# Patient Record
Sex: Male | Born: 1950 | Race: Black or African American | Hispanic: No | Marital: Married | Smoking: Former smoker
Health system: Southern US, Community
[De-identification: ages and names within clinical notes are randomized; demographics above are authoritative.]

## PROBLEM LIST (undated history)

## (undated) DIAGNOSIS — N529 Male erectile dysfunction, unspecified: Secondary | ICD-10-CM

## (undated) DIAGNOSIS — T4145XA Adverse effect of unspecified anesthetic, initial encounter: Secondary | ICD-10-CM

## (undated) DIAGNOSIS — J4520 Mild intermittent asthma, uncomplicated: Secondary | ICD-10-CM

## (undated) DIAGNOSIS — K219 Gastro-esophageal reflux disease without esophagitis: Secondary | ICD-10-CM

## (undated) DIAGNOSIS — R7989 Other specified abnormal findings of blood chemistry: Secondary | ICD-10-CM

## (undated) DIAGNOSIS — N400 Enlarged prostate without lower urinary tract symptoms: Secondary | ICD-10-CM

## (undated) DIAGNOSIS — R6882 Decreased libido: Secondary | ICD-10-CM

## (undated) DIAGNOSIS — J329 Chronic sinusitis, unspecified: Secondary | ICD-10-CM

## (undated) DIAGNOSIS — R319 Hematuria, unspecified: Secondary | ICD-10-CM

## (undated) DIAGNOSIS — K4090 Unilateral inguinal hernia, without obstruction or gangrene, not specified as recurrent: Secondary | ICD-10-CM

## (undated) DIAGNOSIS — J309 Allergic rhinitis, unspecified: Secondary | ICD-10-CM

## (undated) DIAGNOSIS — M169 Osteoarthritis of hip, unspecified: Secondary | ICD-10-CM

## (undated) DIAGNOSIS — D649 Anemia, unspecified: Secondary | ICD-10-CM

## (undated) DIAGNOSIS — R5383 Other fatigue: Secondary | ICD-10-CM

## (undated) DIAGNOSIS — J189 Pneumonia, unspecified organism: Secondary | ICD-10-CM

## (undated) DIAGNOSIS — I82409 Acute embolism and thrombosis of unspecified deep veins of unspecified lower extremity: Secondary | ICD-10-CM

## (undated) DIAGNOSIS — N189 Chronic kidney disease, unspecified: Secondary | ICD-10-CM

## (undated) DIAGNOSIS — K589 Irritable bowel syndrome without diarrhea: Secondary | ICD-10-CM

## (undated) DIAGNOSIS — E291 Testicular hypofunction: Secondary | ICD-10-CM

## (undated) DIAGNOSIS — E876 Hypokalemia: Secondary | ICD-10-CM

## (undated) DIAGNOSIS — IMO0001 Reserved for inherently not codable concepts without codable children: Secondary | ICD-10-CM

## (undated) DIAGNOSIS — B029 Zoster without complications: Secondary | ICD-10-CM

## (undated) DIAGNOSIS — M545 Low back pain, unspecified: Secondary | ICD-10-CM

## (undated) DIAGNOSIS — T8859XA Other complications of anesthesia, initial encounter: Secondary | ICD-10-CM

## (undated) DIAGNOSIS — I10 Essential (primary) hypertension: Secondary | ICD-10-CM

## (undated) DIAGNOSIS — G43909 Migraine, unspecified, not intractable, without status migrainosus: Secondary | ICD-10-CM

## (undated) HISTORY — DX: Hematuria, unspecified: R31.9

## (undated) HISTORY — DX: Other fatigue: R53.83

## (undated) HISTORY — DX: Male erectile dysfunction, unspecified: N52.9

## (undated) HISTORY — DX: Reserved for inherently not codable concepts without codable children: IMO0001

## (undated) HISTORY — DX: Irritable bowel syndrome, unspecified: K58.9

## (undated) HISTORY — DX: Zoster without complications: B02.9

## (undated) HISTORY — DX: Gastro-esophageal reflux disease without esophagitis: K21.9

## (undated) HISTORY — DX: Migraine, unspecified, not intractable, without status migrainosus: G43.909

## (undated) HISTORY — PX: SINUS EXPLORATION: SHX5214

## (undated) HISTORY — DX: Decreased libido: R68.82

## (undated) HISTORY — DX: Other specified abnormal findings of blood chemistry: R79.89

## (undated) HISTORY — PX: JOINT REPLACEMENT: SHX530

## (undated) HISTORY — DX: Allergic rhinitis, unspecified: J30.9

## (undated) HISTORY — DX: Osteoarthritis of hip, unspecified: M16.9

## (undated) HISTORY — DX: Essential (primary) hypertension: I10

## (undated) HISTORY — DX: Unilateral inguinal hernia, without obstruction or gangrene, not specified as recurrent: K40.90

## (undated) HISTORY — DX: Anemia, unspecified: D64.9

## (undated) HISTORY — DX: Chronic sinusitis, unspecified: J32.9

## (undated) HISTORY — DX: Low back pain, unspecified: M54.50

## (undated) HISTORY — DX: Low back pain: M54.5

## (undated) HISTORY — DX: Mild intermittent asthma, uncomplicated: J45.20

## (undated) HISTORY — DX: Testicular hypofunction: E29.1

## (undated) HISTORY — DX: Hypokalemia: E87.6

## (undated) HISTORY — DX: Benign prostatic hyperplasia without lower urinary tract symptoms: N40.0

---

## 1999-01-29 ENCOUNTER — Ambulatory Visit (HOSPITAL_COMMUNITY): Admission: RE | Admit: 1999-01-29 | Discharge: 1999-01-29 | Payer: Self-pay

## 2002-03-16 ENCOUNTER — Emergency Department (HOSPITAL_COMMUNITY): Admission: EM | Admit: 2002-03-16 | Discharge: 2002-03-17 | Payer: Self-pay | Admitting: Emergency Medicine

## 2002-03-16 ENCOUNTER — Encounter: Payer: Self-pay | Admitting: Emergency Medicine

## 2002-03-17 ENCOUNTER — Encounter: Payer: Self-pay | Admitting: Emergency Medicine

## 2002-03-18 ENCOUNTER — Ambulatory Visit (HOSPITAL_COMMUNITY): Admission: RE | Admit: 2002-03-18 | Discharge: 2002-03-18 | Payer: Self-pay | Admitting: *Deleted

## 2002-06-18 ENCOUNTER — Emergency Department (HOSPITAL_COMMUNITY): Admission: EM | Admit: 2002-06-18 | Discharge: 2002-06-18 | Payer: Self-pay

## 2002-06-18 ENCOUNTER — Encounter: Payer: Self-pay | Admitting: Emergency Medicine

## 2004-01-24 ENCOUNTER — Ambulatory Visit: Payer: Self-pay

## 2004-05-22 ENCOUNTER — Ambulatory Visit: Payer: Self-pay

## 2007-01-01 ENCOUNTER — Emergency Department (HOSPITAL_COMMUNITY): Admission: EM | Admit: 2007-01-01 | Discharge: 2007-01-02 | Payer: Self-pay | Admitting: Emergency Medicine

## 2007-01-06 ENCOUNTER — Emergency Department (HOSPITAL_COMMUNITY): Admission: EM | Admit: 2007-01-06 | Discharge: 2007-01-06 | Payer: Self-pay | Admitting: Family Medicine

## 2007-03-18 ENCOUNTER — Ambulatory Visit (HOSPITAL_BASED_OUTPATIENT_CLINIC_OR_DEPARTMENT_OTHER): Admission: RE | Admit: 2007-03-18 | Discharge: 2007-03-18 | Payer: Self-pay | Admitting: Orthopedic Surgery

## 2007-03-28 ENCOUNTER — Inpatient Hospital Stay (HOSPITAL_COMMUNITY): Admission: EM | Admit: 2007-03-28 | Discharge: 2007-04-02 | Payer: Self-pay | Admitting: Emergency Medicine

## 2007-03-29 ENCOUNTER — Ambulatory Visit: Payer: Self-pay | Admitting: Infectious Diseases

## 2007-03-30 ENCOUNTER — Encounter (INDEPENDENT_AMBULATORY_CARE_PROVIDER_SITE_OTHER): Payer: Self-pay | Admitting: Orthopedic Surgery

## 2007-03-30 ENCOUNTER — Ambulatory Visit: Payer: Self-pay | Admitting: Vascular Surgery

## 2008-04-24 ENCOUNTER — Ambulatory Visit: Payer: Self-pay | Admitting: Family Medicine

## 2008-07-28 ENCOUNTER — Ambulatory Visit: Payer: Self-pay | Admitting: Family Medicine

## 2009-10-06 HISTORY — PX: KNEE ARTHROSCOPY: SUR90

## 2010-05-21 NOTE — Op Note (Signed)
NAMEARLESS, VINEYARD NO.:  [id number]   MEDICAL RECORD NO.:  [id number]          PATIENT TYPE:  INP   LOCATION:  5006                         FACILITY:  [facility]   PHYSICIAN:  Feliberto Gottron. Turner Daniels, M.D.   DATE OF BIRTH:  08-Jan-1950   DATE OF PROCEDURE:  03/28/2007  DATE OF DISCHARGE:                               OPERATIVE REPORT   PREOPERATIVE DIAGNOSIS:  Large right knee hematoma status post  arthroscopic chondroplasties, meniscectomies and lateral release on  March 18, 2007, by Dr. Renae Fickle and Phineas Semen, P.A.-C.  Possible  infection.   POSTOPERATIVE DIAGNOSIS:  Large right knee hematoma status post  arthroscopic chondroplasties, meniscectomies and lateral release on  March 18, 2007, by Dr. Renae Fickle and Phineas Semen, P.A.-C.  Possible  infection.   PROCEDURE:  Right knee arthroscopic evacuation of large hematoma and  fairly complete synovectomy as well as removal of chondromalacia, medial  and lateral femoral condyle consistent with either a pyarthrosis or  possibly even osteomyelitis.   SURGEON:  Feliberto Gottron. Turner Daniels, M.D.   FIRST ASSISTANT:  Skip Mayer PA-C.   ANESTHETIC:  General endotracheal.   ESTIMATED BLOOD LOSS:  The hematoma itself was probably 100 mL.   TOURNIQUET TIME:  None.   SPECIMENS:  None.   INDICATIONS FOR PROCEDURE:  A 60 year old gentleman who underwent right  knee partial arthroscopic medial and lateral meniscectomies, debridement  of chondromalacia, chondroplasties, removal loose bodies, excision of  plicas and lateral release on March 18, 2007.  He presented on March 24, 2007, with a large swollen knee and a hematoma.  They attempted aspirate  it in the office and were able to get any fluid out.  He presented again  on Friday which would be March 26, 2007, again a large, swollen knee  with a hematoma and it was recommended he ice it down and used an Ace  wrap.  During the daytime on March 27, 2007, he spiked a temperature to  102,  presented to [employer] Emergency Room and I  ended up seeing him in the emergency room around 2300 hours on March 27, 2007.  Because of the level of pain he was having, we discussed the  possibility of lancing the hematoma and at least getting half of it out  in anticipation of arthroscopic decompression the next day.  The reason  for this thinking, is this was right in the middle of a gold trauma in  woman who had pretty much every long bone in her body fractured and this  was going to require attention for at least the next few hours.  Under  sterile conditions, I  went ahead and sterilely prepped the lateral  aspect of his knee and, using a #11 blade, created a 1 cm incision and I  was able to evacuate about 50% of the liquefied and clotted blood out of  the knee, giving him a great deal of pain relief.  When he presented to  the ER, his temperature was 101.7 and this was despite the use of  Tylenol.  At this point we admitted him for IV  antibiotics with  anticipation of an arthroscopic washout the next day.  In the emergency  room, he had about a 20 degree flexion contracture, probably secondary  to the blood in his knee.  Gram stain and cultures taken around midnight  on March 27, 2007, to the March 28, 2007, were pending at the time of  this dictation.  The risks and benefits of the surgery were discussed.  The plan was to get all the blood out and place large-bore Hemovac  drains and maintain him on vancomycin until the cultures return   DESCRIPTION OF PROCEDURE:  The patient was identified by armband, taken  the operating room at [facility].  Appropriate  anesthetic monitors were attached and general endotracheal anesthesia  was induced.  Lateral post applied to the table and the right lower  extremity prepped and draped usual sterile fashion from the ankle to the  midthigh and a time-out was performed.  The 1 cm lateral incision that   had been placed the night before was then explored manually and we  squeezed out another 100 mL of dark bloody, clotted material and then  recreated the inferomedial and inferolateral peripatellar portals,  inserted the arthroscope through the inferolateral portal and a 4.2  [place] su[place] shaver through the inferomedial portal or through the  lateral 1 cm incision.  Utilizing these portals, we then removed large  amounts of blood clot from the gutters from the articulation between the  femur and the tibia itself.  It should be noted that the cartilage  around the edges of the femur was the laminating and there is the little  bit of concern on my mind for either osteomyelitis or further  chondromalacia.  Clotted blood was also removed from around the cruciate  ligaments and what remained of the menisci and, most importantly, the  suprapatellar pouch was thoroughly cleaned out.  We ran 9 liters of  normal saline solution through the knee.  Utilizing the lateral incision  placed the night before, we then placed a through-and-through Hemovac  drain going from lateral to medial and using it as a double-armed medium  Hemovac drain.  Two vertical mattress sutures were placed laterally to  seal the drain and the arthroscopic instruments were removed.  A  dressing of Xeroform, 4x4 dressing sponges, Webril and an Ace wrap was  applied.  The patient was then awakened and taken to the recovery room  without difficulty.      Feliberto Gottron. Turner Daniels, M.D.  Electronically Signed     FJR/MEDQ  D:  03/28/2007  T:  03/28/2007  Job:  [id number]   cc:   Deidre Ala, M.D.

## 2010-05-21 NOTE — Discharge Summary (Signed)
NAMEDA, AUTHEMENT NO.:  [id number]   MEDICAL RECORD NO.:  [id number]          PATIENT TYPE:  INP   LOCATION:  5006                         FACILITY:  [facility]   PHYSICIAN:  [name], M.D.   DATE OF BIRTH:  June 11, 1950   DATE OF ADMISSION:  03/27/2007  DATE OF DISCHARGE:  04/02/2007                               DISCHARGE SUMMARY   FINAL DIAGNOSES:  1. Degenerative joint disease, right knee.  2. Hemarthrosis, right knee.  3. Blood loss, anemia.  4. Gastroesophageal reflux.   PROCEDURE:  Right knee arthroscopy with evacuation of large hematoma  with fairly complete synovectomy.   SURGEON:  [name], M.D.   HISTORY OF PRESENT ILLNESS:  This is a 60 year old African American male  who was recently followed by Dr. Renae Fickle.  We did an arthroscopy on him  approximately one week prior to this admission.  The patient did develop  an effusion.  Initially, we tried to aspirate and could not be aspirated  in the office secondary to being full of congealed blood.  This  continued to enlarge and he was seen by Dr. Turner Macdonald in the emergency room  after he presented to emergency room at [facility] for his  complaint of right knee pain.  He also has significant swelling.  Because of this, Dr. Turner Macdonald saw him in the hospital.  Initially, we made  a small incision over the lateral  retinaculum site and tried to  evacuate hematoma this way.  This could not be complete, so he was  subsequently taken to the OR, where he underwent arthroscopy on March 28, 2007.  At that time, he went and did a washout with fairly complete  synovectomy as well as removal of chondromalacia of mediolateral femoral  condyles consistent with either a polyarthrosis or possibly even  osteomyelitis.  Cultures were done of the wound cultures. Also, blood  cultures were performed.  His hospital course was uneventful.  He did  have Dopplers done in lower extremity to rule out DVT, which was  done.  These were negative.  The cultures remained negative throughout his  stay, and by day #5, it continued to remain negative, both the blood  cultures and wound cultures.  He did have some fevers, but these were  low-grade at 100.3.  It came down to between 98 and 99.5.  At the time  of discharge, it was about 99.5.  He was feeling well.  There is no  increased warmth of his knee at this time.  There is no significant  effusion noted.  The portal sites were healing well.  Peripheral pulse  intact.  Neuro's were grossly intact.  He had no calf pain, no Homans  sign.  Overall, he is doing well and at this time he is ready for  discharge.  He will be discharged.  He was given Dilaudid 4 mg to take 1-  2 by mouth every 4-6 hours as needed for pain, certainly he needs no  refills.  He was given  Procardia 5/325 to take 1 or 2 by mouth every 4-6 hours as  needed for  pain; for this he needs no refills.  The patient will follow up on  Wednesday, April 08, 2007 with Dr. Renae Fickle.  He is subsequently discharged  at this time in satisfactory, stable condition.      [name], P.A.      [name], M.D.  Electronically Signed    CL/MEDQ  D:  04/02/2007  T:  04/03/2007  Job:  [id number]

## 2010-05-24 NOTE — Op Note (Signed)
NAMEDONAVAN, KERLIN         ACCOUNT NO.:  [id number]   MEDICAL RECORD NO.:  [id number]          PATIENT TYPE:  AMB   LOCATION:  NESC                         FACILITY:  [facility]   PHYSICIAN:  Deidre Ala, M.D.    DATE OF BIRTH:  08/29/1950   DATE OF PROCEDURE:  DATE OF DISCHARGE:  03/18/2007                               OPERATIVE REPORT   PREOPERATIVE DIAGNOSES:  1. Degenerative medial and lateral meniscus tears.  2. Severe tricompartmental degenerative joint disease, grade III-IV.  3. Tight lateral retinaculum.  4. 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   POSTOPERATIVE DIAGNOSES:  1. Degenerative medial and lateral meniscus tears.  2. Severe tricompartmental degenerative joint disease, grade III-IV.  3. Tight lateral retinaculum.  4. 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   PROCEDURE:  1. Right knee arthroscopy with partial medial and lateral      meniscectomy.  2. Abrasion ablation chondroplasty tricompartmental.  3. With patella osteophyte removal.  4. Lateral retinacular release.  5. Synovectomy throughout.  6. Removal loose bodies.   SURGEON:  Jearld Adjutant, M.D.   ASSISTANT:  Phineas Semen, PA-C   ANESTHESIA:  General with LMA.   CULTURES:  None.   DRAINS:  None.   ESTIMATED BLOOD LOSS:  Minimal.   TOURNIQUET TIME:  1 hour 6 minutes.   PATHOLOGIC FINDINGS AND HISTORY:  [name] level 8-9  pain with his knee.  He has some degenerative change present in the  joint.  He was advised that he may need to have a total knee  replacement.  He did not want to go there, electing to proceed with knee  arthroscopy and a clean-out procedure with full understanding that this  may not completely resolve his symptoms.  At surgery we found  significant tricompartmental DJD, III-IV throughout, with marked  synovitis, a loose body that was somewhat bound down in the tissues just  anterior to the cruciates, and also marked osteophytes inferior  pole  patella, patellofemoral groove, with trochlear DJD, and a degenerative  medial and lateral meniscus tearing that was significant.  All of this  was debrided and smoothed, a lateral release carried out, synovectomy,  removal of the loose bodies and burring down of the osteophytes.   PROCEDURE:  With adequate anesthesia obtained using LMA technique, 1 g  Ancef given IV prophylaxis, the patient was placed in the supine  position.  The right lower extremity was prepped from the malleoli to  the legholder in the standard fashion.  After standard prepping and  draping, Esmarch examination was used,  the tourniquet was let up to 50  mmHg.  Superior lateral inflow portal was made, the knee was insufflated  with normal saline with the arthroscopic pump.  Medial and lateral scope  portals were then made and the joint was thoroughly inspected.  I then  shaved the medial plica back to the sidewall and lysed the medial band.  I then extensively debrided the trochlea and posterior patella and  removed osteophytes with a small bur and used the ablator on one to  smooth.  I then exposed the medial meniscus and used  basket and shaver  to saucerize to a stable rim, especially posteriorly, and sealed with  the ablator on 1 as well as smoothing the medial femoral condyle and  medial tibial plateau.  I then removed the loose body from an expanded  medial portal bound down in the notch.  After dissecting it out, I then  reversed portals and used basket and shaver to saucerize the inner rim  lateral meniscus, smoothed with the ablator on one, took out the lateral  plica, smoothed osteophytes on the lateral trochlea and lateral femoral  condyle.  I then did an arthroscopic lateral retinacular release, after  observing significant tilt and track and cauterized bleeding points.  I  then shaved out the superior pouch.  Ablator was further used to smooth.  The knee was then irrigated through the scope, 0.5%  Marcaine with  morphine was injected in and about the joint.  The portals were left  open.  A bulky sterile compressive dressing was applied with lateral  foam pad for tamponade and Ezy Wrap placed.  The patient then having  tolerated the procedure well was awakened and taken to the recovery room  in satisfactory condition to be discharged per outpatient routine, given  Percocet for pain, and told to call the office for recheck tomorrow.           ______________________________  V. Charlesetta Shanks, M.D.     VEP/MEDQ  D:  04/20/2007  T:  04/20/2007  Job:  [id number]

## 2010-05-24 NOTE — Op Note (Signed)
[name], [name] NO.:  [id number]   MEDICAL RECORD NO.:  [id number]                   PATIENT TYPE:  AMB   LOCATION:  ENDO                                 FACILITY:  [facility]   PHYSICIAN:  Sharyn Dross., M.D.               DATE OF BIRTH:  November 22, 1950   DATE OF PROCEDURE:  03/18/2002  DATE OF DISCHARGE:                                 OPERATIVE REPORT   PREOPERATIVE DIAGNOSIS:  Noncardiac chest pains, rule out secondary to  gastroesophageal reflux disease, rule out esophagitis, rule out other  causes.   POSTOPERATIVE DIAGNOSIS:  Normal endoscopic examination.   PROCEDURE:  Esophagogastroduodenoscopy.   MEDICATIONS:  Demerol 70 mg IV, Versed 6 mg IV over a 10-minute period of  time.   INSTRUMENT USED:  Olympus video panendoscope.   ENDOSCOPIST:  Dortha Kern, M.D.   SUBJECTIVE:  See the H&P accompanying the chart at this time.   OBJECTIVE:  GENERAL:  He is a pleasant gentleman in no distress.  VITAL SIGNS:  Stable.  HEENT:  Positive for arcus senilis.  NECK:  Supple.  CHEST:  The lungs were clear to auscultation and percussion.  CARDIAC:  The heart had a regular rate and rhythm without heaves, thrills,  murmurs, or gallops.  ABDOMEN:  Soft.  There was no tenderness to palpation, no hepatosplenomegaly  noted at this time.  Bowel sounds were normal.  EXTREMITIES:  No cyanosis or edema that is present.   PLAN:  I am going to proceed with the endoscopic examination, especially in  light of his discomfort at this time.  Depending upon these results will  determine the course of therapy.   INFORMED CONSENT:  The patient was advised of the procedure, the  indications, and the risks involved.  The patient has agreed to have the  procedure performed.  The video was reviewed and consent form obtained.   PREPROCEDURE PREPARATION:  The patient was brought to [facility]  endoscopy unit.  An IV for IV sedating medication was started.  A  monitor  was placed on the patient to monitor the patient's vital signs and oxygen  saturation.  Nasal oxygen at 2 L/min. was used and after adequate sedation  was performed, the procedure was begun.   PROCEDURE NOTE:  The instrument was advanced with the patient lying in the  left lateral position without difficulty.  The oropharyngeal, the glottis,  vocal cords, and piriform sinuses appear to be within normal limits.  The  esophagus was normal without any evidence of acute inflammation,  ulcerations, hiatal hernias, or varices appreciated.   The gastric area showed a normal mucus lake without any evidence of acute  inflammation or ulcerations that were noted.  The antral area appeared to be  normal, and the pylorus was normal with good peristaltic activity.  Upon  passing through the pyloric and out, the duodenal bulb and second portion  appeared to be  within normal limits.  The instrument was retracted back at  this time, and retroflexed view of the cardia showed no evidence of a hiatal  hernia that was noted.  The Z-line appeared to be 41 cm, distal esophagus.   The instrument was retracted back into the esophagus, where again no  evidence of any inflammatory changes was appreciated at this time.  The  instrument was subsequently removed per orum without difficulty.  The  patient tolerated the procedure well.   TREATMENT:  1. To continue present therapy on the patient without change at this time.  2. To follow up with me in two weeks to review the report and the results     and to see how he is clinically doing at this time.  3. Have given a sample prescription of Nexium 40 mg to take b.i.d. during     the interim.  I will hold him on this medicine until he returns in the     office and then discontinue the medicine at that time depending upon how     he is doing.                                               Sharyn Dross., M.D.    JM/MEDQ  D:  03/18/2002  T:  03/18/2002   Job:  [id number]

## 2010-05-24 NOTE — Op Note (Signed)
   NAMEREUEL, [name] NO.:  [id number]   MEDICAL RECORD NO.:  [id number]                   PATIENT TYPE:  AMB   LOCATION:  ENDO                                 FACILITY:  [facility]   PHYSICIAN:  Sharyn Dross., M.D.               DATE OF BIRTH:  1950/05/22   DATE OF PROCEDURE:  03/18/2002  DATE OF DISCHARGE:                                 OPERATIVE REPORT   ADDENDUM:   CURRENT MEDICATIONS:  1. Zocor 20 mg daily.  2. Norvasc 10 mg daily.  3. Sodium bicarb 650 mg tablets twice daily.  4. Glipizide 5 mg tablet daily.  5. Furosemide 40 mg, 1-1/2 tablets daily.  6. Aspirin 81 mg.  7. Calcitriol 0.5 mcg caplet.  8. Colchicine 0.6 mg one tablet twice a day.  9. Potassium chloride 20 mEq.  10.      Enalapril/hydrochlorothiazide 10/25 mg tablet daily.   ALLERGIES:  Noted to be none.   PAST SURGICAL HISTORY:  No surgeries over the last year.   To the transcriptionist:  I'm sorry.  Eliminate those medications.  Never  mind, this will be a cancelled note.                                                Sharyn Dross., M.D.    JM/MEDQ  D:  03/18/2002  T:  03/18/2002  Job:  [id number]

## 2010-09-30 LAB — CBC
HCT: 26.1 — ABNORMAL LOW
HCT: 29.3 — ABNORMAL LOW
Hemoglobin: 8.8 — ABNORMAL LOW
Hemoglobin: 9.7 — ABNORMAL LOW
Hemoglobin: 9.7 — ABNORMAL LOW
MCHC: 32.9
MCHC: 33.5
MCHC: 33.8
MCV: 83.1
MCV: 84
Platelets: 314
RBC: 3.16 — ABNORMAL LOW
RBC: 3.47 — ABNORMAL LOW
RDW: 14.1
RDW: 14.3
WBC: 7.4

## 2010-09-30 LAB — I-STAT 8, (EC8 V) (CONVERTED LAB)
Chloride: 100
Glucose, Bld: 118 — ABNORMAL HIGH
Hemoglobin: 10.9 — ABNORMAL LOW
Potassium: 3.5
Sodium: 136
TCO2: 30
pH, Ven: 7.421 — ABNORMAL HIGH

## 2010-09-30 LAB — COMPREHENSIVE METABOLIC PANEL
AST: 49 — ABNORMAL HIGH
Albumin: 3 — ABNORMAL LOW
Alkaline Phosphatase: 103
CO2: 30
Chloride: 99
GFR calc Af Amer: >60
GFR calc non Af Amer: >60
Potassium: 3.4 — ABNORMAL LOW
Total Bilirubin: 0.7

## 2010-09-30 LAB — BASIC METABOLIC PANEL
CO2: 30
Chloride: 100
Creatinine, Ser: 0.76
GFR calc Af Amer: >60

## 2010-09-30 LAB — DIFFERENTIAL
Basophils Absolute: 0.1
Basophils Relative: 1
Basophils Relative: 1
Eosinophils Absolute: 0.3
Eosinophils Relative: 4
Lymphocytes Relative: 18
Monocytes Absolute: 0.7
Monocytes Absolute: 1.2 — ABNORMAL HIGH
Monocytes Relative: 10
Neutro Abs: 3.6
Neutro Abs: 6.5
Neutrophils Relative %: 66

## 2010-09-30 LAB — CULTURE, BLOOD (ROUTINE X 2)
Culture: NO GROWTH
Culture: NO GROWTH

## 2010-09-30 LAB — WOUND CULTURE

## 2010-09-30 LAB — APTT: aPTT: 39 — ABNORMAL HIGH

## 2010-09-30 LAB — POCT HEMOGLOBIN-HEMACUE
Hemoglobin: 11.3 — ABNORMAL LOW
Operator id: 268271

## 2010-09-30 LAB — POCT I-STAT CREATININE: Operator id: 294341

## 2010-10-11 LAB — BASIC METABOLIC PANEL
CO2: 29
Calcium: 8.7
Creatinine, Ser: 0.88
GFR calc Af Amer: >60
Glucose, Bld: 122 — ABNORMAL HIGH

## 2010-10-11 LAB — CBC
HCT: 36.9 — ABNORMAL LOW
Hemoglobin: 12.1 — ABNORMAL LOW
MCHC: 32.9
MCV: 84.5
RBC: 4.36
RDW: 13.4

## 2012-02-07 HISTORY — PX: COLONOSCOPY: SHX174

## 2012-02-11 ENCOUNTER — Ambulatory Visit: Payer: Self-pay | Admitting: General Surgery

## 2012-02-11 LAB — HM COLONOSCOPY

## 2012-02-12 LAB — PATHOLOGY REPORT

## 2013-06-01 LAB — LIPID PANEL
Cholesterol: 192 mg/dL (ref 0–200)
HDL: 47 mg/dL (ref 35–70)
LDL CALC: 124 mg/dL

## 2013-06-01 LAB — PSA: PSA: 1.9

## 2014-07-11 ENCOUNTER — Other Ambulatory Visit: Payer: Self-pay | Admitting: Family Medicine

## 2014-07-25 ENCOUNTER — Other Ambulatory Visit: Payer: Self-pay | Admitting: Family Medicine

## 2014-09-13 ENCOUNTER — Ambulatory Visit: Payer: Self-pay | Admitting: Obstetrics and Gynecology

## 2014-09-20 ENCOUNTER — Encounter: Payer: Self-pay | Admitting: Obstetrics and Gynecology

## 2014-09-20 ENCOUNTER — Ambulatory Visit (INDEPENDENT_AMBULATORY_CARE_PROVIDER_SITE_OTHER): Payer: PRIVATE HEALTH INSURANCE | Admitting: Obstetrics and Gynecology

## 2014-09-20 VITALS — BP 146/93 | HR 80 | Resp 18 | Ht 74.0 in | Wt 293.1 lb

## 2014-09-20 DIAGNOSIS — N400 Enlarged prostate without lower urinary tract symptoms: Secondary | ICD-10-CM | POA: Diagnosis not present

## 2014-09-20 MED ORDER — TADALAFIL 5 MG PO TABS: 5.0000 mg | ORAL_TABLET | Freq: Every day | ORAL | Status: DC

## 2014-09-20 MED ORDER — FINASTERIDE 5 MG PO TABS: 5.0000 mg | ORAL_TABLET | Freq: Every day | ORAL | Status: DC

## 2014-09-20 NOTE — Progress Notes (Signed)
09/20/2014 3:31 PM   [name] [birth year] [id number]  Referring provider: No referring provider defined for this encounter.  Chief Complaint  Patient presents with  . Benign Prostatic Hypertrophy  . Elevated PSA    HPI: Patient is a 64 year old male with a history of BPH with LUTS, ED, and microscopic hematuria seen on UA last visit.  Last seen on 04/27/14. Patient presents today for follow-up of above complaints.  1. BPH with LUTS-Patient has been well managed on Cialis 5 mg daily for approximately 1.5 years. Finasteride was added at last visit. Last PVR 16 mL's. We will recheck DRE and PSA today. Patient reports significant improvement in urgency and some improvement in frequency since beginning finasteride.  PSA history  2/13 PSA 2.1 5/14 PSA 1.8  06/01/13 PSA 1.9  2. Microscopic hematuria-  3-10 RBCs seen on last UA. Will recheck today.  3. Erectile dysfunction- Patient reports that symptoms were somewhat improved by daily Cialis but that his libido has decreased since he was started on finasteride at last visit.  PMH: Past Medical History  Diagnosis Date  . Allergic rhinitis   . Chronic sinusitis   . Hypokalemia   . Anemia   . Unilateral inguinal hernia without obstruction or gangrene   . Mild intermittent asthma   . Hypogonadism in male   . ED (erectile dysfunction)   . Fatigue   . IBS (irritable bowel syndrome)   . Migraine   . Reflux   . Lumbago   . HTN (hypertension)   . Low serum vitamin D   . Decreased libido   . Shingles   . BPH (benign prostatic hyperplasia)   . Hematuria     Surgical History: Past Surgical History  Procedure Laterality Date  . Knee arthroscopy Left 10/06/2009  . Colonoscopy  02/2012    normal    Home Medications:    Medication List       This list is accurate as of: 09/20/14  3:31 PM.  Always use your most recent med list.               albuterol 108 (90 BASE) MCG/ACT inhaler  Commonly known as:   PROVENTIL HFA;VENTOLIN HFA  Inhale into the lungs every 6 (six) hours as needed for wheezing or shortness of breath.     amLODipine 5 MG tablet  Commonly known as:  NORVASC  Take 5 mg by mouth daily.     azelastine 0.05 % ophthalmic solution  Commonly known as:  OPTIVAR  1 drop 2 (two) times daily.     Ferrous Sulfate 134 MG Tabs  Take 2 tablets by mouth 2 (two) times daily.     fexofenadine 60 MG tablet  Commonly known as:  ALLEGRA  Take 60 mg by mouth 2 (two) times daily.     finasteride 5 MG tablet  Commonly known as:  PROSCAR  1 (ONE) TABLET, ORAL, ONE A DAY BY MOUTH     fluticasone 50 MCG/ACT nasal spray  Commonly known as:  FLONASE  Place 1 spray into both nostrils daily.     KLOR-CON M20 20 MEQ tablet  Generic drug:  potassium chloride SA  TAKE 2 TABLETS BY MOUTH 2 TIMES A DAY     losartan-hydrochlorothiazide 100-25 MG per tablet  Commonly known as:  HYZAAR  Take 1 tablet by mouth daily.     montelukast 10 MG tablet  Commonly known as:  SINGULAIR  TAKE 1 TABLET BY MOUTH DAILY  predniSONE 10 MG (21) Tbpk tablet  Commonly known as:  STERAPRED UNI-PAK 21 TAB  TAKE AS DIRECTED X 6 DAYS     tadalafil 5 MG tablet  Commonly known as:  CIALIS  Take 5 mg by mouth daily as needed for erectile dysfunction.        Allergies: No Known Allergies  Family History: Family History  Problem Relation Age of Onset  . Diabetes Mother   . Heart disease Mother   . Seizures Father   . Lung disease Mother     Social History:  reports that he has quit smoking. He does not have any smokeless tobacco history on file. He reports that he does not drink alcohol. His drug history is not on file.  ROS: UROLOGY Frequent Urination?: Yes Hard to postpone urination?: No Burning/pain with urination?: No Get up at night to urinate?: Yes Leakage of urine?: No Urine stream starts and stops?: No Trouble starting stream?: No Do you have to strain to urinate?: No Blood in urine?:  No Urinary tract infection?: No Sexually transmitted disease?: No Injury to kidneys or bladder?: No Painful intercourse?: No Weak stream?: No Erection problems?: No Penile pain?: No  Gastrointestinal Nausea?: No Vomiting?: No Indigestion/heartburn?: No Diarrhea?: No Constipation?: No  Constitutional Fever: No Night sweats?: No Weight loss?: No Fatigue?: Yes  Skin Skin rash/lesions?: No Itching?: No  Eyes Blurred vision?: No Double vision?: No  Ears/Nose/Throat Sore throat?: No Sinus problems?: Yes  Hematologic/Lymphatic Swollen glands?: No Easy bruising?: No  Cardiovascular Leg swelling?: No Chest pain?: No  Respiratory Cough?: Yes Shortness of breath?: No  Endocrine Excessive thirst?: No  Musculoskeletal Back pain?: Yes Joint pain?: Yes  Neurological Headaches?: No Dizziness?: No  Psychologic Depression?: No Anxiety?: No  Physical Exam: BP 146/93 mmHg  Pulse 80  Resp 18  Ht 6\' 2"  (1.88 m)  Wt 293 lb 1.6 oz (132.949 kg)  BMI 37.62 kg/m2  Constitutional:  Alert and oriented, No acute distress. HEENT:  AT, moist mucus membranes.  Trachea midline, no masses. Cardiovascular: No clubbing, cyanosis, or edema. Respiratory: Normal respiratory effort, no increased work of breathing.  Digital rectal exam: Prostate approximately +2 in size, smooth but exam limited due to patient's anatomy Skin: No rashes, bruises or suspicious lesions. Neurologic: Grossly intact, no focal deficits, moving all 4 extremities. Psychiatric: Normal mood and affect.  Laboratory Data: Lab Results  Component Value Date   WBC 6.8 04/01/2007   HGB 8.9* 04/01/2007   HCT 26.6* 04/01/2007   MCV 84.0 04/01/2007   PLT 439* 04/01/2007    Lab Results  Component Value Date   CREATININE 0.76 04/01/2007    No results found for: PSA  No results found for: TESTOSTERONE  No results found for: HGBA1C  Urinalysis No results found for: COLORURINE, APPEARANCEUR, LABSPEC,  PHURINE, GLUCOSEU, HGBUR, BILIRUBINUR, KETONESUR, PROTEINUR, UROBILINOGEN, NITRITE, LEUKOCYTESUR  Pertinent Imaging:  Assessment & Plan:    1. BPH (benign prostatic hyperplasia)- Patient's symptoms well controlled on daily 5 mg Cialis and finasteride. Will continue and follow up in 1 year or sooner if needed. - Urinalysis, Complete - PSA  2. Erectile dysfunction-Patient doing moderately well on Cialis 5 mg daily but states he has noticed a decrease in libido since starting finasteride. Cialis 20mg  samples provided today.   3. Microscopic hematuria- 3-10 RBCs seen on previous micro-urinalysis. No blood seen today.  We discussed the differential diagnosis for microscopic hematuria including nephrolithiasis, renal or upper tract tumors, bladder stones, UTIs, or bladder tumors  as well as undetermined etiologies. Per AUA guidelines, I did recommend complete microscopic hematuria evaluation including CTU, possible urine cytology, and office cystoscopy. Patient states understanding and is in agreement with plan to pursue CT urogram and cystoscopy. -Creatinine level   Return for schedule cystoscopy; f/u for Urogram results;recheck BPH 1 year.  Herbert Moors, [facility] [address], [place], [place] [address] [phone]

## 2014-09-21 LAB — CREATININE, SERUM
CREATININE: 0.86 mg/dL (ref 0.76–1.27)
GFR calc Af Amer: 107 mL/min/{1.73_m2} (ref >59)
GFR calc non Af Amer: 92 mL/min/{1.73_m2} (ref >59)

## 2014-09-21 LAB — URINALYSIS, COMPLETE
BILIRUBIN UA: NEGATIVE
GLUCOSE, UA: NEGATIVE
Ketones, UA: NEGATIVE
Leukocytes, UA: NEGATIVE
NITRITE UA: NEGATIVE
PROTEIN UA: NEGATIVE
RBC UA: NEGATIVE
Specific Gravity, UA: 1.02 (ref 1.005–1.030)
UUROB: 0.2 mg/dL (ref 0.2–1.0)
pH, UA: 8.5 — ABNORMAL HIGH (ref 5.0–7.5)

## 2014-09-21 LAB — MICROSCOPIC EXAMINATION
BACTERIA UA: NONE SEEN
Epithelial Cells (non renal): NONE SEEN /hpf (ref 0–10)
RBC, UA: NONE SEEN /hpf (ref 0–?)

## 2014-09-21 LAB — PSA: PROSTATE SPECIFIC AG, SERUM: 1.5 ng/mL (ref 0.0–4.0)

## 2014-09-25 ENCOUNTER — Telehealth: Payer: Self-pay

## 2014-09-25 NOTE — Telephone Encounter (Signed)
-----   Message from Roda Shutters, [place] sent at 09/22/2014  2:47 PM EDT ----- Please notify patient that his PSA level was 1.5 which is within normal limits and stable since his last check.

## 2014-09-25 NOTE — Telephone Encounter (Signed)
Spoke with pt wife in reference to lab results. Wife voiced understanding.  

## 2014-10-12 ENCOUNTER — Other Ambulatory Visit: Payer: Self-pay | Admitting: Family Medicine

## 2014-10-12 ENCOUNTER — Ambulatory Visit
Admission: RE | Admit: 2014-10-12 | Discharge: 2014-10-12 | Disposition: A | Discharge status: Home / Self Care | Payer: PRIVATE HEALTH INSURANCE | Source: Ambulatory Visit | Attending: Obstetrics and Gynecology | Admitting: Obstetrics and Gynecology

## 2014-10-12 DIAGNOSIS — K429 Umbilical hernia without obstruction or gangrene: Secondary | ICD-10-CM | POA: Insufficient documentation

## 2014-10-12 DIAGNOSIS — R310 Gross hematuria: Secondary | ICD-10-CM | POA: Insufficient documentation

## 2014-10-12 DIAGNOSIS — N400 Enlarged prostate without lower urinary tract symptoms: Secondary | ICD-10-CM

## 2014-10-12 DIAGNOSIS — K4090 Unilateral inguinal hernia, without obstruction or gangrene, not specified as recurrent: Secondary | ICD-10-CM | POA: Diagnosis not present

## 2014-10-12 MED ORDER — IOHEXOL 300 MG/ML  SOLN
125.0000 mL | Freq: Once | INTRAMUSCULAR | Status: AC | PRN
  Administered 2014-10-12: 125 mL via INTRAVENOUS

## 2014-10-19 ENCOUNTER — Encounter: Payer: Self-pay | Admitting: Urology

## 2014-10-19 ENCOUNTER — Ambulatory Visit (INDEPENDENT_AMBULATORY_CARE_PROVIDER_SITE_OTHER): Payer: PRIVATE HEALTH INSURANCE | Admitting: Urology

## 2014-10-19 VITALS — BP 151/94 | HR 84 | Ht 74.0 in | Wt 285.2 lb

## 2014-10-19 DIAGNOSIS — R3129 Other microscopic hematuria: Secondary | ICD-10-CM | POA: Diagnosis not present

## 2014-10-19 LAB — URINALYSIS, COMPLETE
Bilirubin, UA: NEGATIVE
GLUCOSE, UA: NEGATIVE
KETONES UA: NEGATIVE
Leukocytes, UA: NEGATIVE
NITRITE UA: NEGATIVE
SPEC GRAV UA: 1.02 (ref 1.005–1.030)
UUROB: 0.2 mg/dL (ref 0.2–1.0)
pH, UA: 7.5 (ref 5.0–7.5)

## 2014-10-19 LAB — MICROSCOPIC EXAMINATION: Renal Epithel, UA: NONE SEEN /hpf

## 2014-10-19 MED ORDER — LIDOCAINE HCL 2 % EX GEL
1.0000 "application " | Freq: Once | CUTANEOUS | Status: AC
  Administered 2014-10-19: 1 via URETHRAL

## 2014-10-19 MED ORDER — CIPROFLOXACIN HCL 500 MG PO TABS
500.0000 mg | ORAL_TABLET | Freq: Once | ORAL | Status: AC
  Administered 2014-10-19: 500 mg via ORAL

## 2014-10-19 NOTE — Progress Notes (Signed)
    Cystoscopy Procedure Note  Patient identification was confirmed, informed consent was obtained, and patient was prepped using Betadine solution.  Lidocaine jelly was administered per urethral meatus.    Preoperative abx where received prior to procedure.     Pre-Procedure: - Inspection reveals a normal caliber ureteral meatus.  Procedure: The flexible cystoscope was introduced without difficulty - No urethral strictures/lesions are present. - Enlarged prostate  - Normal bladder neck - Bilateral ureteral orifices identified - Bladder mucosa  reveals no ulcers, tumors, or lesions - No bladder stones - No trabeculation  Retroflexion shows intravesical lobe of prostate   Post-Procedure: - Patient tolerated the procedure well   A/P:  1. Microscopic hematuria -Negative workup -Follow-up in one year for repeat UA  2. BPH Continue Cialis 5 mg and finasteride   3. Erectile dysfunction Continue Cialis 5 mg  Follow up: One year with repeat urinalysis and discussion of his BPH and erectile dysfunction symptoms

## 2014-10-19 NOTE — Addendum Note (Signed)
Addended by: Kerry Hough on: 10/19/2014 09:40 AM   Modules accepted: Level of Service

## 2014-10-20 ENCOUNTER — Other Ambulatory Visit: Payer: Self-pay | Admitting: Family Medicine

## 2014-10-21 ENCOUNTER — Other Ambulatory Visit: Payer: Self-pay | Admitting: Family Medicine

## 2014-10-24 ENCOUNTER — Telehealth: Payer: Self-pay | Admitting: Family Medicine

## 2014-10-24 NOTE — Telephone Encounter (Signed)
Pt needs refills on Amlodipine to be sent to [facility]. Pt is completely out. Pt has a CPE 10/31/14

## 2014-10-25 NOTE — Telephone Encounter (Signed)
Script sent on 10/20/14

## 2014-10-30 ENCOUNTER — Telehealth: Payer: Self-pay | Admitting: Family Medicine

## 2014-10-30 DIAGNOSIS — Z0000 Encounter for general adult medical examination without abnormal findings: Secondary | ICD-10-CM

## 2014-10-30 NOTE — Telephone Encounter (Signed)
Patient has his annual physical scheduled for in the morning, he is requesting that you print off his lab order so he can get it done before appointment time which is 11:15a

## 2014-10-31 ENCOUNTER — Encounter: Payer: Self-pay | Admitting: Family Medicine

## 2014-10-31 ENCOUNTER — Ambulatory Visit (INDEPENDENT_AMBULATORY_CARE_PROVIDER_SITE_OTHER): Payer: PRIVATE HEALTH INSURANCE | Admitting: Family Medicine

## 2014-10-31 VITALS — BP 138/88 | HR 86 | Temp 98.8°F | Resp 16 | Ht 74.0 in | Wt 285.4 lb

## 2014-10-31 DIAGNOSIS — Z23 Encounter for immunization: Secondary | ICD-10-CM | POA: Diagnosis not present

## 2014-10-31 DIAGNOSIS — Z0000 Encounter for general adult medical examination without abnormal findings: Secondary | ICD-10-CM | POA: Diagnosis not present

## 2014-10-31 NOTE — Progress Notes (Signed)
Name: [name]   MRN: [id number]    DOB: 01-10-1950   Date:10/31/2014       Progress Note  Subjective  Chief Complaint  Chief Complaint  Patient presents with  . Annual Exam    HPI  64 year old presenting for annual H&P. Baseline problems are stable  Past Medical History  Diagnosis Date  . Allergic rhinitis   . Chronic sinusitis   . Hypokalemia   . Anemia   . Unilateral inguinal hernia without obstruction or gangrene   . Mild intermittent asthma   . Hypogonadism in male   . ED (erectile dysfunction)   . Fatigue   . IBS (irritable bowel syndrome)   . Migraine   . Reflux   . Lumbago   . HTN (hypertension)   . Low serum vitamin D   . Decreased libido   . Shingles   . BPH (benign prostatic hyperplasia)   . Hematuria     Social History  Substance Use Topics  . Smoking status: Former Research scientist (life sciences)  . Smokeless tobacco: Not on file  . Alcohol Use: No     Current outpatient prescriptions:  .  albuterol (PROVENTIL HFA;VENTOLIN HFA) 108 (90 BASE) MCG/ACT inhaler, Inhale into the lungs every 6 (six) hours as needed for wheezing or shortness of breath., Disp: , Rfl:  .  amLODipine (NORVASC) 5 MG tablet, TAKE 1 TABLET BY MOUTH EVERY DAY, Disp: 30 tablet, Rfl: 0 .  azelastine (OPTIVAR) 0.05 % ophthalmic solution, 1 drop 2 (two) times daily., Disp: , Rfl:  .  Ferrous Sulfate 134 MG TABS, Take 2 tablets by mouth 2 (two) times daily., Disp: , Rfl:  .  fexofenadine (ALLEGRA) 60 MG tablet, Take 60 mg by mouth 2 (two) times daily., Disp: , Rfl:  .  finasteride (PROSCAR) 5 MG tablet, Take 1 tablet (5 mg total) by mouth daily., Disp: 30 tablet, Rfl: 12 .  fluticasone (FLONASE) 50 MCG/ACT nasal spray, Place 1 spray into both nostrils daily., Disp: , Rfl:  .  KLOR-CON M20 20 MEQ tablet, TAKE 2 TABLETS BY MOUTH 2 TIMES A DAY, Disp: 120 tablet, Rfl: 5 .  losartan-hydrochlorothiazide (HYZAAR) 100-25 MG per tablet, Take 1 tablet by mouth daily., Disp: , Rfl:  .  metaxalone  (SKELAXIN) 800 MG tablet, TAKE 1 TABLET BY MOUTH 2-3 TIMES A DAY AS NEEDED PAIN/SPASMS, Disp: , Rfl: 2 .  montelukast (SINGULAIR) 10 MG tablet, TAKE 1 TABLET BY MOUTH DAILY, Disp: 30 tablet, Rfl: 2 .  predniSONE (STERAPRED UNI-PAK 21 TAB) 5 MG (21) TBPK tablet, TAKE AS DIRECTED X 6 DAYS AFTER COMPLETING 10 MG (DISPENSE BOTH PACKS), Disp: , Rfl: 0 .  tadalafil (CIALIS) 5 MG tablet, Take 1 tablet (5 mg total) by mouth daily., Disp: 90 tablet, Rfl: 3  No Known Allergies  Review of Systems  Constitutional: Negative for fever, chills and weight loss.  HENT: Negative for congestion, hearing loss, sore throat and tinnitus.   Eyes: Negative for blurred vision, double vision and redness.  Respiratory: Negative for cough, hemoptysis and shortness of breath.   Cardiovascular: Negative for chest pain, palpitations, orthopnea, claudication and leg swelling.  Gastrointestinal: Negative for heartburn, nausea, vomiting, diarrhea, constipation and blood in stool.  Genitourinary: Negative for dysuria, urgency, frequency and hematuria.  Musculoskeletal: Negative for myalgias, back pain, joint pain, falls and neck pain.  Skin: Negative for itching.  Neurological: Positive for headaches. Negative for dizziness, tingling, tremors, focal weakness, seizures, loss of consciousness and weakness.  Endo/Heme/Allergies: Does not  bruise/bleed easily.  Psychiatric/Behavioral: Negative for depression and substance abuse. The patient is not nervous/anxious and does not have insomnia.      Objective  Filed Vitals:   10/31/14 1111  BP: 138/88  Pulse: 86  Temp: 98.8 F (37.1 C)  Resp: 16  Height: 6\' 2"  (1.88 m)  Weight: 285 lb 7 oz (129.474 kg)  SpO2: 96%     Physical Exam  Constitutional: He is oriented to person, place, and time and well-developed, well-nourished, and in no distress.  HENT:  Head: Normocephalic.  Eyes: EOM are normal. Pupils are equal, round, and reactive to light.  Neck: Normal range of  motion. Neck supple. No thyromegaly present.  Cardiovascular: Normal rate, regular rhythm and normal heart sounds.   No murmur heard. Pulmonary/Chest: Effort normal and breath sounds normal. No respiratory distress. He has no wheezes.  Abdominal: Soft. Bowel sounds are normal.  Genitourinary: Rectum normal.  Done by urologist a few months ago  Musculoskeletal: Normal range of motion. He exhibits no edema.  Lymphadenopathy:    He has no cervical adenopathy.  Neurological: He is alert and oriented to person, place, and time. No cranial nerve deficit. Gait normal. Coordination normal.  Skin: Skin is warm and dry. No rash noted.  Psychiatric: Affect and judgment normal.      Assessment & Plan  1. Need for influenza vaccination Given today - Flu Vaccine QUAD 36+ mos PF IM (Fluarix & Fluzone Quad PF)  2. Annual physical exam Annual labs

## 2014-10-31 NOTE — Telephone Encounter (Signed)
Patient informed. 

## 2014-10-31 NOTE — Telephone Encounter (Signed)
Lab slip printed and placed in file cabinet for pick up

## 2014-11-01 LAB — COMPREHENSIVE METABOLIC PANEL
ALBUMIN: 4.1 g/dL (ref 3.6–4.8)
ALK PHOS: 96 IU/L (ref 39–117)
ALT: 33 IU/L (ref 0–44)
AST: 30 IU/L (ref 0–40)
Albumin/Globulin Ratio: 1.4 (ref 1.1–2.5)
BILIRUBIN TOTAL: 0.5 mg/dL (ref 0.0–1.2)
BUN/Creatinine Ratio: 20 (ref 10–22)
BUN: 17 mg/dL (ref 8–27)
CHLORIDE: 98 mmol/L (ref 97–106)
CO2: 30 mmol/L — ABNORMAL HIGH (ref 18–29)
Calcium: 9.3 mg/dL (ref 8.6–10.2)
Creatinine, Ser: 0.85 mg/dL (ref 0.76–1.27)
GFR calc Af Amer: 107 mL/min/{1.73_m2} (ref >59)
GFR calc non Af Amer: 93 mL/min/{1.73_m2} (ref >59)
GLUCOSE: 102 mg/dL — AB (ref 65–99)
Globulin, Total: 3 g/dL (ref 1.5–4.5)
POTASSIUM: 2.9 mmol/L — AB (ref 3.5–5.2)
Sodium: 146 mmol/L — ABNORMAL HIGH (ref 136–144)
Total Protein: 7.1 g/dL (ref 6.0–8.5)

## 2014-11-01 LAB — LIPID PANEL
CHOLESTEROL TOTAL: 175 mg/dL (ref 100–199)
Chol/HDL Ratio: 4.5 ratio units (ref 0.0–5.0)
HDL: 39 mg/dL — AB (ref >39)
LDL Calculated: 116 mg/dL — ABNORMAL HIGH (ref 0–99)
TRIGLYCERIDES: 99 mg/dL (ref 0–149)
VLDL Cholesterol Cal: 20 mg/dL (ref 5–40)

## 2014-11-01 LAB — TSH: TSH: 3.33 u[IU]/mL (ref 0.450–4.500)

## 2014-11-07 ENCOUNTER — Telehealth: Payer: Self-pay | Admitting: Emergency Medicine

## 2014-11-07 NOTE — Telephone Encounter (Signed)
Spoke to patient wife. He is already on Potassium 13meq twice a day. Not sure if he is taking as prescribed. Will discuss with Mr. Tessler and call back.

## 2014-11-23 ENCOUNTER — Telehealth: Payer: Self-pay | Admitting: Family Medicine

## 2014-11-23 ENCOUNTER — Other Ambulatory Visit: Payer: Self-pay | Admitting: Family Medicine

## 2014-11-23 MED ORDER — MONTELUKAST SODIUM 10 MG PO TABS: 10.0000 mg | ORAL_TABLET | Freq: Every day | ORAL | Status: DC

## 2014-11-23 MED ORDER — LOSARTAN POTASSIUM-HCTZ 100-25 MG PO TABS: 1.0000 | ORAL_TABLET | Freq: Every day | ORAL | Status: DC

## 2014-11-23 NOTE — Telephone Encounter (Signed)
Scripts sent to [facility]

## 2014-11-23 NOTE — Telephone Encounter (Signed)
Requesting refill on Amlodipine (completely out), losartan, montelukast, and klor-con. Please send all [facility] [phone]. He is wanting a 52m supply for all of them.

## 2015-01-25 ENCOUNTER — Telehealth: Payer: Self-pay

## 2015-01-25 NOTE — Telephone Encounter (Signed)
PA for cialis 5mg  has been approved. Pt has been made aware.

## 2015-02-05 ENCOUNTER — Other Ambulatory Visit: Payer: Self-pay | Admitting: Family Medicine

## 2015-03-30 ENCOUNTER — Other Ambulatory Visit: Payer: Self-pay | Admitting: Family Medicine

## 2015-03-30 MED ORDER — AZELASTINE HCL 0.05 % OP SOLN: 1.0000 [drp] | Freq: Two times a day (BID) | OPHTHALMIC | Status: DC

## 2015-03-30 MED ORDER — FLUTICASONE PROPIONATE 50 MCG/ACT NA SUSP: 1.0000 | Freq: Every day | NASAL | Status: DC

## 2015-04-27 ENCOUNTER — Other Ambulatory Visit: Payer: Self-pay | Admitting: Orthopedic Surgery

## 2015-04-27 DIAGNOSIS — M5416 Radiculopathy, lumbar region: Secondary | ICD-10-CM

## 2015-05-03 ENCOUNTER — Ambulatory Visit
Admission: RE | Admit: 2015-05-03 | Discharge: 2015-05-03 | Disposition: A | Discharge status: Home / Self Care | Payer: PRIVATE HEALTH INSURANCE | Source: Ambulatory Visit | Attending: Orthopedic Surgery | Admitting: Orthopedic Surgery

## 2015-05-03 ENCOUNTER — Ambulatory Visit: Payer: PRIVATE HEALTH INSURANCE | Admitting: Family Medicine

## 2015-05-03 DIAGNOSIS — M5416 Radiculopathy, lumbar region: Secondary | ICD-10-CM

## 2015-05-21 ENCOUNTER — Other Ambulatory Visit: Payer: Self-pay | Admitting: Family Medicine

## 2015-05-22 ENCOUNTER — Telehealth: Payer: Self-pay | Admitting: Family Medicine

## 2015-05-22 NOTE — Telephone Encounter (Signed)
Requesting refill on losartan, amlodipine and generic for singular. Please send to [facility]. He only have 2 pills left and has scheduled appointment with Dr Ancil Boozer

## 2015-05-23 ENCOUNTER — Other Ambulatory Visit: Payer: Self-pay

## 2015-05-23 MED ORDER — LOSARTAN POTASSIUM-HCTZ 100-25 MG PO TABS: 1.0000 | ORAL_TABLET | Freq: Every day | ORAL | Status: DC

## 2015-05-23 MED ORDER — AMLODIPINE BESYLATE 5 MG PO TABS: 5.0000 mg | ORAL_TABLET | Freq: Every day | ORAL | Status: DC

## 2015-05-24 ENCOUNTER — Other Ambulatory Visit: Payer: Self-pay | Admitting: Emergency Medicine

## 2015-05-24 MED ORDER — MONTELUKAST SODIUM 10 MG PO TABS: 10.0000 mg | ORAL_TABLET | Freq: Every day | ORAL | Status: DC

## 2015-06-20 ENCOUNTER — Ambulatory Visit (INDEPENDENT_AMBULATORY_CARE_PROVIDER_SITE_OTHER): Payer: PRIVATE HEALTH INSURANCE | Admitting: Family Medicine

## 2015-06-20 ENCOUNTER — Encounter: Payer: Self-pay | Admitting: Family Medicine

## 2015-06-20 VITALS — HR 88 | Temp 98.5°F | Resp 16 | Wt 294.8 lb

## 2015-06-20 DIAGNOSIS — M170 Bilateral primary osteoarthritis of knee: Secondary | ICD-10-CM | POA: Diagnosis not present

## 2015-06-20 DIAGNOSIS — E291 Testicular hypofunction: Secondary | ICD-10-CM | POA: Insufficient documentation

## 2015-06-20 DIAGNOSIS — J302 Other seasonal allergic rhinitis: Secondary | ICD-10-CM

## 2015-06-20 DIAGNOSIS — J4531 Mild persistent asthma with (acute) exacerbation: Secondary | ICD-10-CM

## 2015-06-20 DIAGNOSIS — J4540 Moderate persistent asthma, uncomplicated: Secondary | ICD-10-CM | POA: Insufficient documentation

## 2015-06-20 DIAGNOSIS — Z8619 Personal history of other infectious and parasitic diseases: Secondary | ICD-10-CM | POA: Insufficient documentation

## 2015-06-20 DIAGNOSIS — M5416 Radiculopathy, lumbar region: Secondary | ICD-10-CM

## 2015-06-20 DIAGNOSIS — D638 Anemia in other chronic diseases classified elsewhere: Secondary | ICD-10-CM | POA: Insufficient documentation

## 2015-06-20 DIAGNOSIS — K219 Gastro-esophageal reflux disease without esophagitis: Secondary | ICD-10-CM | POA: Insufficient documentation

## 2015-06-20 DIAGNOSIS — Z1159 Encounter for screening for other viral diseases: Secondary | ICD-10-CM | POA: Insufficient documentation

## 2015-06-20 DIAGNOSIS — E785 Hyperlipidemia, unspecified: Secondary | ICD-10-CM | POA: Diagnosis not present

## 2015-06-20 DIAGNOSIS — N400 Enlarged prostate without lower urinary tract symptoms: Secondary | ICD-10-CM | POA: Insufficient documentation

## 2015-06-20 DIAGNOSIS — G8929 Other chronic pain: Secondary | ICD-10-CM | POA: Insufficient documentation

## 2015-06-20 DIAGNOSIS — R739 Hyperglycemia, unspecified: Secondary | ICD-10-CM | POA: Diagnosis not present

## 2015-06-20 DIAGNOSIS — M5410 Radiculopathy, site unspecified: Secondary | ICD-10-CM | POA: Diagnosis not present

## 2015-06-20 DIAGNOSIS — Z114 Encounter for screening for human immunodeficiency virus [HIV]: Secondary | ICD-10-CM

## 2015-06-20 DIAGNOSIS — E876 Hypokalemia: Secondary | ICD-10-CM | POA: Diagnosis not present

## 2015-06-20 DIAGNOSIS — Z8669 Personal history of other diseases of the nervous system and sense organs: Secondary | ICD-10-CM | POA: Insufficient documentation

## 2015-06-20 DIAGNOSIS — I10 Essential (primary) hypertension: Secondary | ICD-10-CM | POA: Diagnosis not present

## 2015-06-20 DIAGNOSIS — D509 Iron deficiency anemia, unspecified: Secondary | ICD-10-CM | POA: Diagnosis not present

## 2015-06-20 DIAGNOSIS — G43009 Migraine without aura, not intractable, without status migrainosus: Secondary | ICD-10-CM | POA: Insufficient documentation

## 2015-06-20 DIAGNOSIS — N529 Male erectile dysfunction, unspecified: Secondary | ICD-10-CM | POA: Insufficient documentation

## 2015-06-20 MED ORDER — FLUTICASONE PROPIONATE 50 MCG/ACT NA SUSP: 2.0000 | Freq: Every day | NASAL | Status: DC

## 2015-06-20 MED ORDER — FLUTICASONE PROPIONATE 50 MCG/ACT NA SUSP: 1.0000 | Freq: Every day | NASAL | Status: DC

## 2015-06-20 MED ORDER — MONTELUKAST SODIUM 10 MG PO TABS: 10.0000 mg | ORAL_TABLET | Freq: Every day | ORAL | Status: DC

## 2015-06-20 MED ORDER — FLUTICASONE FUROATE-VILANTEROL 100-25 MCG/INH IN AEPB: 1.0000 | INHALATION_SPRAY | Freq: Every day | RESPIRATORY_TRACT | Status: DC

## 2015-06-20 MED ORDER — AZELASTINE HCL 0.05 % OP SOLN: 2.0000 [drp] | Freq: Two times a day (BID) | OPHTHALMIC | Status: DC

## 2015-06-20 MED ORDER — ACETAMINOPHEN 500 MG PO TABS: 500.0000 mg | ORAL_TABLET | Freq: Three times a day (TID) | ORAL | Status: DC | PRN

## 2015-06-20 MED ORDER — AMLODIPINE BESYLATE-VALSARTAN 5-320 MG PO TABS: 1.0000 | ORAL_TABLET | Freq: Every day | ORAL | Status: DC

## 2015-06-20 NOTE — Progress Notes (Signed)
Name: [name]   MRN: [id number]    DOB: September 11, 1950   Date:06/20/2015       Progress Note  Subjective  Chief Complaint  Chief Complaint  Patient presents with  . Medication Refill  . Immunizations    PCV    HPI  HTN: she is taking Losartan/HCTZ and Norvasc daily and bp is at goal, he has a long history of hypokalemia we will try adjusting the medication to see if HCTZ if causing hypokalemia  Hypokalemia: long history of hypokalemia, taking 40 meq or potassium twice daily to keep potassium at goal. He states he feels thirsty all the time and drinks about . He drinks over 100 ounces of water daily for the past 6 years.   Iron Deficiency anemia: he has been taking iron supplementation, last hgb in our system if from 2009 and it was down to 8.9. He has intermittently.   Dyslipidemia: low HDL , discussed how to increase HDL levels by change diet and exercise more  Asthma: used to smoke about 40 years and quit at age 13, he was diagnosed with asthma as an adult in his late 60's. He is not currently using any inhalers, but has noticed that over the past few weeks he has a dry cough, also coughing during the night, no associated wheezing or SOB. He denies orthopnea, but has noticed some mild leg swelling.   Hyperglycemia: previous elevated glucose, he has polydipsia but no polyphagia.  OA and chronic low back pian: seen ortho in [place], had steroid injection on his knees and will have right knee surgery next year.  He states back is better, radiculitis to legs has decreased with epidural injection   Patient Active Problem List   Diagnosis Date Noted  . BPH (benign prostatic hyperplasia) 06/20/2015  . ED (erectile dysfunction) 06/20/2015  . Allergic rhinitis, seasonal 06/20/2015  . Iron deficiency anemia 06/20/2015  . Hypogonadism in male 06/20/2015  . Asthma, mild persistent 06/20/2015  . Hypertension, benign 06/20/2015  . Hyperglycemia 06/20/2015  . History of  shingles 06/20/2015  . GERD without esophagitis 06/20/2015  . Chronic radicular low back pain 06/20/2015  . Primary osteoarthritis of both knees 06/20/2015    Past Surgical History  Procedure Laterality Date  . Knee arthroscopy Left 10/06/2009  . Colonoscopy  02/2012    normal    Family History  Problem Relation Age of Onset  . Diabetes Mother   . Heart disease Mother   . Seizures Father   . Lung disease Mother     Social History   Social History  . Marital Status: Married    Spouse Name: N/A  . Number of Children: N/A  . Years of Education: N/A   Occupational History  . Not on file.   Social History Main Topics  . Smoking status: Former Research scientist (life sciences)  . Smokeless tobacco: Never Used  . Alcohol Use: No  . Drug Use: No  . Sexual Activity:    Partners: Female   Other Topics Concern  . Not on file   Social History Narrative     Current outpatient prescriptions:  .  acetaminophen (TYLENOL) 500 MG tablet, Take 1 tablet (500 mg total) by mouth every 8 (eight) hours as needed., Disp: 90 tablet, Rfl: 0 .  albuterol (PROVENTIL HFA;VENTOLIN HFA) 108 (90 BASE) MCG/ACT inhaler, Inhale into the lungs every 6 (six) hours as needed for wheezing or shortness of breath., Disp: , Rfl:  .  amLODipine-valsartan (EXFORGE) 5-320 MG tablet,  Take 1 tablet by mouth daily., Disp: 30 tablet, Rfl: 0 .  azelastine (OPTIVAR) 0.05 % ophthalmic solution, Place 2 drops into both eyes 2 (two) times daily., Disp: 6 mL, Rfl: 5 .  cyclobenzaprine (FLEXERIL) 5 MG tablet, Take 5 mg by mouth at bedtime., Disp: , Rfl: 1 .  diclofenac (CATAFLAM) 50 MG tablet, Take 50 mg by mouth 2 (two) times daily with a meal., Disp: , Rfl: 3 .  Ferrous Sulfate 134 MG TABS, Take 2 tablets by mouth 2 (two) times daily., Disp: , Rfl:  .  fexofenadine (ALLEGRA) 60 MG tablet, Take 60 mg by mouth 2 (two) times daily., Disp: , Rfl:  .  finasteride (PROSCAR) 5 MG tablet, Take 1 tablet (5 mg total) by mouth daily., Disp: 30 tablet,  Rfl: 12 .  fluticasone (FLONASE) 50 MCG/ACT nasal spray, Place 2 sprays into both nostrils daily., Disp: 16 g, Rfl: 5 .  KLOR-CON M20 20 MEQ tablet, TAKE 2 TABLETS BY MOUTH TWICE A DAY, Disp: 120 tablet, Rfl: 5 .  metaxalone (SKELAXIN) 800 MG tablet, TAKE 1 TABLET BY MOUTH 2-3 TIMES A DAY AS NEEDED PAIN/SPASMS, Disp: , Rfl: 2 .  montelukast (SINGULAIR) 10 MG tablet, Take 1 tablet (10 mg total) by mouth daily., Disp: 30 tablet, Rfl: 5 .  tadalafil (CIALIS) 5 MG tablet, Take 1 tablet (5 mg total) by mouth daily., Disp: 90 tablet, Rfl: 3 .  traMADol (ULTRAM) 50 MG tablet, Take 50 mg by mouth every 8 (eight) hours as needed., Disp: , Rfl: 1  No Known Allergies   ROS  Constitutional: Negative for fever , positive for weight gain Respiratory: Positive  for cough , no  shortness of breath.   Cardiovascular: Negative for chest pain or palpitations.  Gastrointestinal: Negative for abdominal pain, no bowel changes.  Musculoskeletal: Positive  for gait problem and  joint swelling.  Skin: Negative for rash.  Neurological: Negative for dizziness or headache.  No other specific complaints in a complete review of systems (except as listed in HPI above). Objective  Filed Vitals:   06/20/15 1040  Pulse: 88  Temp: 98.5 F (36.9 C)  TempSrc: Oral  Resp: 16  Weight: 294 lb 12.8 oz (133.72 kg)  SpO2: 95%    Body mass index is 37.83 kg/(m^2).  Physical Exam  Constitutional: Patient appears well-developed and well-nourished. Obese  No distress.  HEENT: head atraumatic, normocephalic, pupils equal and reactive to light, neck supple, throat within normal limits Cardiovascular: Normal rate, regular rhythm and normal heart sounds.  No murmur heard.Trace  BLE edema. Pulmonary/Chest: Effort normal and breath sounds normal. No respiratory distress. Abdominal: Soft.  There is no tenderness. Psychiatric: Patient has a normal mood and affect. behavior is normal. Judgment and thought content  normal. Muscular Skeletal: crepitus with extension of both knees,  PHQ2/9: Depression screen [facility] 2/9 06/20/2015 10/31/2014  Decreased Interest 0 0  Down, Depressed, Hopeless 0 0  PHQ - 2 Score 0 0    Fall Risk: Fall Risk  06/20/2015  Falls in the past year? No    Functional Status Survey: Is the patient deaf or have difficulty hearing?: No Does the patient have difficulty seeing, even when wearing glasses/contacts?: No Does the patient have difficulty concentrating, remembering, or making decisions?: No Does the patient have difficulty walking or climbing stairs?: Yes (due to bilateral knee pain) Does the patient have difficulty dressing or bathing?: No Does the patient have difficulty doing errands alone such as visiting a doctor's office or  shopping?: No    Assessment & Plan   1. Asthma, mild persistent, with acute exacerbation  - montelukast (SINGULAIR) 10 MG tablet; Take 1 tablet (10 mg total) by mouth daily.  Dispense: 30 tablet; Refill: 5 - fluticasone furoate-vilanterol (BREO ELLIPTA) 100-25 MCG/INH AEPB; Inhale 1 puff into the lungs daily.  Dispense: 60 each; Refill: 0  2. Iron deficiency anemia  - CBC with Differential/Platelet - Iron - Iron and TIBC - Ferritin  3. Hypertension, benign  - amLODipine-valsartan (EXFORGE) 5-320 MG tablet; Take 1 tablet by mouth daily.  Dispense: 30 tablet; Refill: 0 Stop HCTZ because of hypokalemia  4. Hyperglycemia  - Hemoglobin A1c  5. Chronic radicular low back pain  Seeing Ortho  6. Primary osteoarthritis of both knees  - acetaminophen (TYLENOL) 500 MG tablet; Take 1 tablet (500 mg total) by mouth every 8 (eight) hours as needed.  Dispense: 90 tablet; Refill: 0  7. Hypokalemia  - Aldosterone + renin activity w/ ratio   8. Allergic rhinitis, seasonal  - montelukast (SINGULAIR) 10 MG tablet; Take 1 tablet (10 mg total) by mouth daily.  Dispense: 30 tablet; Refill: 5 - azelastine (OPTIVAR) 0.05 % ophthalmic  solution; Place 2 drops into both eyes 2 (two) times daily.  Dispense: 6 mL; Refill: 5 - fluticasone (FLONASE) 50 MCG/ACT nasal spray; Place 2 sprays into both nostrils daily.  Dispense: 16 g; Refill: 5  9. Screening for HIV (human immunodeficiency virus)  - HIV antibody  10. Need for hepatitis C screening test  - Hepatitis C antibody  11. Dyslipidemia  - Lipid panel

## 2015-06-21 ENCOUNTER — Other Ambulatory Visit: Payer: Self-pay | Admitting: Family Medicine

## 2015-06-21 DIAGNOSIS — I10 Essential (primary) hypertension: Secondary | ICD-10-CM

## 2015-06-22 ENCOUNTER — Encounter: Payer: Self-pay | Admitting: Family Medicine

## 2015-06-22 LAB — COMPREHENSIVE METABOLIC PANEL
A/G RATIO: 1.4 (ref 1.2–2.2)
ALT: 40 IU/L (ref 0–44)
AST: 38 IU/L (ref 0–40)
Albumin: 4.3 g/dL (ref 3.6–4.8)
Alkaline Phosphatase: 92 IU/L (ref 39–117)
BUN/Creatinine Ratio: 22 (ref 10–24)
BUN: 20 mg/dL (ref 8–27)
Bilirubin Total: 0.4 mg/dL (ref 0.0–1.2)
CHLORIDE: 95 mmol/L — AB (ref 96–106)
CO2: 33 mmol/L — ABNORMAL HIGH (ref 18–29)
Calcium: 9.8 mg/dL (ref 8.6–10.2)
Creatinine, Ser: 0.93 mg/dL (ref 0.76–1.27)
GFR calc Af Amer: 100 mL/min/{1.73_m2} (ref >59)
GFR calc non Af Amer: 86 mL/min/{1.73_m2} (ref >59)
GLOBULIN, TOTAL: 3.1 g/dL (ref 1.5–4.5)
Glucose: 106 mg/dL — ABNORMAL HIGH (ref 65–99)
POTASSIUM: 2.9 mmol/L — AB (ref 3.5–5.2)
SODIUM: 146 mmol/L — AB (ref 134–144)
Total Protein: 7.4 g/dL (ref 6.0–8.5)

## 2015-06-22 LAB — SPECIMEN STATUS REPORT

## 2015-06-26 ENCOUNTER — Other Ambulatory Visit: Payer: Self-pay | Admitting: Family Medicine

## 2015-06-26 ENCOUNTER — Other Ambulatory Visit: Payer: Self-pay

## 2015-06-26 DIAGNOSIS — E876 Hypokalemia: Secondary | ICD-10-CM

## 2015-06-26 DIAGNOSIS — R799 Abnormal finding of blood chemistry, unspecified: Secondary | ICD-10-CM

## 2015-06-26 DIAGNOSIS — R7989 Other specified abnormal findings of blood chemistry: Secondary | ICD-10-CM

## 2015-06-26 LAB — CBC WITH DIFFERENTIAL/PLATELET
BASOS ABS: 0 10*3/uL (ref 0.0–0.2)
BASOS: 0 %
EOS (ABSOLUTE): 0 10*3/uL (ref 0.0–0.4)
Eos: 0 %
Hematocrit: 36.3 % — ABNORMAL LOW (ref 37.5–51.0)
Hemoglobin: 12.3 g/dL — ABNORMAL LOW (ref 12.6–17.7)
IMMATURE GRANS (ABS): 0 10*3/uL (ref 0.0–0.1)
IMMATURE GRANULOCYTES: 0 %
LYMPHS: 30 %
Lymphocytes Absolute: 3.1 10*3/uL (ref 0.7–3.1)
MCH: 27.9 pg (ref 26.6–33.0)
MCHC: 33.9 g/dL (ref 31.5–35.7)
MCV: 82 fL (ref 79–97)
MONOS ABS: 0.7 10*3/uL (ref 0.1–0.9)
Monocytes: 6 %
NEUTROS PCT: 64 %
Neutrophils Absolute: 6.5 10*3/uL (ref 1.4–7.0)
PLATELETS: 258 10*3/uL (ref 150–379)
RBC: 4.41 x10E6/uL (ref 4.14–5.80)
RDW: 15.1 % (ref 12.3–15.4)
WBC: 10.2 10*3/uL (ref 3.4–10.8)

## 2015-06-26 LAB — LIPID PANEL
CHOL/HDL RATIO: 4 ratio (ref 0.0–5.0)
Cholesterol, Total: 201 mg/dL — ABNORMAL HIGH (ref 100–199)
HDL: 50 mg/dL (ref >39)
LDL CALC: 129 mg/dL — AB (ref 0–99)
Triglycerides: 111 mg/dL (ref 0–149)
VLDL CHOLESTEROL CAL: 22 mg/dL (ref 5–40)

## 2015-06-26 LAB — HEMOGLOBIN A1C
Est. average glucose Bld gHb Est-mCnc: 105 mg/dL
HEMOGLOBIN A1C: 5.3 % (ref 4.8–5.6)

## 2015-06-26 LAB — IRON AND TIBC
Iron Saturation: 27 % (ref 15–55)
Iron: 75 ug/dL (ref 38–169)
TIBC: 273 ug/dL (ref 250–450)
UIBC: 198 ug/dL (ref 111–343)

## 2015-06-26 LAB — HIV ANTIBODY (ROUTINE TESTING W REFLEX): HIV SCREEN 4TH GENERATION: NONREACTIVE

## 2015-06-26 LAB — ALDOSTERONE + RENIN ACTIVITY W/ RATIO: ALDOSTERONE: 8.4 ng/dL (ref 0.0–30.0)

## 2015-06-26 LAB — HEPATITIS C ANTIBODY

## 2015-06-26 LAB — FERRITIN: Ferritin: 290 ng/mL (ref 30–400)

## 2015-07-02 ENCOUNTER — Other Ambulatory Visit: Payer: Self-pay | Admitting: Family Medicine

## 2015-07-02 DIAGNOSIS — E876 Hypokalemia: Secondary | ICD-10-CM

## 2015-07-02 LAB — THYROID PANEL WITH TSH
Free Thyroxine Index: 2.3 (ref 1.2–4.9)
T3 UPTAKE RATIO: 28 % (ref 24–39)
T4 TOTAL: 8.2 ug/dL (ref 4.5–12.0)
TSH: 2.83 u[IU]/mL (ref 0.450–4.500)

## 2015-07-02 LAB — BASIC METABOLIC PANEL
BUN/Creatinine Ratio: 19 (ref 10–24)
BUN: 16 mg/dL (ref 8–27)
CALCIUM: 9.4 mg/dL (ref 8.6–10.2)
CHLORIDE: 97 mmol/L (ref 96–106)
CO2: 34 mmol/L — AB (ref 18–29)
Creatinine, Ser: 0.85 mg/dL (ref 0.76–1.27)
GFR calc Af Amer: 106 mL/min/{1.73_m2} (ref >59)
GFR calc non Af Amer: 92 mL/min/{1.73_m2} (ref >59)
Glucose: 94 mg/dL (ref 65–99)
POTASSIUM: 3.1 mmol/L — AB (ref 3.5–5.2)
Sodium: 146 mmol/L — ABNORMAL HIGH (ref 134–144)

## 2015-07-02 LAB — ALDOSTERONE + RENIN ACTIVITY W/ RATIO
ALDOS/RENIN RATIO: >17.4 (ref 0.0–30.0)
ALDOSTERONE: 2.9 ng/dL (ref 0.0–30.0)
Renin: <0.167 ng/mL/hr — ABNORMAL LOW (ref 0.167–5.380)

## 2015-07-03 ENCOUNTER — Other Ambulatory Visit: Payer: Self-pay | Admitting: Family Medicine

## 2015-07-03 DIAGNOSIS — E876 Hypokalemia: Secondary | ICD-10-CM

## 2015-07-04 ENCOUNTER — Other Ambulatory Visit: Payer: Self-pay | Admitting: Family Medicine

## 2015-07-04 DIAGNOSIS — E876 Hypokalemia: Secondary | ICD-10-CM

## 2015-07-04 DIAGNOSIS — I10 Essential (primary) hypertension: Secondary | ICD-10-CM

## 2015-07-04 LAB — POTASSIUM: POTASSIUM: 2.8 mmol/L — AB (ref 3.5–5.2)

## 2015-07-21 ENCOUNTER — Other Ambulatory Visit: Payer: Self-pay | Admitting: Family Medicine

## 2015-07-23 ENCOUNTER — Other Ambulatory Visit: Payer: Self-pay

## 2015-07-23 MED ORDER — AMLODIPINE BESYLATE-VALSARTAN 5-320 MG PO TABS: 1.0000 | ORAL_TABLET | Freq: Every day | ORAL | Status: DC

## 2015-07-23 NOTE — Telephone Encounter (Signed)
Refill request was sent to Dr. Krichna Sowles for approval and submission.  

## 2015-07-30 ENCOUNTER — Ambulatory Visit: Payer: PRIVATE HEALTH INSURANCE | Admitting: Family Medicine

## 2015-07-30 ENCOUNTER — Telehealth: Payer: Self-pay | Admitting: Family Medicine

## 2015-07-30 ENCOUNTER — Other Ambulatory Visit: Payer: Self-pay | Admitting: Family Medicine

## 2015-07-30 DIAGNOSIS — J4531 Mild persistent asthma with (acute) exacerbation: Secondary | ICD-10-CM

## 2015-07-30 MED ORDER — FLUTICASONE FUROATE-VILANTEROL 100-25 MCG/INH IN AEPB: 1.0000 | INHALATION_SPRAY | Freq: Every day | RESPIRATORY_TRACT | 0 refills | Status: DC

## 2015-07-30 MED ORDER — POTASSIUM CHLORIDE CRYS ER 20 MEQ PO TBCR: 40.0000 meq | EXTENDED_RELEASE_TABLET | Freq: Two times a day (BID) | ORAL | 0 refills | Status: DC

## 2015-07-30 NOTE — Telephone Encounter (Signed)
done

## 2015-07-31 ENCOUNTER — Other Ambulatory Visit: Payer: Self-pay

## 2015-07-31 DIAGNOSIS — J4531 Mild persistent asthma with (acute) exacerbation: Secondary | ICD-10-CM

## 2015-07-31 NOTE — Telephone Encounter (Signed)
Patient pharmacy is correct and is going to follow up with Endocrinology on August 31, 2015 and then 2 weeks after that following up with Dr. Holley Raring.

## 2015-07-31 NOTE — Telephone Encounter (Signed)
Patient requesting refill. 

## 2015-09-03 ENCOUNTER — Telehealth: Payer: Self-pay | Admitting: Family Medicine

## 2015-09-04 ENCOUNTER — Other Ambulatory Visit: Payer: Self-pay | Admitting: Family Medicine

## 2015-09-04 ENCOUNTER — Other Ambulatory Visit: Payer: Self-pay

## 2015-09-04 DIAGNOSIS — J4531 Mild persistent asthma with (acute) exacerbation: Secondary | ICD-10-CM

## 2015-09-04 MED ORDER — FLUTICASONE FUROATE-VILANTEROL 100-25 MCG/INH IN AEPB: 1.0000 | INHALATION_SPRAY | Freq: Every day | RESPIRATORY_TRACT | 0 refills | Status: DC

## 2015-09-04 MED ORDER — AMLODIPINE BESYLATE-VALSARTAN 5-320 MG PO TABS: 1.0000 | ORAL_TABLET | Freq: Every day | ORAL | 0 refills | Status: DC

## 2015-09-04 NOTE — Telephone Encounter (Signed)
Sent refills, I will review notes when he comes in

## 2015-09-04 NOTE — Telephone Encounter (Signed)
Please refill Breo Inhaler he only has 3 puffs left and uses inhaler daily, and [facility]

## 2015-09-04 NOTE — Telephone Encounter (Signed)
Patient was told until the blood work comes back he would not need to come back, but Dr. Gabriel Carina would sent her notes to Dr. Ancil Boozer and Dr. Holley Raring about patient conditions. Patient is awaiting to have answers about patient gland. But patient wife states he needs a refill of Breo until his upcoming appointment in September 2017 and his BP Medication Amlodipine and Valsartan.

## 2015-09-17 ENCOUNTER — Other Ambulatory Visit: Payer: Self-pay | Admitting: Family Medicine

## 2015-09-17 ENCOUNTER — Telehealth: Payer: Self-pay | Admitting: Family Medicine

## 2015-09-17 DIAGNOSIS — J302 Other seasonal allergic rhinitis: Secondary | ICD-10-CM

## 2015-09-17 MED ORDER — FLUTICASONE PROPIONATE 50 MCG/ACT NA SUSP: 2.0000 | Freq: Every day | NASAL | 5 refills | Status: DC

## 2015-09-17 NOTE — Telephone Encounter (Signed)
done

## 2015-10-01 ENCOUNTER — Other Ambulatory Visit: Payer: Self-pay | Admitting: Internal Medicine

## 2015-10-01 DIAGNOSIS — E269 Hyperaldosteronism, unspecified: Secondary | ICD-10-CM

## 2015-10-02 ENCOUNTER — Ambulatory Visit: Payer: PRIVATE HEALTH INSURANCE | Admitting: Family Medicine

## 2015-10-05 ENCOUNTER — Telehealth: Payer: Self-pay | Admitting: Family Medicine

## 2015-10-05 NOTE — Telephone Encounter (Signed)
Neoma Laming (wife) requesting return call. States husband was referred to Dr Rutherford Nail last year at [facility]. She would like to discuss his refills for Breo and Cialis.

## 2015-10-05 NOTE — Telephone Encounter (Signed)
Please call patient

## 2015-10-08 NOTE — Telephone Encounter (Signed)
Wife states [employer] stated he had to go initially to Urology for BPH Diagnosis from Dr. Lonia Skinner first. Patient is currently on Cialis and once he get the diagnosis then the PCP can then take over. Wife Neoma Laming wanted to know could you refill for patient since he does not need to go back to Dr. Rutherford Nail. Nephrologist Dr. Holley Raring is now sending patient for imaging to see if he is having a growth on his kidney gland on 8:15 a.m.Thursday. Please send in another prescription for [facility] Inhaler.

## 2015-10-08 NOTE — Telephone Encounter (Signed)
We need documentation for Cialis. He has an appointment this week and I can send all refills. Thank you

## 2015-10-09 NOTE — Telephone Encounter (Signed)
Pts wife states pt was seen at [facility] so we should be able to see documentation

## 2015-10-09 NOTE — Telephone Encounter (Signed)
Can he come in tomorrow?  

## 2015-10-09 NOTE — Telephone Encounter (Signed)
Spoke with patient wife due to patient needing refills of Memory Dance and states his 1 month F/U in July was cancelled due to going to other specialist and then was rescheduled in September for another follow up and cancelled this one. Patient wife states he is doing well with Breo and just needs refill and Dr. Ancil Boozer told him and his wife that he could come just every 6 months. Please advise since he is out of Breo.

## 2015-10-09 NOTE — Telephone Encounter (Signed)
He is scheduled for tomorrow at 2:20 pm

## 2015-10-10 ENCOUNTER — Encounter: Payer: Self-pay | Admitting: Family Medicine

## 2015-10-10 ENCOUNTER — Ambulatory Visit (INDEPENDENT_AMBULATORY_CARE_PROVIDER_SITE_OTHER): Payer: PRIVATE HEALTH INSURANCE | Admitting: Family Medicine

## 2015-10-10 VITALS — BP 134/76 | HR 92 | Temp 98.2°F | Resp 18 | Ht 74.0 in | Wt 299.5 lb

## 2015-10-10 DIAGNOSIS — I10 Essential (primary) hypertension: Secondary | ICD-10-CM

## 2015-10-10 DIAGNOSIS — M5416 Radiculopathy, lumbar region: Secondary | ICD-10-CM | POA: Diagnosis not present

## 2015-10-10 DIAGNOSIS — E785 Hyperlipidemia, unspecified: Secondary | ICD-10-CM

## 2015-10-10 DIAGNOSIS — E876 Hypokalemia: Secondary | ICD-10-CM | POA: Diagnosis not present

## 2015-10-10 DIAGNOSIS — N401 Enlarged prostate with lower urinary tract symptoms: Secondary | ICD-10-CM

## 2015-10-10 DIAGNOSIS — E2609 Other primary hyperaldosteronism: Secondary | ICD-10-CM

## 2015-10-10 DIAGNOSIS — G8929 Other chronic pain: Secondary | ICD-10-CM | POA: Diagnosis not present

## 2015-10-10 DIAGNOSIS — M170 Bilateral primary osteoarthritis of knee: Secondary | ICD-10-CM

## 2015-10-10 DIAGNOSIS — J4540 Moderate persistent asthma, uncomplicated: Secondary | ICD-10-CM | POA: Diagnosis not present

## 2015-10-10 DIAGNOSIS — N138 Other obstructive and reflux uropathy: Secondary | ICD-10-CM | POA: Diagnosis not present

## 2015-10-10 DIAGNOSIS — Z862 Personal history of diseases of the blood and blood-forming organs and certain disorders involving the immune mechanism: Secondary | ICD-10-CM

## 2015-10-10 DIAGNOSIS — Z23 Encounter for immunization: Secondary | ICD-10-CM

## 2015-10-10 MED ORDER — AMLODIPINE BESYLATE-VALSARTAN 5-320 MG PO TABS: 1.0000 | ORAL_TABLET | Freq: Every day | ORAL | 5 refills | Status: DC

## 2015-10-10 MED ORDER — TADALAFIL 5 MG PO TABS: 5.0000 mg | ORAL_TABLET | Freq: Every day | ORAL | 5 refills | Status: DC

## 2015-10-10 MED ORDER — FLUTICASONE FUROATE-VILANTEROL 100-25 MCG/INH IN AEPB: 2.0000 | INHALATION_SPRAY | Freq: Every day | RESPIRATORY_TRACT | 5 refills | Status: DC

## 2015-10-10 NOTE — Progress Notes (Signed)
Name: [name]   MRN: [id number]    DOB: February 26, 1950   Date:10/10/2015       Progress Note  Subjective  Chief Complaint  Chief Complaint  Patient presents with  . Medication Refill    4 month F/U  . Asthma    Well controlled with Breo  . Hypertension    Doing well with new dosage of Exforge along with Potassium-Aldesterone is kicking out of all the absorption of Potassium by Dr. Gabriel Carina.  . Allergic Rhinitis     Singular and Allegra D manages AR    HPI  HTN: he has a long history of hypokalemia, we stopped HCTZ back in June 2017, on Exforge and bp is at goal, he denies chest pain or SOB, getting evaluated by Dr. Holley Raring and Dr. Gabriel Carina for primary hyperaldosteronism.    Iron Deficiency anemia: he has been taking iron supplementation, last hgb in our system if from 2009 and it was down to 8.9, but was up to over 12 and also normal iron storage, he is off ferrous sulfate now. Likely anemia of chronic disease now. He has intermittently.   Dyslipidemia: low HDL , discussed how to increase HDL levels by change diet and exercise more  Asthma: used to smoke about 11 years  years and quit at age 9, he was diagnosed with asthma as an adult in his late 54's. We started him on Breo back in June and he states that cough and chest pain has resolved. Occasionally SOB with activity.  Hyperglycemia: previous elevated glucose, he has polydipsia but no polyphagia. Normal hgbA1C  OA both knees: seeing Dr. Berenice Primas and is now on Catflan twice daily for inflammation, he states that knee effusion has resolved, but explained that NSAID's can cause kidney disease, he will try going down to one daily   Low back pain with radiculitis, history of L2-L3 herniated disc disease, had epidural done by Dr. Mina Marble back in May 2017 and is feeling better, no longer has radiculitis, he feels much better  AR: on otc medication and singular and is doing well at this time, no nasal congestion or rhinorrhea  IPSS  Questionnaire (AUA-7): Over the past month.   1)  How often have you had a sensation of not emptying your bladder completely after you finish urinating?  4 - More than half the time  2)  How often have you had to urinate again less than two hours after you finished urinating? 4 - More than half the time  3)  How often have you found you stopped and started again several times when you urinated?  3 - About half the time  4) How difficult have you found it to postpone urination?  5 - Almost always  5) How often have you had a weak urinary stream?  3 - About half the time  6) How often have you had to push or strain to begin urination?  2 - Less than half the time  7) How many times did you most typically get up to urinate from the time you went to bed until the time you got up in the morning?  5 - 5+ times  Total score:  0-7 mildly symptomatic   8-19 moderately symptomatic   20-35 severely symptomatic    Patient Active Problem List   Diagnosis Date Noted  . History of iron deficiency anemia 10/10/2015  . BPH (benign prostatic hyperplasia) 06/20/2015  . ED (erectile dysfunction) 06/20/2015  . Allergic rhinitis,  seasonal 06/20/2015  . Anemia of chronic disease 06/20/2015  . Hypogonadism in male 06/20/2015  . Asthma, well controlled, moderate persistent 06/20/2015  . Hypertension, benign 06/20/2015  . Hyperglycemia 06/20/2015  . History of shingles 06/20/2015  . GERD without esophagitis 06/20/2015  . Chronic radicular low back pain 06/20/2015  . History of epilepsy 06/20/2015  . Migraine without aura and without status migrainosus, not intractable 06/20/2015  . Hypokalemia 06/20/2015  . Dyslipidemia 06/20/2015  . Primary osteoarthritis of both knees 06/20/2015    Past Surgical History:  Procedure Laterality Date  . COLONOSCOPY  02/2012   normal  . KNEE ARTHROSCOPY Left 10/06/2009    Family History  Problem Relation Age of Onset  . Diabetes Mother   . Heart disease Mother   .  Seizures Father   . Lung disease Mother     Social History   Social History  . Marital status: Married    Spouse name: N/A  . Number of children: N/A  . Years of education: N/A   Occupational History  . Not on file.   Social History Main Topics  . Smoking status: Former Research scientist (life sciences)  . Smokeless tobacco: Never Used  . Alcohol use No  . Drug use: No  . Sexual activity: Yes    Partners: Female   Other Topics Concern  . Not on file   Social History Narrative  . No narrative on file     Current Outpatient Prescriptions:  .  acetaminophen (TYLENOL) 500 MG tablet, Take 1 tablet (500 mg total) by mouth every 8 (eight) hours as needed., Disp: 90 tablet, Rfl: 0 .  amLODipine-valsartan (EXFORGE) 5-320 MG tablet, Take 1 tablet by mouth daily., Disp: 30 tablet, Rfl: 5 .  azelastine (OPTIVAR) 0.05 % ophthalmic solution, Place 2 drops into both eyes 2 (two) times daily., Disp: 6 mL, Rfl: 5 .  diclofenac (CATAFLAM) 50 MG tablet, Take 50 mg by mouth 2 (two) times daily with a meal., Disp: , Rfl: 3 .  fexofenadine (ALLEGRA) 60 MG tablet, Take 60 mg by mouth 2 (two) times daily., Disp: , Rfl:  .  fluticasone (FLONASE) 50 MCG/ACT nasal spray, Place 2 sprays into both nostrils daily., Disp: 16 g, Rfl: 5 .  fluticasone furoate-vilanterol (BREO ELLIPTA) 100-25 MCG/INH AEPB, Inhale 2 puffs into the lungs daily., Disp: 60 each, Rfl: 5 .  metaxalone (SKELAXIN) 800 MG tablet, TAKE 1 TABLET BY MOUTH 2-3 TIMES A DAY AS NEEDED PAIN/SPASMS, Disp: , Rfl: 2 .  montelukast (SINGULAIR) 10 MG tablet, Take 1 tablet (10 mg total) by mouth daily., Disp: 30 tablet, Rfl: 5 .  potassium chloride SA (KLOR-CON M20) 20 MEQ tablet, Take 2 tablets (40 mEq total) by mouth 2 (two) times daily., Disp: 120 tablet, Rfl: 0 .  tadalafil (CIALIS) 5 MG tablet, Take 1 tablet (5 mg total) by mouth daily., Disp: 30 tablet, Rfl: 5  No Known Allergies   ROS  Constitutional: Negative for fever or weight change.  Respiratory:  Negative for cough and shortness of breath.   Cardiovascular: Negative for chest pain or palpitations.  Gastrointestinal: Negative for abdominal pain, no bowel changes.  Musculoskeletal: Positive  for gait problem or joint swelling.  Skin: Negative for rash.  Neurological: Negative for dizziness or headache.  No other specific complaints in a complete review of systems (except as listed in HPI above).  Objective  Vitals:   10/10/15 1505  BP: 134/76  Pulse: 92  Resp: 18  Temp: 98.2 F (36.8 C)  TempSrc: Oral  SpO2: 95%  Weight: 299 lb 8 oz (135.9 kg)  Height: 6\' 2"  (1.88 m)    Body mass index is 38.45 kg/m.  Physical Exam  Constitutional: Patient appears well-developed and well-nourished. Obese  No distress.  HEENT: head atraumatic, normocephalic, pupils equal and reactive to light,  neck supple, throat within normal limits Cardiovascular: Normal rate, regular rhythm and normal heart sounds.  No murmur heard. No BLE edema. Pulmonary/Chest: Effort normal and breath sounds normal. No respiratory distress. Abdominal: Soft.  There is no tenderness. Psychiatric: Patient has a normal mood and affect. behavior is normal. Judgment and thought content normal. Muscular skeletal: crepitus with extension of both knees, decrease rom of right knee, no pain during palpation of lumbar spine  PHQ2/9: Depression screen [facility] 2/9 10/10/2015 06/20/2015 10/31/2014  Decreased Interest 0 0 0  Down, Depressed, Hopeless 0 0 0  PHQ - 2 Score 0 0 0     Fall Risk: Fall Risk  10/10/2015 06/20/2015  Falls in the past year? No No     Functional Status Survey: Is the patient deaf or have difficulty hearing?: No Does the patient have difficulty seeing, even when wearing glasses/contacts?: No Does the patient have difficulty concentrating, remembering, or making decisions?: No Does the patient have difficulty walking or climbing stairs?: No Does the patient have difficulty dressing or bathing?:  No Does the patient have difficulty doing errands alone such as visiting a doctor's office or shopping?: No    Assessment & Plan  1. Hypertension, benign  - amLODipine-valsartan (EXFORGE) 5-320 MG tablet; Take 1 tablet by mouth daily.  Dispense: 30 tablet; Refill: 5  2. Needs flu shot  - Flu Vaccine QUAD 36+ mos PF IM (Fluarix & Fluzone Quad PF)  3. Hypokalemia  Seeing Dr. Holley Raring and Dr. Gabriel Carina, likely primary hyperaldosteronism   4. Dyslipidemia  On life style modification   5. Benign prostatic hyperplasia with urinary obstruction  - tadalafil (CIALIS) 5 MG tablet; Take 1 tablet (5 mg total) by mouth daily.  Dispense: 30 tablet; Refill: 5 Seen by Urologist, taking Cialis and seems to help with symptoms, no longer has accidents, advised him to go back but he would like to hold off since he has so many other doctors to see. He states finasterine caused too much dryness but is feeling better on Cialis  6. Chronic radicular low back pain  Doing well at this time  7. Primary osteoarthritis of both knees  Continue follow up with Dr. Berenice Primas  8. Asthma, well controlled, moderate persistent  - fluticasone furoate-vilanterol (BREO ELLIPTA) 100-25 MCG/INH AEPB; Inhale 2 puffs into the lungs daily.  Dispense: 60 each; Refill: 5  9. History of iron deficiency anemia   10. Primary hyperaldosteronism ([place])  Keep follow up with sub-specialists, will have CT done soon

## 2015-10-11 ENCOUNTER — Telehealth: Payer: Self-pay

## 2015-10-11 ENCOUNTER — Ambulatory Visit
Admission: RE | Admit: 2015-10-11 | Discharge: 2015-10-11 | Disposition: A | Discharge status: Home / Self Care | Payer: BC Managed Care – PPO | Source: Ambulatory Visit | Attending: Internal Medicine | Admitting: Internal Medicine

## 2015-10-11 DIAGNOSIS — E278 Other specified disorders of adrenal gland: Secondary | ICD-10-CM | POA: Insufficient documentation

## 2015-10-11 DIAGNOSIS — E269 Hyperaldosteronism, unspecified: Secondary | ICD-10-CM | POA: Diagnosis present

## 2015-10-11 DIAGNOSIS — I700 Atherosclerosis of aorta: Secondary | ICD-10-CM | POA: Diagnosis not present

## 2015-10-11 DIAGNOSIS — R269 Unspecified abnormalities of gait and mobility: Secondary | ICD-10-CM | POA: Diagnosis not present

## 2015-10-11 MED ORDER — FLUTICASONE FUROATE-VILANTEROL 200-25 MCG/INH IN AEPB: 1.0000 | INHALATION_SPRAY | Freq: Every day | RESPIRATORY_TRACT | 5 refills | Status: DC

## 2015-10-11 MED ORDER — IOPAMIDOL (ISOVUE-300) INJECTION 61%
100.0000 mL | Freq: Once | INTRAVENOUS | Status: AC | PRN
  Administered 2015-10-11: 100 mL via INTRAVENOUS

## 2015-10-11 NOTE — Telephone Encounter (Signed)
Pharmacist Almyra Free called from [facility] and states the dosage of Memory Dance is not indicated due to the second ingredient being 50 then. But they do make a Breo that is 200-25 mcg could we send a new prescription in for this dosage for one puff a day? Thanks

## 2015-10-19 ENCOUNTER — Ambulatory Visit: Payer: PRIVATE HEALTH INSURANCE

## 2016-01-18 ENCOUNTER — Other Ambulatory Visit: Payer: Self-pay | Admitting: Family Medicine

## 2016-01-18 MED ORDER — MONTELUKAST SODIUM 10 MG PO TABS: 10.0000 mg | ORAL_TABLET | Freq: Every day | ORAL | 5 refills | Status: DC

## 2016-01-18 NOTE — Telephone Encounter (Signed)
Pt requesting refill on montelukast sod 10mg  tablets 30. As[facility] [place]. Patient has 2 pills left

## 2016-01-29 DIAGNOSIS — M1712 Unilateral primary osteoarthritis, left knee: Secondary | ICD-10-CM | POA: Diagnosis not present

## 2016-01-29 DIAGNOSIS — M1711 Unilateral primary osteoarthritis, right knee: Secondary | ICD-10-CM | POA: Diagnosis not present

## 2016-02-11 DIAGNOSIS — D3500 Benign neoplasm of unspecified adrenal gland: Secondary | ICD-10-CM | POA: Diagnosis not present

## 2016-02-11 DIAGNOSIS — E876 Hypokalemia: Secondary | ICD-10-CM | POA: Diagnosis present

## 2016-02-11 DIAGNOSIS — D3502 Benign neoplasm of left adrenal gland: Secondary | ICD-10-CM | POA: Diagnosis present

## 2016-02-11 DIAGNOSIS — J45909 Unspecified asthma, uncomplicated: Secondary | ICD-10-CM | POA: Diagnosis present

## 2016-02-11 DIAGNOSIS — I10 Essential (primary) hypertension: Secondary | ICD-10-CM | POA: Diagnosis present

## 2016-02-11 DIAGNOSIS — Z86018 Personal history of other benign neoplasm: Secondary | ICD-10-CM | POA: Insufficient documentation

## 2016-02-11 DIAGNOSIS — F17210 Nicotine dependence, cigarettes, uncomplicated: Secondary | ICD-10-CM | POA: Diagnosis present

## 2016-02-11 DIAGNOSIS — E2601 Conn's syndrome: Secondary | ICD-10-CM | POA: Diagnosis present

## 2016-02-11 DIAGNOSIS — E2689 Other hyperaldosteronism: Secondary | ICD-10-CM | POA: Diagnosis not present

## 2016-02-11 HISTORY — PX: ADRENALECTOMY: SHX876

## 2016-02-27 DIAGNOSIS — Z7951 Long term (current) use of inhaled steroids: Secondary | ICD-10-CM | POA: Diagnosis not present

## 2016-02-27 DIAGNOSIS — M6281 Muscle weakness (generalized): Secondary | ICD-10-CM | POA: Diagnosis not present

## 2016-02-27 DIAGNOSIS — Z9089 Acquired absence of other organs: Secondary | ICD-10-CM | POA: Diagnosis not present

## 2016-02-27 DIAGNOSIS — I10 Essential (primary) hypertension: Secondary | ICD-10-CM | POA: Diagnosis not present

## 2016-02-27 DIAGNOSIS — E875 Hyperkalemia: Secondary | ICD-10-CM | POA: Diagnosis not present

## 2016-02-27 DIAGNOSIS — Z6832 Body mass index (BMI) 32.0-32.9, adult: Secondary | ICD-10-CM | POA: Diagnosis not present

## 2016-02-27 DIAGNOSIS — E269 Hyperaldosteronism, unspecified: Secondary | ICD-10-CM | POA: Diagnosis not present

## 2016-02-27 DIAGNOSIS — J45909 Unspecified asthma, uncomplicated: Secondary | ICD-10-CM | POA: Diagnosis not present

## 2016-02-27 DIAGNOSIS — R110 Nausea: Secondary | ICD-10-CM | POA: Diagnosis not present

## 2016-02-27 DIAGNOSIS — R300 Dysuria: Secondary | ICD-10-CM | POA: Diagnosis not present

## 2016-02-27 DIAGNOSIS — R1084 Generalized abdominal pain: Secondary | ICD-10-CM | POA: Diagnosis not present

## 2016-02-28 DIAGNOSIS — Z6832 Body mass index (BMI) 32.0-32.9, adult: Secondary | ICD-10-CM | POA: Diagnosis not present

## 2016-02-28 DIAGNOSIS — J45909 Unspecified asthma, uncomplicated: Secondary | ICD-10-CM | POA: Diagnosis present

## 2016-02-28 DIAGNOSIS — I771 Stricture of artery: Secondary | ICD-10-CM | POA: Diagnosis not present

## 2016-02-28 DIAGNOSIS — Z87891 Personal history of nicotine dependence: Secondary | ICD-10-CM | POA: Diagnosis not present

## 2016-02-28 DIAGNOSIS — I10 Essential (primary) hypertension: Secondary | ICD-10-CM | POA: Diagnosis present

## 2016-02-28 DIAGNOSIS — Z9089 Acquired absence of other organs: Secondary | ICD-10-CM | POA: Diagnosis not present

## 2016-02-28 DIAGNOSIS — R5381 Other malaise: Secondary | ICD-10-CM | POA: Diagnosis present

## 2016-02-28 DIAGNOSIS — R079 Chest pain, unspecified: Secondary | ICD-10-CM | POA: Diagnosis not present

## 2016-02-28 DIAGNOSIS — R300 Dysuria: Secondary | ICD-10-CM | POA: Diagnosis present

## 2016-02-28 DIAGNOSIS — R110 Nausea: Secondary | ICD-10-CM | POA: Diagnosis not present

## 2016-02-28 DIAGNOSIS — Z7951 Long term (current) use of inhaled steroids: Secondary | ICD-10-CM | POA: Diagnosis not present

## 2016-02-28 DIAGNOSIS — E875 Hyperkalemia: Secondary | ICD-10-CM | POA: Diagnosis not present

## 2016-02-28 DIAGNOSIS — R1084 Generalized abdominal pain: Secondary | ICD-10-CM | POA: Diagnosis not present

## 2016-02-28 DIAGNOSIS — M6281 Muscle weakness (generalized): Secondary | ICD-10-CM | POA: Diagnosis not present

## 2016-03-05 DIAGNOSIS — E269 Hyperaldosteronism, unspecified: Secondary | ICD-10-CM | POA: Diagnosis not present

## 2016-03-11 ENCOUNTER — Encounter: Payer: Self-pay | Admitting: Family Medicine

## 2016-03-11 ENCOUNTER — Ambulatory Visit (INDEPENDENT_AMBULATORY_CARE_PROVIDER_SITE_OTHER): Payer: Medicare Other | Admitting: Family Medicine

## 2016-03-11 VITALS — BP 132/78 | HR 103 | Temp 98.0°F | Resp 16 | Ht 74.0 in | Wt 269.4 lb

## 2016-03-11 DIAGNOSIS — K30 Functional dyspepsia: Secondary | ICD-10-CM

## 2016-03-11 DIAGNOSIS — N138 Other obstructive and reflux uropathy: Secondary | ICD-10-CM | POA: Diagnosis not present

## 2016-03-11 DIAGNOSIS — E785 Hyperlipidemia, unspecified: Secondary | ICD-10-CM | POA: Diagnosis not present

## 2016-03-11 DIAGNOSIS — R634 Abnormal weight loss: Secondary | ICD-10-CM

## 2016-03-11 DIAGNOSIS — I10 Essential (primary) hypertension: Secondary | ICD-10-CM | POA: Diagnosis not present

## 2016-03-11 DIAGNOSIS — N401 Enlarged prostate with lower urinary tract symptoms: Secondary | ICD-10-CM

## 2016-03-11 DIAGNOSIS — Z86018 Personal history of other benign neoplasm: Secondary | ICD-10-CM | POA: Diagnosis not present

## 2016-03-11 DIAGNOSIS — J4540 Moderate persistent asthma, uncomplicated: Secondary | ICD-10-CM | POA: Diagnosis not present

## 2016-03-11 DIAGNOSIS — M1711 Unilateral primary osteoarthritis, right knee: Secondary | ICD-10-CM | POA: Diagnosis not present

## 2016-03-11 LAB — CBC WITH DIFFERENTIAL/PLATELET
BASOS PCT: 1 %
Basophils Absolute: 65 cells/uL (ref 0–200)
EOS ABS: 260 {cells}/uL (ref 15–500)
Eosinophils Relative: 4 %
HEMATOCRIT: 31.6 % — AB (ref 38.5–50.0)
HEMOGLOBIN: 10.3 g/dL — AB (ref 13.2–17.1)
LYMPHS ABS: 2275 {cells}/uL (ref 850–3900)
LYMPHS PCT: 35 %
MCH: 27.2 pg (ref 27.0–33.0)
MCHC: 32.6 g/dL (ref 32.0–36.0)
MCV: 83.4 fL (ref 80.0–100.0)
MONO ABS: 910 {cells}/uL (ref 200–950)
MPV: 9.8 fL (ref 7.5–12.5)
Monocytes Relative: 14 %
Neutro Abs: 2990 cells/uL (ref 1500–7800)
Neutrophils Relative %: 46 %
Platelets: 210 10*3/uL (ref 140–400)
RBC: 3.79 MIL/uL — ABNORMAL LOW (ref 4.20–5.80)
RDW: 15 % (ref 11.0–15.0)
WBC: 6.5 10*3/uL (ref 3.8–10.8)

## 2016-03-11 LAB — HEPATIC FUNCTION PANEL
ALT: 20 U/L (ref 9–46)
AST: 21 U/L (ref 10–35)
Albumin: 4.1 g/dL (ref 3.6–5.1)
Alkaline Phosphatase: 93 U/L (ref 40–115)
BILIRUBIN DIRECT: 0.1 mg/dL (ref <=0.2)
BILIRUBIN INDIRECT: 0.3 mg/dL (ref 0.2–1.2)
TOTAL PROTEIN: 7.2 g/dL (ref 6.1–8.1)
Total Bilirubin: 0.4 mg/dL (ref 0.2–1.2)

## 2016-03-11 MED ORDER — OMEPRAZOLE 40 MG PO CPDR: 40.0000 mg | DELAYED_RELEASE_CAPSULE | ORAL | 0 refills | Status: DC

## 2016-03-11 MED ORDER — TADALAFIL 5 MG PO TABS: 5.0000 mg | ORAL_TABLET | Freq: Every day | ORAL | 5 refills | Status: DC

## 2016-03-11 MED ORDER — ALBUTEROL SULFATE HFA 108 (90 BASE) MCG/ACT IN AERS: 2.0000 | INHALATION_SPRAY | Freq: Four times a day (QID) | RESPIRATORY_TRACT | 0 refills | Status: DC | PRN

## 2016-03-11 MED ORDER — AMLODIPINE BESYLATE-VALSARTAN 5-320 MG PO TABS: 1.0000 | ORAL_TABLET | Freq: Every day | ORAL | 5 refills | Status: DC

## 2016-03-11 MED ORDER — TAMSULOSIN HCL 0.4 MG PO CAPS: 0.4000 mg | ORAL_CAPSULE | Freq: Every day | ORAL | 5 refills | Status: DC

## 2016-03-11 MED ORDER — FLUTICASONE FUROATE-VILANTEROL 200-25 MCG/INH IN AEPB: 1.0000 | INHALATION_SPRAY | Freq: Every day | RESPIRATORY_TRACT | 5 refills | Status: DC

## 2016-03-11 NOTE — Progress Notes (Signed)
Name: [name]   MRN: [id number]    DOB: January 05, 1951   Date:03/11/2016       Progress Note  Subjective  Chief Complaint  Chief Complaint  Patient presents with  . Follow-up    adreanal gland removed on left side beginning of Feb. pt was in hospital  from 02/27/16 until 02/29/16 due to having too much potassium levels are now normal     HPI  History of hyperaldosteronism: he had surgery on 02/11/2016, since than he has noticed that he is not as anxious, he is not getting up too many times at night to go to the bathroom, he is drinking less water, but he has noticed burping, some heartburn, also weight loss, he has also noticed early satiety. He has a history of GERD but not currently taking medication for it  BPH: he used to take Finasteride, symptoms improved after adrenal gland removal, however still very symptomatic, we will start medication, we will try Flomax  IPSS Questionnaire (AUA-7): Over the past month.   1)  How often have you had a sensation of not emptying your bladder completely after you finish urinating?  3 - About half the time  2)  How often have you had to urinate again less than two hours after you finished urinating? 2 - Less than half the time  3)  How often have you found you stopped and started again several times when you urinated?  4 - More than half the time  4) How difficult have you found it to postpone urination?  1 - Less than 1 time in 5  5) How often have you had a weak urinary stream?  3 - About half the time  6) How often have you had to push or strain to begin urination?  3 - About half the time  7) How many times did you most typically get up to urinate from the time you went to bed until the time you got up in the morning?  4 - 4 times  Total score:  0-7 mildly symptomatic   8-19 moderately symptomatic   20-35 severely symptomatic    HTN: taking medication and is doing well, no chest pain or palpitation  Asthma: taking Breo daily, no SOB,  he has some chest congestion at night a couple of times a week, we will give him a rescue inhaler.   Patient Active Problem List   Diagnosis Date Noted  . History of benign neoplasm of adrenal gland 02/11/2016  . History of iron deficiency anemia 10/10/2015  . BPH (benign prostatic hyperplasia) 06/20/2015  . ED (erectile dysfunction) 06/20/2015  . Allergic rhinitis, seasonal 06/20/2015  . Anemia of chronic disease 06/20/2015  . Hypogonadism in male 06/20/2015  . Asthma, well controlled, moderate persistent 06/20/2015  . Hypertension, benign 06/20/2015  . Hyperglycemia 06/20/2015  . History of shingles 06/20/2015  . GERD without esophagitis 06/20/2015  . Chronic radicular low back pain 06/20/2015  . History of epilepsy 06/20/2015  . Migraine without aura and without status migrainosus, not intractable 06/20/2015  . Dyslipidemia 06/20/2015  . Primary osteoarthritis of both knees 06/20/2015    Past Surgical History:  Procedure Laterality Date  . ADRENALECTOMY Left 02/11/2016   [facility]  . COLONOSCOPY  02/2012   normal  . KNEE ARTHROSCOPY Left 10/06/2009    Family History  Problem Relation Age of Onset  . Diabetes Mother   . Heart disease Mother   . Lung disease Mother   .  Seizures Father     Social History   Social History  . Marital status: Married    Spouse name: N/A  . Number of children: N/A  . Years of education: N/A   Occupational History  . Not on file.   Social History Main Topics  . Smoking status: Former Research scientist (life sciences)  . Smokeless tobacco: Never Used  . Alcohol use No  . Drug use: No  . Sexual activity: Yes    Partners: Female   Other Topics Concern  . Not on file   Social History Narrative  . No narrative on file     Current Outpatient Prescriptions:  .  traMADol (ULTRAM) 50 MG tablet, Take 50 mg by mouth every 8 (eight) hours. , Disp: , Rfl:  .  acetaminophen (TYLENOL) 500 MG tablet, Take 1 tablet (500 mg total) by mouth every 8 (eight) hours as  needed., Disp: 90 tablet, Rfl: 0 .  amLODipine-valsartan (EXFORGE) 5-320 MG tablet, Take 1 tablet by mouth daily., Disp: 30 tablet, Rfl: 5 .  azelastine (OPTIVAR) 0.05 % ophthalmic solution, Place 2 drops into both eyes 2 (two) times daily., Disp: 6 mL, Rfl: 5 .  diclofenac (CATAFLAM) 50 MG tablet, Take 50 mg by mouth 2 (two) times daily with a meal., Disp: , Rfl: 3 .  fexofenadine (ALLEGRA) 60 MG tablet, Take 60 mg by mouth 2 (two) times daily., Disp: , Rfl:  .  fluticasone (FLONASE) 50 MCG/ACT nasal spray, Place 2 sprays into both nostrils daily., Disp: 16 g, Rfl: 5 .  fluticasone furoate-vilanterol (BREO ELLIPTA) 200-25 MCG/INH AEPB, Inhale 1 puff into the lungs daily., Disp: 60 each, Rfl: 5 .  montelukast (SINGULAIR) 10 MG tablet, Take 1 tablet (10 mg total) by mouth daily., Disp: 30 tablet, Rfl: 5 .  tadalafil (CIALIS) 5 MG tablet, Take 1 tablet (5 mg total) by mouth daily., Disp: 30 tablet, Rfl: 5 .  tamsulosin (FLOMAX) 0.4 MG CAPS capsule, Take 1 capsule (0.4 mg total) by mouth daily., Disp: 30 capsule, Rfl: 5  No Known Allergies   ROS  Constitutional: Negative for fever, positive for  weight change.  Respiratory: Negative for cough and shortness of breath.   Cardiovascular: Negative for chest pain or palpitations.  Gastrointestinal: Negative for abdominal pain, no bowel changes.  Musculoskeletal: Negative for gait problem or joint swelling.  Skin: Negative for rash.  Neurological: Negative for dizziness or headache.  No other specific complaints in a complete review of systems (except as listed in HPI above).  Objective  Vitals:   03/11/16 1451  BP: 132/78  Pulse: (!) 103  Resp: 16  Temp: 98 F (36.7 C)  SpO2: 97%  Weight: 269 lb 6 oz (122.2 kg)  Height: 6\' 2"  (1.88 m)    Body mass index is 34.59 kg/m.  Physical Exam  Constitutional: Patient appears well-developed and well-nourished. Obese No distress.  HEENT: head atraumatic, normocephalic, pupils equal and  reactive to light, neck supple, throat within normal limits Cardiovascular: Normal rate, regular rhythm and normal heart sounds.  No murmur heard. No BLE edema. Pulmonary/Chest: Effort normal and breath sounds normal. No respiratory distress. Abdominal: Soft.  There is mild epigastric  Tenderness, healing scar from recent surgery. Psychiatric: Patient has a normal mood and affect. behavior is normal. Judgment and thought content normal.  PHQ2/9: Depression screen [facility] 2/9 10/10/2015 06/20/2015 10/31/2014  Decreased Interest 0 0 0  Down, Depressed, Hopeless 0 0 0  PHQ - 2 Score 0 0 0  Fall Risk: Fall Risk  10/10/2015 06/20/2015  Falls in the past year? No No    Assessment & Plan  1. Hypertension, benign  - amLODipine-valsartan (EXFORGE) 5-320 MG tablet; Take 1 tablet by mouth daily.  Dispense: 30 tablet; Refill: 5  2. Benign prostatic hyperplasia with urinary obstruction  - tadalafil (CIALIS) 5 MG tablet; Take 1 tablet (5 mg total) by mouth daily.  Dispense: 30 tablet; Refill: 5 - tamsulosin (FLOMAX) 0.4 MG CAPS capsule; Take 1 capsule (0.4 mg total) by mouth daily.  Dispense: 30 capsule; Refill: 5  3. Asthma, well controlled, moderate persistent  - fluticasone furoate-vilanterol (BREO ELLIPTA) 200-25 MCG/INH AEPB; Inhale 1 puff into the lungs daily.  Dispense: 60 each; Refill: 5   4. Dyslipidemia  On life style modification   5. History of benign neoplasm of adrenal gland  S/p adrenal gland removal, last potassium slightly up, off potassium supplements and we will recheck levels today  6. Indigestion  - H. pylori breath test We will add Omeprazole   7. Weight loss  - TSH - CBC with Differential/Platelet - Hepatic function panel

## 2016-03-12 LAB — H. PYLORI BREATH TEST: H. pylori Breath Test: NOT DETECTED

## 2016-03-12 LAB — TSH: TSH: 1.63 mIU/L (ref 0.40–4.50)

## 2016-03-26 ENCOUNTER — Ambulatory Visit (INDEPENDENT_AMBULATORY_CARE_PROVIDER_SITE_OTHER): Payer: Medicare Other | Admitting: Family Medicine

## 2016-03-26 ENCOUNTER — Encounter: Payer: Self-pay | Admitting: Family Medicine

## 2016-03-26 VITALS — BP 116/60 | HR 101 | Temp 97.5°F | Resp 16 | Ht 74.0 in | Wt 270.8 lb

## 2016-03-26 DIAGNOSIS — D62 Acute posthemorrhagic anemia: Secondary | ICD-10-CM | POA: Diagnosis not present

## 2016-03-26 DIAGNOSIS — Z01818 Encounter for other preprocedural examination: Secondary | ICD-10-CM

## 2016-03-26 DIAGNOSIS — J4540 Moderate persistent asthma, uncomplicated: Secondary | ICD-10-CM | POA: Diagnosis not present

## 2016-03-26 DIAGNOSIS — Z86018 Personal history of other benign neoplasm: Secondary | ICD-10-CM

## 2016-03-26 DIAGNOSIS — M170 Bilateral primary osteoarthritis of knee: Secondary | ICD-10-CM

## 2016-03-26 DIAGNOSIS — K30 Functional dyspepsia: Secondary | ICD-10-CM

## 2016-03-26 DIAGNOSIS — I10 Essential (primary) hypertension: Secondary | ICD-10-CM | POA: Diagnosis not present

## 2016-03-26 MED ORDER — OMEPRAZOLE 40 MG PO CPDR: 40.0000 mg | DELAYED_RELEASE_CAPSULE | ORAL | 0 refills | Status: DC

## 2016-03-26 MED ORDER — FERROUS SULFATE 325 (65 FE) MG PO TABS: 325.0000 mg | ORAL_TABLET | Freq: Every day | ORAL | 2 refills | Status: DC

## 2016-03-26 NOTE — Patient Instructions (Addendum)
Take half dose of Exforge for the next week and return  to have CMA check your blood pressure  Take Allegra D only occasionally because it can raise your heart rate, take Allegra instead and saline spray or flonase for congestion  Take Diclofenac with food and try skip doses if possible to help with indigestion, replace with Tylenol 500 mg up to four times daily   Start taking ferrous sulfate 325 mg ( sending prescription to pharmacy ), take Colace 100 mg daily with iron supplementation  You may take bp medication and Breo the morning of surgery, may hold other medication.   Return on April 20 th late pm to have your labs drawn

## 2016-03-26 NOTE — Progress Notes (Signed)
Name: [name]   MRN: [id number]    DOB: [birth year]   Date:03/26/2016       Progress Note  Subjective  Chief Complaint  Chief Complaint  Patient presents with  . Medical Clearance    Surgery on Right Total Arthroplasty with Dr. Berenice Macdonald at [facility]    HPI  Pre-op: he had major surgery for removal of left adrenal gland and tolerated procedure well, denies SOB with activity or decrease in exercise tolerance, last labs normal except for anemia ( likely from blood loss ), EKG normal 02/28/2016 at [facility]. He is here today as requested by Dr. Erlinda Macdonald for pre-op clearance prior to right total knee replacement surgery planned for end of May.   HTN: since surgery he has occasional episodes of feeling dizzy,not sure what triggers episodes, but bp is low , so we will decrease dose of Exforge and monitor. Recheck next week.   Knee pain: OA, has been taking diclofenac twice daily about 6 months ago and has noticed indigestion, worse since surgery back in 02/2016, doing better with Omeprazole, but advised to try avoiding nsaid's  Patient Active Problem List   Diagnosis Date Noted  . History of benign neoplasm of adrenal gland 02/11/2016  . History of iron deficiency anemia 10/10/2015  . BPH (benign prostatic hyperplasia) 06/20/2015  . ED (erectile dysfunction) 06/20/2015  . Allergic rhinitis, seasonal 06/20/2015  . Anemia of chronic disease 06/20/2015  . Hypogonadism in male 06/20/2015  . Asthma, well controlled, moderate persistent 06/20/2015  . Hypertension, benign 06/20/2015  . Hyperglycemia 06/20/2015  . History of shingles 06/20/2015  . GERD without esophagitis 06/20/2015  . Chronic radicular low back pain 06/20/2015  . History of epilepsy 06/20/2015  . Migraine without aura and without status migrainosus, not intractable 06/20/2015  . Dyslipidemia 06/20/2015  . Primary osteoarthritis of both knees 06/20/2015    Past Surgical History:  Procedure Laterality Date  .  ADRENALECTOMY Left 02/11/2016   [facility]  . COLONOSCOPY  02/2012   normal  . KNEE ARTHROSCOPY Left 10/06/2009    Family History  Problem Relation Age of Onset  . Diabetes Mother   . Heart disease Mother   . Lung disease Mother   . Seizures Father     Social History   Social History  . Marital status: Married    Spouse name: N/A  . Number of children: N/A  . Years of education: N/A   Occupational History  . Not on file.   Social History Main Topics  . Smoking status: Former Research scientist (life sciences)  . Smokeless tobacco: Never Used  . Alcohol use No  . Drug use: No  . Sexual activity: Yes    Partners: Female   Other Topics Concern  . Not on file   Social History Narrative  . No narrative on file     Current Outpatient Prescriptions:  .  acetaminophen (TYLENOL) 500 MG tablet, Take 1 tablet (500 mg total) by mouth every 8 (eight) hours as needed., Disp: 90 tablet, Rfl: 0 .  albuterol (PROAIR HFA) 108 (90 Base) MCG/ACT inhaler, Inhale 2 puffs into the lungs every 6 (six) hours as needed for wheezing or shortness of breath., Disp: 1 Inhaler, Rfl: 0 .  amLODipine-valsartan (EXFORGE) 5-320 MG tablet, Take 1 tablet by mouth daily., Disp: 30 tablet, Rfl: 5 .  azelastine (OPTIVAR) 0.05 % ophthalmic solution, Place 2 drops into both eyes 2 (two) times daily., Disp: 6 mL, Rfl: 5 .  diclofenac (VOLTAREN) 75 MG EC  tablet, Take 1 tablet by mouth 2 (two) times daily., Disp: , Rfl: 2 .  fexofenadine (ALLEGRA) 60 MG tablet, Take 60 mg by mouth 2 (two) times daily., Disp: , Rfl:  .  fluticasone (FLONASE) 50 MCG/ACT nasal spray, Place 2 sprays into both nostrils daily., Disp: 16 g, Rfl: 5 .  fluticasone furoate-vilanterol (BREO ELLIPTA) 200-25 MCG/INH AEPB, Inhale 1 puff into the lungs daily., Disp: 60 each, Rfl: 5 .  montelukast (SINGULAIR) 10 MG tablet, Take 1 tablet (10 mg total) by mouth daily., Disp: 30 tablet, Rfl: 5 .  omeprazole (PRILOSEC) 40 MG capsule, Take 1 capsule (40 mg total) by mouth every  morning., Disp: 30 capsule, Rfl: 0 .  tadalafil (CIALIS) 5 MG tablet, Take 1 tablet (5 mg total) by mouth daily., Disp: 30 tablet, Rfl: 5 .  tamsulosin (FLOMAX) 0.4 MG CAPS capsule, Take 1 capsule (0.4 mg total) by mouth daily., Disp: 30 capsule, Rfl: 5 .  traMADol (ULTRAM) 50 MG tablet, Take 50 mg by mouth every 8 (eight) hours. , Disp: , Rfl:   No Known Allergies   ROS  Constitutional: Negative for fever or weight change.  Respiratory: Positive  for intermittent cough but  shortness of breath.   Cardiovascular: Negative for chest pain or palpitations.  Gastrointestinal: Negative for abdominal pain, no bowel changes, he has indigestion - but better with Omeprazole.  Musculoskeletal: Negative for gait problem, positive for  joint swelling both knees.  Skin: Negative for rash.  Neurological: Positive  for dizziness intermittent, no headache.  No other specific complaints in a complete review of systems (except as listed in HPI above).  Objective  Vitals:   03/26/16 1327  BP: 116/60  Pulse: (!) 101  Resp: 16  Temp: 97.5 F (36.4 C)  TempSrc: Oral  SpO2: 94%  Weight: 270 lb 12.8 oz (122.8 kg)  Height: _0  (1.88 m)    Body mass index is 34.77 kg/m.  Physical Exam  Constitutional: Patient appears well-developed and well-nourished. Obese No distress.  HEENT: head atraumatic, normocephalic, pupils equal and reactive to light,  neck supple, throat within normal limits Cardiovascular: Normal rate, regular rhythm and normal heart sounds.  No murmur heard. No BLE edema. Pulmonary/Chest: Effort normal and breath sounds normal. No respiratory distress. Abdominal: Soft.  There is no tenderness. Psychiatric: Patient has a normal mood and affect. behavior is normal. Judgment and thought content normal. Muscular skeletal: crepitus with extension of both knees, no effusion  Recent Results (from the past 2160 hour(s))  H. pylori breath test     Status: None   Collection Time: 03/11/16   4:03 PM  Result Value Ref Range   H. pylori Breath Test NOT DETECTED Not Detected    Comment:   Antimicrobials, proton pump inhibitors, and bismuth preparations are known to suppress H. pylori, and ingestion of these prior to H. pylori diagnostic testing may lead to false negative results. If clinically indicated, the test may be repeated on a new specimen obtained two weeks after discontinuing treatment.     TSH     Status: None   Collection Time: 03/11/16  4:03 PM  Result Value Ref Range   TSH 1.63 0.40 - 4.50 mIU/L  CBC with Differential/Platelet     Status: Abnormal   Collection Time: 03/11/16  4:03 PM  Result Value Ref Range   WBC 6.5 3.8 - 10.8 K/uL   RBC 3.79 (L) 4.20 - 5.80 MIL/uL   Hemoglobin 10.3 (L) 13.2 - 17.1 g/dL  HCT 31.6 (L) 38.5 - 50.0 %   MCV 83.4 80.0 - 100.0 fL   MCH 27.2 27.0 - 33.0 pg   MCHC 32.6 32.0 - 36.0 g/dL   RDW 15.0 11.0 - 15.0 %   Platelets 210 140 - 400 K/uL   MPV 9.8 7.5 - 12.5 fL   Neutro Abs 2,990 1,500 - 7,800 cells/uL   Lymphs Abs 2,275 850 - 3,900 cells/uL   Monocytes Absolute 910 200 - 950 cells/uL   Eosinophils Absolute 260 15 - 500 cells/uL   Basophils Absolute 65 0 - 200 cells/uL   Neutrophils Relative % 46 %   Lymphocytes Relative 35 %   Monocytes Relative 14 %   Eosinophils Relative 4 %   Basophils Relative 1 %   Smear Review Criteria for review not met   Hepatic function panel     Status: None   Collection Time: 03/11/16  4:03 PM  Result Value Ref Range   Total Bilirubin 0.4 0.2 - 1.2 mg/dL   Bilirubin, Direct 0.1 <=0.2 mg/dL   Indirect Bilirubin 0.3 0.2 - 1.2 mg/dL   Alkaline Phosphatase 93 40 - 115 U/L   AST 21 10 - 35 U/L   ALT 20 9 - 46 U/L   Total Protein 7.2 6.1 - 8.1 g/dL   Albumin 4.1 3.6 - 5.1 g/dL      PHQ2/9: Depression screen [facility] 2/9 10/10/2015 06/20/2015 10/31/2014  Decreased Interest 0 0 0  Down, Depressed, Hopeless 0 0 0  PHQ - 2 Score 0 0 0     Fall Risk: Fall Risk  10/10/2015 06/20/2015  Falls  in the past year? No No     Assessment & Plan  1. Primary osteoarthritis of both knees  Seeing Dr. Berenice Macdonald and needs pre-op clearance for total right knee replacement to be done end of May or June  2. Hypertension, benign  bp is low - has a history of hyperaldosteronism and recent surgery, bp has been low, we will decreased dose of Exforge to half dose and recheck next week  3. Asthma, well controlled, moderate persistent  Doing better, only using rescue inhaler a couple of times a week   4. History of benign neoplasm of adrenal gland   5. Pre-op examination  [facility] on [facility]/28/2016 and it was normal, tolerated recent surgery on 02/11/2016 without any complication. He has anemia since last surgery, likely from blood loss. Advised to take ferrous sulfate, it will be best if hgb above 11 prior to elective knee surgery   6. Anemia associated with acute blood loss  We will recheck labs in 4 weeks - ferrous sulfate 325 (65 FE) MG tablet; Take 1 tablet (325 mg total) by mouth daily with breakfast.  Dispense: 30 tablet; Refill: 2 - Hemoglobin and hematocrit, blood - Iron, TIBC and Ferritin Panel  7. Indigestion  - omeprazole (PRILOSEC) 40 MG capsule; Take 1 capsule (40 mg total) by mouth every morning.  Dispense: 90 capsule; Refill: 0  7. Indigestion  Doing better, taking Omeprazole, advised to skip doses of Diclofenac or take it with food

## 2016-03-28 ENCOUNTER — Telehealth: Payer: Self-pay | Admitting: Family Medicine

## 2016-03-28 NOTE — Telephone Encounter (Signed)
[name] from [place] received paperwork for his surgical clearance however it did not state that he was cleared. Please return call [phone]

## 2016-03-28 NOTE — Telephone Encounter (Signed)
He is not cleared until next hgb level

## 2016-03-28 NOTE — Telephone Encounter (Signed)
Asked patient when he was getting his Hgb checked and states Dr. Ancil Boozer told him to come at the end of the month to have it done. Also while on the phone patient had a question, he has been splitting his Amlodipine-Valsartan pills for the past 2 days. With doing this his BP while at work being checked by nurses have been 126/81 and he has been dizzy. Should he continue taking his BP medication or still continue doing half a pill daily?

## 2016-03-30 NOTE — Telephone Encounter (Signed)
BP is at goal, so continue current dose, but I can refer him to a neurologist to check on his dizziness. It does not seem to be inner ear problem

## 2016-04-02 ENCOUNTER — Ambulatory Visit: Payer: Medicare Other

## 2016-04-02 VITALS — BP 104/64

## 2016-04-02 DIAGNOSIS — Z0130 Encounter for examination of blood pressure without abnormal findings: Secondary | ICD-10-CM

## 2016-04-02 MED ORDER — VALSARTAN 80 MG PO TABS: 80.0000 mg | ORAL_TABLET | Freq: Every day | ORAL | 0 refills | Status: DC

## 2016-04-02 NOTE — Patient Instructions (Signed)
Start Diovan 80 mg and return in 1 week 04/14/16 to have B/P rechecked

## 2016-04-14 ENCOUNTER — Ambulatory Visit: Payer: Medicare Other

## 2016-04-14 ENCOUNTER — Ambulatory Visit: Payer: BC Managed Care – PPO | Admitting: Family Medicine

## 2016-04-14 VITALS — BP 124/76 | HR 71

## 2016-04-14 DIAGNOSIS — I10 Essential (primary) hypertension: Secondary | ICD-10-CM

## 2016-04-14 MED ORDER — VALSARTAN 80 MG PO TABS: 80.0000 mg | ORAL_TABLET | Freq: Every day | ORAL | 6 refills | Status: DC

## 2016-04-14 NOTE — Progress Notes (Signed)
Patient denies any symptoms on valsartan 80 mg 1 tablet a day. Denies any dizziness.

## 2016-04-16 ENCOUNTER — Other Ambulatory Visit: Payer: Self-pay | Admitting: Family Medicine

## 2016-04-16 DIAGNOSIS — I10 Essential (primary) hypertension: Secondary | ICD-10-CM

## 2016-04-16 NOTE — Telephone Encounter (Signed)
Patient requesting refill of Amlodipine-Valsartan to [facility].

## 2016-04-23 ENCOUNTER — Ambulatory Visit: Payer: Medicare Other | Admitting: Family Medicine

## 2016-04-24 ENCOUNTER — Other Ambulatory Visit: Payer: Self-pay | Admitting: Family Medicine

## 2016-04-24 DIAGNOSIS — D62 Acute posthemorrhagic anemia: Secondary | ICD-10-CM | POA: Diagnosis not present

## 2016-04-25 LAB — HEMOGLOBIN: Hemoglobin: 10.1 g/dL — ABNORMAL LOW (ref 13.0–17.7)

## 2016-04-25 LAB — IRON AND TIBC
IRON SATURATION: 20 % (ref 15–55)
Iron: 50 ug/dL (ref 38–169)
Total Iron Binding Capacity: 256 ug/dL (ref 250–450)
UIBC: 206 ug/dL (ref 111–343)

## 2016-04-25 LAB — FERRITIN: FERRITIN: 631 ng/mL — AB (ref 30–400)

## 2016-04-25 LAB — HEMATOCRIT: Hematocrit: 31.1 % — ABNORMAL LOW (ref 37.5–51.0)

## 2016-04-30 ENCOUNTER — Telehealth: Payer: Self-pay | Admitting: *Deleted

## 2016-04-30 ENCOUNTER — Other Ambulatory Visit: Payer: Self-pay | Admitting: Family Medicine

## 2016-04-30 ENCOUNTER — Telehealth: Payer: Self-pay

## 2016-04-30 DIAGNOSIS — Z01818 Encounter for other preprocedural examination: Secondary | ICD-10-CM

## 2016-04-30 DIAGNOSIS — R7989 Other specified abnormal findings of blood chemistry: Secondary | ICD-10-CM | POA: Insufficient documentation

## 2016-04-30 DIAGNOSIS — D649 Anemia, unspecified: Secondary | ICD-10-CM | POA: Insufficient documentation

## 2016-04-30 DIAGNOSIS — Z862 Personal history of diseases of the blood and blood-forming organs and certain disorders involving the immune mechanism: Secondary | ICD-10-CM

## 2016-04-30 NOTE — Telephone Encounter (Addendum)
Dr Ancil Boozer is requesting to speak with Dr Janese Banks regarding this patient who is being seen for the first time 05/15/16 for elevated Ferritin in pt with history of IDA. She said it can wait until tomorrow when Dr Janese Banks is back in the office. [phone]

## 2016-04-30 NOTE — Telephone Encounter (Signed)
I called this patient to inform him that he has an appt to see Dr. Janese Banks on 05/15/16 @ 1:30pm at the [facility], but he was not in.  I spoke with his wife and she asked if Dr. Ancil Boozer has spoken with Dr. Holley Raring. Dr. Ancil Boozer then spoke with the wife and informed her that Dr. Holley Raring is a nephrologists not a hematologist which is who her husband needed to see due to his recent lab results.  She was given the information for the appt and their number then she was told that Dr. Ancil Boozer was going to give their office a call at [phone] to see if they can see him sooner.  She said ok and thanks

## 2016-05-01 NOTE — Telephone Encounter (Signed)
Can you move his appointment up to next week sometime?

## 2016-05-01 NOTE — Telephone Encounter (Signed)
Moved pt appt from 5/10 to 4/30 at 2:30 and he was told the day and time and I texted him the directions to his phone

## 2016-05-05 ENCOUNTER — Inpatient Hospital Stay: Payer: Medicare Other

## 2016-05-05 ENCOUNTER — Encounter: Payer: Self-pay | Admitting: Oncology

## 2016-05-05 ENCOUNTER — Inpatient Hospital Stay: Payer: Medicare Other | Attending: Oncology | Admitting: Oncology

## 2016-05-05 VITALS — BP 117/70 | HR 91 | Temp 96.5°F | Resp 18 | Ht 74.02 in | Wt 265.9 lb

## 2016-05-05 DIAGNOSIS — Z8619 Personal history of other infectious and parasitic diseases: Secondary | ICD-10-CM | POA: Diagnosis not present

## 2016-05-05 DIAGNOSIS — E785 Hyperlipidemia, unspecified: Secondary | ICD-10-CM | POA: Diagnosis not present

## 2016-05-05 DIAGNOSIS — D649 Anemia, unspecified: Secondary | ICD-10-CM

## 2016-05-05 DIAGNOSIS — R739 Hyperglycemia, unspecified: Secondary | ICD-10-CM | POA: Diagnosis not present

## 2016-05-05 DIAGNOSIS — Z8669 Personal history of other diseases of the nervous system and sense organs: Secondary | ICD-10-CM | POA: Insufficient documentation

## 2016-05-05 DIAGNOSIS — I10 Essential (primary) hypertension: Secondary | ICD-10-CM | POA: Diagnosis not present

## 2016-05-05 DIAGNOSIS — M170 Bilateral primary osteoarthritis of knee: Secondary | ICD-10-CM

## 2016-05-05 DIAGNOSIS — R42 Dizziness and giddiness: Secondary | ICD-10-CM | POA: Diagnosis not present

## 2016-05-05 DIAGNOSIS — Z79899 Other long term (current) drug therapy: Secondary | ICD-10-CM | POA: Insufficient documentation

## 2016-05-05 DIAGNOSIS — E896 Postprocedural adrenocortical (-medullary) hypofunction: Secondary | ICD-10-CM | POA: Diagnosis not present

## 2016-05-05 DIAGNOSIS — D638 Anemia in other chronic diseases classified elsewhere: Secondary | ICD-10-CM | POA: Insufficient documentation

## 2016-05-05 DIAGNOSIS — Z87891 Personal history of nicotine dependence: Secondary | ICD-10-CM

## 2016-05-05 DIAGNOSIS — D509 Iron deficiency anemia, unspecified: Secondary | ICD-10-CM | POA: Diagnosis not present

## 2016-05-05 DIAGNOSIS — K219 Gastro-esophageal reflux disease without esophagitis: Secondary | ICD-10-CM | POA: Insufficient documentation

## 2016-05-05 DIAGNOSIS — N400 Enlarged prostate without lower urinary tract symptoms: Secondary | ICD-10-CM | POA: Diagnosis not present

## 2016-05-05 DIAGNOSIS — N529 Male erectile dysfunction, unspecified: Secondary | ICD-10-CM

## 2016-05-05 DIAGNOSIS — R634 Abnormal weight loss: Secondary | ICD-10-CM | POA: Diagnosis not present

## 2016-05-05 DIAGNOSIS — J45909 Unspecified asthma, uncomplicated: Secondary | ICD-10-CM | POA: Insufficient documentation

## 2016-05-05 DIAGNOSIS — M545 Low back pain: Secondary | ICD-10-CM | POA: Insufficient documentation

## 2016-05-05 DIAGNOSIS — M1990 Unspecified osteoarthritis, unspecified site: Secondary | ICD-10-CM | POA: Diagnosis not present

## 2016-05-05 DIAGNOSIS — E291 Testicular hypofunction: Secondary | ICD-10-CM | POA: Diagnosis not present

## 2016-05-05 LAB — COMPREHENSIVE METABOLIC PANEL
ALT: 23 U/L (ref 17–63)
AST: 26 U/L (ref 15–41)
Albumin: 4.1 g/dL (ref 3.5–5.0)
Alkaline Phosphatase: 92 U/L (ref 38–126)
Anion gap: 8 (ref 5–15)
BUN: 50 mg/dL — AB (ref 6–20)
CALCIUM: 9.3 mg/dL (ref 8.9–10.3)
CO2: 24 mmol/L (ref 22–32)
Chloride: 103 mmol/L (ref 101–111)
Creatinine, Ser: 1.97 mg/dL — ABNORMAL HIGH (ref 0.61–1.24)
GFR calc Af Amer: 39 mL/min — ABNORMAL LOW (ref >60)
GFR, EST NON AFRICAN AMERICAN: 34 mL/min — AB (ref >60)
Glucose, Bld: 105 mg/dL — ABNORMAL HIGH (ref 65–99)
POTASSIUM: 5 mmol/L (ref 3.5–5.1)
Sodium: 135 mmol/L (ref 135–145)
TOTAL PROTEIN: 7.9 g/dL (ref 6.5–8.1)
Total Bilirubin: 0.4 mg/dL (ref 0.3–1.2)

## 2016-05-05 LAB — CBC WITH DIFFERENTIAL/PLATELET
Basophils Absolute: 0.1 10*3/uL (ref 0–0.1)
Basophils Relative: 1 %
EOS PCT: 4 %
Eosinophils Absolute: 0.3 10*3/uL (ref 0–0.7)
HCT: 30.4 % — ABNORMAL LOW (ref 40.0–52.0)
Hemoglobin: 10.1 g/dL — ABNORMAL LOW (ref 13.0–18.0)
LYMPHS ABS: 2.5 10*3/uL (ref 1.0–3.6)
LYMPHS PCT: 35 %
MCH: 28.8 pg (ref 26.0–34.0)
MCHC: 33.2 g/dL (ref 32.0–36.0)
MCV: 86.6 fL (ref 80.0–100.0)
MONO ABS: 0.7 10*3/uL (ref 0.2–1.0)
Monocytes Relative: 10 %
Neutro Abs: 3.7 10*3/uL (ref 1.4–6.5)
Neutrophils Relative %: 50 %
PLATELETS: 234 10*3/uL (ref 150–440)
RBC: 3.51 MIL/uL — ABNORMAL LOW (ref 4.40–5.90)
RDW: 15.1 % — AB (ref 11.5–14.5)
WBC: 7.3 10*3/uL (ref 3.8–10.6)

## 2016-05-05 LAB — RETICULOCYTES
RBC.: 3.54 MIL/uL — AB (ref 4.40–5.90)
RETIC COUNT ABSOLUTE: 42.5 10*3/uL (ref 19.0–183.0)
RETIC CT PCT: 1.2 % (ref 0.4–3.1)

## 2016-05-05 LAB — IRON AND TIBC
Iron: 56 ug/dL (ref 45–182)
Saturation Ratios: 20 % (ref 17.9–39.5)
TIBC: 281 ug/dL (ref 250–450)
UIBC: 225 ug/dL

## 2016-05-05 LAB — DAT, POLYSPECIFIC AHG (ARMC ONLY): POLYSPECIFIC AHG TEST: NEGATIVE

## 2016-05-05 LAB — SEDIMENTATION RATE: SED RATE: 46 mm/h — AB (ref 0–20)

## 2016-05-05 LAB — FERRITIN: Ferritin: 430 ng/mL — ABNORMAL HIGH (ref 24–336)

## 2016-05-05 LAB — FOLATE: Folate: 15.3 ng/mL (ref >5.9)

## 2016-05-05 NOTE — Progress Notes (Signed)
Here for new pt evaluation.  

## 2016-05-05 NOTE — Progress Notes (Signed)
 Hematology/Oncology Consult note [facility] [phone] [phone]  Patient Care Team: Krichna Sowles, MD as PCP - General (Family Medicine)   Name of the patient: [name]  [id number]  08/22/1950    Reason for referral- iron deficiency anemia   Referring physician- Krichna Sowles  Date of visit: 05/05/16   History of presenting illness- Patient is a 66 yr old male who is scheduled to undergo right total arthroplasty next month. He has been referred to us for his anemia prior to upcoming surgery. Recent cbc from 03/11/16 showed wbc of 6.5, H/H of 10.3/31.6 and platelet count of 210. TSH was normal at 1.63. LFTs were normal. Iron studies showed normal TIBC of 256, normal iron of 50 and iron saturation of 20%. Ferritin was levetaed at 631. 10 months prior to that ferritin was normal. 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 Other PMH includes HTN, OA, asthma.    Patient reports feeling sweaty and light headed sometimes. States he has lost about 30 pounds of weight since surgery and is slowly stabilizing.   ECOG PS- 0  Pain scale- 0   Review of systems- Review of Systems  Constitutional: Positive for malaise/fatigue and weight loss. Negative for chills and fever.  HENT: Negative for congestion, ear discharge and nosebleeds.   Eyes: Negative for blurred vision.  Respiratory: Negative for cough, hemoptysis, sputum production, shortness of breath and wheezing.   Cardiovascular: Negative for chest pain, palpitations, orthopnea and claudication.  Gastrointestinal: Negative for abdominal pain, blood in stool, constipation, diarrhea, heartburn, melena, nausea and vomiting.  Genitourinary: Negative for dysuria, flank pain, frequency, hematuria and urgency.  Musculoskeletal: Negative for back pain, joint pain and myalgias.  Skin: Negative for rash.  Neurological: Positive for weakness. Negative for  dizziness, tingling, focal weakness, seizures and headaches.  Endo/Heme/Allergies: Does not bruise/bleed easily.  Psychiatric/Behavioral: Negative for depression and suicidal ideas. The patient does not have insomnia.     No Known Allergies  Patient Active Problem List   Diagnosis Date Noted  . Elevated ferritin 04/30/2016  . Anemia, unspecified 04/30/2016  . History of benign neoplasm of adrenal gland 02/11/2016  . History of iron deficiency anemia 10/10/2015  . BPH (benign prostatic hyperplasia) 06/20/2015  . ED (erectile dysfunction) 06/20/2015  . Allergic rhinitis, seasonal 06/20/2015  . Anemia of chronic disease 06/20/2015  . Hypogonadism in male 06/20/2015  . Asthma, well controlled, moderate persistent 06/20/2015  . Hypertension, benign 06/20/2015  . Hyperglycemia 06/20/2015  . History of shingles 06/20/2015  . GERD without esophagitis 06/20/2015  . Chronic radicular low back pain 06/20/2015  . History of epilepsy 06/20/2015  . Migraine without aura and without status migrainosus, not intractable 06/20/2015  . Dyslipidemia 06/20/2015  . Primary osteoarthritis of both knees 06/20/2015     Past Medical History:  Diagnosis Date  . Allergic rhinitis   . Anemia   . BPH (benign prostatic hyperplasia)   . Chronic sinusitis   . Decreased libido   . ED (erectile dysfunction)   . Fatigue   . Hematuria   . HTN (hypertension)   . Hypogonadism in male   . Hypokalemia   . IBS (irritable bowel syndrome)   . Low serum vitamin D   . Lumbago   . Migraine   . Mild intermittent asthma   . Reflux   . Shingles   . Unilateral inguinal hernia without obstruction or gangrene      Past Surgical History:    Procedure Laterality Date  . ADRENALECTOMY Left 02/11/2016   [facility]  . COLONOSCOPY  02/2012   normal  . KNEE ARTHROSCOPY Left 10/06/2009    Social History   Social History  . Marital status: Married    Spouse name: N/A  . Number of children: N/A  . Years of education:  N/A   Occupational History  . Not on file.   Social History Main Topics  . Smoking status: Former Smoker  . Smokeless tobacco: Never Used  . Alcohol use No  . Drug use: No  . Sexual activity: Yes    Partners: Female   Other Topics Concern  . Not on file   Social History Narrative  . No narrative on file     Family History  Problem Relation Age of Onset  . Diabetes Mother   . Heart disease Mother   . Lung disease Mother   . Seizures Father      Current Outpatient Prescriptions:  .  acetaminophen (TYLENOL) 500 MG tablet, Take 1 tablet (500 mg total) by mouth every 8 (eight) hours as needed., Disp: 90 tablet, Rfl: 0 .  albuterol (PROAIR HFA) 108 (90 Base) MCG/ACT inhaler, Inhale 2 puffs into the lungs every 6 (six) hours as needed for wheezing or shortness of breath., Disp: 1 Inhaler, Rfl: 0 .  azelastine (OPTIVAR) 0.05 % ophthalmic solution, Place 2 drops into both eyes 2 (two) times daily., Disp: 6 mL, Rfl: 5 .  diclofenac (VOLTAREN) 75 MG EC tablet, Take 1 tablet by mouth 2 (two) times daily., Disp: , Rfl: 2 .  ferrous sulfate 325 (65 FE) MG tablet, Take 1 tablet (325 mg total) by mouth daily with breakfast., Disp: 30 tablet, Rfl: 2 .  fexofenadine (ALLEGRA) 60 MG tablet, Take 60 mg by mouth 2 (two) times daily., Disp: , Rfl:  .  fluticasone (FLONASE) 50 MCG/ACT nasal spray, Place 2 sprays into both nostrils daily., Disp: 16 g, Rfl: 5 .  fluticasone furoate-vilanterol (BREO ELLIPTA) 200-25 MCG/INH AEPB, Inhale 1 puff into the lungs daily., Disp: 60 each, Rfl: 5 .  montelukast (SINGULAIR) 10 MG tablet, Take 1 tablet (10 mg total) by mouth daily., Disp: 30 tablet, Rfl: 5 .  omeprazole (PRILOSEC) 40 MG capsule, Take 1 capsule (40 mg total) by mouth every morning., Disp: 90 capsule, Rfl: 0 .  tadalafil (CIALIS) 5 MG tablet, Take 1 tablet (5 mg total) by mouth daily., Disp: 30 tablet, Rfl: 5 .  tamsulosin (FLOMAX) 0.4 MG CAPS capsule, Take 1 capsule (0.4 mg total) by mouth daily.,  Disp: 30 capsule, Rfl: 5 .  traMADol (ULTRAM) 50 MG tablet, Take 50 mg by mouth every 8 (eight) hours. , Disp: , Rfl:  .  valsartan (DIOVAN) 80 MG tablet, Take 80 mg by mouth daily., Disp: , Rfl:  .  valsartan (DIOVAN) 80 MG tablet, Take 1 tablet (80 mg total) by mouth daily., Disp: 30 tablet, Rfl: 6   Physical exam:  Vitals:   05/05/16 1418  BP: 117/70  Pulse: 91  Resp: 18  Temp: (!) 96.5 F (35.8 C)  TempSrc: Tympanic  Weight: 265 lb 14 oz (120.6 kg)  Height: 6' 2.02" (1.88 m)   Physical Exam  Constitutional: He is oriented to person, place, and time and well-developed, well-nourished, and in no distress.  HENT:  Head: Normocephalic and atraumatic.  Eyes: EOM are normal. Pupils are equal, round, and reactive to light.  Neck: Normal range of motion.  Cardiovascular: Normal rate, regular rhythm and   normal heart sounds.   Pulmonary/Chest: Effort normal and breath sounds normal.  Abdominal: Soft. Bowel sounds are normal.  Surgical scar has healed well  Lymphadenopathy:  No palpable axillary, cervical or inguinal adenopathy  Neurological: He is alert and oriented to person, place, and time.  Skin: Skin is warm and dry.       CMP Latest Ref Rng & Units 03/11/2016  Glucose 65 - 99 mg/dL -  BUN 8 - 27 mg/dL -  Creatinine 0.76 - 1.27 mg/dL -  Sodium 134 - 144 mmol/L -  Potassium 3.5 - 5.2 mmol/L -  Chloride 96 - 106 mmol/L -  CO2 18 - 29 mmol/L -  Calcium 8.6 - 10.2 mg/dL -  Total Protein 6.1 - 8.1 g/dL 7.2  Total Bilirubin 0.2 - 1.2 mg/dL 0.4  Alkaline Phos 40 - 115 U/L 93  AST 10 - 35 U/L 21  ALT 9 - 46 U/L 20   CBC Latest Ref Rng & Units 04/24/2016  WBC 3.8 - 10.8 K/uL -  Hemoglobin 13.2 - 17.1 g/dL -  Hematocrit 37.5 - 51.0 % 31.1(L)  Platelets 140 - 400 K/uL -   Recent CT abdomen showed left adrenal nodule but no evidence of malignancy or adenopathy  Assessment and plan- Patient is a 66 y.o. male referred to us for normocytic anemia likely due to chronic  disease  Today I will obtain complete anemia work up including cbc with diff, ferritin and iron studies, B12, folate, retic count, haptoglobin, cmp, ESR, EPO level, coombs test and myeloma panel. I will see the patient back in 1 weeks time. If there is no obvious cause of anemia found on work up and patients hemoglobin has not normalized closer to 12, it may be worthwhile waiting until anemia improves. His hemoglobin was closer to 12 between nov 2017- feb 2018 prior to adrenal surgery to decrease the risk of post operative complications and need for post op blood transfusion . I can give him weekly EPO if his hb >10 and less than 13 until his surgery if he does not have other etiology of his anemia   Thank you for this kind referral and the opportunity to participate in the care of this patient   Visit Diagnosis 1. Normocytic anemia     Dr. Archana Rao, MD, MPH [facility] at [facility] Pager- [phone] 05/05/2016  3:06 PM                 

## 2016-05-06 ENCOUNTER — Encounter: Payer: Self-pay | Admitting: Oncology

## 2016-05-06 LAB — ERYTHROPOIETIN: Erythropoietin: 13.7 m[IU]/mL (ref 2.6–18.5)

## 2016-05-06 LAB — VITAMIN B12: Vitamin B-12: 1170 pg/mL — ABNORMAL HIGH (ref 180–914)

## 2016-05-06 LAB — HAPTOGLOBIN: Haptoglobin: 208 mg/dL — ABNORMAL HIGH (ref 34–200)

## 2016-05-08 LAB — MULTIPLE MYELOMA PANEL, SERUM
ALBUMIN SERPL ELPH-MCNC: 3.6 g/dL (ref 2.9–4.4)
ALPHA 1: 0.2 g/dL (ref 0.0–0.4)
ALPHA2 GLOB SERPL ELPH-MCNC: 0.7 g/dL (ref 0.4–1.0)
Albumin/Glob SerPl: 1.1 (ref 0.7–1.7)
B-GLOBULIN SERPL ELPH-MCNC: 1.1 g/dL (ref 0.7–1.3)
GAMMA GLOB SERPL ELPH-MCNC: 1.4 g/dL (ref 0.4–1.8)
GLOBULIN, TOTAL: 3.5 g/dL (ref 2.2–3.9)
IGA: 213 mg/dL (ref 61–437)
IGG (IMMUNOGLOBIN G), SERUM: 1361 mg/dL (ref 700–1600)
IgM, Serum: 49 mg/dL (ref 20–172)
Total Protein ELP: 7.1 g/dL (ref 6.0–8.5)

## 2016-05-12 ENCOUNTER — Inpatient Hospital Stay: Payer: Medicare Other | Attending: Oncology | Admitting: Oncology

## 2016-05-12 ENCOUNTER — Encounter: Payer: Self-pay | Admitting: Oncology

## 2016-05-12 ENCOUNTER — Telehealth: Payer: Self-pay | Admitting: Family Medicine

## 2016-05-12 VITALS — BP 112/73 | HR 88 | Temp 97.2°F | Ht 74.0 in | Wt 263.3 lb

## 2016-05-12 DIAGNOSIS — K4090 Unilateral inguinal hernia, without obstruction or gangrene, not specified as recurrent: Secondary | ICD-10-CM | POA: Diagnosis not present

## 2016-05-12 DIAGNOSIS — Z79899 Other long term (current) drug therapy: Secondary | ICD-10-CM | POA: Insufficient documentation

## 2016-05-12 DIAGNOSIS — M25569 Pain in unspecified knee: Secondary | ICD-10-CM | POA: Diagnosis not present

## 2016-05-12 DIAGNOSIS — E876 Hypokalemia: Secondary | ICD-10-CM

## 2016-05-12 DIAGNOSIS — D638 Anemia in other chronic diseases classified elsewhere: Secondary | ICD-10-CM

## 2016-05-12 DIAGNOSIS — Z87891 Personal history of nicotine dependence: Secondary | ICD-10-CM | POA: Diagnosis not present

## 2016-05-12 DIAGNOSIS — J45909 Unspecified asthma, uncomplicated: Secondary | ICD-10-CM | POA: Diagnosis not present

## 2016-05-12 DIAGNOSIS — D649 Anemia, unspecified: Secondary | ICD-10-CM | POA: Insufficient documentation

## 2016-05-12 DIAGNOSIS — N400 Enlarged prostate without lower urinary tract symptoms: Secondary | ICD-10-CM | POA: Diagnosis not present

## 2016-05-12 DIAGNOSIS — M1990 Unspecified osteoarthritis, unspecified site: Secondary | ICD-10-CM | POA: Diagnosis not present

## 2016-05-12 DIAGNOSIS — K219 Gastro-esophageal reflux disease without esophagitis: Secondary | ICD-10-CM | POA: Diagnosis not present

## 2016-05-12 DIAGNOSIS — R5383 Other fatigue: Secondary | ICD-10-CM | POA: Diagnosis not present

## 2016-05-12 DIAGNOSIS — R6882 Decreased libido: Secondary | ICD-10-CM | POA: Insufficient documentation

## 2016-05-12 DIAGNOSIS — M545 Low back pain: Secondary | ICD-10-CM | POA: Insufficient documentation

## 2016-05-12 DIAGNOSIS — K589 Irritable bowel syndrome without diarrhea: Secondary | ICD-10-CM | POA: Diagnosis not present

## 2016-05-12 DIAGNOSIS — J328 Other chronic sinusitis: Secondary | ICD-10-CM | POA: Insufficient documentation

## 2016-05-12 DIAGNOSIS — I10 Essential (primary) hypertension: Secondary | ICD-10-CM | POA: Diagnosis not present

## 2016-05-12 NOTE — Progress Notes (Signed)
Hematology/Oncology Consult note [facility]  Telephone:([phone] [phone]  Patient Care Team: Steele Sizer, MD as PCP - General (Family Medicine)   Name of the patient: [name]  [id number]  11-Feb-1950   Date of visit: 05/12/16  Diagnosis- anemia of chronic disease   Chief complaint/ Reason for visit- discuss results of bloodwork  Heme/Onc history: Patient is a 66 yr old male who is scheduled to undergo right total arthroplasty next month. He has been referred to [place] for his anemia prior to upcoming surgery. Recent cbc from 03/11/16 showed wbc of 6.5, H/H of 10.3/31.6 and platelet count of 210. TSH was normal at 1.63. LFTs were normal. Iron studies showed normal TIBC of 256, normal iron of 50 and iron saturation of 20%. Ferritin was levetaed at 631. 10 months prior to that ferritin was normal. 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 Other PMH includes HTN, OA, asthma.   Results of bloodwork from 05/05/2016 were as follows: CBC showed white count of 7.3, H&H of 10.1/30.4 and a platelet count of 234. CMP was normal except for elevated BUN of 50 and creatinine of 1.97. Ferritin was elevated at 430. Iron studies showed iron saturation of 20%. Coombs test was negative, folate level was normal and B12 level was elevated at 1170. ESR was elevated at 46. Myeloma panel did not reveal any monoclonal protein. epo level was 13.7   Interval history- feels fatigued. Wants to exercise but knee pain is limiting that   Review of systems- Review of Systems  Constitutional: Negative for chills, fever, malaise/fatigue and weight loss.  HENT: Negative for congestion, ear discharge and nosebleeds.   Eyes: Negative for blurred vision.  Respiratory: Negative for cough, hemoptysis, sputum production, shortness of breath and wheezing.   Cardiovascular: Negative for chest pain, palpitations, orthopnea and claudication.    Gastrointestinal: Negative for abdominal pain, blood in stool, constipation, diarrhea, heartburn, melena, nausea and vomiting.  Genitourinary: Negative for dysuria, flank pain, frequency, hematuria and urgency.  Musculoskeletal: Positive for joint pain. Negative for back pain and myalgias.  Skin: Negative for rash.  Neurological: Negative for dizziness, tingling, focal weakness, seizures, weakness and headaches.  Endo/Heme/Allergies: Does not bruise/bleed easily.  Psychiatric/Behavioral: Negative for depression and suicidal ideas. The patient does not have insomnia.       No Known Allergies   Past Medical History:  Diagnosis Date  . Allergic rhinitis   . Anemia   . BPH (benign prostatic hyperplasia)   . Chronic sinusitis   . Decreased libido   . ED (erectile dysfunction)   . Fatigue   . Hematuria   . HTN (hypertension)   . Hypogonadism in male   . Hypokalemia   . IBS (irritable bowel syndrome)   . Low serum vitamin D   . Lumbago   . Migraine   . Mild intermittent asthma   . Reflux   . Shingles   . Unilateral inguinal hernia without obstruction or gangrene      Past Surgical History:  Procedure Laterality Date  . ADRENALECTOMY Left 02/11/2016   [facility]  . COLONOSCOPY  02/2012   normal  . KNEE ARTHROSCOPY Left 10/06/2009  . SINUS EXPLORATION      Social History   Social History  . Marital status: Married    Spouse name: N/A  . Number of children: N/A  . Years of education: N/A   Occupational History  . Not on file.  Social History Main Topics  . Smoking status: Former Smoker    Packs/day: 1.00    Years: 10.00  . Smokeless tobacco: Never Used  . Alcohol use No  . Drug use: No  . Sexual activity: Yes    Partners: Female   Other Topics Concern  . Not on file   Social History Narrative  . No narrative on file    Family History  Problem Relation Age of Onset  . Diabetes Mother   . Heart disease Mother   . Lung disease Mother   . Seizures Maternal  Grandmother      Current Outpatient Prescriptions:  .  acetaminophen (TYLENOL) 500 MG tablet, Take 1 tablet (500 mg total) by mouth every 8 (eight) hours as needed., Disp: 90 tablet, Rfl: 0 .  albuterol (PROAIR HFA) 108 (90 Base) MCG/ACT inhaler, Inhale 2 puffs into the lungs every 6 (six) hours as needed for wheezing or shortness of breath. (Patient not taking: Reported on 05/05/2016), Disp: 1 Inhaler, Rfl: 0 .  azelastine (OPTIVAR) 0.05 % ophthalmic solution, Place 2 drops into both eyes 2 (two) times daily., Disp: 6 mL, Rfl: 5 .  diclofenac (VOLTAREN) 75 MG EC tablet, Take 1 tablet by mouth 2 (two) times daily., Disp: , Rfl: 2 .  ferrous sulfate 325 (65 FE) MG tablet, Take 1 tablet (325 mg total) by mouth daily with breakfast. (Patient not taking: Reported on 05/05/2016), Disp: 30 tablet, Rfl: 2 .  fexofenadine (ALLEGRA) 60 MG tablet, Take 60 mg by mouth 2 (two) times daily., Disp: , Rfl:  .  fluticasone (FLONASE) 50 MCG/ACT nasal spray, Place 2 sprays into both nostrils daily., Disp: 16 g, Rfl: 5 .  fluticasone furoate-vilanterol (BREO ELLIPTA) 200-25 MCG/INH AEPB, Inhale 1 puff into the lungs daily., Disp: 60 each, Rfl: 5 .  montelukast (SINGULAIR) 10 MG tablet, Take 1 tablet (10 mg total) by mouth daily., Disp: 30 tablet, Rfl: 5 .  omeprazole (PRILOSEC) 40 MG capsule, Take 1 capsule (40 mg total) by mouth every morning., Disp: 90 capsule, Rfl: 0 .  tadalafil (CIALIS) 5 MG tablet, Take 1 tablet (5 mg total) by mouth daily. (Patient not taking: Reported on 05/05/2016), Disp: 30 tablet, Rfl: 5 .  tamsulosin (FLOMAX) 0.4 MG CAPS capsule, Take 1 capsule (0.4 mg total) by mouth daily., Disp: 30 capsule, Rfl: 5 .  traMADol (ULTRAM) 50 MG tablet, Take 50 mg by mouth every 8 (eight) hours. , Disp: , Rfl:  .  valsartan (DIOVAN) 80 MG tablet, Take 1 tablet (80 mg total) by mouth daily., Disp: 30 tablet, Rfl: 6  Physical exam:  Vitals:   05/12/16 1512  BP: 112/73  Pulse: 88  Temp: 97.2 F (36.2 C)    TempSrc: Tympanic  Weight: 263 lb 5.4 oz (119.4 kg)  Height: 6' 2" (1.88 m)   Physical Exam  Constitutional: He is oriented to person, place, and time and well-developed, well-nourished, and in no distress.  HENT:  Head: Normocephalic and atraumatic.  Eyes: EOM are normal. Pupils are equal, round, and reactive to light.  Neck: Normal range of motion.  Cardiovascular: Normal rate, regular rhythm and normal heart sounds.   Pulmonary/Chest: Effort normal and breath sounds normal.  Abdominal: Soft. Bowel sounds are normal.  Neurological: He is alert and oriented to person, place, and time.  Skin: Skin is warm and dry.     CMP Latest Ref Rng & Units 05/05/2016  Glucose 65 - 99 mg/dL 105(H)  BUN 6 - 20  mg/dL 50(H)  Creatinine 0.61 - 1.24 mg/dL 1.97(H)  Sodium 135 - 145 mmol/L 135  Potassium 3.5 - 5.1 mmol/L 5.0  Chloride 101 - 111 mmol/L 103  CO2 22 - 32 mmol/L 24  Calcium 8.9 - 10.3 mg/dL 9.3  Total Protein 6.5 - 8.1 g/dL 7.9  Total Bilirubin 0.3 - 1.2 mg/dL 0.4  Alkaline Phos 38 - 126 U/L 92  AST 15 - 41 U/L 26  ALT 17 - 63 U/L 23   CBC Latest Ref Rng & Units 05/05/2016  WBC 3.8 - 10.6 K/uL 7.3  Hemoglobin 13.0 - 18.0 g/dL 10.1(L)  Hematocrit 40.0 - 52.0 % 30.4(L)  Platelets 150 - 440 K/uL 234      Assessment and plan- Patient is a 66 y.o. male referred to [place] for evaluation of anemia prior to undergoing knee replacement surgery  I discussed the results of bloodwork with the patient. Anemia work up is consistent with anemia of chronic disease. He does have some abnormal creatining on and off in the past as well. I discussed that EPO has now been FDA approved for patients with anemia of chronic disease prior to undergoing elective orthopedic procedures like knee replacement. Once we know the dats of his surgery, I would recommend giving him 40000 weekly X 4 doses Day -21, Day-14 Day -7 and on the day of surgery. We will aim to keep his hemoglobin close to 13 on the day of  surgery. This has been associated with lower morbidity and needs for post op blood transfusions. Patient will also need to be on oral iron 325 mg BID during this time although his iron studies presently are normal. He will need post operative pharmacologic DVT prophylaxis with prophylactic lovenox for 1 week. We will need to coordinate his epo shots based on his surgery date. We will check cbc with each dose of epo. I explained risks and benefits of EPO including all but not limited to risk of thromboses and stroke. Patient understands and agrees to proceed as planned.    Visit Diagnosis 1. Anemia of chronic disease      Dr. Randa Evens, MD, MPH [facility] at [facility] Pager- [phone] 05/12/2016 3:05 PM

## 2016-05-12 NOTE — Telephone Encounter (Signed)
Dr. Janese Banks called me today, explaining that Mr. [name] still has anemia, and he can get Epo injections starting 4 weeks prior to surgery to decrease the risk of complications. She will get a PA for medication and it would be nice to have the exact day for knee replacement surgery. We contacted [facility], Dr. Berenice Primas and we are waiting on his phone call to find out the date.

## 2016-05-12 NOTE — Progress Notes (Signed)
Patient here for follow up no changes since last appointment 

## 2016-05-15 ENCOUNTER — Telehealth: Payer: Self-pay | Admitting: Family Medicine

## 2016-05-15 ENCOUNTER — Ambulatory Visit: Payer: PRIVATE HEALTH INSURANCE | Admitting: Oncology

## 2016-05-15 NOTE — Telephone Encounter (Signed)
Pt's wife called wanting to know if Dr. Ancil Boozer had spoken with Dr. Janese Banks.  I informed patient that Dr. Ancil Boozer had spoken with Dr. Janese Banks in regards to the Epo Injections.  The information that is needed now is when the patient's actual surgery date is with Dr. Berenice Primas ([place]).  Mrs. Southern stated that she would have Karlton Lemon make himself an appointment with Dr. Berenice Primas to get a date and then they will communicate with both Dr. Ancil Boozer and Dr. Janese Banks.

## 2016-05-23 ENCOUNTER — Telehealth: Payer: Self-pay | Admitting: *Deleted

## 2016-05-23 NOTE — Telephone Encounter (Signed)
-----   Message from Wallene Dales sent at 05/23/2016 11:49 AM EDT ----- Regarding: Followup pt has surgery scheduled Contact: [phone] Pt surgery scheduled for June 15th, 2018. Needs to have shots completed before then. Last checkout note stated for pt to call to scheduled appt. Pt waiting for call back aout appt. Another number the pt can be reached at. [phone]  Thank you

## 2016-05-23 NOTE — Telephone Encounter (Signed)
Called and spoke to wife because pt. Still at work.  Told her the injections will be on 5/26, 6/1, 6/8 and the June 15 we will try to work out pt getting it the morning of surgery.  I will need to speak with Dr. Dorna Leitz and according to wife he will have surgery at [facility] or [facility]. Not sure which one yet.  The wife says he needs the appt late in the day because he is still trying to work.  I told I believe the latest is 3 pm but I will check with wife and let her know on Monday. She will wait for my call

## 2016-05-26 ENCOUNTER — Other Ambulatory Visit: Payer: Self-pay | Admitting: Orthopedic Surgery

## 2016-05-26 ENCOUNTER — Other Ambulatory Visit: Payer: Self-pay | Admitting: Oncology

## 2016-05-27 ENCOUNTER — Telehealth: Payer: Self-pay | Admitting: *Deleted

## 2016-05-27 NOTE — Telephone Encounter (Signed)
Called wife back and gave all the appt and she took them down as well as we will print off appt and when pt comes for 1st appt 5/25 he can pick up his schedule also. We are also in contact with md Dr. Dorna Leitz who will perform pt knee surgery so we can work out 6/15 injection the before surgery.

## 2016-05-30 ENCOUNTER — Inpatient Hospital Stay: Payer: Medicare Other

## 2016-05-30 ENCOUNTER — Inpatient Hospital Stay: Payer: Medicare Other | Admitting: *Deleted

## 2016-05-30 ENCOUNTER — Other Ambulatory Visit: Payer: Medicare Other

## 2016-05-30 ENCOUNTER — Ambulatory Visit: Payer: Medicare Other

## 2016-05-30 VITALS — BP 112/70 | HR 80

## 2016-05-30 DIAGNOSIS — M25569 Pain in unspecified knee: Secondary | ICD-10-CM | POA: Diagnosis not present

## 2016-05-30 DIAGNOSIS — D649 Anemia, unspecified: Secondary | ICD-10-CM | POA: Diagnosis not present

## 2016-05-30 DIAGNOSIS — M1990 Unspecified osteoarthritis, unspecified site: Secondary | ICD-10-CM | POA: Diagnosis not present

## 2016-05-30 DIAGNOSIS — J45909 Unspecified asthma, uncomplicated: Secondary | ICD-10-CM | POA: Diagnosis not present

## 2016-05-30 DIAGNOSIS — I10 Essential (primary) hypertension: Secondary | ICD-10-CM | POA: Diagnosis not present

## 2016-05-30 DIAGNOSIS — D638 Anemia in other chronic diseases classified elsewhere: Secondary | ICD-10-CM

## 2016-05-30 DIAGNOSIS — R5383 Other fatigue: Secondary | ICD-10-CM | POA: Diagnosis not present

## 2016-05-30 LAB — HEMOGLOBIN: Hemoglobin: 9.5 g/dL — ABNORMAL LOW (ref 13.0–18.0)

## 2016-05-30 MED ORDER — EPOETIN ALFA 40000 UNIT/ML IJ SOLN
40000.0000 [IU] | Freq: Once | INTRAMUSCULAR | Status: AC
  Administered 2016-05-30: 40000 [IU] via SUBCUTANEOUS
  Filled 2016-05-30: qty 1

## 2016-06-06 ENCOUNTER — Ambulatory Visit: Payer: Medicare Other

## 2016-06-06 ENCOUNTER — Inpatient Hospital Stay: Payer: Medicare Other | Attending: Oncology

## 2016-06-06 ENCOUNTER — Other Ambulatory Visit: Payer: Self-pay

## 2016-06-06 ENCOUNTER — Inpatient Hospital Stay: Payer: Medicare Other

## 2016-06-06 ENCOUNTER — Other Ambulatory Visit: Payer: Medicare Other

## 2016-06-06 VITALS — BP 121/75

## 2016-06-06 DIAGNOSIS — D638 Anemia in other chronic diseases classified elsewhere: Secondary | ICD-10-CM

## 2016-06-06 DIAGNOSIS — D649 Anemia, unspecified: Secondary | ICD-10-CM | POA: Diagnosis not present

## 2016-06-06 DIAGNOSIS — E876 Hypokalemia: Secondary | ICD-10-CM | POA: Insufficient documentation

## 2016-06-06 DIAGNOSIS — Z87891 Personal history of nicotine dependence: Secondary | ICD-10-CM | POA: Diagnosis not present

## 2016-06-06 DIAGNOSIS — Z79899 Other long term (current) drug therapy: Secondary | ICD-10-CM | POA: Insufficient documentation

## 2016-06-06 DIAGNOSIS — Z8669 Personal history of other diseases of the nervous system and sense organs: Secondary | ICD-10-CM | POA: Insufficient documentation

## 2016-06-06 DIAGNOSIS — Z8719 Personal history of other diseases of the digestive system: Secondary | ICD-10-CM | POA: Diagnosis not present

## 2016-06-06 DIAGNOSIS — M1990 Unspecified osteoarthritis, unspecified site: Secondary | ICD-10-CM | POA: Diagnosis not present

## 2016-06-06 DIAGNOSIS — M545 Low back pain: Secondary | ICD-10-CM | POA: Insufficient documentation

## 2016-06-06 DIAGNOSIS — Z8619 Personal history of other infectious and parasitic diseases: Secondary | ICD-10-CM | POA: Insufficient documentation

## 2016-06-06 DIAGNOSIS — J45909 Unspecified asthma, uncomplicated: Secondary | ICD-10-CM | POA: Insufficient documentation

## 2016-06-06 DIAGNOSIS — N400 Enlarged prostate without lower urinary tract symptoms: Secondary | ICD-10-CM | POA: Diagnosis not present

## 2016-06-06 DIAGNOSIS — I10 Essential (primary) hypertension: Secondary | ICD-10-CM | POA: Insufficient documentation

## 2016-06-06 LAB — HEMOGLOBIN: HEMOGLOBIN: 10.7 g/dL — AB (ref 13.0–18.0)

## 2016-06-06 MED ORDER — EPOETIN ALFA 40000 UNIT/ML IJ SOLN
40000.0000 [IU] | Freq: Once | INTRAMUSCULAR | Status: AC
  Administered 2016-06-06: 40000 [IU] via SUBCUTANEOUS
  Filled 2016-06-06: qty 1

## 2016-06-09 ENCOUNTER — Encounter (HOSPITAL_COMMUNITY): Payer: Self-pay

## 2016-06-09 ENCOUNTER — Ambulatory Visit (HOSPITAL_COMMUNITY)
Admission: RE | Admit: 2016-06-09 | Discharge: 2016-06-09 | Disposition: A | Discharge status: Home / Self Care | Payer: Medicare Other | Source: Ambulatory Visit | Attending: Orthopedic Surgery | Admitting: Orthopedic Surgery

## 2016-06-09 ENCOUNTER — Telehealth: Payer: Self-pay | Admitting: *Deleted

## 2016-06-09 ENCOUNTER — Encounter (HOSPITAL_COMMUNITY)
Admission: RE | Admit: 2016-06-09 | Discharge: 2016-06-09 | Disposition: A | Discharge status: Home / Self Care | Payer: Medicare Other | Source: Ambulatory Visit | Attending: Orthopedic Surgery | Admitting: Orthopedic Surgery

## 2016-06-09 DIAGNOSIS — D649 Anemia, unspecified: Secondary | ICD-10-CM | POA: Insufficient documentation

## 2016-06-09 DIAGNOSIS — J4520 Mild intermittent asthma, uncomplicated: Secondary | ICD-10-CM | POA: Insufficient documentation

## 2016-06-09 DIAGNOSIS — Z01818 Encounter for other preprocedural examination: Secondary | ICD-10-CM | POA: Insufficient documentation

## 2016-06-09 DIAGNOSIS — I10 Essential (primary) hypertension: Secondary | ICD-10-CM | POA: Diagnosis not present

## 2016-06-09 DIAGNOSIS — Z01812 Encounter for preprocedural laboratory examination: Secondary | ICD-10-CM | POA: Diagnosis not present

## 2016-06-09 DIAGNOSIS — Z9889 Other specified postprocedural states: Secondary | ICD-10-CM | POA: Insufficient documentation

## 2016-06-09 DIAGNOSIS — E269 Hyperaldosteronism, unspecified: Secondary | ICD-10-CM | POA: Diagnosis not present

## 2016-06-09 HISTORY — DX: Other complications of anesthesia, initial encounter: T88.59XA

## 2016-06-09 HISTORY — DX: Adverse effect of unspecified anesthetic, initial encounter: T41.45XA

## 2016-06-09 LAB — COMPREHENSIVE METABOLIC PANEL
ALK PHOS: 83 U/L (ref 38–126)
ALT: 21 U/L (ref 17–63)
AST: 22 U/L (ref 15–41)
Albumin: 4.2 g/dL (ref 3.5–5.0)
Anion gap: 10 (ref 5–15)
BUN: 35 mg/dL — AB (ref 6–20)
CALCIUM: 10 mg/dL (ref 8.9–10.3)
CHLORIDE: 103 mmol/L (ref 101–111)
CO2: 24 mmol/L (ref 22–32)
CREATININE: 2.17 mg/dL — AB (ref 0.61–1.24)
GFR, EST AFRICAN AMERICAN: 35 mL/min — AB (ref >60)
GFR, EST NON AFRICAN AMERICAN: 30 mL/min — AB (ref >60)
Glucose, Bld: 108 mg/dL — ABNORMAL HIGH (ref 65–99)
Potassium: 5.4 mmol/L — ABNORMAL HIGH (ref 3.5–5.1)
Sodium: 137 mmol/L (ref 135–145)
Total Bilirubin: 0.4 mg/dL (ref 0.3–1.2)
Total Protein: 7.8 g/dL (ref 6.5–8.1)

## 2016-06-09 LAB — CBC WITH DIFFERENTIAL/PLATELET
BASOS ABS: 0 10*3/uL (ref 0.0–0.1)
Basophils Relative: 0 %
Eosinophils Absolute: 0.2 10*3/uL (ref 0.0–0.7)
Eosinophils Relative: 3 %
HEMATOCRIT: 36 % — AB (ref 39.0–52.0)
HEMOGLOBIN: 11.2 g/dL — AB (ref 13.0–17.0)
LYMPHS PCT: 32 %
Lymphs Abs: 2.1 10*3/uL (ref 0.7–4.0)
MCH: 28.8 pg (ref 26.0–34.0)
MCHC: 31.1 g/dL (ref 30.0–36.0)
MCV: 92.5 fL (ref 78.0–100.0)
Monocytes Absolute: 0.4 10*3/uL (ref 0.1–1.0)
Monocytes Relative: 6 %
NEUTROS ABS: 3.9 10*3/uL (ref 1.7–7.7)
NEUTROS PCT: 59 %
PLATELETS: 263 10*3/uL (ref 150–400)
RBC: 3.89 MIL/uL — AB (ref 4.22–5.81)
RDW: 15.3 % (ref 11.5–15.5)
WBC: 6.7 10*3/uL (ref 4.0–10.5)

## 2016-06-09 LAB — TYPE AND SCREEN
ABO/RH(D): O POS
Antibody Screen: NEGATIVE

## 2016-06-09 LAB — URINALYSIS, ROUTINE W REFLEX MICROSCOPIC
Bilirubin Urine: NEGATIVE
GLUCOSE, UA: NEGATIVE mg/dL
Hgb urine dipstick: NEGATIVE
Ketones, ur: NEGATIVE mg/dL
LEUKOCYTES UA: NEGATIVE
NITRITE: NEGATIVE
Protein, ur: NEGATIVE mg/dL
Specific Gravity, Urine: 1.023 (ref 1.005–1.030)
pH: 5 (ref 5.0–8.0)

## 2016-06-09 LAB — APTT: APTT: 31 s (ref 24–36)

## 2016-06-09 LAB — SURGICAL PCR SCREEN
MRSA, PCR: NEGATIVE
Staphylococcus aureus: POSITIVE — AB

## 2016-06-09 LAB — ABO/RH: ABO/RH(D): O POS

## 2016-06-09 LAB — PROTIME-INR
INR: 1.09
PROTHROMBIN TIME: 14.1 s (ref 11.4–15.2)

## 2016-06-09 NOTE — Pre-Procedure Instructions (Signed)
Shogo Larkey Beckers  06/09/2016      [id number] - [place], [place] [address] Phone: [phone] Fax: [phone]    Your procedure is scheduled on June 15  Report to [facility] at 0800 A.M.  Call this number if you have problems the morning of surgery:  [phone]   Remember:  Do not eat food or drink liquids after midnight.   Take these medicines the morning of surgery with A SIP OF WATER albuterol (PROAIR HFA) (bring inhalers with you the day of surgery), eye drops if needed, fexofenadine (ALLEGRA), fluticasone (FLONASE), montelukast (SINGULAIR), traMADol (ULTRAM) if needed   7 days prior to surgery STOP taking any Aspirin, Aleve, Naproxen, Ibuprofen, Motrin, Advil, Goody's, BC's, all herbal medications, fish oil, and all vitamins   Do not wear jewelry, make-up or nail polish.  Do not wear lotions, powders, or perfumes, or deoderant.  Do not shave 48 hours prior to surgery.  Men may shave face and neck.  Do not bring valuables to the hospital.  [facility] is not responsible for any belongings or valuables.  Contacts, dentures or bridgework may not be worn into surgery.  Leave your suitcase in the car.  After surgery it may be brought to your room.  For patients admitted to the hospital, discharge time will be determined by your treatment team.  Patients discharged the day of surgery will not be allowed to drive home.    Special instructions:   [place]- Preparing For Surgery  Before surgery, you can play an important role. Because skin is not sterile, your skin needs to be as free of germs as possible. You can reduce the number of germs on your skin by washing with CHG (chlorahexidine gluconate) Soap before surgery.  CHG is an antiseptic cleaner which kills germs and bonds with the skin to continue killing germs even after washing.  Please do not use if you have an allergy to CHG or  antibacterial soaps. If your skin becomes reddened/irritated stop using the CHG.  Do not shave (including legs and underarms) for at least 48 hours prior to first CHG shower. It is OK to shave your face.  Please follow these instructions carefully.   1. Shower the NIGHT BEFORE SURGERY and the MORNING OF SURGERY with CHG.   2. If you chose to wash your hair, wash your hair first as usual with your normal shampoo.  3. After you shampoo, rinse your hair and body thoroughly to remove the shampoo.  4. Use CHG as you would any other liquid soap. You can apply CHG directly to the skin and wash gently with a scrungie or a clean washcloth.   5. Apply the CHG Soap to your body ONLY FROM THE NECK DOWN.  Do not use on open wounds or open sores. Avoid contact with your eyes, ears, mouth and genitals (private parts). Wash genitals (private parts) with your normal soap.  6. Wash thoroughly, paying special attention to the area where your surgery will be performed.  7. Thoroughly rinse your body with warm water from the neck down.  8. DO NOT shower/wash with your normal soap after using and rinsing off the CHG Soap.  9. Pat yourself dry with a CLEAN TOWEL.   10. Wear CLEAN PAJAMAS   11. Place CLEAN SHEETS on your bed the night of your first shower and DO NOT SLEEP WITH PETS.    Day of  Surgery: Do not apply any deodorants/lotions. Please wear clean clothes to the hospital/surgery center.      Please read over the following fact sheets that you were given.

## 2016-06-09 NOTE — Progress Notes (Signed)
PCP - Ellis Parents Cardiologist - denies  Chest x-ray - 06/09/16 EKG - requesting  Stress Test - denies ECHO - denies Cardiac Cath - denies  Requesting ekg sending to anesthesia for those records     Patient denies shortness of breath, fever, cough and chest pain at PAT appointment   Patient verbalized understanding of instructions that were given to them at the PAT appointment. Patient was also instructed that they will need to review over the PAT instructions again at home before surgery.

## 2016-06-09 NOTE — Telephone Encounter (Signed)
I had previously called to Dr. Berenice Primas  Office on 5/25 and Elmyra Ricks called me back on 5/26 and I explained to her that we are giving pt procrit weekly x 4 weeks to help with his anemia prior to having total knee  surgery and the last injection would need to given on the day of surgery.  Elmyra Ricks told me that she feels that the best thing to do is to call OR at [facility]. That is where pt having the surgery.  I called today and spoke with Sandy in [place] and she spoke to someone else and says that it sounds like an order would need to be put in for the shot as preop order if pt needs it prior to the surgery.  They feel that Dr. Berenice Primas will need to put that order in as far as preop order.  I have now  Called back to Dr. Berenice Primas office and asked for his nurse to call me back.

## 2016-06-09 NOTE — Progress Notes (Signed)
PCP - Hal Stoneking Cardiologist -Daneen Schick  Chest x-ray - not needed EKG - 09/14/15 Stress Test -denies  ECHO - 07/29/11  Cardiac Cath - 08/10/14  Sleep Study - > 10 years  CPAP - wears at night  Fasting Blood Sugar - all over the place Checks Blood Sugar __4___ times a day  Sending to anesthesia for review of history   Patient denies shortness of breath, fever, cough and chest pain at PAT appointment   Patient verbalized understanding of instructions that were given to them at the PAT appointment. Patient was also instructed that they will need to review over the PAT instructions again at home before surgery.

## 2016-06-10 ENCOUNTER — Telehealth: Payer: Self-pay | Admitting: Family Medicine

## 2016-06-10 DIAGNOSIS — M25561 Pain in right knee: Secondary | ICD-10-CM | POA: Diagnosis not present

## 2016-06-10 NOTE — Progress Notes (Addendum)
Anesthesia Chart Review:  Pt is a 66 year old male scheduled for R total knee arthroplasty on 06/20/2016 with Dorna Leitz, M.D.  - PCP is Steele Sizer, MD - Hematologist is Randa Evens, MD  PMH includes: HTN, anemia, mild intermittent asthma, hyperaldosteronism (s/p L adrenalectomy 02/11/16). Former smoker. BMI 34.  - Hospitalized [facility]/22-23/18 at [facility] for hyperkalemia, increased Cr (up to 1.82).   Anesthesia history includes: Patient reports he woke up during surgery twice in the past.  Medications include: Albuterol, Breo Ellipta, tadalafil, valsartan  Preoperative labs reviewed. Cr 2.17, BUN 35.  Appears pt usually has normal renal function, with a baseline Cr ~ 0.85.  Most recent Cr was 1.97 at a hem-onc visit 05/05/16, but it was not addressed.  - I notified PCP's office.  They will see him for eval of renal function tomorrow (06/11/16).  - I left voicemail for Elmyra Ricks in Dr. Berenice Primas' office about renal function - I spoke with pt.  He does not have any acute sx, taking fluids and urinating per normal.   CXR 06/09/16: No acute cardiopulmonary disease.  EKG 02/28/16 requested from [facility].  Report in care everywhere states NSR.   Willeen Cass, FNP-BC [facility]/Anesthesiology Phone: [phone] 06/10/2016 3:00 PM  Addendum:   Pt was evaluated by Raelyn Ensign, NP on 06/11/16. He was then referred to nephrology and saw Anthonette Legato, MD that same afternoon (notes in media tab).  NSAIDS are the suspected culprit for pt's acute renal failure.  These have been discontinued.  Renal US done on 06/12/16 was normal.  Dr. Elwyn Lade note indicates he will recheck pt's renal function early next week to determine if pt's kidney function has recovered sufficiently to have surgery.   Willeen Cass, FNP-BC [facility] Stay Surgical Center/Anesthesiology Phone: [phone] 06/13/2016 9:05 AM   Addendum:   Pt saw Dr. Holley Raring again 06/16/16.  Telephone note in Epic by Novella Olive, RN  indicates repeat Cr was 1.71 and Dr. Holley Raring felt pt could proceed with surgery.   If no changes, I anticipate pt can proceed with surgery as scheduled.   Willeen Cass, FNP-BC [facility] Stay Surgical Center/Anesthesiology Phone: [phone] 06/19/2016 9:17 AM

## 2016-06-10 NOTE — Telephone Encounter (Signed)
NP from [facility] called stating patient needs to be seen this week with someone. We are happy to schedule with Raquel Sarna, NP. NP from [facility] is contacting pt to inform him of lab results. She states once she talks to patient she will advise him to call our office to get in right away. Also she asked if someone could flag the lab results and forward them to [place].

## 2016-06-11 ENCOUNTER — Ambulatory Visit (INDEPENDENT_AMBULATORY_CARE_PROVIDER_SITE_OTHER): Payer: Medicare Other | Admitting: Family Medicine

## 2016-06-11 ENCOUNTER — Other Ambulatory Visit: Payer: Self-pay | Admitting: *Deleted

## 2016-06-11 ENCOUNTER — Other Ambulatory Visit: Payer: Self-pay | Admitting: Nephrology

## 2016-06-11 ENCOUNTER — Telehealth: Payer: Self-pay | Admitting: Family Medicine

## 2016-06-11 ENCOUNTER — Encounter: Payer: Self-pay | Admitting: Family Medicine

## 2016-06-11 VITALS — BP 110/68 | HR 110 | Temp 98.1°F | Resp 16 | Ht 74.0 in | Wt 266.2 lb

## 2016-06-11 DIAGNOSIS — N179 Acute kidney failure, unspecified: Secondary | ICD-10-CM | POA: Diagnosis not present

## 2016-06-11 DIAGNOSIS — N19 Unspecified kidney failure: Secondary | ICD-10-CM

## 2016-06-11 DIAGNOSIS — E875 Hyperkalemia: Secondary | ICD-10-CM | POA: Diagnosis not present

## 2016-06-11 DIAGNOSIS — I10 Essential (primary) hypertension: Secondary | ICD-10-CM | POA: Diagnosis not present

## 2016-06-11 DIAGNOSIS — D638 Anemia in other chronic diseases classified elsewhere: Secondary | ICD-10-CM

## 2016-06-11 NOTE — Telephone Encounter (Signed)
Spoke with CMA Glenard Haring for Dr. Holley Raring, Nephrology regarding patient's labs. She secured appointment for 1:20 this afternoon for the patient. Please fax labs from 06/09/2016, 05/05/2016, and 06/20/2015 to [phone], Attn: Glenard Haring.  Thank you so much!

## 2016-06-11 NOTE — Patient Instructions (Signed)
Dr. Holley Raring, Nephrology. Location: [address] [place]  [place], [place] [address] Appointment Time: 1:20, please arrive around 1:00. Please call us if you have any issues, - [phone].

## 2016-06-11 NOTE — Telephone Encounter (Signed)
Spoke with Dr. Holley Raring regarding plan of care for patient. He believes NSAID use is likely cause of renal function decline. He has planned with the patient to stop all NSAID use, and to follow up early next week. He will determine at this follow up appointment if patient is able to have his knee surgery on 06/20/2016 or not. Dr. Holley Raring will call the office to update.   I also placed a call to Dr. Berenice Primas, Orthopedist, to discuss this plan of care. Message left on his machine with my call back information. Will update once I speak with him.

## 2016-06-11 NOTE — Telephone Encounter (Signed)
Spoke with Dr. Berenice Primas and updated on plan of care. He requests to know if patient needs any steroidal support through surgery. Will forward this question to Dr. Holley Raring during our next telephone call.

## 2016-06-11 NOTE — Telephone Encounter (Signed)
Information faxed to [facility]. Copy will be given to patient

## 2016-06-11 NOTE — Progress Notes (Addendum)
Name: [name]   MRN: [id number]    DOB: 03-23-1950   Date:06/11/2016       Progress Note  Subjective  Chief Complaint  Chief Complaint  Patient presents with  . discuss lab results    kidney function levels    HPI  Pt presents to follow up on kidney function labs that were performed as a part of the pre-op visit for an upcoming knee surgery. Has been taking 622m Naproxen at lunch and taking 711mVoltaren PRN for pain.  He is advised to stop taking this medication until after her speaks with Dr. LaHolley Raringoday.   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 KiMaudie MercuryWas being seen by Dr. SoGabriel Carinaendocrinology for this issue.  Has been urinating less than usually since this surgery, but notes he has been drinking less water too. He is otherwise feeling well and has no complaints.  Patient Active Problem List   Diagnosis Date Noted  . Elevated ferritin 04/30/2016  . Anemia, unspecified 04/30/2016  . History of benign neoplasm of adrenal gland 02/11/2016  . History of iron deficiency anemia 10/10/2015  . BPH (benign prostatic hyperplasia) 06/20/2015  . ED (erectile dysfunction) 06/20/2015  . Allergic rhinitis, seasonal 06/20/2015  . Anemia of chronic disease 06/20/2015  . Hypogonadism in male 06/20/2015  . Asthma, well controlled, moderate persistent 06/20/2015  . Hypertension, benign 06/20/2015  . Hyperglycemia 06/20/2015  . History of shingles 06/20/2015  . GERD without esophagitis 06/20/2015  . Chronic radicular low back pain 06/20/2015  . History of epilepsy 06/20/2015  . Migraine without aura and without status migrainosus, not intractable 06/20/2015  . Dyslipidemia 06/20/2015  . Primary  osteoarthritis of both knees 06/20/2015    Social History  Substance Use Topics  . Smoking status: Former Smoker    Packs/day: 1.00    Years: 10.00  . Smokeless tobacco: Never Used     Comment: 38 years ago 3754hen he stopped  . Alcohol use No     Current Outpatient Prescriptions:  .  albuterol (PROAIR HFA) 108 (90 Base) MCG/ACT inhaler, Inhale 2 puffs into the lungs every 6 (six) hours as needed for wheezing or shortness of breath., Disp: 1 Inhaler, Rfl: 0 .  AMINO ACIDS PO, Take 1-2 tablets by mouth 2 (two) times daily. Take 2 tablets with lunch & 1 tablet in the evening. Essential Amino Energy, Disp: , Rfl:  .  azelastine (OPTIVAR) 0.05 % ophthalmic solution, Place 2 drops into both eyes 2 (two) times daily. (Patient taking differently: Place 1 drop into both eyes daily. ), Disp: 6 mL, Rfl: 5 .  diclofenac (VOLTAREN) 75 MG EC tablet, Take 1 tablet by mouth 2 (two) times daily., Disp: , Rfl: 2 .  ferrous sulfate 325 (65 FE) MG tablet, Take 1 tablet (325 mg total) by mouth daily with breakfast. (Patient not taking: Reported on 06/06/2016), Disp: 30 tablet, Rfl: 2 .  fexofenadine (ALLEGRA) 180 MG tablet, Take 180 mg by mouth daily as needed for allergies or rhinitis., Disp: , Rfl:  .  fexofenadine-pseudoephedrine (ALLEGRA-D 24) 180-240 MG 24 hr tablet, Take 1 tablet by mouth daily as needed (for allergies.)., Disp: , Rfl:  .  fluticasone (FLONASE) 50 MCG/ACT nasal spray, Place 2 sprays into both nostrils daily. (Patient taking differently: Place 2 sprays into both nostrils daily as needed (for allergies.). ), Disp: 16 g, Rfl: 5 .  fluticasone furoate-vilanterol (BREO ELLIPTA) 200-25 MCG/INH AEPB, Inhale 1 puff into the lungs daily., Disp: 60 each, Rfl: 5 .  Glucosamine-MSM-Hyaluronic Acd (JOINT HEALTH PO), Take 1 tablet by mouth daily. INSTAFLEX ADVANCED JOINT SUPPORT, Disp: , Rfl:  .  Misc Natural Products (NF FORMULAS TESTOSTERONE PO), Take 2 tablets by mouth daily. NATURE'S PLUS T-MALE  SUPPLEMENT, Disp: , Rfl:  .  montelukast (SINGULAIR) 10 MG tablet, Take 1 tablet (10 mg total) by mouth daily., Disp: 30 tablet, Rfl: 5 .  Multiple Vitamins-Minerals (MULTIVITAMIN ADULTS 50+ PO), Take 1 Package by mouth daily. NATURE'S CODE MEN OVER 50 MULTIVITAMIN PACK, Disp: , Rfl:  .  mupirocin ointment (BACTROBAN) 2 %, APPLY IN EACH NOSTRIL TWICE A DAY FOR 5 DAYS, Disp: , Rfl: 0 .  naproxen sodium (ANAPROX) 220 MG tablet, Take 660 mg by mouth daily with lunch., Disp: , Rfl:  .  NUTRITIONAL SUPPLEMENT LIQD, Take 1 scoop by mouth daily as needed (for energy support). Biot[employer] 45X Energizing Berry., Disp: , Rfl:  .  Nutritional Supplements (NUTRITIONAL SUPPLEMENT PO), Take 1 Bottle by mouth daily. Next Step Fit N Full Shake, Disp: , Rfl:  .  omeprazole (PRILOSEC) 40 MG capsule, Take 1 capsule (40 mg total) by mouth every morning. (Patient not taking: Reported on 06/06/2016), Disp: 90 capsule, Rfl: 0 .  OVER THE COUNTER MEDICATION, Take 1 tablet by mouth daily. TEST-HD TESTOSTERONE SUPPORT, Disp: , Rfl:  .  tadalafil (CIALIS) 5 MG tablet, Take 1 tablet (5 mg total) by mouth daily., Disp: 30 tablet, Rfl: 5 .  tamsulosin (FLOMAX) 0.4 MG CAPS capsule, Take 1 capsule (0.4 mg total) by mouth daily. (Patient taking differently: Take 0.4 mg by mouth every evening. ), Disp: 30 capsule, Rfl: 5 .  traMADol (ULTRAM) 50 MG tablet, Take 100 mg by mouth 4 (four) times daily as needed (for pain). , Disp: , Rfl:  .  valsartan (DIOVAN) 80 MG tablet, Take 1 tablet (80 mg total) by mouth daily., Disp: 30 tablet, Rfl: 6  Allergies  Allergen Reactions  . Food     DAIRY=causes migraines.    ROS  Ten systems reviewed and is negative except as mentioned in HPI  Objective  Vitals:   06/11/16 0915  BP: 110/68  Pulse: (!) 110  Resp: 16  Temp: 98.1 F (36.7 C)  SpO2: 97%  Weight: 266 lb 3 oz (120.7 kg)  Height: 6' 2"  (1.88 m)    Body mass index is 34.18 kg/m.  Nursing Note and Vital Signs  reviewed.  Physical Exam Constitutional: Patient appears well-developed and well-nourished. Obese, No distress.  HEENT: head atraumatic, normocephalic Cardiovascular: Normal rate, regular rhythm, S1/S2 present.  No murmur or rub heard. No BLE edema. Pulmonary/Chest: Effort normal and breath sounds clear. No respiratory distress or retractions. Abdominal: Soft and non-tender, bowel sounds present x4 quadrants. No CVA tenderness Psychiatric: Patient has a normal mood and affect. behavior is normal. Judgment and thought content normal.  Recent Results (from the past 2160 hour(s))  Iron and TIBC     Status: None   Collection Time: 04/24/16  4:05 PM  Result Value Ref Range   Total Iron Binding Capacity 256 250 - 450 ug/dL   UIBC 206 111 - 343 ug/dL   Iron 50 38 - 169 ug/dL  Iron Saturation 20 15 - 55 %  Ferritin     Status: Abnormal   Collection Time: 04/24/16  4:05 PM  Result Value Ref Range   Ferritin 631 (H) 30 - 400 ng/mL  Hemoglobin     Status: Abnormal   Collection Time: 04/24/16  4:05 PM  Result Value Ref Range   Hemoglobin 10.1 (L) 13.0 - 17.7 g/dL  Hematocrit     Status: Abnormal   Collection Time: 04/24/16  4:05 PM  Result Value Ref Range   Hematocrit 31.1 (L) 37.5 - 51.0 %  CBC with Differential/Platelet     Status: Abnormal   Collection Time: 05/05/16  2:58 PM  Result Value Ref Range   WBC 7.3 3.8 - 10.6 K/uL   RBC 3.51 (L) 4.40 - 5.90 MIL/uL   Hemoglobin 10.1 (L) 13.0 - 18.0 g/dL   HCT 30.4 (L) 40.0 - 52.0 %   MCV 86.6 80.0 - 100.0 fL   MCH 28.8 26.0 - 34.0 pg   MCHC 33.2 32.0 - 36.0 g/dL   RDW 15.1 (H) 11.5 - 14.5 %   Platelets 234 150 - 440 K/uL   Neutrophils Relative % 50 %   Neutro Abs 3.7 1.4 - 6.5 K/uL   Lymphocytes Relative 35 %   Lymphs Abs 2.5 1.0 - 3.6 K/uL   Monocytes Relative 10 %   Monocytes Absolute 0.7 0.2 - 1.0 K/uL   Eosinophils Relative 4 %   Eosinophils Absolute 0.3 0 - 0.7 K/uL   Basophils Relative 1 %   Basophils Absolute 0.1 0 - 0.1  K/uL  Comprehensive metabolic panel     Status: Abnormal   Collection Time: 05/05/16  2:58 PM  Result Value Ref Range   Sodium 135 135 - 145 mmol/L   Potassium 5.0 3.5 - 5.1 mmol/L   Chloride 103 101 - 111 mmol/L   CO2 24 22 - 32 mmol/L   Glucose, Bld 105 (H) 65 - 99 mg/dL   BUN 50 (H) 6 - 20 mg/dL   Creatinine, Ser 1.97 (H) 0.61 - 1.24 mg/dL   Calcium 9.3 8.9 - 10.3 mg/dL   Total Protein 7.9 6.5 - 8.1 g/dL   Albumin 4.1 3.5 - 5.0 g/dL   AST 26 15 - 41 U/L   ALT 23 17 - 63 U/L   Alkaline Phosphatase 92 38 - 126 U/L   Total Bilirubin 0.4 0.3 - 1.2 mg/dL   GFR calc non Af Amer 34 (L) >60 mL/min   GFR calc Af Amer 39 (L) >60 mL/min    Comment: (NOTE) The eGFR has been calculated using the CKD EPI equation. This calculation has not been validated in all clinical situations. eGFR's persistently <60 mL/min signify possible Chronic Kidney Disease.    Anion gap 8 5 - 15  Ferritin     Status: Abnormal   Collection Time: 05/05/16  2:58 PM  Result Value Ref Range   Ferritin 430 (H) 24 - 336 ng/mL  Folate     Status: None   Collection Time: 05/05/16  2:58 PM  Result Value Ref Range   Folate 15.3 >5.9 ng/mL  Iron and TIBC     Status: None   Collection Time: 05/05/16  2:58 PM  Result Value Ref Range   Iron 56 45 - 182 ug/dL   TIBC 281 250 - 450 ug/dL   Saturation Ratios 20 17.9 - 39.5 %   UIBC 225 ug/dL  Vitamin B12     Status: Abnormal  Collection Time: 05/05/16  2:58 PM  Result Value Ref Range   Vitamin B-12 1,170 (H) 180 - 914 pg/mL    Comment: (NOTE) This assay is not validated for testing neonatal or myeloproliferative syndrome specimens for Vitamin B12 levels. Performed at [facility], [place] [address], [place], [place] [address]   Reticulocytes     Status: Abnormal   Collection Time: 05/05/16  2:58 PM  Result Value Ref Range   Retic Ct Pct 1.2 0.4 - 3.1 %   RBC. 3.54 (L) 4.40 - 5.90 MIL/uL   Retic Count, Manual 42.5 19.0 - 183.0 K/uL  Haptoglobin     Status:  Abnormal   Collection Time: 05/05/16  2:58 PM  Result Value Ref Range   Haptoglobin 208 (H) 34 - 200 mg/dL    Comment: (NOTE) Performed At: [facility] [place], [place] [id number] Lindon Romp MD UD:[id number]   Sedimentation rate     Status: Abnormal   Collection Time: 05/05/16  2:58 PM  Result Value Ref Range   Sed Rate 46 (H) 0 - 20 mm/hr  Erythropoietin     Status: None   Collection Time: 05/05/16  2:58 PM  Result Value Ref Range   Erythropoietin 13.7 2.6 - 18.5 mIU/mL    Comment: (NOTE) [employer] UniCel DxI 800 Immunoassay System Performed At: [facility] [place], [place] [id number] Lindon Romp MD JO:[id number]   Multiple Myeloma Panel (SPEP&IFE w/QIG)     Status: None   Collection Time: 05/05/16  2:58 PM  Result Value Ref Range   IgG (Immunoglobin G), Serum 1,361 700 - 1,600 mg/dL   IgA 213 61 - 437 mg/dL   IgM, Serum 49 20 - 172 mg/dL   Total Protein ELP 7.1 6.0 - 8.5 g/dL   Albumin SerPl Elph-Mcnc 3.6 2.9 - 4.4 g/dL   Alpha 1 0.2 0.0 - 0.4 g/dL   Alpha2 Glob SerPl Elph-Mcnc 0.7 0.4 - 1.0 g/dL   B-Globulin SerPl Elph-Mcnc 1.1 0.7 - 1.3 g/dL   Gamma Glob SerPl Elph-Mcnc 1.4 0.4 - 1.8 g/dL   M Protein SerPl Elph-Mcnc Not Observed Not Observed g/dL   Globulin, Total 3.5 2.2 - 3.9 g/dL   Albumin/Glob SerPl 1.1 0.7 - 1.7   IFE 1 Comment     Comment: An apparent normal immunofixation pattern.   Please Note Comment     Comment: (NOTE) Protein electrophoresis scan will follow via computer, mail, or courier delivery. Performed At: [facility] [place], [place] [id number] Lindon Romp MD OQ:[id number]   DAT, polyspecific, AHG [facility])     Status: None   Collection Time: 05/05/16  2:58 PM  Result Value Ref Range   Polyspecific AHG test NEG   Hemoglobin     Status: Abnormal   Collection Time: 05/30/16  3:20 PM  Result Value Ref Range   Hemoglobin 9.5 (L) 13.0 - 18.0 g/dL   Hemoglobin [facility])     Status: Abnormal   Collection Time: 06/06/16  3:12 PM  Result Value Ref Range   Hemoglobin 10.7 (L) 13.0 - 18.0 g/dL  Surgical pcr screen     Status: Abnormal   Collection Time: 06/09/16  2:31 PM  Result Value Ref Range   MRSA, PCR NEGATIVE NEGATIVE   Staphylococcus aureus POSITIVE (A) NEGATIVE    Comment:        The Xpert SA Assay (FDA approved for NASAL specimens in patients over 21 years of  age), is one component of a comprehensive surveillance program.  Test performance has been validated by [facility] for patients greater than or equal to 27 year old. It is not intended to diagnose infection nor to guide or monitor treatment.   Urinalysis, Routine w reflex microscopic     Status: Abnormal   Collection Time: 06/09/16  2:31 PM  Result Value Ref Range   Color, Urine YELLOW YELLOW   APPearance HAZY (A) CLEAR   Specific Gravity, Urine 1.023 1.005 - 1.030   pH 5.0 5.0 - 8.0   Glucose, UA NEGATIVE NEGATIVE mg/dL   Hgb urine dipstick NEGATIVE NEGATIVE   Bilirubin Urine NEGATIVE NEGATIVE   Ketones, ur NEGATIVE NEGATIVE mg/dL   Protein, ur NEGATIVE NEGATIVE mg/dL   Nitrite NEGATIVE NEGATIVE   Leukocytes, UA NEGATIVE NEGATIVE  APTT     Status: None   Collection Time: 06/09/16  2:32 PM  Result Value Ref Range   aPTT 31 24 - 36 seconds  CBC WITH DIFFERENTIAL     Status: Abnormal   Collection Time: 06/09/16  2:32 PM  Result Value Ref Range   WBC 6.7 4.0 - 10.5 K/uL   RBC 3.89 (L) 4.22 - 5.81 MIL/uL   Hemoglobin 11.2 (L) 13.0 - 17.0 g/dL   HCT 36.0 (L) 39.0 - 52.0 %   MCV 92.5 78.0 - 100.0 fL   MCH 28.8 26.0 - 34.0 pg   MCHC 31.1 30.0 - 36.0 g/dL   RDW 15.3 11.5 - 15.5 %   Platelets 263 150 - 400 K/uL   Neutrophils Relative % 59 %   Neutro Abs 3.9 1.7 - 7.7 K/uL   Lymphocytes Relative 32 %   Lymphs Abs 2.1 0.7 - 4.0 K/uL   Monocytes Relative 6 %   Monocytes Absolute 0.4 0.1 - 1.0 K/uL   Eosinophils Relative 3 %   Eosinophils Absolute 0.2 0.0 -  0.7 K/uL   Basophils Relative 0 %   Basophils Absolute 0.0 0.0 - 0.1 K/uL  Comprehensive metabolic panel     Status: Abnormal   Collection Time: 06/09/16  2:32 PM  Result Value Ref Range   Sodium 137 135 - 145 mmol/L   Potassium 5.4 (H) 3.5 - 5.1 mmol/L   Chloride 103 101 - 111 mmol/L   CO2 24 22 - 32 mmol/L   Glucose, Bld 108 (H) 65 - 99 mg/dL   BUN 35 (H) 6 - 20 mg/dL   Creatinine, Ser 2.17 (H) 0.61 - 1.24 mg/dL   Calcium 10.0 8.9 - 10.3 mg/dL   Total Protein 7.8 6.5 - 8.1 g/dL   Albumin 4.2 3.5 - 5.0 g/dL   AST 22 15 - 41 U/L   ALT 21 17 - 63 U/L   Alkaline Phosphatase 83 38 - 126 U/L   Total Bilirubin 0.4 0.3 - 1.2 mg/dL   GFR calc non Af Amer 30 (L) >60 mL/min   GFR calc Af Amer 35 (L) >60 mL/min    Comment: (NOTE) The eGFR has been calculated using the CKD EPI equation. This calculation has not been validated in all clinical situations. eGFR's persistently <60 mL/min signify possible Chronic Kidney Disease.    Anion gap 10 5 - 15  Protime-INR     Status: None   Collection Time: 06/09/16  2:32 PM  Result Value Ref Range   Prothrombin Time 14.1 11.4 - 15.2 seconds   INR 1.09   Type and screen Order type and screen if day of surgery is  less than 15 days from draw of preadmission visit or order morning of surgery if day of surgery is greater than 6 days from preadmission visit.     Status: None   Collection Time: 06/09/16  2:45 PM  Result Value Ref Range   ABO/RH(D) O POS    Antibody Screen NEG    Sample Expiration 06/23/2016    Extend sample reason NO TRANSFUSIONS OR PREGNANCY IN THE PAST 3 MONTHS   ABO/Rh     Status: None   Collection Time: 06/09/16  2:45 PM  Result Value Ref Range   ABO/RH(D) O POS      Assessment & Plan  1. Renal failure, unspecified chronicity - Spoke with Dr. Elwyn Lade CMA, [place] Haring, this morning and she secured appointment for 1:20pm today. Dr. Holley Raring will call after this appointment to provide further instruction.  - Dr. Berenice Primas with Ortho  requests call as soon as we know whether or not pt ma proceed with surgery.  - Pt is informed of this plan of care. - Advised to stop Voltaren and Naproxen until seeing Dr. Holley Raring.  I have reviewed this encounter including the documentation in this note and/or discussed this patient with the Johney Maine, FNP, NP-C. I am certifying that I agree with the content of this note as supervising physician.  Steele Sizer, MD [facility] 07/02/2016, 5:02 PM

## 2016-06-12 ENCOUNTER — Other Ambulatory Visit: Payer: Self-pay | Admitting: Orthopedic Surgery

## 2016-06-12 ENCOUNTER — Ambulatory Visit
Admission: RE | Admit: 2016-06-12 | Discharge: 2016-06-12 | Disposition: A | Discharge status: Home / Self Care | Payer: Medicare Other | Source: Ambulatory Visit | Attending: Nephrology | Admitting: Nephrology

## 2016-06-12 DIAGNOSIS — N179 Acute kidney failure, unspecified: Secondary | ICD-10-CM | POA: Diagnosis not present

## 2016-06-13 ENCOUNTER — Inpatient Hospital Stay: Payer: Medicare Other

## 2016-06-13 ENCOUNTER — Ambulatory Visit: Payer: Medicare Other

## 2016-06-13 ENCOUNTER — Inpatient Hospital Stay (HOSPITAL_BASED_OUTPATIENT_CLINIC_OR_DEPARTMENT_OTHER): Payer: Medicare Other | Admitting: Oncology

## 2016-06-13 ENCOUNTER — Telehealth: Payer: Self-pay | Admitting: *Deleted

## 2016-06-13 ENCOUNTER — Other Ambulatory Visit: Payer: Medicare Other

## 2016-06-13 ENCOUNTER — Ambulatory Visit: Payer: Medicare Other | Admitting: Oncology

## 2016-06-13 VITALS — BP 108/73 | HR 96 | Temp 98.7°F | Wt 266.2 lb

## 2016-06-13 DIAGNOSIS — N189 Chronic kidney disease, unspecified: Secondary | ICD-10-CM

## 2016-06-13 DIAGNOSIS — Z79899 Other long term (current) drug therapy: Secondary | ICD-10-CM | POA: Diagnosis not present

## 2016-06-13 DIAGNOSIS — M1990 Unspecified osteoarthritis, unspecified site: Secondary | ICD-10-CM | POA: Diagnosis not present

## 2016-06-13 DIAGNOSIS — Z8669 Personal history of other diseases of the nervous system and sense organs: Secondary | ICD-10-CM

## 2016-06-13 DIAGNOSIS — D638 Anemia in other chronic diseases classified elsewhere: Secondary | ICD-10-CM

## 2016-06-13 DIAGNOSIS — Z8619 Personal history of other infectious and parasitic diseases: Secondary | ICD-10-CM | POA: Diagnosis not present

## 2016-06-13 DIAGNOSIS — J45909 Unspecified asthma, uncomplicated: Secondary | ICD-10-CM | POA: Diagnosis not present

## 2016-06-13 DIAGNOSIS — E876 Hypokalemia: Secondary | ICD-10-CM | POA: Diagnosis not present

## 2016-06-13 DIAGNOSIS — I10 Essential (primary) hypertension: Secondary | ICD-10-CM

## 2016-06-13 DIAGNOSIS — Z87891 Personal history of nicotine dependence: Secondary | ICD-10-CM | POA: Diagnosis not present

## 2016-06-13 DIAGNOSIS — Z8719 Personal history of other diseases of the digestive system: Secondary | ICD-10-CM

## 2016-06-13 DIAGNOSIS — M545 Low back pain: Secondary | ICD-10-CM

## 2016-06-13 DIAGNOSIS — N400 Enlarged prostate without lower urinary tract symptoms: Secondary | ICD-10-CM

## 2016-06-13 DIAGNOSIS — D631 Anemia in chronic kidney disease: Secondary | ICD-10-CM

## 2016-06-13 DIAGNOSIS — D649 Anemia, unspecified: Secondary | ICD-10-CM

## 2016-06-13 LAB — CBC
HEMATOCRIT: 34.6 % — AB (ref 40.0–52.0)
Hemoglobin: 11.5 g/dL — ABNORMAL LOW (ref 13.0–18.0)
MCH: 30 pg (ref 26.0–34.0)
MCHC: 33.1 g/dL (ref 32.0–36.0)
MCV: 90.6 fL (ref 80.0–100.0)
Platelets: 250 10*3/uL (ref 150–440)
RBC: 3.82 MIL/uL — ABNORMAL LOW (ref 4.40–5.90)
RDW: 15.6 % — AB (ref 11.5–14.5)
WBC: 6.2 10*3/uL (ref 3.8–10.6)

## 2016-06-13 MED ORDER — EPOETIN ALFA 40000 UNIT/ML IJ SOLN
40000.0000 [IU] | Freq: Once | INTRAMUSCULAR | Status: AC
  Administered 2016-06-13: 40000 [IU] via SUBCUTANEOUS
  Filled 2016-06-13: qty 1

## 2016-06-13 NOTE — Telephone Encounter (Signed)
Elmyra Ricks called back to say that the pt is having surgery at [facility].  She states that I need to call that [facility] OR and ask them how to enter in the injection.  I called the OR and spoke Keri and she states if I go into the encounter and go to manage orders then I can enter the procrit and put instrcutions in .  She also said she is sending a message to flow coordinator who is Mendel Ryder to make her aware also of the plan for the need for injection. I went into the system today and entered the order for inejction

## 2016-06-13 NOTE — Progress Notes (Signed)
Patient here today for follow up.  Patient recently seen nephrology for decreased kidney function

## 2016-06-15 ENCOUNTER — Encounter: Payer: Self-pay | Admitting: Oncology

## 2016-06-15 NOTE — Progress Notes (Signed)
Hematology/Oncology Consult note [facility]  [phone] [phone]  Patient Care Team: Steele Sizer, MD as PCP - General (Family Medicine)   Name of the patient: [name]  [id number]  1950-01-19   Date of visit: 06/15/16  Diagnosis- anemia secondary to chronic disease- HTN/recent surgery versus acute on chronic kidney disease  Chief complaint/ Reason for visit- f/u prior to dose 3 of procrit  Heme/Onc history: Patient is a 66 yr old male who is scheduled to undergo right total arthroplasty next month. He has been referred to [place] for his anemia prior to upcoming surgery. Recent cbc from 03/11/16 showed wbc of 6.5, H/H of 10.3/31.6 and platelet count of 210. TSH was normal at 1.63. LFTs were normal. Iron studies showed normal TIBC of 256, normal iron of 50 and iron saturation of 20%. Ferritin was levetaed at 631. 10 months prior to that ferritin was normal. 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 Other PMH includes HTN, OA, asthma.   Results of bloodwork from 05/05/2016 were as follows: CBC showed white count of 7.3, H&H of 10.1/30.4 and a platelet count of 234. CMP was normal except for elevated BUN of 50 and creatinine of 1.97. Ferritin was elevated at 430. Iron studies showed iron saturation of 20%. Coombs test was negative, folate level was normal and B12 level was elevated at 1170. ESR was elevated at 46. Myeloma panel did not reveal any monoclonal protein. epo level was 13.7  Plan was to give him 40000 weekly X 4 doses Day -21, Day-14 Day -7 and on the day of surgery. We will aim to keep his hemoglobin close to 13 on the day of surgery.   Interval history- reports some fatigue. Denies any complaints. He will be seeing Dr. Holley Raring for his abnormal kidney functions and needs renal clearance prior to surgery which is tentatively scheduled for next week  ECOG PS- 0 Pain scale- 0  Review of  systems- Review of Systems  Constitutional: Negative for chills, fever, malaise/fatigue and weight loss.  HENT: Negative for congestion, ear discharge and nosebleeds.   Eyes: Negative for blurred vision.  Respiratory: Negative for cough, hemoptysis, sputum production, shortness of breath and wheezing.   Cardiovascular: Negative for chest pain, palpitations, orthopnea and claudication.  Gastrointestinal: Negative for abdominal pain, blood in stool, constipation, diarrhea, heartburn, melena, nausea and vomiting.  Genitourinary: Negative for dysuria, flank pain, frequency, hematuria and urgency.  Musculoskeletal: Positive for joint pain. Negative for back pain and myalgias.  Skin: Negative for rash.  Neurological: Negative for dizziness, tingling, focal weakness, seizures, weakness and headaches.  Endo/Heme/Allergies: Does not bruise/bleed easily.  Psychiatric/Behavioral: Negative for depression and suicidal ideas. The patient does not have insomnia.       Allergies  Allergen Reactions  . Food     DAIRY=causes migraines.     Past Medical History:  Diagnosis Date  . Allergic rhinitis   . Anemia   . BPH (benign prostatic hyperplasia)   . Chronic sinusitis   . Complication of anesthesia    work up during surgery 2x in the past   . Decreased libido   . ED (erectile dysfunction)   . Fatigue   . Hematuria   . HTN (hypertension)   . Hypogonadism in male   . Hypokalemia    history of   . IBS (irritable bowel syndrome)   . Low serum vitamin D   . Lumbago   . Migraine   .  Mild intermittent asthma   . Reflux   . Shingles   . Unilateral inguinal hernia without obstruction or gangrene      Past Surgical History:  Procedure Laterality Date  . ADRENALECTOMY Left 02/11/2016   [facility]  . COLONOSCOPY  02/2012   normal  . KNEE ARTHROSCOPY Left 10/06/2009  . SINUS EXPLORATION      Social History   Social History  . Marital status: Married    Spouse name: N/A  . Number of children:  N/A  . Years of education: N/A   Occupational History  . Not on file.   Social History Main Topics  . Smoking status: Former Smoker    Packs/day: 1.00    Years: 10.00  . Smokeless tobacco: Never Used     Comment: 38 years ago 11 when he stopped  . Alcohol use No  . Drug use: No  . Sexual activity: Yes    Partners: Female   Other Topics Concern  . Not on file   Social History Narrative  . No narrative on file    Family History  Problem Relation Age of Onset  . Diabetes Mother   . Heart disease Mother   . Lung disease Mother   . Seizures Maternal Grandmother      Current Outpatient Prescriptions:  .  albuterol (PROAIR HFA) 108 (90 Base) MCG/ACT inhaler, Inhale 2 puffs into the lungs every 6 (six) hours as needed for wheezing or shortness of breath., Disp: 1 Inhaler, Rfl: 0 .  AMINO ACIDS PO, Take 1-2 tablets by mouth 2 (two) times daily. Take 2 tablets with lunch & 1 tablet in the evening. Essential Amino Energy, Disp: , Rfl:  .  azelastine (OPTIVAR) 0.05 % ophthalmic solution, Place 2 drops into both eyes 2 (two) times daily. (Patient taking differently: Place 1 drop into both eyes daily. ), Disp: 6 mL, Rfl: 5 .  fexofenadine (ALLEGRA) 180 MG tablet, Take 180 mg by mouth daily as needed for allergies or rhinitis., Disp: , Rfl:  .  fexofenadine-pseudoephedrine (ALLEGRA-D 24) 180-240 MG 24 hr tablet, Take 1 tablet by mouth daily as needed (for allergies.)., Disp: , Rfl:  .  fluticasone (FLONASE) 50 MCG/ACT nasal spray, Place 2 sprays into both nostrils daily. (Patient taking differently: Place 2 sprays into both nostrils daily as needed (for allergies.). ), Disp: 16 g, Rfl: 5 .  fluticasone furoate-vilanterol (BREO ELLIPTA) 200-25 MCG/INH AEPB, Inhale 1 puff into the lungs daily., Disp: 60 each, Rfl: 5 .  Glucosamine-MSM-Hyaluronic Acd (JOINT HEALTH PO), Take 1 tablet by mouth daily. INSTAFLEX ADVANCED JOINT SUPPORT, Disp: , Rfl:  .  Misc Natural Products (NF FORMULAS  TESTOSTERONE PO), Take 2 tablets by mouth daily. NATURE'S PLUS T-MALE SUPPLEMENT, Disp: , Rfl:  .  montelukast (SINGULAIR) 10 MG tablet, Take 1 tablet (10 mg total) by mouth daily., Disp: 30 tablet, Rfl: 5 .  Multiple Vitamins-Minerals (MULTIVITAMIN ADULTS 50+ PO), Take 1 Package by mouth daily. NATURE'S CODE MEN OVER 50 MULTIVITAMIN PACK, Disp: , Rfl:  .  mupirocin ointment (BACTROBAN) 2 %, APPLY IN EACH NOSTRIL TWICE A DAY FOR 5 DAYS, Disp: , Rfl: 0 .  NUTRITIONAL SUPPLEMENT LIQD, Take 1 scoop by mouth daily as needed (for energy support). Biot[employer] 45X Energizing Berry., Disp: , Rfl:  .  Nutritional Supplements (NUTRITIONAL SUPPLEMENT PO), Take 1 Bottle by mouth daily. Next Step Fit N Full Shake, Disp: , Rfl:  .  omeprazole (PRILOSEC) 40 MG capsule, Take 1 capsule (40 mg  total) by mouth every morning., Disp: 90 capsule, Rfl: 0 .  OVER THE COUNTER MEDICATION, Take 1 tablet by mouth daily. TEST-HD TESTOSTERONE SUPPORT, Disp: , Rfl:  .  tadalafil (CIALIS) 5 MG tablet, Take 1 tablet (5 mg total) by mouth daily., Disp: 30 tablet, Rfl: 5 .  tamsulosin (FLOMAX) 0.4 MG CAPS capsule, Take 1 capsule (0.4 mg total) by mouth daily. (Patient taking differently: Take 0.4 mg by mouth every evening. ), Disp: 30 capsule, Rfl: 5 .  traMADol (ULTRAM) 50 MG tablet, Take 100 mg by mouth 4 (four) times daily as needed (for pain). , Disp: , Rfl:  .  valsartan (DIOVAN) 80 MG tablet, Take 1 tablet (80 mg total) by mouth daily., Disp: 30 tablet, Rfl: 6  Physical exam:  Vitals:   06/13/16 1511  BP: 108/73  Pulse: 96  Temp: 98.7 F (37.1 C)  TempSrc: Tympanic  Weight: 266 lb 3 oz (120.7 kg)   Physical Exam  Constitutional: He is oriented to person, place, and time and well-developed, well-nourished, and in no distress.  HENT:  Head: Normocephalic and atraumatic.  Eyes: EOM are normal. Pupils are equal, round, and reactive to light.  Neck: Normal range of motion.  Cardiovascular: Normal rate, regular  rhythm and normal heart sounds.   Pulmonary/Chest: Effort normal and breath sounds normal.  Abdominal: Soft. Bowel sounds are normal.  Neurological: He is alert and oriented to person, place, and time.  Skin: Skin is warm and dry.     CMP Latest Ref Rng & Units 06/09/2016  Glucose 65 - 99 mg/dL 108(H)  BUN 6 - 20 mg/dL 35(H)  Creatinine 0.61 - 1.24 mg/dL 2.17(H)  Sodium 135 - 145 mmol/L 137  Potassium 3.5 - 5.1 mmol/L 5.4(H)  Chloride 101 - 111 mmol/L 103  CO2 22 - 32 mmol/L 24  Calcium 8.9 - 10.3 mg/dL 10.0  Total Protein 6.5 - 8.1 g/dL 7.8  Total Bilirubin 0.3 - 1.2 mg/dL 0.4  Alkaline Phos 38 - 126 U/L 83  AST 15 - 41 U/L 22  ALT 17 - 63 U/L 21   CBC Latest Ref Rng & Units 06/13/2016  WBC 3.8 - 10.6 K/uL 6.2  Hemoglobin 13.0 - 18.0 g/dL 11.5(L)  Hematocrit 40.0 - 52.0 % 34.6(L)  Platelets 150 - 440 K/uL 250    No images are attached to the encounter.  Dg Chest 2 View  Result Date: 06/09/2016 CLINICAL DATA:  Knee replacement.  Preoperative chest x-ray . EXAM: CHEST  2 VIEW FINDINGS: Mediastinum and hilar structures normal. Heart size normal. No focal infiltrate. No pleural effusion or pneumothorax. Degenerative changes thoracic spine with scoliosis . IMPRESSION: No acute cardiopulmonary disease. Electronically Signed   By: Marcello Moores  Register   On: 06/09/2016 16:13   US Renal  Result Date: 06/12/2016 CLINICAL DATA:  Acute renal failure . EXAM: RENAL / URINARY TRACT ULTRASOUND COMPLETE COMPARISON:  CT 10/11/2015. FINDINGS: Right Kidney: Length: 11.7 cm. Echogenicity within normal limits. No mass or hydronephrosis visualized. Left Kidney: Length: 13.2 cm. Echogenicity within normal limits. No mass or hydronephrosis visualized. Bladder: Appears normal for degree of bladder distention. IMPRESSION: Negative exam. No focal abnormality identified. No hydronephrosis or bladder distention. Electronically Signed   By: Marcello Moores  Register   On: 06/12/2016 16:39     Assessment and plan- Patient is  a 66 y.o. male referred for normocytic anemia prior to undergoing knee replacement surgery  On basis of previous labs- patient does have abnormal BUN/creatinine over last couple of months.  Prior values were normal. No other cause of anemia evident. Anemia could be secondary to chronci medical issues such as HTN/arthritis versus kidney disease. He has had some improvement in his H/H after 2 doses of EPO. Goal is to give him 4 weekly doses to try to get his hb close to 13 for surgery. He will get his 3rd dose of 07680 units today. He will be getting his 4th dose next week on the day of surgery but given his kidney issues, it remains to be seen if he can undergo surgery next week. He will continue oral iron while on EPO.  I will see him 1 month post surgery. 4th dose of epo on hold depending on if he goes through surgery next week   Visit Diagnosis 1. Anemia of chronic disease   2. Anemia of chronic kidney failure, unspecified stage      Dr. Randa Evens, MD, MPH [facility] Pager- [phone] 06/15/2016 8:33 AM

## 2016-06-16 DIAGNOSIS — E875 Hyperkalemia: Secondary | ICD-10-CM | POA: Diagnosis not present

## 2016-06-16 DIAGNOSIS — N179 Acute kidney failure, unspecified: Secondary | ICD-10-CM | POA: Diagnosis not present

## 2016-06-16 NOTE — Addendum Note (Signed)
Addended by: Gary Fleet on: 06/16/2016 10:23 AM   Modules accepted: Orders

## 2016-06-16 NOTE — Progress Notes (Signed)
Second request made to [facility] for EKG tracing

## 2016-06-16 NOTE — Addendum Note (Signed)
Addended by: Raelyn Ensign E on: 06/16/2016 11:10 AM   Modules accepted: Level of Service

## 2016-06-17 NOTE — Telephone Encounter (Signed)
Please call patient and ask him when he is having his labs drawn. Ask him to give [place] a call when his labs are back so that we can view them and let Dr. Berenice Primas know whether or not the surgery can proceed.  Thank you!

## 2016-06-17 NOTE — Telephone Encounter (Signed)
Had labs on yesterday. Has not received a call about results. Will call soon as he is informed

## 2016-06-18 ENCOUNTER — Telehealth: Payer: Self-pay | Admitting: *Deleted

## 2016-06-18 DIAGNOSIS — E876 Hypokalemia: Secondary | ICD-10-CM | POA: Diagnosis not present

## 2016-06-18 DIAGNOSIS — I10 Essential (primary) hypertension: Secondary | ICD-10-CM | POA: Diagnosis not present

## 2016-06-18 NOTE — Progress Notes (Signed)
Call placed to [place] records re faxed to [phone] for EKG tracing.

## 2016-06-18 NOTE — Telephone Encounter (Signed)
Called Dr. Holley Raring office and asked about the evaluation of pt and if he was going to proceed with his surgery tom. She put me on hold and asked Lateef and he can still have surgery tom. His creat came back 1.71.

## 2016-06-18 NOTE — Telephone Encounter (Signed)
Spoke with Dr. Berenice Primas and he is updated/in agreement with plan of care.

## 2016-06-18 NOTE — Telephone Encounter (Signed)
Spoke with Dr. Holley Raring, patient is tentatively cleared for surgery on 06/20/2016 - Kidney function has shown improvement since stopping NSAIDS. Pt will return for lab draw again today and Dr. Holley Raring will call tomorrow (06/19/2016) to confirm pt OK for surgery. He does advise Ortho to provide additional hydration during and after the procedure. Attempted to call Dr. Berenice Primas x1. Will try again later today.

## 2016-06-19 MED ORDER — DEXTROSE 5 % IV SOLN
3.0000 g | INTRAVENOUS | Status: AC
  Administered 2016-06-20: 3 g via INTRAVENOUS
  Filled 2016-06-19: qty 3000

## 2016-06-19 NOTE — Telephone Encounter (Signed)
Patient notified

## 2016-06-19 NOTE — H&P (Signed)
TOTAL KNEE ADMISSION H&P  Patient is being admitted for right total knee arthroplasty.  Subjective:  Chief Complaint:right knee pain.  HPI: [name], 66 y.o. male, has a history of pain and functional disability in the right knee due to arthritis and has failed non-surgical conservative treatments for greater than 12 weeks to includeNSAID's and/or analgesics, corticosteriod injections, viscosupplementation injections, flexibility and strengthening excercises and activity modification.  Onset of symptoms was gradual, starting 5 years ago with gradually worsening course since that time. The patient noted no past surgery on the right knee(s).  Patient currently rates pain in the right knee(s) at 8 out of 10 with activity. Patient has night pain, worsening of pain with activity and weight bearing, pain that interferes with activities of daily living, pain with passive range of motion and joint swelling.  Patient has evidence of subchondral cysts, subchondral sclerosis, joint subluxation, joint space narrowing and failure of all reasonable care by imaging studies. This patient has had failure of all reasonable conservative care. There is no active infection.Pt has significant medical co-morbidities and will need to stay 2 midnights.  Patient Active Problem List   Diagnosis Date Noted  . Elevated ferritin 04/30/2016  . Anemia, unspecified 04/30/2016  . History of benign neoplasm of adrenal gland 02/11/2016  . History of iron deficiency anemia 10/10/2015  . BPH (benign prostatic hyperplasia) 06/20/2015  . ED (erectile dysfunction) 06/20/2015  . Allergic rhinitis, seasonal 06/20/2015  . Anemia of chronic disease 06/20/2015  . Hypogonadism in male 06/20/2015  . Asthma, well controlled, moderate persistent 06/20/2015  . Hypertension, benign 06/20/2015  . Hyperglycemia 06/20/2015  . History of shingles 06/20/2015  . GERD without esophagitis 06/20/2015  . Chronic radicular low back pain  06/20/2015  . History of epilepsy 06/20/2015  . Migraine without aura and without status migrainosus, not intractable 06/20/2015  . Dyslipidemia 06/20/2015  . Primary osteoarthritis of both knees 06/20/2015   Past Medical History:  Diagnosis Date  . Allergic rhinitis   . Anemia   . BPH (benign prostatic hyperplasia)   . Chronic sinusitis   . Complication of anesthesia    work up during surgery 2x in the past   . Decreased libido   . ED (erectile dysfunction)   . Fatigue   . Hematuria   . HTN (hypertension)   . Hypogonadism in male   . Hypokalemia    history of   . IBS (irritable bowel syndrome)   . Low serum vitamin D   . Lumbago   . Migraine   . Mild intermittent asthma   . Reflux   . Shingles   . Unilateral inguinal hernia without obstruction or gangrene     Past Surgical History:  Procedure Laterality Date  . ADRENALECTOMY Left 02/11/2016   [facility]  . COLONOSCOPY  02/2012   normal  . KNEE ARTHROSCOPY Left 10/06/2009  . SINUS EXPLORATION      No prescriptions prior to admission.   Allergies  Allergen Reactions  . Lactose Intolerance (Gi) Other (See Comments)    MIGRAINES    Social History  Substance Use Topics  . Smoking status: Former Smoker    Packs/day: 1.00    Years: 10.00  . Smokeless tobacco: Never Used     Comment: 38 years ago 75 when he stopped  . Alcohol use No    Family History  Problem Relation Age of Onset  . Diabetes Mother   . Heart disease Mother   . Lung disease Mother   .  Seizures Maternal Grandmother      ROS ROS: I have reviewed the patient's review of systems thoroughly and there are no positive responses as relates to the HPI. Objective:  Physical Exam  Vital signs in last 24 hours:   Well-developed well-nourished patient in no acute distress. Alert and oriented x3 HEENT:within normal limits Cardiac: Regular rate and rhythm Pulmonary: Lungs clear to auscultation Abdomen: Soft and nontender.  Normal active bowel  sounds  Musculoskeletal: (r knee limited rom painful rom/ trace effusion ) Labs: Recent Results (from the past 2160 hour(s))  Iron and TIBC     Status: None   Collection Time: 04/24/16  4:05 PM  Result Value Ref Range   Total Iron Binding Capacity 256 250 - 450 ug/dL   UIBC 206 111 - 343 ug/dL   Iron 50 38 - 169 ug/dL   Iron Saturation 20 15 - 55 %  Ferritin     Status: Abnormal   Collection Time: 04/24/16  4:05 PM  Result Value Ref Range   Ferritin 631 (H) 30 - 400 ng/mL  Hemoglobin     Status: Abnormal   Collection Time: 04/24/16  4:05 PM  Result Value Ref Range   Hemoglobin 10.1 (L) 13.0 - 17.7 g/dL  Hematocrit     Status: Abnormal   Collection Time: 04/24/16  4:05 PM  Result Value Ref Range   Hematocrit 31.1 (L) 37.5 - 51.0 %  CBC with Differential/Platelet     Status: Abnormal   Collection Time: 05/05/16  2:58 PM  Result Value Ref Range   WBC 7.3 3.8 - 10.6 K/uL   RBC 3.51 (L) 4.40 - 5.90 MIL/uL   Hemoglobin 10.1 (L) 13.0 - 18.0 g/dL   HCT 30.4 (L) 40.0 - 52.0 %   MCV 86.6 80.0 - 100.0 fL   MCH 28.8 26.0 - 34.0 pg   MCHC 33.2 32.0 - 36.0 g/dL   RDW 15.1 (H) 11.5 - 14.5 %   Platelets 234 150 - 440 K/uL   Neutrophils Relative % 50 %   Neutro Abs 3.7 1.4 - 6.5 K/uL   Lymphocytes Relative 35 %   Lymphs Abs 2.5 1.0 - 3.6 K/uL   Monocytes Relative 10 %   Monocytes Absolute 0.7 0.2 - 1.0 K/uL   Eosinophils Relative 4 %   Eosinophils Absolute 0.3 0 - 0.7 K/uL   Basophils Relative 1 %   Basophils Absolute 0.1 0 - 0.1 K/uL  Comprehensive metabolic panel     Status: Abnormal   Collection Time: 05/05/16  2:58 PM  Result Value Ref Range   Sodium 135 135 - 145 mmol/L   Potassium 5.0 3.5 - 5.1 mmol/L   Chloride 103 101 - 111 mmol/L   CO2 24 22 - 32 mmol/L   Glucose, Bld 105 (H) 65 - 99 mg/dL   BUN 50 (H) 6 - 20 mg/dL   Creatinine, Ser 1.97 (H) 0.61 - 1.24 mg/dL   Calcium 9.3 8.9 - 10.3 mg/dL   Total Protein 7.9 6.5 - 8.1 g/dL   Albumin 4.1 3.5 - 5.0 g/dL   AST 26 15 -  41 U/L   ALT 23 17 - 63 U/L   Alkaline Phosphatase 92 38 - 126 U/L   Total Bilirubin 0.4 0.3 - 1.2 mg/dL   GFR calc non Af Amer 34 (L) >60 mL/min   GFR calc Af Amer 39 (L) >60 mL/min    Comment: (NOTE) The eGFR has been calculated using the CKD EPI equation.  This calculation has not been validated in all clinical situations. eGFR's persistently <60 mL/min signify possible Chronic Kidney Disease.    Anion gap 8 5 - 15  Ferritin     Status: Abnormal   Collection Time: 05/05/16  2:58 PM  Result Value Ref Range   Ferritin 430 (H) 24 - 336 ng/mL  Folate     Status: None   Collection Time: 05/05/16  2:58 PM  Result Value Ref Range   Folate 15.3 >5.9 ng/mL  Iron and TIBC     Status: None   Collection Time: 05/05/16  2:58 PM  Result Value Ref Range   Iron 56 45 - 182 ug/dL   TIBC 281 250 - 450 ug/dL   Saturation Ratios 20 17.9 - 39.5 %   UIBC 225 ug/dL  Vitamin B12     Status: Abnormal   Collection Time: 05/05/16  2:58 PM  Result Value Ref Range   Vitamin B-12 1,170 (H) 180 - 914 pg/mL    Comment: (NOTE) This assay is not validated for testing neonatal or myeloproliferative syndrome specimens for Vitamin B12 levels. Performed at [facility], [place] [address] [place], [place] [address]   Reticulocytes     Status: Abnormal   Collection Time: 05/05/16  2:58 PM  Result Value Ref Range   Retic Ct Pct 1.2 0.4 - 3.1 %   RBC. 3.54 (L) 4.40 - 5.90 MIL/uL   Retic Count, Manual 42.5 19.0 - 183.0 K/uL  Haptoglobin     Status: Abnormal   Collection Time: 05/05/16  2:58 PM  Result Value Ref Range   Haptoglobin 208 (H) 34 - 200 mg/dL    Comment: (NOTE) Performed At: [facility] [place], [place] [id number] Lindon Romp MD [id number]   Sedimentation rate     Status: Abnormal   Collection Time: 05/05/16  2:58 PM  Result Value Ref Range   Sed Rate 46 (H) 0 - 20 mm/hr  Erythropoietin     Status: None   Collection Time: 05/05/16  2:58 PM  Result  Value Ref Range   Erythropoietin 13.7 2.6 - 18.5 mIU/mL    Comment: (NOTE) [employer] UniCel DxI 800 Immunoassay System Performed At: [facility] [place], [place] [id number] Lindon Romp MD [id number]   Multiple Myeloma Panel (SPEP&IFE w/QIG)     Status: None   Collection Time: 05/05/16  2:58 PM  Result Value Ref Range   IgG (Immunoglobin G), Serum 1,361 700 - 1,600 mg/dL   IgA 213 61 - 437 mg/dL   IgM, Serum 49 20 - 172 mg/dL   Total Protein ELP 7.1 6.0 - 8.5 g/dL   Albumin SerPl Elph-Mcnc 3.6 2.9 - 4.4 g/dL   Alpha 1 0.2 0.0 - 0.4 g/dL   Alpha2 Glob SerPl Elph-Mcnc 0.7 0.4 - 1.0 g/dL   B-Globulin SerPl Elph-Mcnc 1.1 0.7 - 1.3 g/dL   Gamma Glob SerPl Elph-Mcnc 1.4 0.4 - 1.8 g/dL   M Protein SerPl Elph-Mcnc Not Observed Not Observed g/dL   Globulin, Total 3.5 2.2 - 3.9 g/dL   Albumin/Glob SerPl 1.1 0.7 - 1.7   IFE 1 Comment     Comment: An apparent normal immunofixation pattern.   Please Note Comment     Comment: (NOTE) Protein electrophoresis scan will follow via computer, mail, or courier delivery. Performed At: [facility] [place], [place] [id number] Lindon Romp MD [id number]   DAT, polyspecific, [facility])  Status: None   Collection Time: 05/05/16  2:58 PM  Result Value Ref Range   Polyspecific AHG test NEG   Hemoglobin     Status: Abnormal   Collection Time: 05/30/16  3:20 PM  Result Value Ref Range   Hemoglobin 9.5 (L) 13.0 - 18.0 g/dL  Hemoglobin [facility])     Status: Abnormal   Collection Time: 06/06/16  3:12 PM  Result Value Ref Range   Hemoglobin 10.7 (L) 13.0 - 18.0 g/dL  Surgical pcr screen     Status: Abnormal   Collection Time: 06/09/16  2:31 PM  Result Value Ref Range   MRSA, PCR NEGATIVE NEGATIVE   Staphylococcus aureus POSITIVE (A) NEGATIVE    Comment:        The Xpert SA Assay (FDA approved for NASAL specimens in patients over 27 years of age), is one component of a  comprehensive surveillance program.  Test performance has been validated by [facility] for patients greater than or equal to 4 year old. It is not intended to diagnose infection nor to guide or monitor treatment.   Urinalysis, Routine w reflex microscopic     Status: Abnormal   Collection Time: 06/09/16  2:31 PM  Result Value Ref Range   Color, Urine YELLOW YELLOW   APPearance HAZY (A) CLEAR   Specific Gravity, Urine 1.023 1.005 - 1.030   pH 5.0 5.0 - 8.0   Glucose, UA NEGATIVE NEGATIVE mg/dL   Hgb urine dipstick NEGATIVE NEGATIVE   Bilirubin Urine NEGATIVE NEGATIVE   Ketones, ur NEGATIVE NEGATIVE mg/dL   Protein, ur NEGATIVE NEGATIVE mg/dL   Nitrite NEGATIVE NEGATIVE   Leukocytes, UA NEGATIVE NEGATIVE  APTT     Status: None   Collection Time: 06/09/16  2:32 PM  Result Value Ref Range   aPTT 31 24 - 36 seconds  CBC WITH DIFFERENTIAL     Status: Abnormal   Collection Time: 06/09/16  2:32 PM  Result Value Ref Range   WBC 6.7 4.0 - 10.5 K/uL   RBC 3.89 (L) 4.22 - 5.81 MIL/uL   Hemoglobin 11.2 (L) 13.0 - 17.0 g/dL   HCT 36.0 (L) 39.0 - 52.0 %   MCV 92.5 78.0 - 100.0 fL   MCH 28.8 26.0 - 34.0 pg   MCHC 31.1 30.0 - 36.0 g/dL   RDW 15.3 11.5 - 15.5 %   Platelets 263 150 - 400 K/uL   Neutrophils Relative % 59 %   Neutro Abs 3.9 1.7 - 7.7 K/uL   Lymphocytes Relative 32 %   Lymphs Abs 2.1 0.7 - 4.0 K/uL   Monocytes Relative 6 %   Monocytes Absolute 0.4 0.1 - 1.0 K/uL   Eosinophils Relative 3 %   Eosinophils Absolute 0.2 0.0 - 0.7 K/uL   Basophils Relative 0 %   Basophils Absolute 0.0 0.0 - 0.1 K/uL  Comprehensive metabolic panel     Status: Abnormal   Collection Time: 06/09/16  2:32 PM  Result Value Ref Range   Sodium 137 135 - 145 mmol/L   Potassium 5.4 (H) 3.5 - 5.1 mmol/L   Chloride 103 101 - 111 mmol/L   CO2 24 22 - 32 mmol/L   Glucose, Bld 108 (H) 65 - 99 mg/dL   BUN 35 (H) 6 - 20 mg/dL   Creatinine, Ser 2.17 (H) 0.61 - 1.24 mg/dL   Calcium 10.0 8.9 - 10.3 mg/dL    Total Protein 7.8 6.5 - 8.1 g/dL   Albumin 4.2 3.5 - 5.0 g/dL  AST 22 15 - 41 U/L   ALT 21 17 - 63 U/L   Alkaline Phosphatase 83 38 - 126 U/L   Total Bilirubin 0.4 0.3 - 1.2 mg/dL   GFR calc non Af Amer 30 (L) >60 mL/min   GFR calc Af Amer 35 (L) >60 mL/min    Comment: (NOTE) The eGFR has been calculated using the CKD EPI equation. This calculation has not been validated in all clinical situations. eGFR's persistently <60 mL/min signify possible Chronic Kidney Disease.    Anion gap 10 5 - 15  Protime-INR     Status: None   Collection Time: 06/09/16  2:32 PM  Result Value Ref Range   Prothrombin Time 14.1 11.4 - 15.2 seconds   INR 1.09   Type and screen Order type and screen if day of surgery is less than 15 days from draw of preadmission visit or order morning of surgery if day of surgery is greater than 6 days from preadmission visit.     Status: None   Collection Time: 06/09/16  2:45 PM  Result Value Ref Range   ABO/RH(D) O POS    Antibody Screen NEG    Sample Expiration 06/23/2016    Extend sample reason NO TRANSFUSIONS OR PREGNANCY IN THE PAST 3 MONTHS   ABO/Rh     Status: None   Collection Time: 06/09/16  2:45 PM  Result Value Ref Range   ABO/RH(D) O POS   CBC     Status: Abnormal   Collection Time: 06/13/16  2:30 PM  Result Value Ref Range   WBC 6.2 3.8 - 10.6 K/uL   RBC 3.82 (L) 4.40 - 5.90 MIL/uL   Hemoglobin 11.5 (L) 13.0 - 18.0 g/dL   HCT 34.6 (L) 40.0 - 52.0 %   MCV 90.6 80.0 - 100.0 fL   MCH 30.0 26.0 - 34.0 pg   MCHC 33.1 32.0 - 36.0 g/dL   RDW 15.6 (H) 11.5 - 14.5 %   Platelets 250 150 - 440 K/uL    Estimated body mass index is 34.18 kg/m as calculated from the following:   Height as of 06/11/16: _0  (1.88 m).   Weight as of 06/13/16: 120.7 kg (266 lb 3 oz).   Imaging Review Plain radiographs demonstrate moderate degenerative joint disease of the right knee(s). The overall alignment ismild varus. The bone quality appears to be fair for age and  reported activity level.  Assessment/Plan:  End stage arthritis, right knee   The patient history, physical examination, clinical judgment of the provider and imaging studies are consistent with end stage degenerative joint disease of the right knee(s) and total knee arthroplasty is deemed medically necessary. The treatment options including medical management, injection therapy arthroscopy and arthroplasty were discussed at length. The risks and benefits of total knee arthroplasty were presented and reviewed. The risks due to aseptic loosening, infection, stiffness, patella tracking problems, thromboembolic complications and other imponderables were discussed. The patient acknowledged the explanation, agreed to proceed with the plan and consent was signed. Patient is being admitted for inpatient treatment for surgery, pain control, PT, OT, prophylactic antibiotics, VTE prophylaxis, progressive ambulation and ADL's and discharge planning. The patient is planning to be discharged home with home health services

## 2016-06-19 NOTE — Telephone Encounter (Signed)
Dr. Holley Raring confirms pt is cleared by him to have surgery. Dr. Joycie Peek office confirms patient does not need steroidal support. Dr. Berenice Primas is aware patient is cleared for surgery. Please call and inform patient that he is completely cleared for surgery tomorrow. Thank you!

## 2016-06-20 ENCOUNTER — Inpatient Hospital Stay (HOSPITAL_COMMUNITY): Payer: Medicare Other | Admitting: Certified Registered Nurse Anesthetist

## 2016-06-20 ENCOUNTER — Inpatient Hospital Stay (HOSPITAL_COMMUNITY)
Admission: RE | Admit: 2016-06-20 | Discharge: 2016-06-22 | DRG: 470 | Disposition: A | Discharge status: Home / Self Care | Payer: Medicare Other | Source: Ambulatory Visit | Attending: Orthopedic Surgery | Admitting: Orthopedic Surgery

## 2016-06-20 ENCOUNTER — Inpatient Hospital Stay (HOSPITAL_COMMUNITY): Payer: Medicare Other | Admitting: Emergency Medicine

## 2016-06-20 ENCOUNTER — Telehealth: Payer: Self-pay | Admitting: *Deleted

## 2016-06-20 ENCOUNTER — Encounter (HOSPITAL_COMMUNITY)
Admission: RE | Disposition: A | Discharge status: Home / Self Care | Payer: Self-pay | Source: Ambulatory Visit | Attending: Orthopedic Surgery

## 2016-06-20 ENCOUNTER — Encounter (HOSPITAL_COMMUNITY): Payer: Self-pay | Admitting: *Deleted

## 2016-06-20 DIAGNOSIS — N529 Male erectile dysfunction, unspecified: Secondary | ICD-10-CM | POA: Diagnosis present

## 2016-06-20 DIAGNOSIS — N400 Enlarged prostate without lower urinary tract symptoms: Secondary | ICD-10-CM | POA: Diagnosis present

## 2016-06-20 DIAGNOSIS — M1711 Unilateral primary osteoarthritis, right knee: Secondary | ICD-10-CM | POA: Diagnosis present

## 2016-06-20 DIAGNOSIS — E785 Hyperlipidemia, unspecified: Secondary | ICD-10-CM | POA: Diagnosis not present

## 2016-06-20 DIAGNOSIS — N179 Acute kidney failure, unspecified: Secondary | ICD-10-CM | POA: Diagnosis not present

## 2016-06-20 DIAGNOSIS — E291 Testicular hypofunction: Secondary | ICD-10-CM | POA: Diagnosis present

## 2016-06-20 DIAGNOSIS — Z79899 Other long term (current) drug therapy: Secondary | ICD-10-CM

## 2016-06-20 DIAGNOSIS — K589 Irritable bowel syndrome without diarrhea: Secondary | ICD-10-CM | POA: Diagnosis present

## 2016-06-20 DIAGNOSIS — D649 Anemia, unspecified: Secondary | ICD-10-CM | POA: Diagnosis not present

## 2016-06-20 DIAGNOSIS — G8918 Other acute postprocedural pain: Secondary | ICD-10-CM | POA: Diagnosis not present

## 2016-06-20 DIAGNOSIS — D638 Anemia in other chronic diseases classified elsewhere: Secondary | ICD-10-CM

## 2016-06-20 DIAGNOSIS — E876 Hypokalemia: Secondary | ICD-10-CM | POA: Diagnosis not present

## 2016-06-20 HISTORY — PX: TOTAL KNEE ARTHROPLASTY: SHX125

## 2016-06-20 LAB — POCT I-STAT 4, (NA,K, GLUC, HGB,HCT)
GLUCOSE: 109 mg/dL — AB (ref 65–99)
HCT: 38 % — ABNORMAL LOW (ref 39.0–52.0)
HEMOGLOBIN: 12.9 g/dL — AB (ref 13.0–17.0)
POTASSIUM: 5.1 mmol/L (ref 3.5–5.1)
Sodium: 138 mmol/L (ref 135–145)

## 2016-06-20 SURGERY — ARTHROPLASTY, KNEE, TOTAL
Anesthesia: Regional | Site: Knee | Laterality: Right

## 2016-06-20 MED ORDER — FLUTICASONE FUROATE-VILANTEROL 200-25 MCG/INH IN AEPB
1.0000 | INHALATION_SPRAY | Freq: Every day | RESPIRATORY_TRACT | Status: DC
  Administered 2016-06-21 – 2016-06-22 (×2): 1 via RESPIRATORY_TRACT
  Filled 2016-06-20: qty 28

## 2016-06-20 MED ORDER — ACETAMINOPHEN 325 MG PO TABS
650.0000 mg | ORAL_TABLET | Freq: Four times a day (QID) | ORAL | Status: DC | PRN
  Administered 2016-06-20 – 2016-06-22 (×5): 650 mg via ORAL
  Filled 2016-06-20 (×5): qty 2

## 2016-06-20 MED ORDER — ENSURE ENLIVE PO LIQD
237.0000 mL | Freq: Two times a day (BID) | ORAL | Status: DC
  Administered 2016-06-21 – 2016-06-22 (×3): 237 mL via ORAL

## 2016-06-20 MED ORDER — MONTELUKAST SODIUM 10 MG PO TABS
10.0000 mg | ORAL_TABLET | Freq: Every day | ORAL | Status: DC
  Administered 2016-06-21 – 2016-06-22 (×2): 10 mg via ORAL
  Filled 2016-06-20 (×2): qty 1

## 2016-06-20 MED ORDER — OXYCODONE HCL 5 MG PO TABS
5.0000 mg | ORAL_TABLET | ORAL | Status: DC | PRN
  Administered 2016-06-20 – 2016-06-22 (×12): 10 mg via ORAL
  Filled 2016-06-20 (×11): qty 2

## 2016-06-20 MED ORDER — 0.9 % SODIUM CHLORIDE (POUR BTL) OPTIME
TOPICAL | Status: DC | PRN
  Administered 2016-06-20: 1000 mL

## 2016-06-20 MED ORDER — FENTANYL CITRATE (PF) 250 MCG/5ML IJ SOLN
INTRAMUSCULAR | Status: AC
  Filled 2016-06-20: qty 5

## 2016-06-20 MED ORDER — PROPOFOL 500 MG/50ML IV EMUL
INTRAVENOUS | Status: DC | PRN
  Administered 2016-06-20: 25 ug/kg/min via INTRAVENOUS

## 2016-06-20 MED ORDER — MAGNESIUM CITRATE PO SOLN: 1.0000 | Freq: Once | ORAL | Status: DC | PRN

## 2016-06-20 MED ORDER — ROPIVACAINE HCL 5 MG/ML IJ SOLN
INTRAMUSCULAR | Status: DC | PRN
  Administered 2016-06-20: 30 mL via PERINEURAL

## 2016-06-20 MED ORDER — PROPOFOL 1000 MG/100ML IV EMUL
INTRAVENOUS | Status: AC
  Filled 2016-06-20: qty 100

## 2016-06-20 MED ORDER — MIDAZOLAM HCL 2 MG/2ML IJ SOLN
INTRAMUSCULAR | Status: AC
  Filled 2016-06-20: qty 2

## 2016-06-20 MED ORDER — IRBESARTAN 75 MG PO TABS
75.0000 mg | ORAL_TABLET | Freq: Every day | ORAL | Status: DC
  Administered 2016-06-20 – 2016-06-22 (×3): 75 mg via ORAL
  Filled 2016-06-20 (×3): qty 1

## 2016-06-20 MED ORDER — OXYMETAZOLINE HCL 0.05 % NA SOLN
NASAL | Status: DC | PRN
  Administered 2016-06-20: 3 via NASAL

## 2016-06-20 MED ORDER — ONDANSETRON HCL 4 MG PO TABS: 4.0000 mg | ORAL_TABLET | Freq: Four times a day (QID) | ORAL | Status: DC | PRN

## 2016-06-20 MED ORDER — BUPIVACAINE LIPOSOME 1.3 % IJ SUSP
20.0000 mL | INTRAMUSCULAR | Status: DC
  Filled 2016-06-20: qty 20

## 2016-06-20 MED ORDER — SODIUM CHLORIDE 0.9 % IV SOLN
INTRAVENOUS | Status: DC
  Administered 2016-06-20 (×3): via INTRAVENOUS

## 2016-06-20 MED ORDER — PHENYLEPHRINE HCL 10 MG/ML IJ SOLN
INTRAVENOUS | Status: DC | PRN
  Administered 2016-06-20: 25 ug/min via INTRAVENOUS

## 2016-06-20 MED ORDER — BUPIVACAINE IN DEXTROSE 0.75-8.25 % IT SOLN
INTRATHECAL | Status: DC | PRN
  Administered 2016-06-20: 1.6 mL via INTRATHECAL

## 2016-06-20 MED ORDER — TIZANIDINE HCL 2 MG PO TABS: 2.0000 mg | ORAL_TABLET | Freq: Three times a day (TID) | ORAL | 0 refills | Status: DC | PRN

## 2016-06-20 MED ORDER — ONDANSETRON HCL 4 MG/2ML IJ SOLN
INTRAMUSCULAR | Status: DC | PRN
  Administered 2016-06-20: 4 mg via INTRAVENOUS

## 2016-06-20 MED ORDER — GABAPENTIN 300 MG PO CAPS
300.0000 mg | ORAL_CAPSULE | Freq: Two times a day (BID) | ORAL | Status: DC
  Administered 2016-06-20 – 2016-06-22 (×4): 300 mg via ORAL
  Filled 2016-06-20 (×4): qty 1

## 2016-06-20 MED ORDER — DOCUSATE SODIUM 100 MG PO CAPS: 100.0000 mg | ORAL_CAPSULE | Freq: Two times a day (BID) | ORAL | 0 refills | Status: DC

## 2016-06-20 MED ORDER — TRANEXAMIC ACID 1000 MG/10ML IV SOLN
2000.0000 mg | INTRAVENOUS | Status: AC
  Administered 2016-06-20: 2000 mg via TOPICAL
  Filled 2016-06-20: qty 20

## 2016-06-20 MED ORDER — ONDANSETRON HCL 4 MG/2ML IJ SOLN
INTRAMUSCULAR | Status: AC
  Filled 2016-06-20: qty 2

## 2016-06-20 MED ORDER — FENTANYL CITRATE (PF) 100 MCG/2ML IJ SOLN
INTRAMUSCULAR | Status: AC
  Administered 2016-06-20: 100 ug via INTRAVENOUS
  Filled 2016-06-20: qty 2

## 2016-06-20 MED ORDER — DIPHENHYDRAMINE HCL 12.5 MG/5ML PO ELIX: 12.5000 mg | ORAL_SOLUTION | ORAL | Status: DC | PRN

## 2016-06-20 MED ORDER — BUPIVACAINE LIPOSOME 1.3 % IJ SUSP
INTRAMUSCULAR | Status: DC | PRN
Start: 2016-06-20 — End: 2016-06-20
  Administered 2016-06-20: 20 mL

## 2016-06-20 MED ORDER — ONDANSETRON HCL 4 MG/2ML IJ SOLN: 4.0000 mg | Freq: Four times a day (QID) | INTRAMUSCULAR | Status: DC | PRN

## 2016-06-20 MED ORDER — POLYETHYLENE GLYCOL 3350 17 G PO PACK: 17.0000 g | PACK | Freq: Every day | ORAL | Status: DC | PRN

## 2016-06-20 MED ORDER — TRANEXAMIC ACID 1000 MG/10ML IV SOLN
1000.0000 mg | INTRAVENOUS | Status: AC
  Administered 2016-06-20: 1000 mg via INTRAVENOUS
  Filled 2016-06-20: qty 10

## 2016-06-20 MED ORDER — HYDROMORPHONE HCL 1 MG/ML IJ SOLN
0.5000 mg | INTRAMUSCULAR | Status: DC | PRN
  Administered 2016-06-20: 0.5 mg via INTRAVENOUS

## 2016-06-20 MED ORDER — TRANEXAMIC ACID 1000 MG/10ML IV SOLN
1000.0000 mg | Freq: Once | INTRAVENOUS | Status: AC
  Administered 2016-06-20: 1000 mg via INTRAVENOUS
  Filled 2016-06-20: qty 10

## 2016-06-20 MED ORDER — OXYMETAZOLINE HCL 0.05 % NA SOLN
NASAL | Status: AC
  Filled 2016-06-20: qty 15

## 2016-06-20 MED ORDER — SODIUM CHLORIDE 0.9 % IV SOLN
INTRAVENOUS | Status: DC
  Administered 2016-06-20 (×2): via INTRAVENOUS

## 2016-06-20 MED ORDER — LIDOCAINE 2% (20 MG/ML) 5 ML SYRINGE
INTRAMUSCULAR | Status: AC
  Filled 2016-06-20: qty 5

## 2016-06-20 MED ORDER — SODIUM CHLORIDE 0.9 % IJ SOLN
INTRAMUSCULAR | Status: DC | PRN
  Administered 2016-06-20: 20 mL via INTRAVENOUS

## 2016-06-20 MED ORDER — HYDROMORPHONE HCL 1 MG/ML IJ SOLN
1.0000 mg | INTRAMUSCULAR | Status: DC | PRN
  Administered 2016-06-20: 1 mg via INTRAVENOUS
  Administered 2016-06-20: 2 mg via INTRAVENOUS
  Administered 2016-06-20: 1 mg via INTRAVENOUS
  Administered 2016-06-21 – 2016-06-22 (×10): 2 mg via INTRAVENOUS
  Filled 2016-06-20 (×2): qty 2
  Filled 2016-06-20: qty 1
  Filled 2016-06-20 (×2): qty 2
  Filled 2016-06-20: qty 1
  Filled 2016-06-20 (×6): qty 2
  Filled 2016-06-20 (×2): qty 1

## 2016-06-20 MED ORDER — DOCUSATE SODIUM 100 MG PO CAPS
100.0000 mg | ORAL_CAPSULE | Freq: Two times a day (BID) | ORAL | Status: DC
  Administered 2016-06-20 – 2016-06-22 (×4): 100 mg via ORAL
  Filled 2016-06-20 (×4): qty 1

## 2016-06-20 MED ORDER — PROMETHAZINE HCL 25 MG/ML IJ SOLN: 12.5000 mg | Freq: Four times a day (QID) | INTRAMUSCULAR | Status: DC | PRN

## 2016-06-20 MED ORDER — FENTANYL CITRATE (PF) 250 MCG/5ML IJ SOLN
INTRAMUSCULAR | Status: DC | PRN
  Administered 2016-06-20 (×4): 50 ug via INTRAVENOUS

## 2016-06-20 MED ORDER — ALUM & MAG HYDROXIDE-SIMETH 200-200-20 MG/5ML PO SUSP: 30.0000 mL | ORAL | Status: DC | PRN

## 2016-06-20 MED ORDER — BUPIVACAINE HCL (PF) 0.5 % IJ SOLN
INTRAMUSCULAR | Status: AC
  Filled 2016-06-20: qty 30

## 2016-06-20 MED ORDER — OXYCODONE-ACETAMINOPHEN 5-325 MG PO TABS: 1.0000 | ORAL_TABLET | Freq: Four times a day (QID) | ORAL | 0 refills | Status: DC | PRN

## 2016-06-20 MED ORDER — CEFAZOLIN SODIUM-DEXTROSE 2-4 GM/100ML-% IV SOLN
2.0000 g | Freq: Four times a day (QID) | INTRAVENOUS | Status: AC
Start: 2016-06-20 — End: 2016-06-20
  Administered 2016-06-20 (×2): 2 g via INTRAVENOUS
  Filled 2016-06-20 (×3): qty 100

## 2016-06-20 MED ORDER — HYDROMORPHONE HCL 1 MG/ML IJ SOLN
1.0000 mg | Freq: Once | INTRAMUSCULAR | Status: AC
  Administered 2016-06-20: 1 mg via INTRAVENOUS
  Filled 2016-06-20: qty 1

## 2016-06-20 MED ORDER — GLYCOPYRROLATE 0.2 MG/ML IJ SOLN
INTRAMUSCULAR | Status: DC | PRN
  Administered 2016-06-20: 0.2 mg via INTRAVENOUS

## 2016-06-20 MED ORDER — METHOCARBAMOL 1000 MG/10ML IJ SOLN
500.0000 mg | Freq: Four times a day (QID) | INTRAMUSCULAR | Status: DC | PRN
  Filled 2016-06-20: qty 5

## 2016-06-20 MED ORDER — HYDROMORPHONE HCL 1 MG/ML IJ SOLN
INTRAMUSCULAR | Status: AC
  Filled 2016-06-20: qty 0.5

## 2016-06-20 MED ORDER — ONDANSETRON HCL 4 MG/2ML IJ SOLN: 4.0000 mg | Freq: Once | INTRAMUSCULAR | Status: DC | PRN

## 2016-06-20 MED ORDER — MIDAZOLAM HCL 2 MG/2ML IJ SOLN
INTRAMUSCULAR | Status: AC
Start: 2016-06-20 — End: 2016-06-20
  Filled 2016-06-20: qty 2

## 2016-06-20 MED ORDER — PANTOPRAZOLE SODIUM 40 MG PO TBEC: 80.0000 mg | DELAYED_RELEASE_TABLET | Freq: Every day | ORAL | Status: DC

## 2016-06-20 MED ORDER — KETOTIFEN FUMARATE 0.025 % OP SOLN
2.0000 [drp] | Freq: Two times a day (BID) | OPHTHALMIC | Status: DC
  Administered 2016-06-20 – 2016-06-22 (×3): 2 [drp] via OPHTHALMIC
  Filled 2016-06-20: qty 5

## 2016-06-20 MED ORDER — BUPIVACAINE HCL (PF) 0.5 % IJ SOLN
INTRAMUSCULAR | Status: DC | PRN
  Administered 2016-06-20: 30 mL

## 2016-06-20 MED ORDER — METHOCARBAMOL 500 MG PO TABS
ORAL_TABLET | ORAL | Status: AC
  Filled 2016-06-20: qty 1

## 2016-06-20 MED ORDER — OXYCODONE HCL 5 MG PO TABS
ORAL_TABLET | ORAL | Status: AC
  Filled 2016-06-20: qty 2

## 2016-06-20 MED ORDER — ASPIRIN EC 325 MG PO TBEC
325.0000 mg | DELAYED_RELEASE_TABLET | Freq: Two times a day (BID) | ORAL | Status: DC
  Administered 2016-06-20 – 2016-06-22 (×5): 325 mg via ORAL
  Filled 2016-06-20 (×5): qty 1

## 2016-06-20 MED ORDER — METHOCARBAMOL 500 MG PO TABS
500.0000 mg | ORAL_TABLET | Freq: Four times a day (QID) | ORAL | Status: DC | PRN
  Administered 2016-06-20 – 2016-06-22 (×8): 500 mg via ORAL
  Filled 2016-06-20 (×7): qty 1

## 2016-06-20 MED ORDER — KETAMINE HCL-SODIUM CHLORIDE 100-0.9 MG/10ML-% IV SOSY
PREFILLED_SYRINGE | INTRAVENOUS | Status: AC
  Filled 2016-06-20: qty 10

## 2016-06-20 MED ORDER — EPOETIN ALFA 40000 UNIT/ML IJ SOLN
40000.0000 [IU] | Freq: Once | INTRAMUSCULAR | Status: AC
  Administered 2016-06-20: 40000 [IU] via SUBCUTANEOUS
  Filled 2016-06-20 (×2): qty 1

## 2016-06-20 MED ORDER — KETAMINE HCL 10 MG/ML IJ SOLN
INTRAMUSCULAR | Status: DC | PRN
  Administered 2016-06-20 (×2): 20 mg via INTRAVENOUS

## 2016-06-20 MED ORDER — FENTANYL CITRATE (PF) 100 MCG/2ML IJ SOLN
50.0000 ug | INTRAMUSCULAR | Status: DC | PRN
  Administered 2016-06-20: 100 ug via INTRAVENOUS

## 2016-06-20 MED ORDER — ACETAMINOPHEN 650 MG RE SUPP: 650.0000 mg | Freq: Four times a day (QID) | RECTAL | Status: DC | PRN

## 2016-06-20 MED ORDER — MIDAZOLAM HCL 2 MG/2ML IJ SOLN
INTRAMUSCULAR | Status: DC | PRN
  Administered 2016-06-20 (×2): 1 mg via INTRAVENOUS

## 2016-06-20 MED ORDER — ALBUTEROL SULFATE (2.5 MG/3ML) 0.083% IN NEBU: 2.5000 mg | INHALATION_SOLUTION | Freq: Four times a day (QID) | RESPIRATORY_TRACT | Status: DC | PRN

## 2016-06-20 MED ORDER — FENTANYL CITRATE (PF) 100 MCG/2ML IJ SOLN
INTRAMUSCULAR | Status: AC
  Administered 2016-06-20: 50 ug via INTRAVENOUS
  Filled 2016-06-20: qty 2

## 2016-06-20 MED ORDER — TAMSULOSIN HCL 0.4 MG PO CAPS
0.4000 mg | ORAL_CAPSULE | Freq: Every evening | ORAL | Status: DC
  Administered 2016-06-20 – 2016-06-22 (×3): 0.4 mg via ORAL
  Filled 2016-06-20 (×3): qty 1

## 2016-06-20 MED ORDER — DEXAMETHASONE SODIUM PHOSPHATE 10 MG/ML IJ SOLN
10.0000 mg | Freq: Two times a day (BID) | INTRAMUSCULAR | Status: AC
  Administered 2016-06-20 – 2016-06-21 (×3): 10 mg via INTRAVENOUS
  Filled 2016-06-20 (×3): qty 1

## 2016-06-20 MED ORDER — BISACODYL 5 MG PO TBEC: 5.0000 mg | DELAYED_RELEASE_TABLET | Freq: Every day | ORAL | Status: DC | PRN

## 2016-06-20 MED ORDER — ASPIRIN EC 325 MG PO TBEC: 325.0000 mg | DELAYED_RELEASE_TABLET | Freq: Two times a day (BID) | ORAL | 0 refills | Status: DC

## 2016-06-20 MED ORDER — PROPOFOL 10 MG/ML IV BOLUS
INTRAVENOUS | Status: AC
  Filled 2016-06-20: qty 20

## 2016-06-20 MED ORDER — LIDOCAINE HCL (CARDIAC) 20 MG/ML IV SOLN
INTRAVENOUS | Status: DC | PRN
  Administered 2016-06-20: 80 mg via INTRATRACHEAL

## 2016-06-20 MED ORDER — CHLORHEXIDINE GLUCONATE 4 % EX LIQD: 60.0000 mL | Freq: Once | CUTANEOUS | Status: DC

## 2016-06-20 MED ORDER — FENTANYL CITRATE (PF) 100 MCG/2ML IJ SOLN
25.0000 ug | INTRAMUSCULAR | Status: DC | PRN
  Administered 2016-06-20 (×3): 50 ug via INTRAVENOUS

## 2016-06-20 MED ORDER — PROPOFOL 10 MG/ML IV BOLUS
INTRAVENOUS | Status: DC | PRN
  Administered 2016-06-20 (×2): 30 mg via INTRAVENOUS
  Administered 2016-06-20: 20 mg via INTRAVENOUS

## 2016-06-20 SURGICAL SUPPLY — 57 items
BANDAGE ESMARK 6X9 LF (GAUZE/BANDAGES/DRESSINGS) ×1 IMPLANT
BENZOIN TINCTURE PRP APPL 2/3 (GAUZE/BANDAGES/DRESSINGS) ×3 IMPLANT
BLADE SAGITTAL 25.0X1.19X90 (BLADE) ×2 IMPLANT
BLADE SAGITTAL 25.0X1.19X90MM (BLADE) ×1
BLADE SAW SAG 90X13X1.27 (BLADE) ×3 IMPLANT
BNDG ESMARK 6X9 LF (GAUZE/BANDAGES/DRESSINGS) ×3
BOWL SMART MIX CTS (DISPOSABLE) ×3 IMPLANT
CAPT KNEE TOTAL 3 ATTUNE ×3 IMPLANT
CEMENT HV SMART SET (Cement) ×6 IMPLANT
CLOSURE WOUND 1/2 X4 (GAUZE/BANDAGES/DRESSINGS) ×2
COVER SURGICAL LIGHT HANDLE (MISCELLANEOUS) ×3 IMPLANT
CUFF TOURNIQUET SINGLE 34IN LL (TOURNIQUET CUFF) ×3 IMPLANT
CUFF TOURNIQUET SINGLE 44IN (TOURNIQUET CUFF) IMPLANT
DRAPE EXTREMITY T 121X128X90 (DRAPE) ×3 IMPLANT
DRAPE U-SHAPE 47X51 STRL (DRAPES) ×3 IMPLANT
DRSG AQUACEL AG ADV 3.5X10 (GAUZE/BANDAGES/DRESSINGS) ×3 IMPLANT
DRSG PAD ABDOMINAL 8X10 ST (GAUZE/BANDAGES/DRESSINGS) ×3 IMPLANT
DURAPREP 26ML APPLICATOR (WOUND CARE) ×6 IMPLANT
ELECT REM PT RETURN 9FT ADLT (ELECTROSURGICAL) ×3
ELECTRODE REM PT RTRN 9FT ADLT (ELECTROSURGICAL) ×1 IMPLANT
EVACUATOR 1/8 PVC DRAIN (DRAIN) IMPLANT
FACESHIELD WRAPAROUND (MASK) IMPLANT
GAUZE SPONGE 4X4 12PLY STRL (GAUZE/BANDAGES/DRESSINGS) ×3 IMPLANT
GLOVE BIOGEL PI IND STRL 8 (GLOVE) ×2 IMPLANT
GLOVE BIOGEL PI INDICATOR 8 (GLOVE) ×4
GLOVE ECLIPSE 7.5 STRL STRAW (GLOVE) ×6 IMPLANT
GOWN STRL REUS W/ TWL LRG LVL3 (GOWN DISPOSABLE) ×2 IMPLANT
GOWN STRL REUS W/ TWL XL LVL3 (GOWN DISPOSABLE) ×2 IMPLANT
GOWN STRL REUS W/TWL LRG LVL3 (GOWN DISPOSABLE) ×4
GOWN STRL REUS W/TWL XL LVL3 (GOWN DISPOSABLE) ×4
HANDPIECE INTERPULSE COAX TIP (DISPOSABLE) ×2
HOOD PEEL AWAY FACE SHEILD DIS (HOOD) ×9 IMPLANT
IMMOBILIZER KNEE 22 UNIV (SOFTGOODS) ×3 IMPLANT
KIT BASIN OR (CUSTOM PROCEDURE TRAY) ×3 IMPLANT
KIT ROOM TURNOVER OR (KITS) ×3 IMPLANT
MANIFOLD NEPTUNE II (INSTRUMENTS) ×3 IMPLANT
NEEDLE 22X1 1/2 (OR ONLY) (NEEDLE) ×3 IMPLANT
NS IRRIG 1000ML POUR BTL (IV SOLUTION) ×3 IMPLANT
PACK TOTAL JOINT (CUSTOM PROCEDURE TRAY) ×3 IMPLANT
PAD ARMBOARD 7.5X6 YLW CONV (MISCELLANEOUS) ×3 IMPLANT
PADDING CAST COTTON 6X4 STRL (CAST SUPPLIES) ×3 IMPLANT
SET HNDPC FAN SPRY TIP SCT (DISPOSABLE) ×1 IMPLANT
STAPLER VISISTAT 35W (STAPLE) IMPLANT
STRIP CLOSURE SKIN 1/2X4 (GAUZE/BANDAGES/DRESSINGS) ×4 IMPLANT
SUCTION FRAZIER HANDLE 10FR (MISCELLANEOUS) ×2
SUCTION TUBE FRAZIER 10FR DISP (MISCELLANEOUS) ×1 IMPLANT
SUT MNCRL AB 3-0 PS2 18 (SUTURE) ×3 IMPLANT
SUT VIC AB 0 CTB1 27 (SUTURE) ×6 IMPLANT
SUT VIC AB 1 CT1 27 (SUTURE) ×4
SUT VIC AB 1 CT1 27XBRD ANBCTR (SUTURE) ×2 IMPLANT
SUT VIC AB 2-0 CTB1 (SUTURE) ×6 IMPLANT
SYR 50ML LL SCALE MARK (SYRINGE) ×3 IMPLANT
TOWEL OR 17X24 6PK STRL BLUE (TOWEL DISPOSABLE) ×3 IMPLANT
TOWEL OR 17X26 10 PK STRL BLUE (TOWEL DISPOSABLE) ×3 IMPLANT
TRAY CATH 16FR W/PLASTIC CATH (SET/KITS/TRAYS/PACK) IMPLANT
TRAY FOLEY W/METER SILVER 16FR (SET/KITS/TRAYS/PACK) IMPLANT
WRAP KNEE MAXI GEL POST OP (GAUZE/BANDAGES/DRESSINGS) ×3 IMPLANT

## 2016-06-20 NOTE — Discharge Instructions (Signed)

## 2016-06-20 NOTE — Transfer of Care (Signed)
Immediate Anesthesia Transfer of Care Note  Patient: Kinley Ferrentino Crespo  Procedure(s) Performed: Procedure(s): TOTAL KNEE ARTHROPLASTY (Right)  Patient Location: PACU  Anesthesia Type:Regional and Spinal  Level of Consciousness: awake, alert  and patient cooperative  Airway & Oxygen Therapy: Patient Spontanous Breathing and Patient connected to nasal cannula oxygen  Post-op Assessment: Report given to RN, Post -op Vital signs reviewed and stable, Patient moving all extremities X 4 and Patient able to stick tongue midline  Post vital signs: Reviewed and stable  Last Vitals:  Vitals:   06/20/16 0917 06/20/16 0950  BP: 104/87 120/84  Pulse: 81 70  Resp: 16 12  Temp: 36.7 C     Last Pain:  Vitals:   06/20/16 0917  TempSrc: Oral  PainSc:       Patients Stated Pain Goal: 3 (22/33/61 2244)  Complications: No apparent anesthesia complications

## 2016-06-20 NOTE — Anesthesia Preprocedure Evaluation (Addendum)
Anesthesia Evaluation  Patient identified by MRN, date of birth, ID band Patient awake    Reviewed: Allergy & Precautions, NPO status , Patient's Chart, lab work & pertinent test results  Airway Mallampati: III  TM Distance: >3 FB Neck ROM: Full    Dental no notable dental hx.    Pulmonary asthma (mild, controlled) , former smoker,    Pulmonary exam normal breath sounds clear to auscultation       Cardiovascular hypertension, Pt. on medications Normal cardiovascular exam Rhythm:Regular Rate:Normal     Neuro/Psych negative neurological ROS  negative psych ROS   GI/Hepatic Neg liver ROS,   Endo/Other  negative endocrine ROS  Renal/GU Renal InsufficiencyRenal disease  negative genitourinary   Musculoskeletal negative musculoskeletal ROS (+)   Abdominal (+) + obese,   Peds negative pediatric ROS (+)  Hematology  (+) anemia ,   Anesthesia Other Findings Obese   Reproductive/Obstetrics negative OB ROS                            Anesthesia Physical Anesthesia Plan  ASA: III  Anesthesia Plan: Regional and Spinal   Post-op Pain Management:  Regional for Post-op pain   Induction: Intravenous  PONV Risk Score and Plan: 1 and Ondansetron, Dexamethasone and Propofol  Airway Management Planned:   Additional Equipment:   Intra-op Plan:   Post-operative Plan:   Informed Consent: I have reviewed the patients History and Physical, chart, labs and discussed the procedure including the risks, benefits and alternatives for the proposed anesthesia with the patient or authorized representative who has indicated his/her understanding and acceptance.   Dental advisory given  Plan Discussed with: CRNA  Anesthesia Plan Comments:         Anesthesia Quick Evaluation

## 2016-06-20 NOTE — Telephone Encounter (Signed)
Got a call from OR , pt here to have surgery and they want to make sure that pt needs procrit and I explained that it is something new for pt who have chronic kidney disease with anemia. He gets 1 injection weekly x 4 weeks. Today his hgb is 12.9.  Dr. Janese Banks states that he does need it , hold if hgb over 13. They will give him injections.

## 2016-06-20 NOTE — Anesthesia Procedure Notes (Signed)
Spinal  Patient location during procedure: OR Start time: 06/20/2016 10:10 AM End time: 06/20/2016 10:20 AM Staffing Anesthesiologist: Adele Barthel P Performed: anesthesiologist  Preanesthetic Checklist Completed: patient identified, surgical consent, pre-op evaluation, timeout performed, IV checked, risks and benefits discussed and monitors and equipment checked Spinal Block Patient position: sitting Prep: DuraPrep Patient monitoring: cardiac monitor, continuous pulse ox and blood pressure Approach: midline Location: L4-5 Injection technique: single-shot Needle Needle type: Pencan  Needle gauge: 24 G Needle length: 9 cm Assessment Sensory level: T10 Additional Notes Functioning IV was confirmed and monitors were applied. Sterile prep and drape, including hand hygiene and sterile gloves were used. The patient was positioned and the spine was prepped. The skin was anesthetized with lidocaine.  Free flow of clear CSF was obtained prior to injecting local anesthetic into the CSF.  The spinal needle aspirated freely following injection.  The needle was carefully withdrawn.  The patient tolerated the procedure well.

## 2016-06-20 NOTE — Progress Notes (Signed)
Orthopedic Tech Progress Note Patient Details:  [name] [birth year] [id number]  CPM Right Knee CPM Right Knee: On Right Knee Flexion (Degrees): 70 Right Knee Extension (Degrees): 0   [name] 06/20/2016, 1:34 PM ohf not applied because pt's weight exceeds durability for frame; RN notified

## 2016-06-20 NOTE — Anesthesia Postprocedure Evaluation (Signed)
Anesthesia Post Note  Patient: [name]  Procedure(s) Performed: Procedure(s) (LRB): TOTAL KNEE ARTHROPLASTY (Right)     Patient location during evaluation: PACU Anesthesia Type: Regional and Spinal Level of consciousness: oriented and awake and alert Pain management: pain level controlled Vital Signs Assessment: post-procedure vital signs reviewed and stable Respiratory status: spontaneous breathing, respiratory function stable and patient connected to nasal cannula oxygen Cardiovascular status: blood pressure returned to baseline and stable Postop Assessment: no headache and no backache Anesthetic complications: no    Last Vitals:  Vitals:   06/20/16 1450 06/20/16 1501  BP: (!) 158/130 (!) 156/95  Pulse: 82 85  Resp: 19 (!) 21  Temp:  36.7 C    Last Pain:  Vitals:   06/20/16 1501  TempSrc:   PainSc: 7     LLE Motor Response: Purposeful movement (06/20/16 1501) LLE Sensation: Tingling (06/20/16 1501) RLE Motor Response: Purposeful movement (06/20/16 1501) RLE Sensation: Tingling (06/20/16 1501) L Sensory Level: S1-Sole of foot, small toes (06/20/16 1501) R Sensory Level: S1-Sole of foot, small toes (06/20/16 1501)  Ryan P Ellender

## 2016-06-20 NOTE — Evaluation (Signed)
Physical Therapy Evaluation Patient Details Name: [name] MRN: [id number] DOB: 02-22-1950 Today's Date: 06/20/2016   History of Present Illness  Pt is 66 y/o male s/p elective R TKA, secondary to R knee OA. PMH includes HTN, fatigue, L knee arthroscopy, and asthma.   Clinical Impression  Pt s/p surgery above with deficits below. PTA, pt was independent with use of RW. Upon eval, pt limited by post op pain and weakness, as well as decreased balance. Pt with increased pain and slight knee buckling during ambulation, so ambulation distance limited. Required min A for balance and safety during gait and other functional mobility tasks. Pt reports wife will be able to assist as needed upon d/c. Follow up recommendations per MD arrangements. Will continue to follow and progress mobility according to pt tolerance.     Follow Up Recommendations DC plan and follow up therapy as arranged by surgeon;Supervision/Assistance - 24 hour    Equipment Recommendations  None recommended by PT    Recommendations for Other Services       Precautions / Restrictions Precautions Precautions: Knee Precaution Booklet Issued: Yes (comment) Precaution Comments: Reviewed supine ther ex with pt  Required Braces or Orthoses: Knee Immobilizer - Right Knee Immobilizer - Right: Other (comment) (until discontinued) Restrictions Weight Bearing Restrictions: Yes RLE Weight Bearing: Weight bearing as tolerated      Mobility  Bed Mobility Overal bed mobility: Needs Assistance Bed Mobility: Supine to Sit     Supine to sit: Min assist     General bed mobility comments: Min A for RLE management. Use of elevated HOB and bed rails.  Transfers Overall transfer level: Needs assistance Equipment used: Rolling walker (2 wheeled) Transfers: Sit to/from Stand Sit to Stand: Min assist;From elevated surface         General transfer comment: Min A for lift assist and steadying and slightly elevated surface  to stand. Verbal cues for safe hand placement.   Ambulation/Gait Ambulation/Gait assistance: Min assist Ambulation Distance (Feet): 10 Feet Assistive device: Rolling walker (2 wheeled) Gait Pattern/deviations: Step-through pattern;Decreased step length - right;Decreased weight shift to right;Trunk flexed;Antalgic Gait velocity: Decreased Gait velocity interpretation: Below normal speed for age/gender General Gait Details: Slow, antalgic gait. Pt with increased pain and slight knee buckling during ambulation therefore distance limited. Min A for balance throughout. Verbal cues for upright posture and sequencing with RW.   Stairs            Wheelchair Mobility    Modified Rankin (Stroke Patients Only)       Balance Overall balance assessment: Needs assistance Sitting-balance support: No upper extremity supported;Feet supported Sitting balance-Leahy Scale: Good     Standing balance support: Bilateral upper extremity supported;During functional activity Standing balance-Leahy Scale: Poor Standing balance comment: Reliant on RW for stability                              Pertinent Vitals/Pain Pain Assessment: 0-10 Pain Score: 7  Pain Location: R knee Pain Descriptors / Indicators: Aching;Operative site guarding;Grimacing Pain Intervention(s): Limited activity within patient's tolerance;Monitored during session;Repositioned    Home Living Family/patient expects to be discharged to:: Private residence Living Arrangements: Spouse/significant other;Children Available Help at Discharge: Family;Available 24 hours/day Type of Home: House Home Access: Stairs to enter Entrance Stairs-Rails: Left Entrance Stairs-Number of Steps: 5 Home Layout: One level Home Equipment: Walker - 2 wheels;Bedside commode;Cane - single point      Prior Function Level  of Independence: Independent with assistive device(s)         Comments: used cane secondary to pain      Hand  Dominance   Dominant Hand: Right    Extremity/Trunk Assessment   Upper Extremity Assessment Upper Extremity Assessment: Overall WFL for tasks assessed    Lower Extremity Assessment Lower Extremity Assessment: RLE deficits/detail RLE Deficits / Details: Sensory in tact. Deficits consistent with post op pain and weakness. Able to perform exercise below.     Cervical / Trunk Assessment Cervical / Trunk Assessment: Normal  Communication   Communication: No difficulties  Cognition Arousal/Alertness: Awake/alert Behavior During Therapy: WFL for tasks assessed/performed Overall Cognitive Status: Within Functional Limits for tasks assessed                                        General Comments General comments (skin integrity, edema, etc.): Pt's wife present throughout. Pt requesting to use shoes during session as they give him more support. Educated about use of bone foam and CPM.     Exercises Total Joint Exercises Ankle Circles/Pumps: AROM;Both;10 reps;Supine Quad Sets: AROM;Right;10 reps;Supine Towel Squeeze: AROM;Both;10 reps;Supine Short Arc Quad: AAROM;Right;10 reps;Supine Heel Slides: AROM;Right;10 reps;Supine Hip ABduction/ADduction: AROM;Right;10 reps;Supine   Assessment/Plan    PT Assessment Patient needs continued PT services  PT Problem List Decreased strength;Decreased range of motion;Decreased activity tolerance;Decreased balance;Decreased mobility;Decreased knowledge of use of DME;Decreased knowledge of precautions;Pain       PT Treatment Interventions DME instruction;Gait training;Functional mobility training;Stair training;Therapeutic activities;Therapeutic exercise;Neuromuscular re-education;Balance training;Patient/family education    PT Goals (Current goals can be found in the Care Plan section)  Acute Rehab PT Goals Patient Stated Goal: to go home  PT Goal Formulation: With patient Time For Goal Achievement: 06/27/16 Potential to  Achieve Goals: Good    Frequency 7X/week   Barriers to discharge        Co-evaluation               AM-PAC PT "6 Clicks" Daily Activity  Outcome Measure Difficulty turning over in bed (including adjusting bedclothes, sheets and blankets)?: Total Difficulty moving from lying on back to sitting on the side of the bed? : Total Difficulty sitting down on and standing up from a chair with arms (e.g., wheelchair, bedside commode, etc,.)?: Total Help needed moving to and from a bed to chair (including a wheelchair)?: A Little Help needed walking in hospital room?: A Little Help needed climbing 3-5 steps with a railing? : A Lot 6 Click Score: 11    End of Session Equipment Utilized During Treatment: Gait belt;Right knee immobilizer Activity Tolerance: Patient limited by pain Patient left: in chair;with call bell/phone within reach;with family/visitor present Nurse Communication: Mobility status PT Visit Diagnosis: Other abnormalities of gait and mobility (R26.89);Pain Pain - Right/Left: Right Pain - part of body: Knee    Time: 4259-5638 PT Time Calculation (min) (ACUTE ONLY): 33 min   Charges:   PT Evaluation $PT Eval Low Complexity: 1 Procedure PT Treatments $Gait Training: 8-22 mins   PT G Codes:        Leighton Ruff, PT, DPT  Acute Rehabilitation Services  Pager: [phone]   Rudean Hitt 06/20/2016, 7:00 PM

## 2016-06-20 NOTE — Anesthesia Procedure Notes (Signed)
Anesthesia Regional Block: Adductor canal block   Pre-Anesthetic Checklist: ,, timeout performed, Correct Patient, Correct Site, Correct Laterality, Correct Procedure,, site marked, risks and benefits discussed, Surgical consent,  Pre-op evaluation,  At surgeon's request and post-op pain management  Laterality: Right  Prep: chloraprep       Needles:  Injection technique: Single-shot  Needle Type: Echogenic Stimulator Needle     Needle Length: 9cm  Needle Gauge: 21     Additional Needles:   Procedures: ultrasound guided,,,,,,,,  Narrative:  Start time: 06/20/2016 9:40 AM End time: 06/20/2016 9:50 AM Injection made incrementally with aspirations every 5 mL.  Performed by: Personally  Anesthesiologist: Adele Barthel P  Additional Notes: Functioning IV was confirmed and monitors were applied.  A 48mm 21ga Arrow echogenic stimulator needle was used. Sterile prep,hand hygiene and sterile gloves were used.  Negative aspiration and negative test dose prior to incremental administration of local anesthetic. The patient tolerated the procedure well.

## 2016-06-20 NOTE — Progress Notes (Signed)
Rechecked patient Hgb through I-Stat 4, Hgb 12.9, notified Dr. Elroy Channel nurse. Instructed to give as ordered.

## 2016-06-20 NOTE — Care Management Note (Signed)
Case Management Note  Patient Details  Name: [name] MRN: [id number] Date of Birth: December 04, 1950  Subjective/Objective:  66 yr old gentleman s/p right total knee arthroplasty.                  Action/Plan: Patient was preoperatively setup with [facility], no changes. Case manager spoke with patient to discuss discharge plan. DME has been delivered to his home, CPM will be delivered at discharge. Patient resides with wife, and will have family support at discharge.    Expected Discharge Date:    06/22/16              Expected Discharge Plan:  [place]  In-House Referral:  NA  Discharge planning Services  CM Consult  Post Acute Care Choice:  Durable Medical Equipment, Home Health Choice offered to:  Patient  DME Arranged:  CPM, RW 3in1 DME Agency:  [employer]  HH Arranged:  PT HH Agency:  [facility] (formerly [employer])  Status of Service:  Completed  If discussed at [employer], dates discussed:    Additional Comments:  Ninfa Meeker, RN 06/20/2016, 4:00 PM

## 2016-06-21 LAB — BASIC METABOLIC PANEL
Anion gap: 8 (ref 5–15)
BUN: 27 mg/dL — ABNORMAL HIGH (ref 6–20)
CHLORIDE: 99 mmol/L — AB (ref 101–111)
CO2: 23 mmol/L (ref 22–32)
CREATININE: 1.6 mg/dL — AB (ref 0.61–1.24)
Calcium: 8.8 mg/dL — ABNORMAL LOW (ref 8.9–10.3)
GFR calc Af Amer: 51 mL/min — ABNORMAL LOW (ref >60)
GFR calc non Af Amer: 44 mL/min — ABNORMAL LOW (ref >60)
Glucose, Bld: 142 mg/dL — ABNORMAL HIGH (ref 65–99)
POTASSIUM: 5 mmol/L (ref 3.5–5.1)
Sodium: 130 mmol/L — ABNORMAL LOW (ref 135–145)

## 2016-06-21 LAB — CBC
HCT: 35.1 % — ABNORMAL LOW (ref 39.0–52.0)
HEMOGLOBIN: 11.1 g/dL — AB (ref 13.0–17.0)
MCH: 28.8 pg (ref 26.0–34.0)
MCHC: 31.6 g/dL (ref 30.0–36.0)
MCV: 91.2 fL (ref 78.0–100.0)
Platelets: 226 10*3/uL (ref 150–400)
RBC: 3.85 MIL/uL — ABNORMAL LOW (ref 4.22–5.81)
RDW: 14.7 % (ref 11.5–15.5)
WBC: 9.9 10*3/uL (ref 4.0–10.5)

## 2016-06-21 NOTE — Progress Notes (Addendum)
   PATIENT ID: [name]   1 Day Post-Op Procedure(s) (LRB): TOTAL KNEE ARTHROPLASTY (Right)  Subjective: Reports doing okay, increased pain with getting up with PT last night.   Objective:  Vitals:   06/21/16 0104 06/21/16 0421  BP: (!) 155/84 (!) 141/76  Pulse: (!) 104 (!) 102  Resp: 18 18  Temp: 99.8 F (37.7 C) 99.5 F (37.5 C)     Right knee dressing c/d/i Wiggles toes, distally NVI Comparments soft  Labs:   Recent Labs  06/20/16 0920 06/21/16 0713  HGB 12.9* 11.1*   Recent Labs  06/20/16 0920 06/21/16 0713  WBC  --  9.9  RBC  --  3.85*  HCT 38.0* 35.1*  PLT  --  226   Recent Labs  06/20/16 0920 06/21/16 0713  NA 138 130*  K 5.1 5.0  CL  --  99*  CO2  --  23  BUN  --  27*  CREATININE  --  1.60*  GLUCOSE 109* 142*  CALCIUM  --  8.8*    Assessment and Plan: 1 day s/p R TKA Up with PT D/c home when cleared by PT, likely tomorrow Continue current pain mgmt, likely will need to stay another night for pain mgmt and creatinine monitoring Creatinine at baseline, hyponatremic from continuous IV fluids, now will just [facility] since taking POs and continue to monitor  VTE proph: ASA, SCDs

## 2016-06-21 NOTE — Progress Notes (Signed)
Physical Therapy Treatment Patient Details Name: [name] MRN: [id number] DOB: [birth year] Today's Date: 06/21/2016    History of Present Illness Pt is 66 y/o male s/p elective R TKA, secondary to R knee OA. PMH includes HTN, fatigue, L knee arthroscopy, and asthma.     PT Comments    Improved ability to transfer and to ambulate this morning. Still having some pain and spasming issues.Therapy will work with the patient again in the afternoon. If his pain is controlled enough we will work on [employer]. If he does well with the stairs and is comfortable with his pain with his mobility he may be able to go home.   Follow Up Recommendations  DC plan and follow up therapy as arranged by surgeon;Supervision/Assistance - 24 hour     Equipment Recommendations  None recommended by PT    Recommendations for Other Services       Precautions / Restrictions Precautions Precautions: Knee Precaution Booklet Issued: Yes (comment) Precaution Comments: Reviewed supine ther ex with pt  Required Braces or Orthoses: Knee Immobilizer - Right Knee Immobilizer - Right: Other (comment) Restrictions Weight Bearing Restrictions: Yes RLE Weight Bearing: Weight bearing as tolerated    Mobility  Bed Mobility Overal bed mobility: Needs Assistance Bed Mobility: Supine to Sit     Supine to sit: Min assist;Min guard     General bed mobility comments: Imprroved ability to transfer out of bed. Able to use his left leg to assist his right   Transfers Overall transfer level: Needs assistance Equipment used: Rolling walker (2 wheeled) Transfers: Sit to/from Stand Sit to Stand: From elevated surface;Min guard         General transfer comment: Cuibng for hand palcement but patient able to stand from elevated surface without assist from therapy   Ambulation/Gait Ambulation/Gait assistance: Min guard Ambulation Distance (Feet): 90 Feet Assistive device: Rolling walker (2 wheeled) Gait  Pattern/deviations: Step-through pattern;Decreased step length - right;Decreased weight shift to right;Trunk flexed;Antalgic Gait velocity: Decreased Gait velocity interpretation: Below normal speed for age/gender General Gait Details: Slow, antalgic gait. Improved abiolity to weight bear No buckling noted this morning. No signifciant increase in pain theis afternoon.    Stairs            Wheelchair Mobility    Modified Rankin (Stroke Patients Only)       Balance Overall balance assessment: Needs assistance Sitting-balance support: No upper extremity supported;Feet supported Sitting balance-Leahy Scale: Good     Standing balance support: Bilateral upper extremity supported;During functional activity Standing balance-Leahy Scale: Poor Standing balance comment: Reliant on RW for stability                             Cognition Arousal/Alertness: Awake/alert Behavior During Therapy: WFL for tasks assessed/performed Overall Cognitive Status: Within Functional Limits for tasks assessed                                        Exercises Total Joint Exercises Ankle Circles/Pumps: 20 reps Quad Sets: 10 reps Heel Slides: 10 reps (with towel ) Hip ABduction/ADduction: AROM;Right;10 reps;Supine Goniometric ROM: 6-78    General Comments        Pertinent Vitals/Pain Pain Assessment: 0-10 Pain Score: 8  Pain Location: R knee Pain Descriptors / Indicators: Aching;Operative site guarding;Grimacing Pain Intervention(s): Limited activity within patient's tolerance;Monitored during session;Premedicated before  session;Repositioned;Relaxation    Home Living                      Prior Function            PT Goals (current goals can now be found in the care plan section) Acute Rehab PT Goals Patient Stated Goal: to go home  PT Goal Formulation: With patient Time For Goal Achievement: 06/27/16 Potential to Achieve Goals: Good     Frequency    7X/week      PT Plan Current plan remains appropriate    Co-evaluation              AM-PAC PT "6 Clicks" Daily Activity  Outcome Measure  Difficulty turning over in bed (including adjusting bedclothes, sheets and blankets)?: Total Difficulty moving from lying on back to sitting on the side of the bed? : Total Difficulty sitting down on and standing up from a chair with arms (e.g., wheelchair, bedside commode, etc,.)?: Total Help needed moving to and from a bed to chair (including a wheelchair)?: A Little Help needed walking in hospital room?: A Little Help needed climbing 3-5 steps with a railing? : A Lot 6 Click Score: 11    End of Session Equipment Utilized During Treatment: Gait belt;Right knee immobilizer Activity Tolerance: Patient limited by pain Patient left: in chair;with call bell/phone within reach;with family/visitor present Nurse Communication: Mobility status PT Visit Diagnosis: Other abnormalities of gait and mobility (R26.89);Pain Pain - Right/Left: Right Pain - part of body: Knee     Time: 3276-1470 PT Time Calculation (min) (ACUTE ONLY): 35 min  Charges:  $Gait Training: 8-22 mins $Therapeutic Exercise: 8-22 mins                    G Codes:          Carney Living PT DPT  06/21/2016, 11:48 AM

## 2016-06-21 NOTE — Progress Notes (Signed)
Physical Therapy Treatment Patient Details Name: [name] MRN: [id number] DOB: 1950/06/11 Today's Date: 06/21/2016    History of Present Illness Pt is 66 y/o male s/p elective R TKA, secondary to R knee OA. PMH includes HTN, fatigue, L knee arthroscopy, and asthma.     PT Comments    Patient does not feel comfortable with the stairs yet. He only has handrails on the left side and he required both hand rails. He may try to see if a friend can make him a hand rail. He would benefit from more stair training tomorrow morning. He does not feel confident in either knee at this time. Therapy spoke with nursing about staying 1 more night.    Follow Up Recommendations  DC plan and follow up therapy as arranged by surgeon;Supervision/Assistance - 24 hour     Equipment Recommendations  None recommended by PT    Recommendations for Other Services       Precautions / Restrictions Precautions Precautions: Knee Precaution Booklet Issued: Yes (comment) Precaution Comments: Reviewed supine ther ex with pt  Required Braces or Orthoses: Knee Immobilizer - Right Knee Immobilizer - Right: Other (comment) Restrictions Weight Bearing Restrictions: Yes RLE Weight Bearing: Weight bearing as tolerated    Mobility  Bed Mobility Overal bed mobility: Needs Assistance Bed Mobility: Supine to Sit     Supine to sit: Min guard     General bed mobility comments: able to get himself to the edge of the bed using his left leg to assist his right   Transfers Overall transfer level: Needs assistance Equipment used: Rolling walker (2 wheeled) Transfers: Sit to/from Stand Sit to Stand: From elevated surface;Min guard         General transfer comment: Cuing for hand palcement but patient able to stand from elevated surface without assist from therapy   Ambulation/Gait Ambulation/Gait assistance: Min guard Ambulation Distance (Feet): 25 Feet Assistive device: Rolling walker (2  wheeled) Gait Pattern/deviations: Step-through pattern;Decreased step length - right;Decreased weight shift to right;Trunk flexed;Antalgic Gait velocity: Decreased Gait velocity interpretation: Below normal speed for age/gender General Gait Details: Slow, antalgic gait. Improved abiolity to weight bear No buckling noted this morning. No signifciant increase in pain theis afternoon.    Stairs Stairs: Yes   Stair Management: Two rails Number of Stairs: 10 (2x5) General stair comments: Patient required 2 hand rails and moderate cuing to get up the steps.  He did not feel like his right leg could hold him to get his left leg up. He also did not feel like his left leg was strong enough to get him up the steps. He did better on the second trial. He may benefit from futher stair training before returning hom.   Wheelchair Mobility    Modified Rankin (Stroke Patients Only)       Balance Overall balance assessment: Needs assistance Sitting-balance support: No upper extremity supported;Feet supported Sitting balance-Leahy Scale: Good     Standing balance support: Bilateral upper extremity supported;During functional activity Standing balance-Leahy Scale: Poor Standing balance comment: Reliant on RW for stability                             Cognition Arousal/Alertness: Awake/alert Behavior During Therapy: WFL for tasks assessed/performed Overall Cognitive Status: Within Functional Limits for tasks assessed  Exercises Total Joint Exercises Ankle Circles/Pumps: 20 reps Quad Sets: 10 reps Heel Slides: 10 reps Hip ABduction/ADduction: AROM;Right;10 reps;Supine Goniometric ROM: 6-78    General Comments        Pertinent Vitals/Pain Pain Assessment: 0-10 Pain Score: 6  Pain Location: R knee Pain Descriptors / Indicators: Aching;Operative site guarding;Grimacing Pain Intervention(s): Limited activity within  patient's tolerance    Home Living                      Prior Function            PT Goals (current goals can now be found in the care plan section) Acute Rehab PT Goals Patient Stated Goal: to go home  PT Goal Formulation: With patient Time For Goal Achievement: 06/27/16 Potential to Achieve Goals: Good    Frequency    7X/week      PT Plan Current plan remains appropriate    Co-evaluation              AM-PAC PT "6 Clicks" Daily Activity  Outcome Measure  Difficulty turning over in bed (including adjusting bedclothes, sheets and blankets)?: Total Difficulty moving from lying on back to sitting on the side of the bed? : Total Difficulty sitting down on and standing up from a chair with arms (e.g., wheelchair, bedside commode, etc,.)?: Total Help needed moving to and from a bed to chair (including a wheelchair)?: A Little Help needed walking in hospital room?: A Little Help needed climbing 3-5 steps with a railing? : A Lot 6 Click Score: 11    End of Session Equipment Utilized During Treatment: Gait belt;Right knee immobilizer Activity Tolerance: Patient limited by pain Patient left: in chair;with call bell/phone within reach;with family/visitor present Nurse Communication: Mobility status PT Visit Diagnosis: Other abnormalities of gait and mobility (R26.89);Pain Pain - Right/Left: Right Pain - part of body: Knee     Time: 1320-1350 PT Time Calculation (min) (ACUTE ONLY): 30 min  Charges:  $Gait Training: 23-37 mins $Therapeutic Exercise: 8-22 mins                    G Codes:         Carney Living PT DPT  06/21/2016, 2:52 PM

## 2016-06-21 NOTE — Progress Notes (Signed)
Nutrition Brief Note  Patient identified on the Malnutrition Screening Tool (MST) Report  Wt Readings from Last 15 Encounters:  06/13/16 266 lb 3 oz (120.7 kg)  06/11/16 266 lb 3 oz (120.7 kg)  06/09/16 263 lb 14.4 oz (119.7 kg)  05/12/16 263 lb 5.4 oz (119.4 kg)  05/05/16 265 lb 14 oz (120.6 kg)  03/26/16 270 lb 12.8 oz (122.8 kg)  03/11/16 269 lb 6 oz (122.2 kg)  10/10/15 299 lb 8 oz (135.9 kg)  06/20/15 294 lb 12.8 oz (133.7 kg)  05/03/15 285 lb (129.3 kg)  10/31/14 285 lb 7 oz (129.5 kg)  10/19/14 285 lb 3.2 oz (129.4 kg)  09/20/14 293 lb 1.6 oz (132.9 kg)   Current diet order is regular, patient is consuming approximately 100% of meals at this time. Intake has been adequate. Labs and medications reviewed. Pt with no observed significant fat or muscle mass loss.  No nutrition interventions warranted at this time. If nutrition issues arise, please consult RD.   Corrin Parker, MS, RD, LDN Pager # [phone] After hours/ weekend pager # [phone]

## 2016-06-22 LAB — BASIC METABOLIC PANEL
ANION GAP: 10 (ref 5–15)
BUN: 32 mg/dL — ABNORMAL HIGH (ref 6–20)
CO2: 23 mmol/L (ref 22–32)
CREATININE: 1.76 mg/dL — AB (ref 0.61–1.24)
Calcium: 8.7 mg/dL — ABNORMAL LOW (ref 8.9–10.3)
Chloride: 99 mmol/L — ABNORMAL LOW (ref 101–111)
GFR calc Af Amer: 45 mL/min — ABNORMAL LOW (ref >60)
GFR, EST NON AFRICAN AMERICAN: 39 mL/min — AB (ref >60)
GLUCOSE: 115 mg/dL — AB (ref 65–99)
Potassium: 4.7 mmol/L (ref 3.5–5.1)
Sodium: 132 mmol/L — ABNORMAL LOW (ref 135–145)

## 2016-06-22 LAB — CBC
HEMATOCRIT: 32.1 % — AB (ref 39.0–52.0)
Hemoglobin: 10.2 g/dL — ABNORMAL LOW (ref 13.0–17.0)
MCH: 28.8 pg (ref 26.0–34.0)
MCHC: 31.8 g/dL (ref 30.0–36.0)
MCV: 90.7 fL (ref 78.0–100.0)
PLATELETS: 217 10*3/uL (ref 150–400)
RBC: 3.54 MIL/uL — ABNORMAL LOW (ref 4.22–5.81)
RDW: 14.6 % (ref 11.5–15.5)
WBC: 10.7 10*3/uL — AB (ref 4.0–10.5)

## 2016-06-22 MED ORDER — TRAMADOL HCL 50 MG PO TABS
50.0000 mg | ORAL_TABLET | Freq: Four times a day (QID) | ORAL | 0 refills | Status: DC | PRN
Start: 2016-06-22 — End: 2017-06-02

## 2016-06-22 NOTE — Progress Notes (Signed)
   PATIENT ID: Harless Litten Criswell   2 Days Post-Op Procedure(s) (LRB): TOTAL KNEE ARTHROPLASTY (Right)  Subjective: Feels good today, ready to go home. Walked the hall and stairs with PT.   Objective:  Vitals:   06/21/16 2127 06/22/16 0658  BP: 132/82 102/66  Pulse: (!) 108 (!) 102  Resp: 16 16  Temp: 99.8 F (37.7 C) 98.8 F (37.1 C)     R knee dressing c/d/i Wiggles toes, distally NVI Compartments soft, nontender  Labs:   Recent Labs  06/20/16 0920 06/21/16 0713 06/22/16 0411  HGB 12.9* 11.1* 10.2*   Recent Labs  06/21/16 0713 06/22/16 0411  WBC 9.9 10.7*  RBC 3.85* 3.54*  HCT 35.1* 32.1*  PLT 226 217   Recent Labs  06/21/16 0713 06/22/16 0411  NA 130* 132*  K 5.0 4.7  CL 99* 99*  CO2 23 23  BUN 27* 32*  CREATININE 1.60* 1.76*  GLUCOSE 142* 115*  CALCIUM 8.8* 8.7*    Assessment and Plan: 2 days s/p R TKA Cleared by PT D/c home today, orders in  creatinine and labs at baseline, will fu with PCP regarding mgmt chronic kidney disease req script for tramadol instead of oxycodone Fu with Dr. Berenice Primas  VTE proph: ASA, SCDs

## 2016-06-22 NOTE — Progress Notes (Signed)
Physical Therapy Treatment Patient Details Name: [name] MRN: [id number] DOB: 1950-05-12 Today's Date: 06/22/2016    History of Present Illness Pt is 66 y/o male s/p elective R TKA, secondary to R knee OA. PMH includes HTN, fatigue, L knee arthroscopy, and asthma.     PT Comments    Pt demonstrates improved tolerance for increased gait distance and stair negotiation. Performed stairs backwards with improved sequencing and stability noted. Pt is able to recall and perform all exercises in HEP. Pt is expected to discharge this afternoon with 1 therapy session. Will continue to follow if pt remains in hospital.     Follow Up Recommendations  DC plan and follow up therapy as arranged by surgeon;Supervision/Assistance - 24 hour     Equipment Recommendations  None recommended by PT    Recommendations for Other Services       Precautions / Restrictions Precautions Precautions: Knee Precaution Booklet Issued: Yes (comment) Precaution Comments: Reviewed supine ther ex with pt  Required Braces or Orthoses: Knee Immobilizer - Right Knee Immobilizer - Right: Other (comment) (until discontinued) Restrictions Weight Bearing Restrictions: Yes RLE Weight Bearing: Weight bearing as tolerated    Mobility  Bed Mobility Overal bed mobility: Needs Assistance Bed Mobility: Supine to Sit     Supine to sit: Min guard     General bed mobility comments: able to get himself to the edge of the bed using his left leg to assist his right   Transfers Overall transfer level: Needs assistance Equipment used: Rolling walker (2 wheeled) Transfers: Sit to/from Stand Sit to Stand: From elevated surface;Min guard         General transfer comment: Cuing for hand palcement but patient able to stand from elevated surface without assist from therapy   Ambulation/Gait Ambulation/Gait assistance: Min guard Ambulation Distance (Feet): 75 Feet Assistive device: Rolling walker (2  wheeled) Gait Pattern/deviations: Step-through pattern;Decreased step length - right;Decreased weight shift to right;Trunk flexed;Antalgic Gait velocity: Decreased Gait velocity interpretation: Below normal speed for age/gender General Gait Details: slow antalgic gait. Step to sequencing and improved weight bearing through LLE.    Stairs Stairs: Yes   Stair Management: One rail Left;No rails;Step to pattern;Forwards;Backwards;With walker Number of Stairs: 4 (2x2) General stair comments: Pt continues to have difficulty ascending stairs forward. Attempted sideways and forward and pt requires increased assistance. Attempted stairs backwards with RW and pt is able to perform with improved safety, stability and confidence. Handout given for pt to return home to review stair climbing backwards.   Wheelchair Mobility    Modified Rankin (Stroke Patients Only)       Balance Overall balance assessment: Needs assistance Sitting-balance support: No upper extremity supported;Feet supported Sitting balance-Leahy Scale: Good     Standing balance support: Bilateral upper extremity supported;During functional activity Standing balance-Leahy Scale: Poor Standing balance comment: Reliant on RW for stability                             Cognition Arousal/Alertness: Awake/alert Behavior During Therapy: WFL for tasks assessed/performed Overall Cognitive Status: Within Functional Limits for tasks assessed                                        Exercises      General Comments        Pertinent Vitals/Pain Pain Assessment: 0-10 Pain Score:  3  Pain Location: R knee Pain Descriptors / Indicators: Aching;Operative site guarding;Grimacing Pain Intervention(s): Monitored during session;Premedicated before session;Repositioned;Ice applied    Home Living                      Prior Function            PT Goals (current goals can now be found in the care  plan section) Acute Rehab PT Goals Patient Stated Goal: to go home  Progress towards PT goals: Progressing toward goals    Frequency    7X/week      PT Plan Current plan remains appropriate    Co-evaluation              AM-PAC PT "6 Clicks" Daily Activity  Outcome Measure  Difficulty turning over in bed (including adjusting bedclothes, sheets and blankets)?: None Difficulty moving from lying on back to sitting on the side of the bed? : None Difficulty sitting down on and standing up from a chair with arms (e.g., wheelchair, bedside commode, etc,.)?: A Lot Help needed moving to and from a bed to chair (including a wheelchair)?: A Little Help needed walking in hospital room?: A Little Help needed climbing 3-5 steps with a railing? : A Little 6 Click Score: 19    End of Session Equipment Utilized During Treatment: Gait belt;Right knee immobilizer Activity Tolerance: Patient tolerated treatment well Patient left: in chair;with call bell/phone within reach;with nursing/sitter in room Nurse Communication: Mobility status PT Visit Diagnosis: Other abnormalities of gait and mobility (R26.89);Pain Pain - Right/Left: Right Pain - part of body: Knee     Time: 0093-8182 PT Time Calculation (min) (ACUTE ONLY): 30 min  Charges:  $Gait Training: 23-37 mins                    G Codes:       Scheryl Marten PT, DPT  [phone]    Jacqulyn Liner Sloan Leiter 06/22/2016, 9:43 AM

## 2016-06-22 NOTE — Discharge Summary (Signed)
Patient ID: [name] MRN: [id number] DOB/AGE: 1950-04-02 66 y.o.  Admit date: 06/20/2016 Discharge date: 06/22/2016  Admission Diagnoses:  Principal Problem:   Primary osteoarthritis of right knee   Discharge Diagnoses:  Same  Past Medical History:  Diagnosis Date  . Allergic rhinitis   . Anemia   . BPH (benign prostatic hyperplasia)   . Chronic sinusitis   . Complication of anesthesia    work up during surgery 2x in the past   . Decreased libido   . ED (erectile dysfunction)   . Fatigue   . Hematuria   . HTN (hypertension)   . Hypogonadism in male   . Hypokalemia    history of   . IBS (irritable bowel syndrome)   . Low serum vitamin D   . Lumbago   . Migraine   . Mild intermittent asthma   . Reflux   . Shingles   . Unilateral inguinal hernia without obstruction or gangrene     Surgeries: Procedure(s): TOTAL KNEE ARTHROPLASTY on 06/20/2016   Consultants:   Discharged Condition: Improved  Hospital Course: [name] is an 66 y.o. male who was admitted 06/20/2016 for operative treatment ofPrimary osteoarthritis of right knee. Patient has severe unremitting pain that affects sleep, daily activities, and work/hobbies. After pre-op clearance the patient was taken to the operating room on 06/20/2016 and underwent  Procedure(s): TOTAL KNEE ARTHROPLASTY.    Patient was given perioperative antibiotics: Anti-infectives    Start     Dose/Rate Route Frequency Ordered Stop   06/20/16 1600  ceFAZolin (ANCEF) IVPB 2g/100 mL premix     2 g 200 mL/hr over 30 Minutes Intravenous Every 6 hours 06/20/16 1309 06/20/16 2133   06/20/16 0930  ceFAZolin (ANCEF) 3 g in dextrose 5 % 50 mL IVPB     3 g 130 mL/hr over 30 Minutes Intravenous To ShortStay Surgical 06/19/16 0950 06/20/16 1014       Patient was given sequential compression devices, early ambulation, and asa to prevent DVT.  Patient benefited maximally from hospital stay and there were no  complications.    Recent vital signs: Patient Vitals for the past 24 hrs:  BP Temp Temp src Pulse Resp SpO2  06/22/16 0658 102/66 98.8 F (37.1 C) Oral (!) 102 16 99 %  06/21/16 2127 132/82 99.8 F (37.7 C) Oral (!) 108 16 97 %  06/21/16 1800 118/66 - - - 18 97 %     Recent laboratory studies:  Recent Labs  06/21/16 0713 06/22/16 0411  WBC 9.9 10.7*  HGB 11.1* 10.2*  HCT 35.1* 32.1*  PLT 226 217  NA 130* 132*  K 5.0 4.7  CL 99* 99*  CO2 23 23  BUN 27* 32*  CREATININE 1.60* 1.76*  GLUCOSE 142* 115*  CALCIUM 8.8* 8.7*     Discharge Medications:   Allergies as of 06/22/2016      Reactions   Lactose Intolerance (gi) Other (See Comments)   MIGRAINES      Medication List    TAKE these medications   albuterol 108 (90 Base) MCG/ACT inhaler Commonly known as:  PROAIR HFA Inhale 2 puffs into the lungs every 6 (six) hours as needed for wheezing or shortness of breath.   AMINO ACIDS PO Take 1-2 tablets by mouth 2 (two) times daily. Take 2 tablets with lunch & 1 tablet in the evening. Essential Amino Energy   aspirin EC 325 MG tablet Take 1 tablet (325 mg total) by mouth 2 (two)  times daily after a meal. Take x 1 month post op to decrease risk of blood clots.   azelastine 0.05 % ophthalmic solution Commonly known as:  OPTIVAR Place 2 drops into both eyes 2 (two) times daily. What changed:  how much to take  when to take this   docusate sodium 100 MG capsule Commonly known as:  COLACE Take 1 capsule (100 mg total) by mouth 2 (two) times daily.   fexofenadine 180 MG tablet Commonly known as:  ALLEGRA Take 180 mg by mouth daily as needed for allergies or rhinitis.   fexofenadine-pseudoephedrine 180-240 MG 24 hr tablet Commonly known as:  ALLEGRA-D 24 Take 1 tablet by mouth daily as needed (for allergies.).   fluticasone 50 MCG/ACT nasal spray Commonly known as:  FLONASE Place 2 sprays into both nostrils daily. What changed:  when to take this  reasons to  take this   fluticasone furoate-vilanterol 200-25 MCG/INH Aepb Commonly known as:  BREO ELLIPTA Inhale 1 puff into the lungs daily.   JOINT HEALTH PO Take 1 tablet by mouth daily. INSTAFLEX ADVANCED JOINT SUPPORT   montelukast 10 MG tablet Commonly known as:  SINGULAIR Take 1 tablet (10 mg total) by mouth daily.   MULTIVITAMIN ADULTS 50+ PO Take 1 Package by mouth daily. NATURE'S CODE MEN OVER 50 MULTIVITAMIN PACK   mupirocin ointment 2 % Commonly known as:  BACTROBAN APPLY IN EACH NOSTRIL TWICE A DAY FOR 5 DAYS   NF FORMULAS TESTOSTERONE PO Take 2 tablets by mouth daily. NATURE'S PLUS T-MALE SUPPLEMENT   NUTRITIONAL SUPPLEMENT PO Take 1 Bottle by mouth daily. Next Step Fit N Full Shake   NUTRITIONAL SUPPLEMENT Liqd Take 1 scoop by mouth daily as needed (for energy support). Bi[employer] 45X Energizing Berry.   omeprazole 40 MG capsule Commonly known as:  PRILOSEC Take 1 capsule (40 mg total) by mouth every morning.   OVER THE COUNTER MEDICATION Take 1 tablet by mouth daily. TEST-HD TESTOSTERONE SUPPORT   oxyCODONE-acetaminophen 5-325 MG tablet Commonly known as:  PERCOCET/ROXICET Take 1-2 tablets by mouth every 6 (six) hours as needed for severe pain.   tadalafil 5 MG tablet Commonly known as:  CIALIS Take 1 tablet (5 mg total) by mouth daily.   tamsulosin 0.4 MG Caps capsule Commonly known as:  FLOMAX Take 1 capsule (0.4 mg total) by mouth daily. What changed:  when to take this   tiZANidine 2 MG tablet Commonly known as:  ZANAFLEX Take 1 tablet (2 mg total) by mouth every 8 (eight) hours as needed for muscle spasms.   traMADol 50 MG tablet Commonly known as:  ULTRAM Take 100 mg by mouth 4 (four) times daily as needed (for pain).   valsartan 80 MG tablet Commonly known as:  DIOVAN Take 1 tablet (80 mg total) by mouth daily.       Diagnostic Studies: Dg Chest 2 View  Result Date: 06/09/2016 CLINICAL DATA:  Knee replacement.  Preoperative  chest x-ray . EXAM: CHEST  2 VIEW FINDINGS: Mediastinum and hilar structures normal. Heart size normal. No focal infiltrate. No pleural effusion or pneumothorax. Degenerative changes thoracic spine with scoliosis . IMPRESSION: No acute cardiopulmonary disease. Electronically Signed   By: Marcello Moores  Register   On: 06/09/2016 16:13   US Renal  Result Date: 06/12/2016 CLINICAL DATA:  Acute renal failure . EXAM: RENAL / URINARY TRACT ULTRASOUND COMPLETE COMPARISON:  CT 10/11/2015. FINDINGS: Right Kidney: Length: 11.7 cm. Echogenicity within normal limits. No mass or hydronephrosis visualized. Left  Kidney: Length: 13.2 cm. Echogenicity within normal limits. No mass or hydronephrosis visualized. Bladder: Appears normal for degree of bladder distention. IMPRESSION: Negative exam. No focal abnormality identified. No hydronephrosis or bladder distention. Electronically Signed   By: Marcello Moores  Register   On: 06/12/2016 16:39    Disposition:   Discharge Instructions    Call MD / Call 911    Complete by:  As directed    If you experience chest pain or shortness of breath, CALL 911 and be transported to the hospital emergency room.  If you develope a fever above 101 F, pus (white drainage) or increased drainage or redness at the wound, or calf pain, call your surgeon's office.   Constipation Prevention    Complete by:  As directed    Drink plenty of fluids.  Prune juice may be helpful.  You may use a stool softener, such as Colace (over the counter) 100 mg twice a day.  Use MiraLax (over the counter) for constipation as needed.   Diet - low sodium heart healthy    Complete by:  As directed    Increase activity slowly as tolerated    Complete by:  As directed       Follow-up Information    Dorna Leitz, MD. Schedule an appointment as soon as possible for a visit in 2 week(s).   Specialty:  Orthopedic Surgery Contact information: [address] [place] [address] [phone]        [facility]  Follow up.   Specialty:  [place] Why:  A representative from [facility] will contact you to arrange start date and time for your therapy. Contact information: [address] [place] [address] [phone]            Signed: Grier Mitts 06/22/2016, 8:34 AM

## 2016-06-22 NOTE — Progress Notes (Signed)
CM has requested HHPT order and face to face if this is MDs plan; pt is set up with [facility] and rep is waiting for f18f and HHPT order to schedule start of care. No other CM needs were communicated.

## 2016-06-23 ENCOUNTER — Encounter (HOSPITAL_COMMUNITY): Payer: Self-pay | Admitting: Orthopedic Surgery

## 2016-06-23 DIAGNOSIS — I10 Essential (primary) hypertension: Secondary | ICD-10-CM | POA: Diagnosis not present

## 2016-06-23 DIAGNOSIS — M1712 Unilateral primary osteoarthritis, left knee: Secondary | ICD-10-CM | POA: Diagnosis not present

## 2016-06-23 DIAGNOSIS — Z87891 Personal history of nicotine dependence: Secondary | ICD-10-CM | POA: Diagnosis not present

## 2016-06-23 DIAGNOSIS — Z471 Aftercare following joint replacement surgery: Secondary | ICD-10-CM | POA: Diagnosis not present

## 2016-06-23 DIAGNOSIS — J4540 Moderate persistent asthma, uncomplicated: Secondary | ICD-10-CM | POA: Diagnosis not present

## 2016-06-23 DIAGNOSIS — Z96651 Presence of right artificial knee joint: Secondary | ICD-10-CM | POA: Diagnosis not present

## 2016-06-24 DIAGNOSIS — J4540 Moderate persistent asthma, uncomplicated: Secondary | ICD-10-CM | POA: Diagnosis not present

## 2016-06-24 DIAGNOSIS — Z96651 Presence of right artificial knee joint: Secondary | ICD-10-CM | POA: Diagnosis not present

## 2016-06-24 DIAGNOSIS — I10 Essential (primary) hypertension: Secondary | ICD-10-CM | POA: Diagnosis not present

## 2016-06-24 DIAGNOSIS — M1712 Unilateral primary osteoarthritis, left knee: Secondary | ICD-10-CM | POA: Diagnosis not present

## 2016-06-24 DIAGNOSIS — Z87891 Personal history of nicotine dependence: Secondary | ICD-10-CM | POA: Diagnosis not present

## 2016-06-24 DIAGNOSIS — Z471 Aftercare following joint replacement surgery: Secondary | ICD-10-CM | POA: Diagnosis not present

## 2016-06-25 DIAGNOSIS — Z87891 Personal history of nicotine dependence: Secondary | ICD-10-CM | POA: Diagnosis not present

## 2016-06-25 DIAGNOSIS — J4540 Moderate persistent asthma, uncomplicated: Secondary | ICD-10-CM | POA: Diagnosis not present

## 2016-06-25 DIAGNOSIS — Z471 Aftercare following joint replacement surgery: Secondary | ICD-10-CM | POA: Diagnosis not present

## 2016-06-25 DIAGNOSIS — I10 Essential (primary) hypertension: Secondary | ICD-10-CM | POA: Diagnosis not present

## 2016-06-25 DIAGNOSIS — Z96651 Presence of right artificial knee joint: Secondary | ICD-10-CM | POA: Diagnosis not present

## 2016-06-25 DIAGNOSIS — M1712 Unilateral primary osteoarthritis, left knee: Secondary | ICD-10-CM | POA: Diagnosis not present

## 2016-06-27 ENCOUNTER — Ambulatory Visit: Payer: Medicare Other | Admitting: Family Medicine

## 2016-06-27 DIAGNOSIS — Z96651 Presence of right artificial knee joint: Secondary | ICD-10-CM | POA: Diagnosis not present

## 2016-06-27 DIAGNOSIS — Z471 Aftercare following joint replacement surgery: Secondary | ICD-10-CM | POA: Diagnosis not present

## 2016-06-27 DIAGNOSIS — M1712 Unilateral primary osteoarthritis, left knee: Secondary | ICD-10-CM | POA: Diagnosis not present

## 2016-06-27 DIAGNOSIS — I10 Essential (primary) hypertension: Secondary | ICD-10-CM | POA: Diagnosis not present

## 2016-06-27 DIAGNOSIS — J4540 Moderate persistent asthma, uncomplicated: Secondary | ICD-10-CM | POA: Diagnosis not present

## 2016-06-27 DIAGNOSIS — Z87891 Personal history of nicotine dependence: Secondary | ICD-10-CM | POA: Diagnosis not present

## 2016-06-27 NOTE — Progress Notes (Signed)
Op note dictated [id number]

## 2016-06-27 NOTE — Op Note (Signed)
[name], [name] NO.:  [id number]  MEDICAL RECORD NO.:  [id number]  LOCATION:  [facility]                         FACILITY:  [facility]  PHYSICIAN:  Alta Corning, M.D.   DATE OF BIRTH:  [birth year]  DATE OF PROCEDURE:  06/20/2016 DATE OF DISCHARGE:                              OPERATIVE REPORT   PREOPERATIVE DIAGNOSIS:  End-stage degenerative joint disease, right knee.  POSTOPERATIVE DIAGNOSIS:  End-stage degenerative joint disease, right knee.  PROCEDURE PERFORMED:  Right total knee replacement with Attune system size 8 femur, size 9 tibia, 6 mm bridging bearing, and a 41 mm all- polyethylene patella.  SURGEON:  Alta Corning, M.D.  ASSISTANT:  Gary Fleet, PA.  ANESTHESIA:  General.  BRIEF HISTORY:  [name] is a 66 year old male with a long history of complaints of right knee pain.  He had been treated conservatively for a long period of time.  After failure of all conservative care, he is taken to the operating room for right total knee replacement.  Preoperative x-ray showed bone-on-bone change.  He was having night pain and light activity pain prior to surgical intervention.  The patient had tried injection therapy and activity modification prior to surgery.  DESCRIPTION OF PROCEDURE:  The patient was taken to the operating room. After adequate anesthesia was obtained with general anesthetic, the patient was placed supine on the operating table.  The right leg was then prepped and draped in usual sterile fashion.  Following this, the leg was exsanguinated.  Blood pressure tourniquet was inflated to 350 mmHg.  Following this, an incision was made for anterior approach to the knee, subcutaneous tissue, down to the level of extensor mechanism, and a medial parapatellar arthrotomy was undertaken.  Following this, anterior and posterior cruciate were removed as well as retropatellar fat pad as well as synovium in the anterior aspect of the  femur, as well the medial and lateral meniscus.  Following this, intramedullary pilot hole was drilled and an intramedullary rod was placed.  A 4 degree valgus inclination cut was made, followed by sizing of the femur.  With anterior and posterior cuts were made, chamfers and box sizing it to an 8.  Attention was then turned towards the tibia where it was cut perpendicular to the long axis.  It was sized to a 9.  It was drilled and keeled.  Trials were placed.  Trial reduction was undertaken. Excellent range of motion and stability were achieved with a size 6 poly.  At this time, attention was turned to the patella.  It was cut down to a level of 13 mm and a 41 paddle was chosen.  Lugs were drilled for this.  Trial patella was placed.  Knee put through a range of motion.  Excellent stability and range of motion were achieved.  At this time, all trial components were removed.  The knee was copiously and thoroughly lavaged, pulsatile lavage, irrigation, and suctioned dry. The final components were cemented into place, size 8 femur, size 9 tibia, 6 mm bridging bearing trial was placed, and a 41 all-poly patella was placed and held with a clamp.  Once all excess bone cement was removed and the cement was allowed to  completely hardened, the tourniquet was let down.  All bleeding was controlled with electrocautery.  Exparel was placed throughout the synovial reflection for postoperative pain control and the trial poly was removed.  The final size 6 poly was placed.  Excellent range of motion and stability were achieved at this point.  The medial parapatellar arthrotomy was closed with 1 Vicryl running, the skin with 0 and 2-0 Vicryl and 3-0 Monocryl subcuticular.  Benzoin and Steri-Strips were applied.  Sterile compressive dressing was applied.  The patient was taken to the recovery room and was noted to be in satisfactory condition.  Estimated blood loss for the procedure was  minimal.     Alta Corning, M.D.     Corliss Skains  D:  06/27/2016  T:  06/27/2016  Job:  [id number]  cc:   Alta Corning, M.D.

## 2016-06-30 DIAGNOSIS — Z96651 Presence of right artificial knee joint: Secondary | ICD-10-CM | POA: Diagnosis not present

## 2016-06-30 DIAGNOSIS — J4540 Moderate persistent asthma, uncomplicated: Secondary | ICD-10-CM | POA: Diagnosis not present

## 2016-06-30 DIAGNOSIS — M1712 Unilateral primary osteoarthritis, left knee: Secondary | ICD-10-CM | POA: Diagnosis not present

## 2016-06-30 DIAGNOSIS — I10 Essential (primary) hypertension: Secondary | ICD-10-CM | POA: Diagnosis not present

## 2016-06-30 DIAGNOSIS — Z87891 Personal history of nicotine dependence: Secondary | ICD-10-CM | POA: Diagnosis not present

## 2016-06-30 DIAGNOSIS — Z471 Aftercare following joint replacement surgery: Secondary | ICD-10-CM | POA: Diagnosis not present

## 2016-07-01 DIAGNOSIS — M1711 Unilateral primary osteoarthritis, right knee: Secondary | ICD-10-CM | POA: Diagnosis not present

## 2016-07-01 DIAGNOSIS — M25561 Pain in right knee: Secondary | ICD-10-CM | POA: Diagnosis not present

## 2016-07-01 DIAGNOSIS — M25661 Stiffness of right knee, not elsewhere classified: Secondary | ICD-10-CM | POA: Diagnosis not present

## 2016-07-01 DIAGNOSIS — Z96651 Presence of right artificial knee joint: Secondary | ICD-10-CM | POA: Diagnosis not present

## 2016-07-02 DIAGNOSIS — Z96651 Presence of right artificial knee joint: Secondary | ICD-10-CM | POA: Diagnosis not present

## 2016-07-02 DIAGNOSIS — M25661 Stiffness of right knee, not elsewhere classified: Secondary | ICD-10-CM | POA: Diagnosis not present

## 2016-07-02 DIAGNOSIS — M25561 Pain in right knee: Secondary | ICD-10-CM | POA: Diagnosis not present

## 2016-07-04 DIAGNOSIS — Z96651 Presence of right artificial knee joint: Secondary | ICD-10-CM | POA: Diagnosis not present

## 2016-07-04 DIAGNOSIS — M25661 Stiffness of right knee, not elsewhere classified: Secondary | ICD-10-CM | POA: Diagnosis not present

## 2016-07-04 DIAGNOSIS — M25561 Pain in right knee: Secondary | ICD-10-CM | POA: Diagnosis not present

## 2016-07-08 DIAGNOSIS — M25561 Pain in right knee: Secondary | ICD-10-CM | POA: Diagnosis not present

## 2016-07-08 DIAGNOSIS — M25661 Stiffness of right knee, not elsewhere classified: Secondary | ICD-10-CM | POA: Diagnosis not present

## 2016-07-08 DIAGNOSIS — Z96651 Presence of right artificial knee joint: Secondary | ICD-10-CM | POA: Diagnosis not present

## 2016-07-09 ENCOUNTER — Other Ambulatory Visit: Payer: Self-pay | Admitting: Family Medicine

## 2016-07-10 DIAGNOSIS — M25661 Stiffness of right knee, not elsewhere classified: Secondary | ICD-10-CM | POA: Diagnosis not present

## 2016-07-10 DIAGNOSIS — M25561 Pain in right knee: Secondary | ICD-10-CM | POA: Diagnosis not present

## 2016-07-10 DIAGNOSIS — Z96651 Presence of right artificial knee joint: Secondary | ICD-10-CM | POA: Diagnosis not present

## 2016-07-11 ENCOUNTER — Telehealth: Payer: Self-pay | Admitting: Family Medicine

## 2016-07-11 ENCOUNTER — Telehealth: Payer: Self-pay | Admitting: *Deleted

## 2016-07-11 ENCOUNTER — Encounter: Payer: Self-pay | Admitting: Oncology

## 2016-07-11 ENCOUNTER — Other Ambulatory Visit: Payer: Self-pay | Admitting: *Deleted

## 2016-07-11 ENCOUNTER — Inpatient Hospital Stay: Payer: Medicare Other | Attending: Oncology | Admitting: Oncology

## 2016-07-11 ENCOUNTER — Inpatient Hospital Stay (HOSPITAL_BASED_OUTPATIENT_CLINIC_OR_DEPARTMENT_OTHER): Payer: Medicare Other | Admitting: Oncology

## 2016-07-11 VITALS — BP 105/69 | HR 100 | Temp 97.6°F | Resp 18 | Wt 248.3 lb

## 2016-07-11 DIAGNOSIS — E876 Hypokalemia: Secondary | ICD-10-CM | POA: Insufficient documentation

## 2016-07-11 DIAGNOSIS — J329 Chronic sinusitis, unspecified: Secondary | ICD-10-CM | POA: Insufficient documentation

## 2016-07-11 DIAGNOSIS — K449 Diaphragmatic hernia without obstruction or gangrene: Secondary | ICD-10-CM

## 2016-07-11 DIAGNOSIS — Z87891 Personal history of nicotine dependence: Secondary | ICD-10-CM | POA: Diagnosis not present

## 2016-07-11 DIAGNOSIS — I129 Hypertensive chronic kidney disease with stage 1 through stage 4 chronic kidney disease, or unspecified chronic kidney disease: Secondary | ICD-10-CM | POA: Insufficient documentation

## 2016-07-11 DIAGNOSIS — Z7982 Long term (current) use of aspirin: Secondary | ICD-10-CM

## 2016-07-11 DIAGNOSIS — N529 Male erectile dysfunction, unspecified: Secondary | ICD-10-CM

## 2016-07-11 DIAGNOSIS — E291 Testicular hypofunction: Secondary | ICD-10-CM | POA: Diagnosis not present

## 2016-07-11 DIAGNOSIS — M545 Low back pain: Secondary | ICD-10-CM

## 2016-07-11 DIAGNOSIS — K589 Irritable bowel syndrome without diarrhea: Secondary | ICD-10-CM

## 2016-07-11 DIAGNOSIS — E896 Postprocedural adrenocortical (-medullary) hypofunction: Secondary | ICD-10-CM

## 2016-07-11 DIAGNOSIS — N189 Chronic kidney disease, unspecified: Secondary | ICD-10-CM

## 2016-07-11 DIAGNOSIS — R5383 Other fatigue: Secondary | ICD-10-CM | POA: Insufficient documentation

## 2016-07-11 DIAGNOSIS — D638 Anemia in other chronic diseases classified elsewhere: Secondary | ICD-10-CM

## 2016-07-11 DIAGNOSIS — R6882 Decreased libido: Secondary | ICD-10-CM

## 2016-07-11 DIAGNOSIS — K219 Gastro-esophageal reflux disease without esophagitis: Secondary | ICD-10-CM | POA: Insufficient documentation

## 2016-07-11 DIAGNOSIS — M25561 Pain in right knee: Secondary | ICD-10-CM | POA: Diagnosis not present

## 2016-07-11 DIAGNOSIS — D649 Anemia, unspecified: Secondary | ICD-10-CM

## 2016-07-11 DIAGNOSIS — Z8669 Personal history of other diseases of the nervous system and sense organs: Secondary | ICD-10-CM

## 2016-07-11 DIAGNOSIS — J45909 Unspecified asthma, uncomplicated: Secondary | ICD-10-CM

## 2016-07-11 DIAGNOSIS — D631 Anemia in chronic kidney disease: Secondary | ICD-10-CM

## 2016-07-11 DIAGNOSIS — Z79899 Other long term (current) drug therapy: Secondary | ICD-10-CM

## 2016-07-11 DIAGNOSIS — Z471 Aftercare following joint replacement surgery: Secondary | ICD-10-CM | POA: Diagnosis not present

## 2016-07-11 DIAGNOSIS — M1990 Unspecified osteoarthritis, unspecified site: Secondary | ICD-10-CM | POA: Insufficient documentation

## 2016-07-11 DIAGNOSIS — N400 Enlarged prostate without lower urinary tract symptoms: Secondary | ICD-10-CM

## 2016-07-11 DIAGNOSIS — Z96651 Presence of right artificial knee joint: Secondary | ICD-10-CM | POA: Diagnosis not present

## 2016-07-11 LAB — CBC WITH DIFFERENTIAL/PLATELET
Basophils Absolute: 0.1 K/uL (ref 0–0.1)
Basophils Relative: 1 %
Eosinophils Absolute: 0.2 K/uL (ref 0–0.7)
Eosinophils Relative: 2 %
HCT: 31.8 % — ABNORMAL LOW (ref 40.0–52.0)
Hemoglobin: 10.5 g/dL — ABNORMAL LOW (ref 13.0–18.0)
Lymphocytes Relative: 27 %
Lymphs Abs: 2.3 K/uL (ref 1.0–3.6)
MCH: 28.3 pg (ref 26.0–34.0)
MCHC: 33 g/dL (ref 32.0–36.0)
MCV: 85.8 fL (ref 80.0–100.0)
Monocytes Absolute: 0.8 K/uL (ref 0.2–1.0)
Monocytes Relative: 10 %
Neutro Abs: 5.1 K/uL (ref 1.4–6.5)
Neutrophils Relative %: 60 %
Platelets: 387 K/uL (ref 150–440)
RBC: 3.71 MIL/uL — ABNORMAL LOW (ref 4.40–5.90)
RDW: 17.6 % — ABNORMAL HIGH (ref 11.5–14.5)
WBC: 8.6 K/uL (ref 3.8–10.6)

## 2016-07-11 LAB — IRON AND TIBC
Iron: 60 ug/dL (ref 45–182)
SATURATION RATIOS: 21 % (ref 17.9–39.5)
TIBC: 289 ug/dL (ref 250–450)
UIBC: 229 ug/dL

## 2016-07-11 LAB — COMPREHENSIVE METABOLIC PANEL
ALK PHOS: 128 U/L — AB (ref 38–126)
ALT: 47 U/L (ref 17–63)
AST: 35 U/L (ref 15–41)
Albumin: 3.8 g/dL (ref 3.5–5.0)
Anion gap: 9 (ref 5–15)
BILIRUBIN TOTAL: 0.4 mg/dL (ref 0.3–1.2)
BUN: 56 mg/dL — ABNORMAL HIGH (ref 6–20)
CALCIUM: 9.9 mg/dL (ref 8.9–10.3)
CO2: 24 mmol/L (ref 22–32)
Chloride: 104 mmol/L (ref 101–111)
Creatinine, Ser: 2.32 mg/dL — ABNORMAL HIGH (ref 0.61–1.24)
GFR, EST AFRICAN AMERICAN: 32 mL/min — AB (ref >60)
GFR, EST NON AFRICAN AMERICAN: 28 mL/min — AB (ref >60)
Glucose, Bld: 112 mg/dL — ABNORMAL HIGH (ref 65–99)
Potassium: 5.6 mmol/L — ABNORMAL HIGH (ref 3.5–5.1)
Sodium: 137 mmol/L (ref 135–145)
TOTAL PROTEIN: 8.2 g/dL — AB (ref 6.5–8.1)

## 2016-07-11 LAB — FERRITIN: FERRITIN: 838 ng/mL — AB (ref 24–336)

## 2016-07-11 MED ORDER — SODIUM POLYSTYRENE SULFONATE 15 GM/60ML PO SUSP: 30.0000 g | Freq: Every day | ORAL | 0 refills | Status: AC

## 2016-07-11 NOTE — Telephone Encounter (Signed)
Dr. Janese Banks, the hematologist already ordered the studies he will need. He just needs to go to the cancer center for it

## 2016-07-11 NOTE — Progress Notes (Signed)
Please let patient know potassium high. Will give kayexalate for 3 days. Repeat bmp in 4 days. Please notify Dr. Ancil Boozer as well

## 2016-07-11 NOTE — Progress Notes (Signed)
Hello Dr. Holley Raring,  His creatinine continues to go up. Just an FYI...  Thanks, Astrid Divine

## 2016-07-11 NOTE — Progress Notes (Signed)
Hematology/Oncology Consult note [facility]  Telephone:([phone] Fax:([phone]  Patient Care Team: Steele Sizer, MD as PCP - General (Family Medicine)   Name of the patient: [name]  [id number]  January 24, 1950   Date of visit: 07/11/16  Diagnosis- anemia probably secondary to chronic disease/chronic kidney disease. Etiology unclear  Chief complaint/ Reason for visit- routine post op f/u  Heme/Onc history: Patient is a 66 yr old male who is scheduled to undergo right total arthroplasty next month. He has been referred to [place] for his anemia prior to upcoming surgery. Recent cbc from 03/11/16 showed wbc of 6.5, H/H of 10.3/31.6 and platelet count of 210. TSH was normal at 1.63. LFTs were normal. Iron studies showed normal TIBC of 256, normal iron of 50 and iron saturation of 20%. Ferritin was levetaed at 631. 10 months prior to that ferritin was normal. 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 Other PMH includes HTN, OA, asthma.   Results of bloodwork from 05/05/2016 were as follows: CBC showed white count of 7.3, H&H of 10.1/30.4 and a platelet count of 234. CMP was normal except for elevated BUN of 50 and creatinine of 1.97. Ferritin was elevated at 430. Iron studies showed iron saturation of 20%. Coombs test was negative, folate level was normal and B12 level was elevated at 1170. ESR was elevated at 46. Myeloma panel did not reveal any monoclonal protein. epo level was 13.7  Patient received 40000weekly X 4 doses Day -21, Day-14 Day -7 and on the day of knee surgery. His hb went up to 12.9. He did not require blood transfusion post surgery   Interval history- he is recovering well since his surgery on 06/20/16. Hopes to start exercising soon. Denies any complaints today  ECOG PS- 1 Pain scale- 0   Review of systems- Review of Systems  Constitutional: Negative for chills, fever, malaise/fatigue and  weight loss.  HENT: Negative for congestion, ear discharge and nosebleeds.   Eyes: Negative for blurred vision.  Respiratory: Negative for cough, hemoptysis, sputum production, shortness of breath and wheezing.   Cardiovascular: Negative for chest pain, palpitations, orthopnea and claudication.  Gastrointestinal: Negative for abdominal pain, blood in stool, constipation, diarrhea, heartburn, melena, nausea and vomiting.  Genitourinary: Negative for dysuria, flank pain, frequency, hematuria and urgency.  Musculoskeletal: Negative for back pain, joint pain and myalgias.  Skin: Negative for rash.  Neurological: Negative for dizziness, tingling, focal weakness, seizures, weakness and headaches.  Endo/Heme/Allergies: Does not bruise/bleed easily.  Psychiatric/Behavioral: Negative for depression and suicidal ideas. The patient does not have insomnia.       Allergies  Allergen Reactions  . Lactose Intolerance (Gi) Other (See Comments)    MIGRAINES     Past Medical History:  Diagnosis Date  . Allergic rhinitis   . Anemia   . BPH (benign prostatic hyperplasia)   . Chronic sinusitis   . Complication of anesthesia    work up during surgery 2x in the past   . Decreased libido   . ED (erectile dysfunction)   . Fatigue   . Hematuria   . HTN (hypertension)   . Hypogonadism in male   . Hypokalemia    history of   . IBS (irritable bowel syndrome)   . Low serum vitamin D   . Lumbago   . Migraine   . Mild intermittent asthma   . Reflux   . Shingles   . Unilateral inguinal hernia without  obstruction or gangrene      Past Surgical History:  Procedure Laterality Date  . ADRENALECTOMY Left 02/11/2016   [facility]  . COLONOSCOPY  02/2012   normal  . JOINT REPLACEMENT    . KNEE ARTHROSCOPY Left 10/06/2009  . SINUS EXPLORATION    . TOTAL KNEE ARTHROPLASTY Right 06/20/2016  . TOTAL KNEE ARTHROPLASTY Right 06/20/2016   Procedure: TOTAL KNEE ARTHROPLASTY;  Surgeon: Dorna Leitz, MD;  Location:  [place];  Service: Orthopedics;  Laterality: Right;    Social History   Social History  . Marital status: Married    Spouse name: N/A  . Number of children: N/A  . Years of education: N/A   Occupational History  . Not on file.   Social History Main Topics  . Smoking status: Former Smoker    Packs/day: 1.00    Years: 10.00  . Smokeless tobacco: Never Used     Comment: 38 years ago 81 when he stopped  . Alcohol use No  . Drug use: No  . Sexual activity: Yes    Partners: Female   Other Topics Concern  . Not on file   Social History Narrative  . No narrative on file    Family History  Problem Relation Age of Onset  . Diabetes Mother   . Heart disease Mother   . Lung disease Mother   . Seizures Maternal Grandmother      Current Outpatient Prescriptions:  .  albuterol (PROAIR HFA) 108 (90 Base) MCG/ACT inhaler, Inhale 2 puffs into the lungs every 6 (six) hours as needed for wheezing or shortness of breath., Disp: 1 Inhaler, Rfl: 0 .  AMINO ACIDS PO, Take 1-2 tablets by mouth 2 (two) times daily. Take 2 tablets with lunch & 1 tablet in the evening. Essential Amino Energy, Disp: , Rfl:  .  aspirin EC 325 MG tablet, Take 1 tablet (325 mg total) by mouth 2 (two) times daily after a meal. Take x 1 month post op to decrease risk of blood clots., Disp: 60 tablet, Rfl: 0 .  azelastine (OPTIVAR) 0.05 % ophthalmic solution, Place 2 drops into both eyes 2 (two) times daily. (Patient taking differently: Place 1 drop into both eyes daily. ), Disp: 6 mL, Rfl: 5 .  docusate sodium (COLACE) 100 MG capsule, Take 1 capsule (100 mg total) by mouth 2 (two) times daily., Disp: 30 capsule, Rfl: 0 .  fexofenadine (ALLEGRA) 180 MG tablet, Take 180 mg by mouth daily as needed for allergies or rhinitis., Disp: , Rfl:  .  fexofenadine-pseudoephedrine (ALLEGRA-D 24) 180-240 MG 24 hr tablet, Take 1 tablet by mouth daily as needed (for allergies.)., Disp: , Rfl:  .  fluticasone (FLONASE) 50 MCG/ACT nasal  spray, Place 2 sprays into both nostrils daily. (Patient taking differently: Place 2 sprays into both nostrils daily as needed (for allergies.). ), Disp: 16 g, Rfl: 5 .  fluticasone furoate-vilanterol (BREO ELLIPTA) 200-25 MCG/INH AEPB, Inhale 1 puff into the lungs daily., Disp: 60 each, Rfl: 5 .  Glucosamine-MSM-Hyaluronic Acd (JOINT HEALTH PO), Take 1 tablet by mouth daily. INSTAFLEX ADVANCED JOINT SUPPORT, Disp: , Rfl:  .  Misc Natural Products (NF FORMULAS TESTOSTERONE PO), Take 2 tablets by mouth daily. NATURE'S PLUS T-MALE SUPPLEMENT, Disp: , Rfl:  .  montelukast (SINGULAIR) 10 MG tablet, TAKE 1 TABLET (10 MG TOTAL) BY MOUTH DAILY., Disp: 30 tablet, Rfl: 5 .  Multiple Vitamins-Minerals (MULTIVITAMIN ADULTS 50+ PO), Take 1 Package by mouth daily. NATURE'S CODE MEN OVER 50  MULTIVITAMIN PACK, Disp: , Rfl:  .  NUTRITIONAL SUPPLEMENT LIQD, Take 1 scoop by mouth daily as needed (for energy support). [employer] 45X Energizing Berry., Disp: , Rfl:  .  Nutritional Supplements (NUTRITIONAL SUPPLEMENT PO), Take 1 Bottle by mouth daily. Next Step Fit N Full Shake, Disp: , Rfl:  .  tadalafil (CIALIS) 5 MG tablet, Take 1 tablet (5 mg total) by mouth daily., Disp: 30 tablet, Rfl: 5 .  tamsulosin (FLOMAX) 0.4 MG CAPS capsule, Take 1 capsule (0.4 mg total) by mouth daily. (Patient taking differently: Take 0.4 mg by mouth every evening. ), Disp: 30 capsule, Rfl: 5 .  traMADol (ULTRAM) 50 MG tablet, Take 1-2 tablets (50-100 mg total) by mouth every 6 (six) hours as needed (for pain)., Disp: 60 tablet, Rfl: 0 .  valsartan (DIOVAN) 80 MG tablet, Take 1 tablet (80 mg total) by mouth daily., Disp: 30 tablet, Rfl: 6 .  HYDROmorphone (DILAUDID) 2 MG tablet, Take 2 mg by mouth as needed., Disp: , Rfl: 0 .  mupirocin ointment (BACTROBAN) 2 %, APPLY IN EACH NOSTRIL TWICE A DAY FOR 5 DAYS, Disp: , Rfl: 0 .  omeprazole (PRILOSEC) 40 MG capsule, Take 1 capsule (40 mg total) by mouth every morning. (Patient not  taking: Reported on 07/11/2016), Disp: 90 capsule, Rfl: 0 .  OVER THE COUNTER MEDICATION, Take 1 tablet by mouth daily. TEST-HD TESTOSTERONE SUPPORT, Disp: , Rfl:  .  tiZANidine (ZANAFLEX) 2 MG tablet, Take 1 tablet (2 mg total) by mouth every 8 (eight) hours as needed for muscle spasms. (Patient not taking: Reported on 07/11/2016), Disp: 50 tablet, Rfl: 0  Physical exam:  Vitals:   07/11/16 1413  BP: 105/69  Pulse: 100  Resp: 18  Temp: 97.6 F (36.4 C)  TempSrc: Tympanic  Weight: 248 lb 5 oz (112.6 kg)   Physical Exam  Constitutional: He is oriented to person, place, and time and well-developed, well-nourished, and in no distress.  HENT:  Head: Normocephalic and atraumatic.  Eyes: EOM are normal. Pupils are equal, round, and reactive to light.  Neck: Normal range of motion.  Cardiovascular: Normal rate, regular rhythm and normal heart sounds.   Pulmonary/Chest: Effort normal and breath sounds normal.  Abdominal: Soft. Bowel sounds are normal.  Musculoskeletal:  Recent surgical scar over right knee seen. Healing well  Neurological: He is alert and oriented to person, place, and time.  Skin: Skin is warm and dry.     CMP Latest Ref Rng & Units 07/11/2016  Glucose 65 - 99 mg/dL 112(H)  BUN 6 - 20 mg/dL 56(H)  Creatinine 0.61 - 1.24 mg/dL 2.32(H)  Sodium 135 - 145 mmol/L 137  Potassium 3.5 - 5.1 mmol/L 5.6(H)  Chloride 101 - 111 mmol/L 104  CO2 22 - 32 mmol/L 24  Calcium 8.9 - 10.3 mg/dL 9.9  Total Protein 6.5 - 8.1 g/dL 8.2(H)  Total Bilirubin 0.3 - 1.2 mg/dL 0.4  Alkaline Phos 38 - 126 U/L 128(H)  AST 15 - 41 U/L 35  ALT 17 - 63 U/L 47   CBC Latest Ref Rng & Units 07/11/2016  WBC 3.8 - 10.6 K/uL 8.6  Hemoglobin 13.0 - 18.0 g/dL 10.5(L)  Hematocrit 40.0 - 52.0 % 31.8(L)  Platelets 150 - 440 K/uL 387    No images are attached to the encounter.  US Renal  Result Date: 06/12/2016 CLINICAL DATA:  Acute renal failure . EXAM: RENAL / URINARY TRACT ULTRASOUND COMPLETE  COMPARISON:  CT 10/11/2015. FINDINGS: Right Kidney: Length:  11.7 cm. Echogenicity within normal limits. No mass or hydronephrosis visualized. Left Kidney: Length: 13.2 cm. Echogenicity within normal limits. No mass or hydronephrosis visualized. Bladder: Appears normal for degree of bladder distention. IMPRESSION: Negative exam. No focal abnormality identified. No hydronephrosis or bladder distention. Electronically Signed   By: Marcello Moores  Register   On: 06/12/2016 16:39     Assessment and plan- Patient is a 66 y.o. male with anemia of chronic disease/ chronic kidney disease  Post op his H/H is back down to 10.5 and that is here it has been over last few months. Anemia work up as above was unrevealing other than kidney disease. His kindey functions continue to be abnormal and I have informed Dr. Holley Raring about it. From his anemia standpoint, his anemia could be due to chronic kidney disease since no other etiology apparent. He does not require EPO as long as hb >10. If it is <10 will re initiate EPO that time. RTC in 3 months with cbc ferritin and iron studies   Visit Diagnosis 1. Anemia in chronic kidney disease, unspecified CKD stage      Dr. Randa Evens, MD, MPH [facility] Pager- [phone] 07/11/2016 2:43 PM

## 2016-07-11 NOTE — Telephone Encounter (Signed)
Honor Loh NP called patient and discussed elevated potassium with the patient and how to take the medication. And that we would have him recheck his lab next week.  Voiced understanding.

## 2016-07-11 NOTE — Telephone Encounter (Signed)
PT WIFE SAID THAT THE HEMOGLOBIN DR [facility] SAID THAT HIS CREATINE IS STILL A LITTLE HIGH AND THEY WANTED HIM TO GET THIS CHECKED AGAIN. THE HEMOGLOBIN WAS OK. PT IS ASKING FOR ORDERS TO HAVE THE BLOOD WORK DONE BEFORE HIS APPT ON July 17-18. PLEASE DO THIS ORDER AND WE WILL NOTIFY HIM WHEN IS READY TO BE DONE.

## 2016-07-11 NOTE — Progress Notes (Signed)
Patient had knee surgery on June 15.  Patient offers no complaints today.

## 2016-07-14 DIAGNOSIS — M25661 Stiffness of right knee, not elsewhere classified: Secondary | ICD-10-CM | POA: Diagnosis not present

## 2016-07-14 DIAGNOSIS — M25561 Pain in right knee: Secondary | ICD-10-CM | POA: Diagnosis not present

## 2016-07-14 DIAGNOSIS — Z96651 Presence of right artificial knee joint: Secondary | ICD-10-CM | POA: Diagnosis not present

## 2016-07-15 ENCOUNTER — Inpatient Hospital Stay: Payer: Medicare Other | Admitting: Oncology

## 2016-07-15 DIAGNOSIS — M1990 Unspecified osteoarthritis, unspecified site: Secondary | ICD-10-CM | POA: Diagnosis not present

## 2016-07-15 DIAGNOSIS — R739 Hyperglycemia, unspecified: Secondary | ICD-10-CM

## 2016-07-15 DIAGNOSIS — E896 Postprocedural adrenocortical (-medullary) hypofunction: Secondary | ICD-10-CM | POA: Diagnosis not present

## 2016-07-15 DIAGNOSIS — I129 Hypertensive chronic kidney disease with stage 1 through stage 4 chronic kidney disease, or unspecified chronic kidney disease: Secondary | ICD-10-CM | POA: Diagnosis not present

## 2016-07-15 DIAGNOSIS — N189 Chronic kidney disease, unspecified: Secondary | ICD-10-CM | POA: Diagnosis not present

## 2016-07-15 DIAGNOSIS — D649 Anemia, unspecified: Secondary | ICD-10-CM | POA: Diagnosis not present

## 2016-07-15 DIAGNOSIS — D638 Anemia in other chronic diseases classified elsewhere: Secondary | ICD-10-CM

## 2016-07-15 DIAGNOSIS — J45909 Unspecified asthma, uncomplicated: Secondary | ICD-10-CM | POA: Diagnosis not present

## 2016-07-15 DIAGNOSIS — E875 Hyperkalemia: Secondary | ICD-10-CM

## 2016-07-15 LAB — COMPREHENSIVE METABOLIC PANEL
ALT: 40 U/L (ref 17–63)
AST: 28 U/L (ref 15–41)
Albumin: 3.5 g/dL (ref 3.5–5.0)
Alkaline Phosphatase: 103 U/L (ref 38–126)
Anion gap: 8 (ref 5–15)
BUN: 41 mg/dL — ABNORMAL HIGH (ref 6–20)
CO2: 26 mmol/L (ref 22–32)
Calcium: 9.2 mg/dL (ref 8.9–10.3)
Chloride: 101 mmol/L (ref 101–111)
Creatinine, Ser: 1.63 mg/dL — ABNORMAL HIGH (ref 0.61–1.24)
GFR calc Af Amer: 49 mL/min — ABNORMAL LOW (ref >60)
GFR calc non Af Amer: 43 mL/min — ABNORMAL LOW (ref >60)
Glucose, Bld: 104 mg/dL — ABNORMAL HIGH (ref 65–99)
Potassium: 4.6 mmol/L (ref 3.5–5.1)
Sodium: 135 mmol/L (ref 135–145)
Total Bilirubin: 0.4 mg/dL (ref 0.3–1.2)
Total Protein: 7.4 g/dL (ref 6.5–8.1)

## 2016-07-15 LAB — HEMOGLOBIN: Hemoglobin: 9 g/dL — ABNORMAL LOW (ref 13.0–18.0)

## 2016-07-15 NOTE — Progress Notes (Signed)
Can you please check why patient again have bloodwork today?

## 2016-07-16 DIAGNOSIS — M25561 Pain in right knee: Secondary | ICD-10-CM | POA: Diagnosis not present

## 2016-07-16 DIAGNOSIS — Z96651 Presence of right artificial knee joint: Secondary | ICD-10-CM | POA: Diagnosis not present

## 2016-07-16 DIAGNOSIS — M25661 Stiffness of right knee, not elsewhere classified: Secondary | ICD-10-CM | POA: Diagnosis not present

## 2016-07-17 ENCOUNTER — Telehealth: Payer: Self-pay | Admitting: *Deleted

## 2016-07-17 NOTE — Telephone Encounter (Signed)
Wife states that they were supposed to get a call from our office Tuesday regarding his lab results. Please advise  Dx:  Hyperglycemia; Hyperkalemia   Ref Range & Units 2d ago  Sodium 135 - 145 mmol/L 135   Potassium 3.5 - 5.1 mmol/L 4.6   Chloride 101 - 111 mmol/L 101   CO2 22 - 32 mmol/L 26   Glucose, Bld 65 - 99 mg/dL 104    BUN 6 - 20 mg/dL 41    Creatinine, Ser 0.61 - 1.24 mg/dL 1.63    Calcium 8.9 - 10.3 mg/dL 9.2   Total Protein 6.5 - 8.1 g/dL 7.4   Albumin 3.5 - 5.0 g/dL 3.5   AST 15 - 41 U/L 28   ALT 17 - 63 U/L 40   Alkaline Phosphatase 38 - 126 U/L 103   Total Bilirubin 0.3 - 1.2 mg/dL 0.4   GFR calc non Af Amer >60 mL/min 43    GFR calc Af Amer >60 mL/min 49    Comment: (NOTE)  The eGFR has been calculated using the CKD EPI equation.  This calculation has not been validated in all clinical situations.  eGFR's persistently <60 mL/min signify possible Chronic Kidney  Disease.   Anion gap 5 - 15 8   Resulting Agency  [facility]    Specimen Collected: 07/15/16 14:22  Last Resulted: 07/15/16 14:40                       

## 2016-07-17 NOTE — Telephone Encounter (Signed)
Call returned to patient with results of potassium

## 2016-07-17 NOTE — Telephone Encounter (Signed)
Please let him know his potasssium results

## 2016-07-18 DIAGNOSIS — M25561 Pain in right knee: Secondary | ICD-10-CM | POA: Diagnosis not present

## 2016-07-18 DIAGNOSIS — M25661 Stiffness of right knee, not elsewhere classified: Secondary | ICD-10-CM | POA: Diagnosis not present

## 2016-07-18 DIAGNOSIS — Z96651 Presence of right artificial knee joint: Secondary | ICD-10-CM | POA: Diagnosis not present

## 2016-07-21 DIAGNOSIS — M25561 Pain in right knee: Secondary | ICD-10-CM | POA: Diagnosis not present

## 2016-07-21 DIAGNOSIS — Z96651 Presence of right artificial knee joint: Secondary | ICD-10-CM | POA: Diagnosis not present

## 2016-07-21 DIAGNOSIS — M25661 Stiffness of right knee, not elsewhere classified: Secondary | ICD-10-CM | POA: Diagnosis not present

## 2016-07-22 ENCOUNTER — Ambulatory Visit: Payer: Medicare Other | Admitting: Family Medicine

## 2016-07-22 DIAGNOSIS — N179 Acute kidney failure, unspecified: Secondary | ICD-10-CM | POA: Diagnosis not present

## 2016-07-22 DIAGNOSIS — E875 Hyperkalemia: Secondary | ICD-10-CM | POA: Diagnosis not present

## 2016-07-23 DIAGNOSIS — M25561 Pain in right knee: Secondary | ICD-10-CM | POA: Diagnosis not present

## 2016-07-23 DIAGNOSIS — Z96651 Presence of right artificial knee joint: Secondary | ICD-10-CM | POA: Diagnosis not present

## 2016-07-23 DIAGNOSIS — M25661 Stiffness of right knee, not elsewhere classified: Secondary | ICD-10-CM | POA: Diagnosis not present

## 2016-07-25 DIAGNOSIS — Z96651 Presence of right artificial knee joint: Secondary | ICD-10-CM | POA: Diagnosis not present

## 2016-07-25 DIAGNOSIS — M25661 Stiffness of right knee, not elsewhere classified: Secondary | ICD-10-CM | POA: Diagnosis not present

## 2016-07-25 DIAGNOSIS — M25561 Pain in right knee: Secondary | ICD-10-CM | POA: Diagnosis not present

## 2016-07-29 DIAGNOSIS — M25561 Pain in right knee: Secondary | ICD-10-CM | POA: Diagnosis not present

## 2016-07-30 DIAGNOSIS — Z96651 Presence of right artificial knee joint: Secondary | ICD-10-CM | POA: Diagnosis not present

## 2016-07-30 DIAGNOSIS — M25561 Pain in right knee: Secondary | ICD-10-CM | POA: Diagnosis not present

## 2016-07-30 DIAGNOSIS — M25661 Stiffness of right knee, not elsewhere classified: Secondary | ICD-10-CM | POA: Diagnosis not present

## 2016-08-01 DIAGNOSIS — M25561 Pain in right knee: Secondary | ICD-10-CM | POA: Diagnosis not present

## 2016-08-01 DIAGNOSIS — Z96651 Presence of right artificial knee joint: Secondary | ICD-10-CM | POA: Diagnosis not present

## 2016-08-01 DIAGNOSIS — M25661 Stiffness of right knee, not elsewhere classified: Secondary | ICD-10-CM | POA: Diagnosis not present

## 2016-08-05 DIAGNOSIS — I10 Essential (primary) hypertension: Secondary | ICD-10-CM | POA: Diagnosis not present

## 2016-08-05 DIAGNOSIS — E876 Hypokalemia: Secondary | ICD-10-CM | POA: Diagnosis not present

## 2016-08-07 DIAGNOSIS — M25561 Pain in right knee: Secondary | ICD-10-CM | POA: Diagnosis not present

## 2016-08-07 DIAGNOSIS — Z96651 Presence of right artificial knee joint: Secondary | ICD-10-CM | POA: Diagnosis not present

## 2016-08-07 DIAGNOSIS — M25661 Stiffness of right knee, not elsewhere classified: Secondary | ICD-10-CM | POA: Diagnosis not present

## 2016-08-11 ENCOUNTER — Other Ambulatory Visit: Payer: Self-pay | Admitting: Family Medicine

## 2016-08-11 DIAGNOSIS — J302 Other seasonal allergic rhinitis: Secondary | ICD-10-CM

## 2016-08-11 NOTE — Telephone Encounter (Signed)
Patient requesting refill of Azelastine to [facility].

## 2016-08-13 DIAGNOSIS — Z96651 Presence of right artificial knee joint: Secondary | ICD-10-CM | POA: Diagnosis not present

## 2016-08-13 DIAGNOSIS — M25661 Stiffness of right knee, not elsewhere classified: Secondary | ICD-10-CM | POA: Diagnosis not present

## 2016-08-13 DIAGNOSIS — M25561 Pain in right knee: Secondary | ICD-10-CM | POA: Diagnosis not present

## 2016-08-18 DIAGNOSIS — M25561 Pain in right knee: Secondary | ICD-10-CM | POA: Diagnosis not present

## 2016-08-18 DIAGNOSIS — M25661 Stiffness of right knee, not elsewhere classified: Secondary | ICD-10-CM | POA: Diagnosis not present

## 2016-08-18 DIAGNOSIS — Z96651 Presence of right artificial knee joint: Secondary | ICD-10-CM | POA: Diagnosis not present

## 2016-08-20 DIAGNOSIS — M25561 Pain in right knee: Secondary | ICD-10-CM | POA: Diagnosis not present

## 2016-08-20 DIAGNOSIS — Z96651 Presence of right artificial knee joint: Secondary | ICD-10-CM | POA: Diagnosis not present

## 2016-08-20 DIAGNOSIS — M25661 Stiffness of right knee, not elsewhere classified: Secondary | ICD-10-CM | POA: Diagnosis not present

## 2016-08-25 ENCOUNTER — Telehealth: Payer: Self-pay

## 2016-08-25 ENCOUNTER — Other Ambulatory Visit: Payer: Self-pay | Admitting: Family Medicine

## 2016-08-25 DIAGNOSIS — Z96651 Presence of right artificial knee joint: Secondary | ICD-10-CM | POA: Diagnosis not present

## 2016-08-25 DIAGNOSIS — M25661 Stiffness of right knee, not elsewhere classified: Secondary | ICD-10-CM | POA: Diagnosis not present

## 2016-08-25 DIAGNOSIS — M25561 Pain in right knee: Secondary | ICD-10-CM | POA: Diagnosis not present

## 2016-08-25 MED ORDER — LOSARTAN POTASSIUM 50 MG PO TABS: 50.0000 mg | ORAL_TABLET | Freq: Every day | ORAL | 1 refills | Status: DC

## 2016-08-25 NOTE — Telephone Encounter (Signed)
We got a fax from [facility] stating that this patient's medication has been recalled. Patient wants a new rx to be sent to his preferred pharmacy.

## 2016-08-27 DIAGNOSIS — Z96651 Presence of right artificial knee joint: Secondary | ICD-10-CM | POA: Diagnosis not present

## 2016-08-27 DIAGNOSIS — M25661 Stiffness of right knee, not elsewhere classified: Secondary | ICD-10-CM | POA: Diagnosis not present

## 2016-08-27 DIAGNOSIS — M25561 Pain in right knee: Secondary | ICD-10-CM | POA: Diagnosis not present

## 2016-08-27 NOTE — Telephone Encounter (Signed)
error 

## 2016-08-28 DIAGNOSIS — M25561 Pain in right knee: Secondary | ICD-10-CM | POA: Diagnosis not present

## 2016-09-03 DIAGNOSIS — Z96651 Presence of right artificial knee joint: Secondary | ICD-10-CM | POA: Diagnosis not present

## 2016-09-03 DIAGNOSIS — M25661 Stiffness of right knee, not elsewhere classified: Secondary | ICD-10-CM | POA: Diagnosis not present

## 2016-09-03 DIAGNOSIS — M25561 Pain in right knee: Secondary | ICD-10-CM | POA: Diagnosis not present

## 2016-09-10 DIAGNOSIS — M25561 Pain in right knee: Secondary | ICD-10-CM | POA: Diagnosis not present

## 2016-09-10 DIAGNOSIS — Z96651 Presence of right artificial knee joint: Secondary | ICD-10-CM | POA: Diagnosis not present

## 2016-09-10 DIAGNOSIS — M25661 Stiffness of right knee, not elsewhere classified: Secondary | ICD-10-CM | POA: Diagnosis not present

## 2016-09-11 DIAGNOSIS — Z96651 Presence of right artificial knee joint: Secondary | ICD-10-CM | POA: Diagnosis not present

## 2016-09-11 DIAGNOSIS — M25661 Stiffness of right knee, not elsewhere classified: Secondary | ICD-10-CM | POA: Diagnosis not present

## 2016-09-11 DIAGNOSIS — M25561 Pain in right knee: Secondary | ICD-10-CM | POA: Diagnosis not present

## 2016-09-18 DIAGNOSIS — M25661 Stiffness of right knee, not elsewhere classified: Secondary | ICD-10-CM | POA: Diagnosis not present

## 2016-09-18 DIAGNOSIS — Z96651 Presence of right artificial knee joint: Secondary | ICD-10-CM | POA: Diagnosis not present

## 2016-09-18 DIAGNOSIS — M25561 Pain in right knee: Secondary | ICD-10-CM | POA: Diagnosis not present

## 2016-09-22 ENCOUNTER — Other Ambulatory Visit: Payer: Self-pay | Admitting: Family Medicine

## 2016-09-22 DIAGNOSIS — J302 Other seasonal allergic rhinitis: Secondary | ICD-10-CM

## 2016-09-22 NOTE — Telephone Encounter (Signed)
Patient requesting refill of Flonase to [facility]. 

## 2016-09-24 DIAGNOSIS — M25661 Stiffness of right knee, not elsewhere classified: Secondary | ICD-10-CM | POA: Diagnosis not present

## 2016-09-24 DIAGNOSIS — Z96651 Presence of right artificial knee joint: Secondary | ICD-10-CM | POA: Diagnosis not present

## 2016-09-24 DIAGNOSIS — M25561 Pain in right knee: Secondary | ICD-10-CM | POA: Diagnosis not present

## 2016-09-25 DIAGNOSIS — M25561 Pain in right knee: Secondary | ICD-10-CM | POA: Diagnosis not present

## 2016-09-25 DIAGNOSIS — M25661 Stiffness of right knee, not elsewhere classified: Secondary | ICD-10-CM | POA: Diagnosis not present

## 2016-09-25 DIAGNOSIS — Z96651 Presence of right artificial knee joint: Secondary | ICD-10-CM | POA: Diagnosis not present

## 2016-09-30 DIAGNOSIS — Z96651 Presence of right artificial knee joint: Secondary | ICD-10-CM | POA: Diagnosis not present

## 2016-09-30 DIAGNOSIS — M25661 Stiffness of right knee, not elsewhere classified: Secondary | ICD-10-CM | POA: Diagnosis not present

## 2016-09-30 DIAGNOSIS — M25561 Pain in right knee: Secondary | ICD-10-CM | POA: Diagnosis not present

## 2016-10-01 DIAGNOSIS — D631 Anemia in chronic kidney disease: Secondary | ICD-10-CM | POA: Diagnosis not present

## 2016-10-01 DIAGNOSIS — N183 Chronic kidney disease, stage 3 (moderate): Secondary | ICD-10-CM | POA: Diagnosis not present

## 2016-10-01 DIAGNOSIS — I10 Essential (primary) hypertension: Secondary | ICD-10-CM | POA: Diagnosis not present

## 2016-10-01 DIAGNOSIS — M25561 Pain in right knee: Secondary | ICD-10-CM | POA: Diagnosis not present

## 2016-10-02 DIAGNOSIS — M25561 Pain in right knee: Secondary | ICD-10-CM | POA: Diagnosis not present

## 2016-10-02 DIAGNOSIS — Z96651 Presence of right artificial knee joint: Secondary | ICD-10-CM | POA: Diagnosis not present

## 2016-10-02 DIAGNOSIS — M25661 Stiffness of right knee, not elsewhere classified: Secondary | ICD-10-CM | POA: Diagnosis not present

## 2016-10-06 ENCOUNTER — Other Ambulatory Visit: Payer: Self-pay

## 2016-10-06 DIAGNOSIS — N401 Enlarged prostate with lower urinary tract symptoms: Principal | ICD-10-CM

## 2016-10-06 DIAGNOSIS — N138 Other obstructive and reflux uropathy: Secondary | ICD-10-CM

## 2016-10-06 NOTE — Telephone Encounter (Signed)
Patient requesting refill of Tamsulosin to [facility] with a 90 day supply.

## 2016-10-07 ENCOUNTER — Other Ambulatory Visit: Payer: Self-pay | Admitting: Family Medicine

## 2016-10-07 DIAGNOSIS — N138 Other obstructive and reflux uropathy: Secondary | ICD-10-CM

## 2016-10-07 DIAGNOSIS — N401 Enlarged prostate with lower urinary tract symptoms: Principal | ICD-10-CM

## 2016-10-07 MED ORDER — TAMSULOSIN HCL 0.4 MG PO CAPS: 0.4000 mg | ORAL_CAPSULE | Freq: Every evening | ORAL | 0 refills | Status: DC

## 2016-10-07 NOTE — Telephone Encounter (Signed)
Patient requesting refill of Cialis to [facility].

## 2016-10-09 ENCOUNTER — Inpatient Hospital Stay: Payer: Medicare Other | Admitting: Oncology

## 2016-10-09 ENCOUNTER — Inpatient Hospital Stay: Payer: Medicare Other

## 2016-10-13 ENCOUNTER — Inpatient Hospital Stay: Payer: Medicare Other

## 2016-10-13 ENCOUNTER — Encounter: Payer: Self-pay | Admitting: Oncology

## 2016-10-13 ENCOUNTER — Inpatient Hospital Stay (HOSPITAL_BASED_OUTPATIENT_CLINIC_OR_DEPARTMENT_OTHER): Payer: Medicare Other | Admitting: Oncology

## 2016-10-13 ENCOUNTER — Inpatient Hospital Stay: Payer: Medicare Other | Attending: Oncology

## 2016-10-13 VITALS — BP 128/86 | HR 80 | Temp 96.8°F | Resp 14 | Wt 250.7 lb

## 2016-10-13 DIAGNOSIS — N400 Enlarged prostate without lower urinary tract symptoms: Secondary | ICD-10-CM | POA: Diagnosis not present

## 2016-10-13 DIAGNOSIS — I129 Hypertensive chronic kidney disease with stage 1 through stage 4 chronic kidney disease, or unspecified chronic kidney disease: Secondary | ICD-10-CM | POA: Insufficient documentation

## 2016-10-13 DIAGNOSIS — Z23 Encounter for immunization: Secondary | ICD-10-CM | POA: Insufficient documentation

## 2016-10-13 DIAGNOSIS — D631 Anemia in chronic kidney disease: Secondary | ICD-10-CM | POA: Diagnosis not present

## 2016-10-13 DIAGNOSIS — Z79899 Other long term (current) drug therapy: Secondary | ICD-10-CM | POA: Diagnosis not present

## 2016-10-13 DIAGNOSIS — Z7982 Long term (current) use of aspirin: Secondary | ICD-10-CM | POA: Insufficient documentation

## 2016-10-13 DIAGNOSIS — N189 Chronic kidney disease, unspecified: Secondary | ICD-10-CM | POA: Diagnosis not present

## 2016-10-13 DIAGNOSIS — M1990 Unspecified osteoarthritis, unspecified site: Secondary | ICD-10-CM | POA: Diagnosis not present

## 2016-10-13 DIAGNOSIS — K219 Gastro-esophageal reflux disease without esophagitis: Secondary | ICD-10-CM | POA: Insufficient documentation

## 2016-10-13 DIAGNOSIS — E876 Hypokalemia: Secondary | ICD-10-CM

## 2016-10-13 DIAGNOSIS — K589 Irritable bowel syndrome without diarrhea: Secondary | ICD-10-CM | POA: Diagnosis not present

## 2016-10-13 DIAGNOSIS — D638 Anemia in other chronic diseases classified elsewhere: Secondary | ICD-10-CM

## 2016-10-13 DIAGNOSIS — J45909 Unspecified asthma, uncomplicated: Secondary | ICD-10-CM | POA: Diagnosis not present

## 2016-10-13 DIAGNOSIS — Z87891 Personal history of nicotine dependence: Secondary | ICD-10-CM

## 2016-10-13 LAB — COMPREHENSIVE METABOLIC PANEL
ALT: 18 U/L (ref 17–63)
AST: 21 U/L (ref 15–41)
Albumin: 4.3 g/dL (ref 3.5–5.0)
Alkaline Phosphatase: 92 U/L (ref 38–126)
Anion gap: 7 (ref 5–15)
BILIRUBIN TOTAL: 0.2 mg/dL — AB (ref 0.3–1.2)
BUN: 35 mg/dL — AB (ref 6–20)
CHLORIDE: 100 mmol/L — AB (ref 101–111)
CO2: 27 mmol/L (ref 22–32)
CREATININE: 1.64 mg/dL — AB (ref 0.61–1.24)
Calcium: 9.5 mg/dL (ref 8.9–10.3)
GFR calc Af Amer: 49 mL/min — ABNORMAL LOW (ref >60)
GFR, EST NON AFRICAN AMERICAN: 42 mL/min — AB (ref >60)
GLUCOSE: 97 mg/dL (ref 65–99)
POTASSIUM: 4.5 mmol/L (ref 3.5–5.1)
Sodium: 134 mmol/L — ABNORMAL LOW (ref 135–145)
Total Protein: 8.2 g/dL — ABNORMAL HIGH (ref 6.5–8.1)

## 2016-10-13 LAB — IRON AND TIBC
IRON: 24 ug/dL — AB (ref 45–182)
Saturation Ratios: 8 % — ABNORMAL LOW (ref 17.9–39.5)
TIBC: 299 ug/dL (ref 250–450)
UIBC: 275 ug/dL

## 2016-10-13 LAB — CBC WITH DIFFERENTIAL/PLATELET
BASOS ABS: 0.1 10*3/uL (ref 0–0.1)
Basophils Relative: 1 %
Eosinophils Absolute: 0.1 10*3/uL (ref 0–0.7)
Eosinophils Relative: 2 %
HEMATOCRIT: 34.4 % — AB (ref 40.0–52.0)
Hemoglobin: 11.1 g/dL — ABNORMAL LOW (ref 13.0–18.0)
LYMPHS ABS: 1.8 10*3/uL (ref 1.0–3.6)
LYMPHS PCT: 25 %
MCH: 26.3 pg (ref 26.0–34.0)
MCHC: 32.2 g/dL (ref 32.0–36.0)
MCV: 81.7 fL (ref 80.0–100.0)
MONO ABS: 0.7 10*3/uL (ref 0.2–1.0)
MONOS PCT: 9 %
NEUTROS ABS: 4.5 10*3/uL (ref 1.4–6.5)
Neutrophils Relative %: 63 %
Platelets: 259 10*3/uL (ref 150–440)
RBC: 4.21 MIL/uL — ABNORMAL LOW (ref 4.40–5.90)
RDW: 17.9 % — AB (ref 11.5–14.5)
WBC: 7.3 10*3/uL (ref 3.8–10.6)

## 2016-10-13 LAB — FERRITIN: Ferritin: 228 ng/mL (ref 24–336)

## 2016-10-13 MED ORDER — INFLUENZA VAC SPLIT QUAD 0.5 ML IM SUSY: 0.5000 mL | PREFILLED_SYRINGE | Freq: Once | INTRAMUSCULAR | Status: DC

## 2016-10-13 MED ORDER — INFLUENZA VAC SPLIT QUAD 0.5 ML IM SUSY
0.5000 mL | PREFILLED_SYRINGE | Freq: Once | INTRAMUSCULAR | Status: AC
  Administered 2016-10-13: 0.5 mL via INTRAMUSCULAR

## 2016-10-13 NOTE — Progress Notes (Signed)
Patient states that he has no pain today. He would like to get a flu shot today if possible.

## 2016-10-13 NOTE — Progress Notes (Signed)
Hematology/Oncology Consult note [facility]  Telephone:([phone] Fax:([phone]  Patient Care Team: Steele Sizer, MD as PCP - General (Family Medicine)   Name of the patient: [name]  [id number]  1950/03/08   Date of visit: 10/13/16  Diagnosis- anemia probably secondary to chronic disease/chronic kidney disease  Chief complaint/ Reason for visit- routine f/u of anemia  Heme/Onc history: Patient is a 66 yr old male who is scheduled to undergo right total arthroplasty next month. He has been referred to [place] for his anemia prior to upcoming surgery. Recent cbc from 03/11/16 showed wbc of 6.5, H/H of 10.3/31.6 and platelet count of 210. TSH was normal at 1.63. LFTs were normal. Iron studies showed normal TIBC of 256, normal iron of 50 and iron saturation of 20%. Ferritin was levetaed at 631. 10 months prior to that ferritin was normal. 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 Other PMH includes HTN, OA, asthma.   Results of bloodwork from 05/05/2016 were as follows: CBC showed white count of 7.3, H&H of 10.1/30.4 and a platelet count of 234. CMP was normal except for elevated BUN of 50 and creatinine of 1.97. Ferritin was elevated at 430. Iron studies showed iron saturation of 20%. Coombs test was negative, folate level was normal and B12 level was elevated at 1170. ESR was elevated at 46. Myeloma panel did not reveal any monoclonal protein. epo level was 13.7  Patient received40000weekly X 4 doses Day -21, Day-14 Day -7 and on the day of knee surgery. His hb went up to 12.9. He did not require blood transfusion post surgery  Patient sees nephrology for his CKD as well. He has not required EPO for anemia of CKD so far  Interval history- he continues to see dr. Holley Raring for hsi CKD which is slowly improving but not normal. He has stopped using nSAIDS. He may have left knee surgery coming up in  december  ECOG PS- 0 Pain scale- 0   Review of systems- Review of Systems  Constitutional: Negative for chills, fever, malaise/fatigue and weight loss.  HENT: Negative for congestion, ear discharge and nosebleeds.   Eyes: Negative for blurred vision.  Respiratory: Negative for cough, hemoptysis, sputum production, shortness of breath and wheezing.   Cardiovascular: Negative for chest pain, palpitations, orthopnea and claudication.  Gastrointestinal: Negative for abdominal pain, blood in stool, constipation, diarrhea, heartburn, melena, nausea and vomiting.  Genitourinary: Negative for dysuria, flank pain, frequency, hematuria and urgency.  Musculoskeletal: Negative for back pain, joint pain and myalgias.  Skin: Negative for rash.  Neurological: Negative for dizziness, tingling, focal weakness, seizures, weakness and headaches.  Endo/Heme/Allergies: Does not bruise/bleed easily.  Psychiatric/Behavioral: Negative for depression and suicidal ideas. The patient does not have insomnia.       Allergies  Allergen Reactions  . Lactose Intolerance (Gi) Other (See Comments)    MIGRAINES     Past Medical History:  Diagnosis Date  . Allergic rhinitis   . Anemia   . BPH (benign prostatic hyperplasia)   . Chronic sinusitis   . Complication of anesthesia    work up during surgery 2x in the past   . Decreased libido   . ED (erectile dysfunction)   . Fatigue   . Hematuria   . HTN (hypertension)   . Hypogonadism in male   . Hypokalemia    history of   . IBS (irritable bowel syndrome)   . Low serum vitamin D   .  Lumbago   . Migraine   . Mild intermittent asthma   . Reflux   . Shingles   . Unilateral inguinal hernia without obstruction or gangrene      Past Surgical History:  Procedure Laterality Date  . ADRENALECTOMY Left 02/11/2016   [facility]  . COLONOSCOPY  02/2012   normal  . JOINT REPLACEMENT    . KNEE ARTHROSCOPY Left 10/06/2009  . SINUS EXPLORATION    . TOTAL KNEE  ARTHROPLASTY Right 06/20/2016  . TOTAL KNEE ARTHROPLASTY Right 06/20/2016   Procedure: TOTAL KNEE ARTHROPLASTY;  Surgeon: Dorna Leitz, MD;  Location: [place];  Service: Orthopedics;  Laterality: Right;    Social History   Social History  . Marital status: Married    Spouse name: N/A  . Number of children: N/A  . Years of education: N/A   Occupational History  . Not on file.   Social History Main Topics  . Smoking status: Former Smoker    Packs/day: 1.00    Years: 10.00  . Smokeless tobacco: Never Used     Comment: 38 years ago 63 when he stopped  . Alcohol use No  . Drug use: No  . Sexual activity: Yes    Partners: Female   Other Topics Concern  . Not on file   Social History Narrative  . No narrative on file    Family History  Problem Relation Age of Onset  . Diabetes Mother   . Heart disease Mother   . Lung disease Mother   . Seizures Maternal Grandmother      Current Outpatient Prescriptions:  .  albuterol (PROAIR HFA) 108 (90 Base) MCG/ACT inhaler, Inhale 2 puffs into the lungs every 6 (six) hours as needed for wheezing or shortness of breath., Disp: 1 Inhaler, Rfl: 0 .  AMINO ACIDS PO, Take 1-2 tablets by mouth 2 (two) times daily. Take 2 tablets with lunch & 1 tablet in the evening. Essential Amino Energy, Disp: , Rfl:  .  aspirin EC 325 MG tablet, Take 1 tablet (325 mg total) by mouth 2 (two) times daily after a meal. Take x 1 month post op to decrease risk of blood clots., Disp: 60 tablet, Rfl: 0 .  azelastine (OPTIVAR) 0.05 % ophthalmic solution, INSTILL 2 DROPS INTO BOTH EYES TWICE A DAY, Disp: 6 mL, Rfl: 5 .  CIALIS 5 MG tablet, TAKE 1 TABLET (5 MG TOTAL) BY MOUTH DAILY., Disp: 30 tablet, Rfl: 0 .  docusate sodium (COLACE) 100 MG capsule, Take 1 capsule (100 mg total) by mouth 2 (two) times daily., Disp: 30 capsule, Rfl: 0 .  fexofenadine (ALLEGRA) 180 MG tablet, Take 180 mg by mouth daily as needed for allergies or rhinitis., Disp: , Rfl:  .   fexofenadine-pseudoephedrine (ALLEGRA-D 24) 180-240 MG 24 hr tablet, Take 1 tablet by mouth daily as needed (for allergies.)., Disp: , Rfl:  .  fluticasone (FLONASE) 50 MCG/ACT nasal spray, USE 2 SPRAYS IN EACH NOSTRIL EVERY DAY, Disp: 48 g, Rfl: 1 .  fluticasone furoate-vilanterol (BREO ELLIPTA) 200-25 MCG/INH AEPB, Inhale 1 puff into the lungs daily., Disp: 60 each, Rfl: 5 .  Glucosamine-MSM-Hyaluronic Acd (JOINT HEALTH PO), Take 1 tablet by mouth daily. INSTAFLEX ADVANCED JOINT SUPPORT, Disp: , Rfl:  .  HYDROmorphone (DILAUDID) 2 MG tablet, Take 2 mg by mouth as needed., Disp: , Rfl: 0 .  losartan (COZAAR) 50 MG tablet, Take 1 tablet (50 mg total) by mouth daily. In place of Valsartan, Disp: 90 tablet, Rfl: 1 .  Misc Natural Products (NF FORMULAS TESTOSTERONE PO), Take 2 tablets by mouth daily. NATURE'S PLUS T-MALE SUPPLEMENT, Disp: , Rfl:  .  montelukast (SINGULAIR) 10 MG tablet, TAKE 1 TABLET (10 MG TOTAL) BY MOUTH DAILY., Disp: 30 tablet, Rfl: 5 .  Multiple Vitamins-Minerals (MULTIVITAMIN ADULTS 50+ PO), Take 1 Package by mouth daily. NATURE'S CODE MEN OVER 50 MULTIVITAMIN PACK, Disp: , Rfl:  .  mupirocin ointment (BACTROBAN) 2 %, APPLY IN EACH NOSTRIL TWICE A DAY FOR 5 DAYS, Disp: , Rfl: 0 .  NUTRITIONAL SUPPLEMENT LIQD, Take 1 scoop by mouth daily as needed (for energy support). [employer] 45X Energizing Berry., Disp: , Rfl:  .  Nutritional Supplements (NUTRITIONAL SUPPLEMENT PO), Take 1 Bottle by mouth daily. Next Step Fit N Full Shake, Disp: , Rfl:  .  omeprazole (PRILOSEC) 40 MG capsule, Take 1 capsule (40 mg total) by mouth every morning. (Patient not taking: Reported on 07/11/2016), Disp: 90 capsule, Rfl: 0 .  OVER THE COUNTER MEDICATION, Take 1 tablet by mouth daily. TEST-HD TESTOSTERONE SUPPORT, Disp: , Rfl:  .  tamsulosin (FLOMAX) 0.4 MG CAPS capsule, Take 1 capsule (0.4 mg total) by mouth every evening., Disp: 90 capsule, Rfl: 0 .  tiZANidine (ZANAFLEX) 2 MG tablet, Take 1  tablet (2 mg total) by mouth every 8 (eight) hours as needed for muscle spasms. (Patient not taking: Reported on 07/11/2016), Disp: 50 tablet, Rfl: 0 .  traMADol (ULTRAM) 50 MG tablet, Take 1-2 tablets (50-100 mg total) by mouth every 6 (six) hours as needed (for pain)., Disp: 60 tablet, Rfl: 0  Physical exam:  Vitals:   10/13/16 1047  BP: 128/86  Pulse: 80  Resp: 14  Temp: (!) 96.8 F (36 C)  TempSrc: Tympanic  Weight: 250 lb 10.6 oz (113.7 kg)   Physical Exam  Constitutional: He is oriented to person, place, and time and well-developed, well-nourished, and in no distress.  HENT:  Head: Normocephalic and atraumatic.  Eyes: Pupils are equal, round, and reactive to light. EOM are normal.  Neck: Normal range of motion.  Cardiovascular: Normal rate, regular rhythm and normal heart sounds.   Pulmonary/Chest: Effort normal and breath sounds normal.  Abdominal: Soft. Bowel sounds are normal.  Neurological: He is alert and oriented to person, place, and time.  Skin: Skin is warm and dry.     CMP Latest Ref Rng & Units 10/13/2016  Glucose 65 - 99 mg/dL 97  BUN 6 - 20 mg/dL 35(H)  Creatinine 0.61 - 1.24 mg/dL 1.64(H)  Sodium 135 - 145 mmol/L 134(L)  Potassium 3.5 - 5.1 mmol/L 4.5  Chloride 101 - 111 mmol/L 100(L)  CO2 22 - 32 mmol/L 27  Calcium 8.9 - 10.3 mg/dL 9.5  Total Protein 6.5 - 8.1 g/dL 8.2(H)  Total Bilirubin 0.3 - 1.2 mg/dL 0.2(L)  Alkaline Phos 38 - 126 U/L 92  AST 15 - 41 U/L 21  ALT 17 - 63 U/L 18   CBC Latest Ref Rng & Units 10/13/2016  WBC 3.8 - 10.6 K/uL 7.3  Hemoglobin 13.0 - 18.0 g/dL 11.1(L)  Hematocrit 40.0 - 52.0 % 34.4(L)  Platelets 150 - 440 K/uL 259      Assessment and plan- Patient is a 66 y.o. male with anemia of chronic kidney disease   Today his hb is significantly improved to 11.1. Anemia work up unrevealing as above. Continue to monitor. Will consider EPO if CKD persists and hb <10. Repeat cbc in 3 months and I will see him in 6  months with cbc  ferritin and iron studies and cmp  He may have another left knee surgery in December. He did receive EPO pre op to improve his anemia prior to 1st knee surgery. Since he now has CKD, I will not give him pre op EPO unless his CKD resolves  Flu shot today   Visit Diagnosis 1. Anemia of chronic kidney failure, unspecified stage   2. Anemia of chronic disease      Dr. Randa Evens, MD, MPH [facility] at [facility] Pager- [phone] 10/13/2016 10:50 AM

## 2016-10-14 ENCOUNTER — Telehealth: Payer: Self-pay | Admitting: *Deleted

## 2016-10-14 NOTE — Telephone Encounter (Signed)
Called pt and let him know that Dr. Janese Banks said his iron level was low and she rec: to start taking ferrous sulfate 325 mg over the counter and take 1 a day for a week and if it is ok with his stomach he can increase to 1 tablet bid with food. Patient will pick it up and start it soon

## 2016-10-14 NOTE — Telephone Encounter (Signed)
-----   Message from Sindy Guadeloupe, MD sent at 10/13/2016  4:45 PM EDT ----- Please let patient know- iron saturation and serum iron is low. He should start taking PO iron 1 to 2 times a day

## 2016-10-25 ENCOUNTER — Encounter: Payer: Self-pay | Admitting: Oncology

## 2016-10-30 DIAGNOSIS — M25562 Pain in left knee: Secondary | ICD-10-CM | POA: Diagnosis not present

## 2016-11-11 ENCOUNTER — Other Ambulatory Visit: Payer: Self-pay | Admitting: Family Medicine

## 2016-11-12 ENCOUNTER — Other Ambulatory Visit: Payer: Self-pay | Admitting: Orthopedic Surgery

## 2016-11-12 NOTE — Telephone Encounter (Signed)
Refill request for general medication: Singulair  Last office visit:  06/11/2016  Last physical exam: None indicated  Follow up Scheduled: None indicated

## 2016-11-14 ENCOUNTER — Other Ambulatory Visit: Payer: Self-pay | Admitting: *Deleted

## 2016-11-16 ENCOUNTER — Other Ambulatory Visit: Payer: Self-pay | Admitting: Family Medicine

## 2016-11-16 DIAGNOSIS — N138 Other obstructive and reflux uropathy: Secondary | ICD-10-CM

## 2016-11-16 DIAGNOSIS — N401 Enlarged prostate with lower urinary tract symptoms: Principal | ICD-10-CM

## 2016-11-18 ENCOUNTER — Other Ambulatory Visit: Payer: Self-pay | Admitting: *Deleted

## 2016-11-18 DIAGNOSIS — D649 Anemia, unspecified: Secondary | ICD-10-CM

## 2016-11-25 ENCOUNTER — Ambulatory Visit: Payer: Medicare Other | Admitting: Family Medicine

## 2016-11-25 ENCOUNTER — Other Ambulatory Visit: Payer: Self-pay

## 2016-11-25 DIAGNOSIS — J302 Other seasonal allergic rhinitis: Secondary | ICD-10-CM

## 2016-11-25 NOTE — Telephone Encounter (Signed)
Refill request for general medication: Flonase  Last office visit: 06/11/2016  Next visit: 11/26/2016.

## 2016-11-26 ENCOUNTER — Encounter: Payer: Self-pay | Admitting: Family Medicine

## 2016-11-26 ENCOUNTER — Ambulatory Visit (INDEPENDENT_AMBULATORY_CARE_PROVIDER_SITE_OTHER): Payer: Medicare Other | Admitting: Family Medicine

## 2016-11-26 VITALS — BP 112/74 | HR 98 | Temp 97.7°F | Resp 18 | Ht 74.0 in | Wt 258.6 lb

## 2016-11-26 DIAGNOSIS — D638 Anemia in other chronic diseases classified elsewhere: Secondary | ICD-10-CM

## 2016-11-26 DIAGNOSIS — I10 Essential (primary) hypertension: Secondary | ICD-10-CM

## 2016-11-26 DIAGNOSIS — J3089 Other allergic rhinitis: Secondary | ICD-10-CM | POA: Diagnosis not present

## 2016-11-26 DIAGNOSIS — J4540 Moderate persistent asthma, uncomplicated: Secondary | ICD-10-CM

## 2016-11-26 DIAGNOSIS — N183 Chronic kidney disease, stage 3 unspecified: Secondary | ICD-10-CM

## 2016-11-26 DIAGNOSIS — N138 Other obstructive and reflux uropathy: Secondary | ICD-10-CM | POA: Diagnosis not present

## 2016-11-26 DIAGNOSIS — N401 Enlarged prostate with lower urinary tract symptoms: Secondary | ICD-10-CM

## 2016-11-26 DIAGNOSIS — M170 Bilateral primary osteoarthritis of knee: Secondary | ICD-10-CM | POA: Diagnosis not present

## 2016-11-26 DIAGNOSIS — Z01818 Encounter for other preprocedural examination: Secondary | ICD-10-CM | POA: Diagnosis not present

## 2016-11-26 DIAGNOSIS — J302 Other seasonal allergic rhinitis: Secondary | ICD-10-CM | POA: Diagnosis not present

## 2016-11-26 MED ORDER — FLUTICASONE FUROATE-VILANTEROL 200-25 MCG/INH IN AEPB: 1.0000 | INHALATION_SPRAY | Freq: Every day | RESPIRATORY_TRACT | 5 refills | Status: DC

## 2016-11-26 MED ORDER — AZELASTINE HCL 0.05 % OP SOLN: OPHTHALMIC | 5 refills | Status: DC

## 2016-11-26 MED ORDER — FLUTICASONE PROPIONATE 50 MCG/ACT NA SUSP: 2.0000 | Freq: Every day | NASAL | 1 refills | Status: DC

## 2016-11-26 MED ORDER — ALBUTEROL SULFATE HFA 108 (90 BASE) MCG/ACT IN AERS: 2.0000 | INHALATION_SPRAY | Freq: Four times a day (QID) | RESPIRATORY_TRACT | 0 refills | Status: DC | PRN

## 2016-11-26 MED ORDER — CIALIS 5 MG PO TABS
5.0000 mg | ORAL_TABLET | Freq: Every day | ORAL | 5 refills | Status: DC
Start: 2016-11-26 — End: 2017-06-02

## 2016-11-26 MED ORDER — TAMSULOSIN HCL 0.4 MG PO CAPS: 0.4000 mg | ORAL_CAPSULE | Freq: Every evening | ORAL | 5 refills | Status: DC

## 2016-11-26 NOTE — Progress Notes (Signed)
Name: [name]   MRN: [id number]    DOB: 06-Jan-1951   Date:11/26/2016       Progress Note  Subjective  Chief Complaint  Chief Complaint  Patient presents with  . Surgical Clearance  . Medication Refill  . Hypertension    Needs Refill on medication  . Allergic Rhinitis     Takes medication daily  . Knee Pain    Right Knee has had surgery but needs his left knee dont now  . 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 Never had complications from anesthesia, he does not have false teeth. Denies chest pain. Asthma controlled with medication. She is seeing Dr. 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 No dizziness, chest pain or SOB  Obese: he has lost almost 20 lbs since March 2018, he has a history of hyperaldosteronism. He feels like since surgery not as hungry. He eats healthy.   Asthma Moderate: he is using Breo, he states it is working well for him, no longer has chest congestion, cough, wheezing no SOB. He seldom needs to use rescue inhaler, usually during exercise, at most 4 times per month  AR: stable with medication, no sneezing, nasal congestion, mild intermittent rhinorrhea and occasionally takes allegra D, eyes itches if he does not use medication  Knee pain: he has pain, taking tramadol and tizanidine at times.   BPH: taking Cialis and Flomax, doing well on medication  IPSS Questionnaire (AUA-7): Over the past month.   1)  How often have you had a sensation of not emptying  your bladder completely after you finish urinating?  1 - Less than 1 time in 5  2)  How often have you had to urinate again less than two hours after you finished urinating? 1 - Less than 1 time in 5  3)  How often have you found you stopped and started again several times when you urinated?  1 - Less than 1 time in 5  4) How difficult have you found it to postpone urination?  0 - Not at all  5) How often have you had a weak urinary stream?  1 - Less than 1 time in 5  6) How often have you had to push or strain to begin urination?  0 - Not at all  7) How many times did you most typically get up to urinate from the time you went to bed until the time you got up in the morning?  2 - 2 times  Total score:  0-7 mildly symptomatic   8-19 moderately symptomatic   20-35 severely symptomatic       Patient Active Problem List   Diagnosis Date Noted  . Anemia, unspecified 04/30/2016  . History of benign neoplasm of adrenal gland 02/11/2016  . History of iron deficiency anemia 10/10/2015  . BPH (benign prostatic hyperplasia) 06/20/2015  . ED (erectile dysfunction) 06/20/2015  . Allergic rhinitis, seasonal 06/20/2015  . Anemia  of chronic disease 06/20/2015  . Hypogonadism in male 06/20/2015  . Asthma, well controlled, moderate persistent 06/20/2015  . Hypertension, benign 06/20/2015  . Hyperglycemia 06/20/2015  . History of shingles 06/20/2015  . GERD without esophagitis 06/20/2015  . Chronic radicular low back pain 06/20/2015  . History of epilepsy 06/20/2015  . Migraine without aura and without status migrainosus, not intractable 06/20/2015  . Dyslipidemia 06/20/2015  . Primary osteoarthritis of both knees 06/20/2015    Past Surgical History:  Procedure Laterality Date  . ADRENALECTOMY Left 02/11/2016   [facility]  . COLONOSCOPY  02/2012   normal  . JOINT REPLACEMENT    . KNEE ARTHROSCOPY Left 10/06/2009  . SINUS EXPLORATION    . TOTAL KNEE ARTHROPLASTY Right 06/20/2016  . TOTAL KNEE  ARTHROPLASTY Right 06/20/2016   Procedure: TOTAL KNEE ARTHROPLASTY;  Surgeon: Dorna Leitz, MD;  Location: [place];  Service: Orthopedics;  Laterality: Right;    Family History  Problem Relation Age of Onset  . Diabetes Mother   . Heart disease Mother   . Lung disease Mother   . Seizures Maternal Grandmother     Social History   Socioeconomic History  . Marital status: Married    Spouse name: Not on file  . Number of children: Not on file  . Years of education: Not on file  . Highest education level: Not on file  Social Needs  . Financial resource strain: Not on file  . Food insecurity - worry: Not on file  . Food insecurity - inability: Not on file  . Transportation needs - medical: Not on file  . Transportation needs - non-medical: Not on file  Occupational History  . Not on file  Tobacco Use  . Smoking status: Former Smoker    Packs/day: 1.00    Years: 10.00    Pack years: 10.00  . Smokeless tobacco: Never Used  . Tobacco comment: 39 years ago 40 when he stopped  Substance and Sexual Activity  . Alcohol use: No    Alcohol/week: 0.0 oz  . Drug use: No  . Sexual activity: Yes    Partners: Female  Other Topics Concern  . Not on file  Social History Narrative  . Not on file     Current Outpatient Medications:  .  albuterol (PROAIR HFA) 108 (90 Base) MCG/ACT inhaler, Inhale 2 puffs into the lungs every 6 (six) hours as needed for wheezing or shortness of breath., Disp: 1 Inhaler, Rfl: 0 .  azelastine (OPTIVAR) 0.05 % ophthalmic solution, INSTILL 2 DROPS INTO BOTH EYES TWICE A DAY, Disp: 6 mL, Rfl: 5 .  CIALIS 5 MG tablet, TAKE 1 TABLET BY MOUTH EVERY DAY, Disp: 30 tablet, Rfl: 0 .  docusate sodium (COLACE) 100 MG capsule, Take 1 capsule (100 mg total) by mouth 2 (two) times daily., Disp: 30 capsule, Rfl: 0 .  fexofenadine (ALLEGRA) 180 MG tablet, Take 180 mg by mouth daily as needed for allergies or rhinitis., Disp: , Rfl:  .  fexofenadine-pseudoephedrine (ALLEGRA-D  24) 180-240 MG 24 hr tablet, Take 1 tablet by mouth daily as needed (for allergies.)., Disp: , Rfl:  .  fluticasone (FLONASE) 50 MCG/ACT nasal spray, Place 2 sprays into both nostrils daily., Disp: 48 g, Rfl: 1 .  fluticasone furoate-vilanterol (BREO ELLIPTA) 200-25 MCG/INH AEPB, Inhale 1 puff into the lungs daily., Disp: 60 each, Rfl: 5 .  Glucosamine-MSM-Hyaluronic Acd (JOINT HEALTH PO), Take 1 tablet by mouth daily. INSTAFLEX ADVANCED JOINT SUPPORT, Disp: , Rfl:  .  LACTOBACILLUS PO, Take by mouth., Disp: , Rfl:  .  losartan (COZAAR) 50 MG tablet, Take 1 tablet (50 mg total) by mouth daily. In place of Valsartan, Disp: 90 tablet, Rfl: 1 .  Misc Natural Products (GINSENG-COMPLEX PO), Take by mouth., Disp: , Rfl:  .  Misc Natural Products (NF FORMULAS TESTOSTERONE PO), Take 2 tablets by mouth daily. NATURE'S PLUS T-MALE SUPPLEMENT, Disp: , Rfl:  .  montelukast (SINGULAIR) 10 MG tablet, Take 1 tablet (10 mg total) daily by mouth., Disp: 30 tablet, Rfl: 2 .  Multiple Vitamins-Minerals (MULTIVITAMIN ADULTS 50+ PO), Take 1 Package by mouth daily. NATURE'S CODE MEN OVER 50 MULTIVITAMIN PACK, Disp: , Rfl:  .  OVER THE COUNTER MEDICATION, Take 1 tablet by mouth daily. TEST-HD TESTOSTERONE SUPPORT, Disp: , Rfl:  .  patiromer (VELTASSA) 8.4 g packet, Take 8.4 g by mouth daily., Disp: , Rfl:  .  tamsulosin (FLOMAX) 0.4 MG CAPS capsule, Take 1 capsule (0.4 mg total) by mouth every evening., Disp: 90 capsule, Rfl: 0 .  tiZANidine (ZANAFLEX) 2 MG tablet, Take 1 tablet (2 mg total) by mouth every 8 (eight) hours as needed for muscle spasms., Disp: 50 tablet, Rfl: 0 .  traMADol (ULTRAM) 50 MG tablet, Take 1-2 tablets (50-100 mg total) by mouth every 6 (six) hours as needed (for pain)., Disp: 60 tablet, Rfl: 0 .  NUTRITIONAL SUPPLEMENT LIQD, Take 1 scoop by mouth daily as needed (for energy support). [employer] 45X Energizing Berry., Disp: , Rfl:  .  Nutritional Supplements (NUTRITIONAL SUPPLEMENT PO),  Take 1 Bottle by mouth daily. Next Step Fit N Full Shake, Disp: , Rfl:   Allergies  Allergen Reactions  . Lactose Intolerance (Gi) Other (See Comments)    MIGRAINES     ROS  Constitutional: Negative for fever, positive for  weight change.  Respiratory: Negative for cough and shortness of breath.   Cardiovascular: Negative for chest pain or palpitations.  Gastrointestinal: Negative for abdominal pain, no bowel changes.  Musculoskeletal: Positive  for gait problem and  joint swelling.  Skin: Negative for rash.  Neurological: Negative for dizziness or headache.  No other specific complaints in a complete review of systems (except as listed in HPI above).  Objective  Vitals:   11/26/16 1315  BP: 112/74  Pulse: 98  Resp: 18  Temp: 97.7 F (36.5 C)  TempSrc: Oral  SpO2: 98%  Weight: 258 lb 9.6 oz (117.3 kg)  Height: 6' 2"  (1.88 m)    Body mass index is 33.2 kg/m.  Physical Exam  Constitutional: Patient appears well-developed and well-nourished. Obese  No distress.  HEENT: head atraumatic, normocephalic, pupils equal and reactive to light,  neck supple, throat within normal limits Cardiovascular: Normal rate, regular rhythm and normal heart sounds.  No murmur heard. No BLE edema. Pulmonary/Chest: Effort normal and breath sounds normal. No respiratory distress. Abdominal: Soft.  There is no tenderness. Psychiatric: Patient has a normal mood and affect. behavior is normal. Judgment and thought content normal. Muscular skeletal: s/p right knee replacement, finished PT and flexion 110 but not able to extend fully, left knee grinding with rom  Recent Results (from the past 2160 hour(s))  CBC with Differential/Platelet     Status: Abnormal   Collection Time: 10/13/16 10:33 AM  Result Value Ref Range   WBC 7.3 3.8 - 10.6 K/uL   RBC 4.21 (L) 4.40 - 5.90 MIL/uL   Hemoglobin 11.1 (L) 13.0 - 18.0 g/dL   HCT 34.4 (L) 40.0 - 52.0 %  MCV 81.7 80.0 - 100.0 fL   MCH 26.3 26.0 -  34.0 pg   MCHC 32.2 32.0 - 36.0 g/dL   RDW 17.9 (H) 11.5 - 14.5 %   Platelets 259 150 - 440 K/uL   Neutrophils Relative % 63 %   Neutro Abs 4.5 1.4 - 6.5 K/uL   Lymphocytes Relative 25 %   Lymphs Abs 1.8 1.0 - 3.6 K/uL   Monocytes Relative 9 %   Monocytes Absolute 0.7 0.2 - 1.0 K/uL   Eosinophils Relative 2 %   Eosinophils Absolute 0.1 0 - 0.7 K/uL   Basophils Relative 1 %   Basophils Absolute 0.1 0 - 0.1 K/uL  Comprehensive metabolic panel     Status: Abnormal   Collection Time: 10/13/16 10:33 AM  Result Value Ref Range   Sodium 134 (L) 135 - 145 mmol/L   Potassium 4.5 3.5 - 5.1 mmol/L   Chloride 100 (L) 101 - 111 mmol/L   CO2 27 22 - 32 mmol/L   Glucose, Bld 97 65 - 99 mg/dL   BUN 35 (H) 6 - 20 mg/dL   Creatinine, Ser 1.64 (H) 0.61 - 1.24 mg/dL   Calcium 9.5 8.9 - 10.3 mg/dL   Total Protein 8.2 (H) 6.5 - 8.1 g/dL   Albumin 4.3 3.5 - 5.0 g/dL   AST 21 15 - 41 U/L   ALT 18 17 - 63 U/L   Alkaline Phosphatase 92 38 - 126 U/L   Total Bilirubin 0.2 (L) 0.3 - 1.2 mg/dL   GFR calc non Af Amer 42 (L) >60 mL/min   GFR calc Af Amer 49 (L) >60 mL/min    Comment: (NOTE) The eGFR has been calculated using the CKD EPI equation. This calculation has not been validated in all clinical situations. eGFR's persistently <60 mL/min signify possible Chronic Kidney Disease.    Anion gap 7 5 - 15  Iron and TIBC     Status: Abnormal   Collection Time: 10/13/16 10:33 AM  Result Value Ref Range   Iron 24 (L) 45 - 182 ug/dL   TIBC 299 250 - 450 ug/dL   Saturation Ratios 8 (L) 17.9 - 39.5 %   UIBC 275 ug/dL  Ferritin     Status: None   Collection Time: 10/13/16 10:33 AM  Result Value Ref Range   Ferritin 228 24 - 336 ng/mL      PHQ2/9: Depression screen [facility] 2/9 11/26/2016 10/10/2015 06/20/2015 10/31/2014  Decreased Interest 0 0 0 0  Down, Depressed, Hopeless 0 0 0 0  PHQ - 2 Score 0 0 0 0     Fall Risk: Fall Risk  11/26/2016 10/10/2015 06/20/2015  Falls in the past year? No No No     Functional Status Survey: Is the patient deaf or have difficulty hearing?: No Does the patient have difficulty seeing, even when wearing glasses/contacts?: No Does the patient have difficulty concentrating, remembering, or making decisions?: No Does the patient have difficulty walking or climbing stairs?: Yes(Left knee) Does the patient have difficulty dressing or bathing?: No Does the patient have difficulty doing errands alone such as visiting a doctor's office or shopping?: No   Assessment & Plan   1. Hypertension, benign  Well controlled   2. Pre-op examination  Had a knee replacement of right knee this past Summer and tolerated procedure well, no previous history of cardiovascular disorder, no history of DM, low risk of cardiovascular complications, 2.1% may proceed without further testing. Except for follow up with  hematologist which is already scheduled with Dr. Janese Banks  3. Primary osteoarthritis of both knees  S/p right knee replacement and will have left knee done soon   4. Asthma, well controlled, moderate persistent  - albuterol (PROAIR HFA) 108 (90 Base) MCG/ACT inhaler; Inhale 2 puffs into the lungs every 6 (six) hours as needed for wheezing or shortness of breath.  Dispense: 1 Inhaler; Refill: 0 - fluticasone furoate-vilanterol (BREO ELLIPTA) 200-25 MCG/INH AEPB; Inhale 1 puff into the lungs daily.  Dispense: 60 each; Refill: 5  5. Chronic kidney insufficiency, stage 3 (moderate) (HCC)  Continue follow up with Dr. Janese Banks  6. Anemia of chronic disease  Continue follow up with Dr. Janese Banks   7. Allergic rhinitis, seasonal  - azelastine (OPTIVAR) 0.05 % ophthalmic solution; INSTILL 2 DROPS INTO BOTH EYES TWICE A DAY  Dispense: 6 mL; Refill: 5  8. Benign prostatic hyperplasia with urinary obstruction  - CIALIS 5 MG tablet; Take 1 tablet (5 mg total) by mouth daily.  Dispense: 30 tablet; Refill: 5 - tamsulosin (FLOMAX) 0.4 MG CAPS capsule; Take 1 capsule (0.4 mg total)  by mouth every evening.  Dispense: 30 capsule; Refill: 5

## 2016-11-28 ENCOUNTER — Ambulatory Visit: Payer: Medicare Other | Admitting: Family Medicine

## 2016-12-03 ENCOUNTER — Telehealth: Payer: Self-pay | Admitting: *Deleted

## 2016-12-03 ENCOUNTER — Encounter: Payer: Self-pay | Admitting: Family Medicine

## 2016-12-03 DIAGNOSIS — D631 Anemia in chronic kidney disease: Secondary | ICD-10-CM

## 2016-12-03 DIAGNOSIS — N189 Chronic kidney disease, unspecified: Principal | ICD-10-CM

## 2016-12-03 NOTE — Telephone Encounter (Signed)
Can you put in cmp under the lab encounter? Thanks, Astrid Divine

## 2016-12-03 NOTE — Telephone Encounter (Signed)
Per wife, Dr Holley Raring wants a CMP drawn when he comes in for lab Monday. There is currently only a CBC ordered for Monday

## 2016-12-03 NOTE — Telephone Encounter (Signed)
Done. Wife informed. She states that Dr Holley Raring wants copy faxed to him at [phone].  Dr Berenice Primas also wants labs faxed to his office  [phone].

## 2016-12-08 ENCOUNTER — Other Ambulatory Visit: Payer: Medicare Other

## 2016-12-08 ENCOUNTER — Inpatient Hospital Stay: Payer: Medicare Other | Attending: Oncology

## 2016-12-08 ENCOUNTER — Telehealth: Payer: Self-pay | Admitting: *Deleted

## 2016-12-08 ENCOUNTER — Other Ambulatory Visit: Payer: Self-pay | Admitting: *Deleted

## 2016-12-08 DIAGNOSIS — N183 Chronic kidney disease, stage 3 (moderate): Secondary | ICD-10-CM | POA: Diagnosis not present

## 2016-12-08 DIAGNOSIS — D649 Anemia, unspecified: Secondary | ICD-10-CM

## 2016-12-08 DIAGNOSIS — N189 Chronic kidney disease, unspecified: Secondary | ICD-10-CM

## 2016-12-08 DIAGNOSIS — D631 Anemia in chronic kidney disease: Secondary | ICD-10-CM | POA: Diagnosis not present

## 2016-12-08 DIAGNOSIS — I129 Hypertensive chronic kidney disease with stage 1 through stage 4 chronic kidney disease, or unspecified chronic kidney disease: Secondary | ICD-10-CM | POA: Insufficient documentation

## 2016-12-08 LAB — CBC WITH DIFFERENTIAL/PLATELET
BASOS PCT: 1 %
Basophils Absolute: 0.1 10*3/uL (ref 0–0.1)
EOS ABS: 0.1 10*3/uL (ref 0–0.7)
EOS PCT: 2 %
HCT: 32.1 % — ABNORMAL LOW (ref 40.0–52.0)
HEMOGLOBIN: 10.4 g/dL — AB (ref 13.0–18.0)
Lymphocytes Relative: 34 %
Lymphs Abs: 1.7 10*3/uL (ref 1.0–3.6)
MCH: 26.6 pg (ref 26.0–34.0)
MCHC: 32.4 g/dL (ref 32.0–36.0)
MCV: 82.1 fL (ref 80.0–100.0)
Monocytes Absolute: 0.5 10*3/uL (ref 0.2–1.0)
Monocytes Relative: 10 %
NEUTROS PCT: 53 %
Neutro Abs: 2.6 10*3/uL (ref 1.4–6.5)
PLATELETS: 247 10*3/uL (ref 150–440)
RBC: 3.91 MIL/uL — AB (ref 4.40–5.90)
RDW: 16.8 % — ABNORMAL HIGH (ref 11.5–14.5)
WBC: 4.9 10*3/uL (ref 3.8–10.6)

## 2016-12-08 LAB — COMPREHENSIVE METABOLIC PANEL
ALK PHOS: 76 U/L (ref 38–126)
ALT: 18 U/L (ref 17–63)
AST: 26 U/L (ref 15–41)
Albumin: 3.8 g/dL (ref 3.5–5.0)
Anion gap: 8 (ref 5–15)
BILIRUBIN TOTAL: 0.7 mg/dL (ref 0.3–1.2)
BUN: 25 mg/dL — AB (ref 6–20)
CALCIUM: 9.3 mg/dL (ref 8.9–10.3)
CO2: 30 mmol/L (ref 22–32)
CREATININE: 1.52 mg/dL — AB (ref 0.61–1.24)
Chloride: 96 mmol/L — ABNORMAL LOW (ref 101–111)
GFR, EST AFRICAN AMERICAN: 54 mL/min — AB (ref >60)
GFR, EST NON AFRICAN AMERICAN: 46 mL/min — AB (ref >60)
Glucose, Bld: 98 mg/dL (ref 65–99)
Potassium: 4.7 mmol/L (ref 3.5–5.1)
Sodium: 134 mmol/L — ABNORMAL LOW (ref 135–145)
Total Protein: 7.8 g/dL (ref 6.5–8.1)

## 2016-12-08 NOTE — Progress Notes (Signed)
All labs faxed to three offices list in previous note

## 2016-12-08 NOTE — Telephone Encounter (Signed)
Returned call to patient's wife and assured her that I would forward lab results from today to Dr. Holley Raring, Dr. Berenice Primas and [facility], per her VM request.     dhs

## 2016-12-08 NOTE — Telephone Encounter (Addendum)
Wife reports that [name] wants a copy of labs faxed as well also [phone] attn Ronald Pippins

## 2016-12-09 NOTE — Telephone Encounter (Signed)
Faxed surgical clearance and copy of labs to Dr. Berenice Primas at [facility]. Faxed copy of labs to Dr. Holley Raring per patient's wife's request.    dhs

## 2016-12-09 NOTE — Telephone Encounter (Signed)
Tried to fax results to [facility] [phone] attn Ronald Pippins multiple times, but this number just rings; there is no answer.

## 2016-12-10 ENCOUNTER — Other Ambulatory Visit: Payer: Self-pay

## 2016-12-10 NOTE — Telephone Encounter (Signed)
Refill request for Hypertension medication:  Losartan Potassium 50 mg  Last office visit pertaining to hypertension: 11/26/2016  Follow up visit:   05/29/2016  BP Readings from Last 3 Encounters:  11/26/16 112/74  10/13/16 128/86  07/11/16 105/69    Lab Results  Component Value Date   CREATININE 1.52 (H) 12/08/2016   BUN 25 (H) 12/08/2016   NA 134 (L) 12/08/2016   K 4.7 12/08/2016   CL 96 (L) 12/08/2016   CO2 30 12/08/2016

## 2016-12-10 NOTE — Telephone Encounter (Signed)
I faxed to all 3 places on Monday 12/3

## 2016-12-11 DIAGNOSIS — M1712 Unilateral primary osteoarthritis, left knee: Secondary | ICD-10-CM | POA: Diagnosis not present

## 2016-12-12 NOTE — Progress Notes (Signed)
12-08-16 (Epic) CMP (Abnormal) , CBC w/Diff  11-26-16 (Epic) Surgical clearance from Dr. Ancil Boozer   10-13-16 [facility]) Hematology clearance from Dr. Janese Banks  06-09-16 (Epic) CXR  02-28-16 (Chart everywhere) EKG

## 2016-12-15 ENCOUNTER — Encounter (HOSPITAL_COMMUNITY)
Admission: RE | Admit: 2016-12-15 | Discharge: 2016-12-15 | Disposition: A | Discharge status: Home / Self Care | Payer: Medicare Other | Source: Ambulatory Visit | Attending: Orthopedic Surgery | Admitting: Orthopedic Surgery

## 2016-12-15 ENCOUNTER — Ambulatory Visit (HOSPITAL_COMMUNITY)
Admission: RE | Admit: 2016-12-15 | Discharge: 2016-12-15 | Disposition: A | Discharge status: Home / Self Care | Payer: Medicare Other | Source: Ambulatory Visit | Attending: Orthopedic Surgery | Admitting: Orthopedic Surgery

## 2016-12-15 ENCOUNTER — Encounter (HOSPITAL_COMMUNITY): Payer: Self-pay

## 2016-12-15 ENCOUNTER — Other Ambulatory Visit: Payer: Self-pay

## 2016-12-15 DIAGNOSIS — Z01818 Encounter for other preprocedural examination: Secondary | ICD-10-CM | POA: Diagnosis not present

## 2016-12-15 LAB — CBC WITH DIFFERENTIAL/PLATELET
BASOS ABS: 0 10*3/uL (ref 0.0–0.1)
Basophils Relative: 1 %
Eosinophils Absolute: 0.2 10*3/uL (ref 0.0–0.7)
Eosinophils Relative: 3 %
HEMATOCRIT: 34.5 % — AB (ref 39.0–52.0)
Hemoglobin: 10.9 g/dL — ABNORMAL LOW (ref 13.0–17.0)
LYMPHS PCT: 28 %
Lymphs Abs: 1.8 10*3/uL (ref 0.7–4.0)
MCH: 26.5 pg (ref 26.0–34.0)
MCHC: 31.6 g/dL (ref 30.0–36.0)
MCV: 83.9 fL (ref 78.0–100.0)
Monocytes Absolute: 0.5 10*3/uL (ref 0.1–1.0)
Monocytes Relative: 8 %
NEUTROS PCT: 60 %
Neutro Abs: 3.9 10*3/uL (ref 1.7–7.7)
PLATELETS: 272 10*3/uL (ref 150–400)
RBC: 4.11 MIL/uL — AB (ref 4.22–5.81)
RDW: 16 % — ABNORMAL HIGH (ref 11.5–15.5)
WBC: 6.5 10*3/uL (ref 4.0–10.5)

## 2016-12-15 LAB — COMPREHENSIVE METABOLIC PANEL
ALT: 18 U/L (ref 17–63)
AST: 28 U/L (ref 15–41)
Albumin: 3.9 g/dL (ref 3.5–5.0)
Alkaline Phosphatase: 86 U/L (ref 38–126)
Anion gap: 7 (ref 5–15)
BILIRUBIN TOTAL: 0.7 mg/dL (ref 0.3–1.2)
BUN: 33 mg/dL — AB (ref 6–20)
CHLORIDE: 106 mmol/L (ref 101–111)
CO2: 25 mmol/L (ref 22–32)
CREATININE: 1.5 mg/dL — AB (ref 0.61–1.24)
Calcium: 9.5 mg/dL (ref 8.9–10.3)
GFR calc Af Amer: 55 mL/min — ABNORMAL LOW (ref >60)
GFR, EST NON AFRICAN AMERICAN: 47 mL/min — AB (ref >60)
Glucose, Bld: 117 mg/dL — ABNORMAL HIGH (ref 65–99)
Potassium: 4.2 mmol/L (ref 3.5–5.1)
Sodium: 138 mmol/L (ref 135–145)
Total Protein: 7.5 g/dL (ref 6.5–8.1)

## 2016-12-15 LAB — URINALYSIS, ROUTINE W REFLEX MICROSCOPIC
Bilirubin Urine: NEGATIVE
Glucose, UA: NEGATIVE mg/dL
Hgb urine dipstick: NEGATIVE
KETONES UR: NEGATIVE mg/dL
LEUKOCYTES UA: NEGATIVE
NITRITE: NEGATIVE
PH: 5 (ref 5.0–8.0)
PROTEIN: NEGATIVE mg/dL
Specific Gravity, Urine: 1.025 (ref 1.005–1.030)

## 2016-12-15 LAB — SURGICAL PCR SCREEN
MRSA, PCR: NEGATIVE
STAPHYLOCOCCUS AUREUS: NEGATIVE

## 2016-12-15 LAB — PROTIME-INR
INR: 0.95
PROTHROMBIN TIME: 12.6 s (ref 11.4–15.2)

## 2016-12-15 LAB — APTT: APTT: 30 s (ref 24–36)

## 2016-12-15 NOTE — Patient Instructions (Addendum)
Marcelles Clinard Januszewski  12/15/2016   Your procedure is scheduled on: 12-19-16   Report to [facility] Main  Entrance Take [place]  elevators to 3rd floor to  [place] at 5:30 AM.   Call this number if you have problems the morning of surgery [phone]    Remember: ONLY 1 PERSON MAY GO WITH YOU TO SHORT STAY TO GET  READY MORNING OF [place].  Do not eat food or drink liquids :After Midnight.     Take these medicines the morning of surgery with A SIP OF WATER: Montelukast (Singular). You may also bring and use your inhaler, eyedrops, nasal spray as needed.                                 You may not have any metal on your body including hair pins and              piercings  Do not wear jewelry, lotions, powders or deodorant             Men may shave face and neck.   Do not bring valuables to the hospital. [place].  Contacts, dentures or bridgework may not be worn into surgery.  Leave suitcase in the car. After surgery it may be brought to your room.                 Please read over the following fact sheets you were given: _____________________________________________________________________             [facility] - Preparing for Surgery Before surgery, you can play an important role.  Because skin is not sterile, your skin needs to be as free of germs as possible.  You can reduce the number of germs on your skin by washing with CHG (chlorahexidine gluconate) soap before surgery.  CHG is an antiseptic cleaner which kills germs and bonds with the skin to continue killing germs even after washing. Please DO NOT use if you have an allergy to CHG or antibacterial soaps.  If your skin becomes reddened/irritated stop using the CHG and inform your nurse when you arrive at Short Stay. Do not shave (including legs and underarms) for at least 48 hours prior to the first CHG shower.  You may shave your  face/neck. Please follow these instructions carefully:  1.  Shower with CHG Soap the night before surgery and the  morning of Surgery.  2.  If you choose to wash your hair, wash your hair first as usual with your  normal  shampoo.  3.  After you shampoo, rinse your hair and body thoroughly to remove the  shampoo.                           4.  Use CHG as you would any other liquid soap.  You can apply chg directly  to the skin and wash                       Gently with a scrungie or clean washcloth.  5.  Apply the CHG Soap to your body ONLY FROM THE NECK DOWN.   Do not use  on face/ open                           Wound or open sores. Avoid contact with eyes, ears mouth and genitals (private parts).                       Wash face,  Genitals (private parts) with your normal soap.             6.  Wash thoroughly, paying special attention to the area where your surgery  will be performed.  7.  Thoroughly rinse your body with warm water from the neck down.  8.  DO NOT shower/wash with your normal soap after using and rinsing off  the CHG Soap.                9.  Pat yourself dry with a clean towel.            10.  Wear clean pajamas.            11.  Place clean sheets on your bed the night of your first shower and do not  sleep with pets. Day of Surgery : Do not apply any lotions/deodorants the morning of surgery.  Please wear clean clothes to the hospital/surgery center.  FAILURE TO FOLLOW THESE INSTRUCTIONS MAY RESULT IN THE CANCELLATION OF YOUR SURGERY PATIENT SIGNATURE_________________________________  NURSE SIGNATURE__________________________________  ________________________________________________________________________   Adam Phenix  An incentive spirometer is a tool that can help keep your lungs clear and active. This tool measures how well you are filling your lungs with each breath. Taking long deep breaths may help reverse or decrease the chance of developing breathing  (pulmonary) problems (especially infection) following:  A long period of time when you are unable to move or be active. BEFORE THE PROCEDURE   If the spirometer includes an indicator to show your best effort, your nurse or respiratory therapist will set it to a desired goal.  If possible, sit up straight or lean slightly forward. Try not to slouch.  Hold the incentive spirometer in an upright position. INSTRUCTIONS FOR USE  1. Sit on the edge of your bed if possible, or sit up as far as you can in bed or on a chair. 2. Hold the incentive spirometer in an upright position. 3. Breathe out normally. 4. Place the mouthpiece in your mouth and seal your lips tightly around it. 5. Breathe in slowly and as deeply as possible, raising the piston or the ball toward the top of the column. 6. Hold your breath for 3-5 seconds or for as long as possible. Allow the piston or ball to fall to the bottom of the column. 7. Remove the mouthpiece from your mouth and breathe out normally. 8. Rest for a few seconds and repeat Steps 1 through 7 at least 10 times every 1-2 hours when you are awake. Take your time and take a few normal breaths between deep breaths. 9. The spirometer may include an indicator to show your best effort. Use the indicator as a goal to work toward during each repetition. 10. After each set of 10 deep breaths, practice coughing to be sure your lungs are clear. If you have an incision (the cut made at the time of surgery), support your incision when coughing by placing a pillow or rolled up towels firmly against it. Once you are able to get out of bed, walk around  indoors and cough well. You may stop using the incentive spirometer when instructed by your caregiver.  RISKS AND COMPLICATIONS  Take your time so you do not get dizzy or light-headed.  If you are in pain, you may need to take or ask for pain medication before doing incentive spirometry. It is harder to take a deep breath if you  are having pain. AFTER USE  Rest and breathe slowly and easily.  It can be helpful to keep track of a log of your progress. Your caregiver can provide you with a simple table to help with this. If you are using the spirometer at home, follow these instructions: [place] IF:   You are having difficultly using the spirometer.  You have trouble using the spirometer as often as instructed.  Your pain medication is not giving enough relief while using the spirometer.  You develop fever of 100.5 F (38.1 C) or higher. SEEK IMMEDIATE MEDICAL CARE IF:   You cough up bloody sputum that had not been present before.  You develop fever of 102 F (38.9 C) or greater.  You develop worsening pain at or near the incision site. MAKE SURE YOU:   Understand these instructions.  Will watch your condition.  Will get help right away if you are not doing well or get worse. Document Released: 05/05/2006 Document Revised: 03/17/2011 Document Reviewed: 07/06/2006 Exit[employer] Patient Information 2014 [employer], [place].   ________________________________________________________________________  WHAT IS A BLOOD TRANSFUSION? Blood Transfusion Information  A transfusion is the replacement of blood or some of its parts. Blood is made up of multiple cells which provide different functions.  Red blood cells carry oxygen and are used for blood loss replacement.  White blood cells fight against infection.  Platelets control bleeding.  Plasma helps clot blood.  Other blood products are available for specialized needs, such as hemophilia or other clotting disorders. BEFORE THE TRANSFUSION  Who gives blood for transfusions?   Healthy volunteers who are fully evaluated to make sure their blood is safe. This is blood bank blood. Transfusion therapy is the safest it has ever been in the practice of medicine. Before blood is taken from a donor, a complete history is taken to make sure that person has  no history of diseases nor engages in risky social behavior (examples are intravenous drug use or sexual activity with multiple partners). The donor's travel history is screened to minimize risk of transmitting infections, such as malaria. The donated blood is tested for signs of infectious diseases, such as HIV and hepatitis. The blood is then tested to be sure it is compatible with you in order to minimize the chance of a transfusion reaction. If you or a relative donates blood, this is often done in anticipation of surgery and is not appropriate for emergency situations. It takes many days to process the donated blood. RISKS AND COMPLICATIONS Although transfusion therapy is very safe and saves many lives, the main dangers of transfusion include:   Getting an infectious disease.  Developing a transfusion reaction. This is an allergic reaction to something in the blood you were given. Every precaution is taken to prevent this. The decision to have a blood transfusion has been considered carefully by your caregiver before blood is given. Blood is not given unless the benefits outweigh the risks. AFTER THE TRANSFUSION  Right after receiving a blood transfusion, you will usually feel much better and more energetic. This is especially true if your red blood cells have gotten  low (anemic). The transfusion raises the level of the red blood cells which carry oxygen, and this usually causes an energy increase.  The nurse administering the transfusion will monitor you carefully for complications. HOME CARE INSTRUCTIONS  No special instructions are needed after a transfusion. You may find your energy is better. Speak with your caregiver about any limitations on activity for underlying diseases you may have. SEEK MEDICAL CARE IF:   Your condition is not improving after your transfusion.  You develop redness or irritation at the intravenous (IV) site. SEEK IMMEDIATE MEDICAL CARE IF:  Any of the following  symptoms occur over the next 12 hours:  Shaking chills.  You have a temperature by mouth above 102 F (38.9 C), not controlled by medicine.  Chest, back, or muscle pain.  People around you feel you are not acting correctly or are confused.  Shortness of breath or difficulty breathing.  Dizziness and fainting.  You get a rash or develop hives.  You have a decrease in urine output.  Your urine turns a dark color or changes to pink, red, or brown. Any of the following symptoms occur over the next 10 days:  You have a temperature by mouth above 102 F (38.9 C), not controlled by medicine.  Shortness of breath.  Weakness after normal activity.  The white part of the eye turns yellow (jaundice).  You have a decrease in the amount of urine or are urinating less often.  Your urine turns a dark color or changes to pink, red, or brown. Document Released: 12/21/1999 Document Revised: 03/17/2011 Document Reviewed: 08/09/2007 [facility] Patient Information 2014 [place], [place].  _______________________________________________________________________

## 2016-12-16 ENCOUNTER — Other Ambulatory Visit: Payer: Self-pay | Admitting: Orthopedic Surgery

## 2016-12-16 LAB — ABO/RH: ABO/RH(D): O POS

## 2016-12-16 NOTE — Progress Notes (Signed)
12-15-16 CMP result routed to Dr. Berenice Primas

## 2016-12-18 NOTE — H&P (Signed)
TOTAL KNEE ADMISSION H&P  Patient is being admitted for left total knee arthroplasty.  Subjective:  Chief Complaint:left knee pain.  HPI: [name], 66 y.o. male, has a history of pain and functional disability in the left knee due to arthritis and has failed non-surgical conservative treatments for greater than 12 weeks to includeNSAID's and/or analgesics, corticosteriod injections, viscosupplementation injections, weight reduction as appropriate and activity modification.  Onset of symptoms was gradual, starting 3 years ago with gradually worsening course since that time. The patient noted no past surgery on the left knee(s).  Patient currently rates pain in the left knee(s) at 8 out of 10 with activity. Patient has night pain, worsening of pain with activity and weight bearing, pain that interferes with activities of daily living, pain with passive range of motion, crepitus and joint swelling.  Patient has evidence of subchondral cysts, subchondral sclerosis, periarticular osteophytes and joint space narrowing by imaging studies. This patient has had failure of all reasonable conservative care. There is no active infection.the patient has significant medical comorbidities and difficulty with ambulation having recently done his opposite side knee which will require him to be in the hospital for 48 hours.  Patient Active Problem List   Diagnosis Date Noted  . Anemia, unspecified 04/30/2016  . History of benign neoplasm of adrenal gland 02/11/2016  . History of iron deficiency anemia 10/10/2015  . BPH (benign prostatic hyperplasia) 06/20/2015  . ED (erectile dysfunction) 06/20/2015  . Allergic rhinitis, seasonal 06/20/2015  . Anemia of chronic disease 06/20/2015  . Hypogonadism in male 06/20/2015  . Asthma, well controlled, moderate persistent 06/20/2015  . Hypertension, benign 06/20/2015  . Hyperglycemia 06/20/2015  . History of shingles 06/20/2015  . GERD without esophagitis  06/20/2015  . Chronic radicular low back pain 06/20/2015  . History of epilepsy 06/20/2015  . Migraine without aura and without status migrainosus, not intractable 06/20/2015  . Dyslipidemia 06/20/2015  . Primary osteoarthritis of both knees 06/20/2015   Past Medical History:  Diagnosis Date  . Allergic rhinitis   . Anemia   . BPH (benign prostatic hyperplasia)   . Chronic sinusitis   . Complication of anesthesia    work up during surgery 2x in the past   . Decreased libido   . ED (erectile dysfunction)   . Fatigue   . Hematuria   . HTN (hypertension)   . Hypogonadism in male   . Hypokalemia    history of   . IBS (irritable bowel syndrome)   . Low serum vitamin D   . Lumbago   . Migraine   . Mild intermittent asthma   . Reflux   . Shingles   . Unilateral inguinal hernia without obstruction or gangrene     Past Surgical History:  Procedure Laterality Date  . ADRENALECTOMY Left 02/11/2016   [facility]  . COLONOSCOPY  02/2012   normal  . JOINT REPLACEMENT    . KNEE ARTHROSCOPY Left 10/06/2009  . SINUS EXPLORATION    . TOTAL KNEE ARTHROPLASTY Right 06/20/2016  . TOTAL KNEE ARTHROPLASTY Right 06/20/2016   Procedure: TOTAL KNEE ARTHROPLASTY;  Surgeon: Dorna Leitz, MD;  Location: [place];  Service: Orthopedics;  Laterality: Right;    No current facility-administered medications for this encounter.    Current Outpatient Medications  Medication Sig Dispense Refill Last Dose  . albuterol (PROAIR HFA) 108 (90 Base) MCG/ACT inhaler Inhale 2 puffs into the lungs every 6 (six) hours as needed for wheezing or shortness of breath. 1 Inhaler  0   . azelastine (OPTIVAR) 0.05 % ophthalmic solution INSTILL 2 DROPS INTO BOTH EYES TWICE A DAY 6 mL 5   . CIALIS 5 MG tablet Take 1 tablet (5 mg total) by mouth daily. 30 tablet 5   . docusate sodium (COLACE) 100 MG capsule Take 1 capsule (100 mg total) by mouth 2 (two) times daily. (Patient taking differently: Take 100 mg by mouth daily. ) 30 capsule 0  Taking  . ferrous sulfate 325 (65 FE) MG tablet Take 325 mg by mouth 2 (two) times daily with a meal.      . fexofenadine (ALLEGRA) 180 MG tablet Take 180 mg by mouth daily as needed for allergies or rhinitis.   Taking  . fexofenadine-pseudoephedrine (ALLEGRA-D 24) 180-240 MG 24 hr tablet Take 1 tablet by mouth daily as needed (for allergies.).     Marland Kitchen fluticasone (FLONASE) 50 MCG/ACT nasal spray Place 2 sprays into both nostrils daily. 48 g 1 Taking  . fluticasone furoate-vilanterol (BREO ELLIPTA) 200-25 MCG/INH AEPB Inhale 1 puff into the lungs daily. 60 each 5   . Glucosamine-MSM-Hyaluronic Acd (JOINT HEALTH PO) Take 1 tablet by mouth daily. INSTAFLEX ADVANCED JOINT SUPPORT   Taking  . losartan (COZAAR) 50 MG tablet Take 1 tablet (50 mg total) by mouth daily. In place of Valsartan 90 tablet 1 Taking  . Misc Natural Products (NF FORMULAS TESTOSTERONE PO) Take 2 tablets by mouth daily. NATURE'S PLUS T-MALE SUPPLEMENT   Taking  . montelukast (SINGULAIR) 10 MG tablet Take 1 tablet (10 mg total) daily by mouth. 30 tablet 2 Taking  . Multiple Vitamins-Minerals (MULTIVITAMIN ADULTS 50+ PO) Take 1 Package by mouth daily. NATURE'S CODE MEN OVER 50 MULTIVITAMIN PACK   Taking  . OVER THE COUNTER MEDICATION Take 1 tablet by mouth daily. TEST-HD TESTOSTERONE SUPPORT   Taking  . patiromer (VELTASSA) 8.4 g packet Take 8.4 g by mouth at bedtime.    Taking  . Polyethyl Glycol-Propyl Glycol (SYSTANE ULTRA) 0.4-0.3 % SOLN Place 1-2 drops into both eyes 3 (three) times daily as needed (for dry/irritated eyes.).     Marland Kitchen tamsulosin (FLOMAX) 0.4 MG CAPS capsule Take 1 capsule (0.4 mg total) by mouth every evening. 30 capsule 5   . tiZANidine (ZANAFLEX) 2 MG tablet Take 1 tablet (2 mg total) by mouth every 8 (eight) hours as needed for muscle spasms. 50 tablet 0 Taking  . traMADol (ULTRAM) 50 MG tablet Take 1-2 tablets (50-100 mg total) by mouth every 6 (six) hours as needed (for pain). 60 tablet 0 Taking   Allergies   Allergen Reactions  . Lactose Intolerance (Gi) Other (See Comments)    MIGRAINES  . Shellfish Allergy Other (See Comments)    Congestion/breathing problems/migraines.    Social History   Tobacco Use  . Smoking status: Former Smoker    Packs/day: 1.00    Years: 10.00    Pack years: 10.00  . Smokeless tobacco: Never Used  . Tobacco comment: 38 years ago 19 when he stopped  Substance Use Topics  . Alcohol use: No    Alcohol/week: 0.0 oz    Family History  Problem Relation Age of Onset  . Diabetes Mother   . Heart disease Mother   . Lung disease Mother   . Seizures Maternal Grandmother      ROS ROS: I have reviewed the patient's review of systems thoroughly and there are no positive responses as relates to the HPI. Objective:  Physical Exam  Vital signs in last  24 hours:   Well-developed well-nourished patient in no acute distress. Alert and oriented x3 HEENT:within normal limits Cardiac: Regular rate and rhythm Pulmonary: Lungs clear to auscultation Abdomen: Soft and nontender.  Normal active bowel sounds  Musculoskeletal: (Left knee: Limited range of motion.  Painful range of motion.  Trace effusion.  No instability. Labs: Recent Results (from the past 2160 hour(s))  CBC with Differential/Platelet     Status: Abnormal   Collection Time: 10/13/16 10:33 AM  Result Value Ref Range   WBC 7.3 3.8 - 10.6 K/uL   RBC 4.21 (L) 4.40 - 5.90 MIL/uL   Hemoglobin 11.1 (L) 13.0 - 18.0 g/dL   HCT 34.4 (L) 40.0 - 52.0 %   MCV 81.7 80.0 - 100.0 fL   MCH 26.3 26.0 - 34.0 pg   MCHC 32.2 32.0 - 36.0 g/dL   RDW 17.9 (H) 11.5 - 14.5 %   Platelets 259 150 - 440 K/uL   Neutrophils Relative % 63 %   Neutro Abs 4.5 1.4 - 6.5 K/uL   Lymphocytes Relative 25 %   Lymphs Abs 1.8 1.0 - 3.6 K/uL   Monocytes Relative 9 %   Monocytes Absolute 0.7 0.2 - 1.0 K/uL   Eosinophils Relative 2 %   Eosinophils Absolute 0.1 0 - 0.7 K/uL   Basophils Relative 1 %   Basophils Absolute 0.1 0 - 0.1  K/uL  Comprehensive metabolic panel     Status: Abnormal   Collection Time: 10/13/16 10:33 AM  Result Value Ref Range   Sodium 134 (L) 135 - 145 mmol/L   Potassium 4.5 3.5 - 5.1 mmol/L   Chloride 100 (L) 101 - 111 mmol/L   CO2 27 22 - 32 mmol/L   Glucose, Bld 97 65 - 99 mg/dL   BUN 35 (H) 6 - 20 mg/dL   Creatinine, Ser 1.64 (H) 0.61 - 1.24 mg/dL   Calcium 9.5 8.9 - 10.3 mg/dL   Total Protein 8.2 (H) 6.5 - 8.1 g/dL   Albumin 4.3 3.5 - 5.0 g/dL   AST 21 15 - 41 U/L   ALT 18 17 - 63 U/L   Alkaline Phosphatase 92 38 - 126 U/L   Total Bilirubin 0.2 (L) 0.3 - 1.2 mg/dL   GFR calc non Af Amer 42 (L) >60 mL/min   GFR calc Af Amer 49 (L) >60 mL/min    Comment: (NOTE) The eGFR has been calculated using the CKD EPI equation. This calculation has not been validated in all clinical situations. eGFR's persistently <60 mL/min signify possible Chronic Kidney Disease.    Anion gap 7 5 - 15  Iron and TIBC     Status: Abnormal   Collection Time: 10/13/16 10:33 AM  Result Value Ref Range   Iron 24 (L) 45 - 182 ug/dL   TIBC 299 250 - 450 ug/dL   Saturation Ratios 8 (L) 17.9 - 39.5 %   UIBC 275 ug/dL  Ferritin     Status: None   Collection Time: 10/13/16 10:33 AM  Result Value Ref Range   Ferritin 228 24 - 336 ng/mL  Comprehensive metabolic panel     Status: Abnormal   Collection Time: 12/08/16  1:30 PM  Result Value Ref Range   Sodium 134 (L) 135 - 145 mmol/L   Potassium 4.7 3.5 - 5.1 mmol/L   Chloride 96 (L) 101 - 111 mmol/L   CO2 30 22 - 32 mmol/L   Glucose, Bld 98 65 - 99 mg/dL   BUN 25 (  H) 6 - 20 mg/dL   Creatinine, Ser 1.52 (H) 0.61 - 1.24 mg/dL   Calcium 9.3 8.9 - 10.3 mg/dL   Total Protein 7.8 6.5 - 8.1 g/dL   Albumin 3.8 3.5 - 5.0 g/dL   AST 26 15 - 41 U/L   ALT 18 17 - 63 U/L   Alkaline Phosphatase 76 38 - 126 U/L   Total Bilirubin 0.7 0.3 - 1.2 mg/dL   GFR calc non Af Amer 46 (L) >60 mL/min   GFR calc Af Amer 54 (L) >60 mL/min    Comment: (NOTE) The eGFR has been  calculated using the CKD EPI equation. This calculation has not been validated in all clinical situations. eGFR's persistently <60 mL/min signify possible Chronic Kidney Disease.    Anion gap 8 5 - 15  CBC with Differential     Status: Abnormal   Collection Time: 12/08/16  1:30 PM  Result Value Ref Range   WBC 4.9 3.8 - 10.6 K/uL   RBC 3.91 (L) 4.40 - 5.90 MIL/uL   Hemoglobin 10.4 (L) 13.0 - 18.0 g/dL   HCT 32.1 (L) 40.0 - 52.0 %   MCV 82.1 80.0 - 100.0 fL   MCH 26.6 26.0 - 34.0 pg   MCHC 32.4 32.0 - 36.0 g/dL   RDW 16.8 (H) 11.5 - 14.5 %   Platelets 247 150 - 440 K/uL   Neutrophils Relative % 53 %   Neutro Abs 2.6 1.4 - 6.5 K/uL   Lymphocytes Relative 34 %   Lymphs Abs 1.7 1.0 - 3.6 K/uL   Monocytes Relative 10 %   Monocytes Absolute 0.5 0.2 - 1.0 K/uL   Eosinophils Relative 2 %   Eosinophils Absolute 0.1 0 - 0.7 K/uL   Basophils Relative 1 %   Basophils Absolute 0.1 0 - 0.1 K/uL  Urinalysis, Routine w reflex microscopic     Status: Abnormal   Collection Time: 12/15/16  1:45 PM  Result Value Ref Range   Color, Urine AMBER (A) YELLOW    Comment: BIOCHEMICALS MAY BE AFFECTED BY COLOR   APPearance HAZY (A) CLEAR   Specific Gravity, Urine 1.025 1.005 - 1.030   pH 5.0 5.0 - 8.0   Glucose, UA NEGATIVE NEGATIVE mg/dL   Hgb urine dipstick NEGATIVE NEGATIVE   Bilirubin Urine NEGATIVE NEGATIVE   Ketones, ur NEGATIVE NEGATIVE mg/dL   Protein, ur NEGATIVE NEGATIVE mg/dL   Nitrite NEGATIVE NEGATIVE   Leukocytes, UA NEGATIVE NEGATIVE  Surgical pcr screen     Status: None   Collection Time: 12/15/16  1:55 PM  Result Value Ref Range   MRSA, PCR NEGATIVE NEGATIVE   Staphylococcus aureus NEGATIVE NEGATIVE    Comment: (NOTE) The Xpert SA Assay (FDA approved for NASAL specimens in patients 62 years of age and older), is one component of a comprehensive surveillance program. It is not intended to diagnose infection nor to guide or monitor treatment.   APTT     Status: None    Collection Time: 12/15/16  2:35 PM  Result Value Ref Range   aPTT 30 24 - 36 seconds  CBC WITH DIFFERENTIAL     Status: Abnormal   Collection Time: 12/15/16  2:35 PM  Result Value Ref Range   WBC 6.5 4.0 - 10.5 K/uL   RBC 4.11 (L) 4.22 - 5.81 MIL/uL   Hemoglobin 10.9 (L) 13.0 - 17.0 g/dL   HCT 34.5 (L) 39.0 - 52.0 %   MCV 83.9 78.0 - 100.0 fL  MCH 26.5 26.0 - 34.0 pg   MCHC 31.6 30.0 - 36.0 g/dL   RDW 16.0 (H) 11.5 - 15.5 %   Platelets 272 150 - 400 K/uL   Neutrophils Relative % 60 %   Neutro Abs 3.9 1.7 - 7.7 K/uL   Lymphocytes Relative 28 %   Lymphs Abs 1.8 0.7 - 4.0 K/uL   Monocytes Relative 8 %   Monocytes Absolute 0.5 0.1 - 1.0 K/uL   Eosinophils Relative 3 %   Eosinophils Absolute 0.2 0.0 - 0.7 K/uL   Basophils Relative 1 %   Basophils Absolute 0.0 0.0 - 0.1 K/uL  Comprehensive metabolic panel     Status: Abnormal   Collection Time: 12/15/16  2:35 PM  Result Value Ref Range   Sodium 138 135 - 145 mmol/L   Potassium 4.2 3.5 - 5.1 mmol/L   Chloride 106 101 - 111 mmol/L   CO2 25 22 - 32 mmol/L   Glucose, Bld 117 (H) 65 - 99 mg/dL   BUN 33 (H) 6 - 20 mg/dL   Creatinine, Ser 1.50 (H) 0.61 - 1.24 mg/dL   Calcium 9.5 8.9 - 10.3 mg/dL   Total Protein 7.5 6.5 - 8.1 g/dL   Albumin 3.9 3.5 - 5.0 g/dL   AST 28 15 - 41 U/L   ALT 18 17 - 63 U/L   Alkaline Phosphatase 86 38 - 126 U/L   Total Bilirubin 0.7 0.3 - 1.2 mg/dL   GFR calc non Af Amer 47 (L) >60 mL/min   GFR calc Af Amer 55 (L) >60 mL/min    Comment: (NOTE) The eGFR has been calculated using the CKD EPI equation. This calculation has not been validated in all clinical situations. eGFR's persistently <60 mL/min signify possible Chronic Kidney Disease.    Anion gap 7 5 - 15  Protime-INR     Status: None   Collection Time: 12/15/16  2:35 PM  Result Value Ref Range   Prothrombin Time 12.6 11.4 - 15.2 seconds   INR 0.95   Type and screen Order type and screen if day of surgery is less than 15 days from draw of  preadmission visit or order morning of surgery if day of surgery is greater than 6 days from preadmission visit.     Status: None   Collection Time: 12/15/16  2:36 PM  Result Value Ref Range   ABO/RH(D) O POS    Antibody Screen NEG    Sample Expiration 12/29/2016    Extend sample reason NO TRANSFUSIONS OR PREGNANCY IN THE PAST 3 MONTHS   ABO/Rh     Status: None   Collection Time: 12/15/16  2:36 PM  Result Value Ref Range   ABO/RH(D) O POS     Estimated body mass index is 33.04 kg/m as calculated from the following:   Height as of 12/15/16: 6' 2"  (1.88 m).   Weight as of 12/15/16: 116.7 kg (257 lb 6 oz).   Imaging Review Plain radiographs demonstrate severe degenerative joint disease of the left knee(s). The overall alignment ismild varus. The bone quality appears to be fair for age and reported activity level.  Assessment/Plan:  End stage arthritis, left knee   The patient history, physical examination, clinical judgment of the provider and imaging studies are consistent with end stage degenerative joint disease of the left knee(s) and total knee arthroplasty is deemed medically necessary. The treatment options including medical management, injection therapy arthroscopy and arthroplasty were discussed at length. The risks and benefits  of total knee arthroplasty were presented and reviewed. The risks due to aseptic loosening, infection, stiffness, patella tracking problems, thromboembolic complications and other imponderables were discussed. The patient acknowledged the explanation, agreed to proceed with the plan and consent was signed. Patient is being admitted for inpatient treatment for surgery, pain control, PT, OT, prophylactic antibiotics, VTE prophylaxis, progressive ambulation and ADL's and discharge planning. The patient is planning to be discharged home with home health services

## 2016-12-18 NOTE — Anesthesia Preprocedure Evaluation (Signed)
Anesthesia Evaluation  Patient identified by MRN, date of birth, ID band Patient awake    Reviewed: Allergy & Precautions, NPO status , Patient's Chart, lab work & pertinent test results  History of Anesthesia Complications (+) history of anesthetic complications  Airway Mallampati: III  TM Distance: >3 FB Neck ROM: Full    Dental no notable dental hx.    Pulmonary neg pulmonary ROS, asthma (mild, controlled) , former smoker,    Pulmonary exam normal breath sounds clear to auscultation       Cardiovascular hypertension, Pt. on medications negative cardio ROS Normal cardiovascular exam Rhythm:Regular Rate:Normal     Neuro/Psych  Headaches, negative neurological ROS  negative psych ROS   GI/Hepatic negative GI ROS, Neg liver ROS, GERD  ,  Endo/Other  negative endocrine ROS  Renal/GU Renal InsufficiencyRenal diseasenegative Renal ROS  negative genitourinary   Musculoskeletal negative musculoskeletal ROS (+) Arthritis , Osteoarthritis,    Abdominal (+) + obese,   Peds negative pediatric ROS (+)  Hematology negative hematology ROS (+) anemia ,   Anesthesia Other Findings Obese   Reproductive/Obstetrics negative OB ROS                             Anesthesia Physical  Anesthesia Plan  ASA: III  Anesthesia Plan: Regional and Spinal   Post-op Pain Management:  Regional for Post-op pain   Induction: Intravenous  PONV Risk Score and Plan: 1 and Ondansetron, Dexamethasone and Propofol  Airway Management Planned: Nasal Cannula, Mask and Natural Airway  Additional Equipment:   Intra-op Plan:   Post-operative Plan:   Informed Consent: I have reviewed the patients History and Physical, chart, labs and discussed the procedure including the risks, benefits and alternatives for the proposed anesthesia with the patient or authorized representative who has indicated his/her understanding and  acceptance.   Dental advisory given  Plan Discussed with: CRNA and Anesthesiologist  Anesthesia Plan Comments:         Anesthesia Quick Evaluation

## 2016-12-19 ENCOUNTER — Inpatient Hospital Stay (HOSPITAL_COMMUNITY)
Admission: RE | Admit: 2016-12-19 | Discharge: 2016-12-22 | DRG: 470 | Disposition: A | Discharge status: Home Health | Payer: Medicare Other | Source: Ambulatory Visit | Attending: Orthopedic Surgery | Admitting: Orthopedic Surgery

## 2016-12-19 ENCOUNTER — Inpatient Hospital Stay (HOSPITAL_COMMUNITY): Payer: Medicare Other | Admitting: Anesthesiology

## 2016-12-19 ENCOUNTER — Encounter (HOSPITAL_COMMUNITY): Payer: Self-pay | Admitting: *Deleted

## 2016-12-19 ENCOUNTER — Other Ambulatory Visit: Payer: Self-pay

## 2016-12-19 ENCOUNTER — Encounter (HOSPITAL_COMMUNITY)
Admission: RE | Disposition: A | Discharge status: Home Health | Payer: Self-pay | Source: Ambulatory Visit | Attending: Orthopedic Surgery

## 2016-12-19 DIAGNOSIS — M1712 Unilateral primary osteoarthritis, left knee: Secondary | ICD-10-CM | POA: Diagnosis not present

## 2016-12-19 DIAGNOSIS — I10 Essential (primary) hypertension: Secondary | ICD-10-CM | POA: Diagnosis not present

## 2016-12-19 DIAGNOSIS — E669 Obesity, unspecified: Secondary | ICD-10-CM | POA: Diagnosis present

## 2016-12-19 DIAGNOSIS — G8918 Other acute postprocedural pain: Secondary | ICD-10-CM | POA: Diagnosis not present

## 2016-12-19 DIAGNOSIS — Z87891 Personal history of nicotine dependence: Secondary | ICD-10-CM | POA: Diagnosis not present

## 2016-12-19 DIAGNOSIS — K589 Irritable bowel syndrome without diarrhea: Secondary | ICD-10-CM | POA: Diagnosis present

## 2016-12-19 DIAGNOSIS — M25562 Pain in left knee: Secondary | ICD-10-CM | POA: Diagnosis not present

## 2016-12-19 DIAGNOSIS — Z833 Family history of diabetes mellitus: Secondary | ICD-10-CM | POA: Diagnosis not present

## 2016-12-19 DIAGNOSIS — E785 Hyperlipidemia, unspecified: Secondary | ICD-10-CM | POA: Diagnosis not present

## 2016-12-19 DIAGNOSIS — Z6833 Body mass index (BMI) 33.0-33.9, adult: Secondary | ICD-10-CM

## 2016-12-19 DIAGNOSIS — N400 Enlarged prostate without lower urinary tract symptoms: Secondary | ICD-10-CM | POA: Diagnosis present

## 2016-12-19 DIAGNOSIS — D638 Anemia in other chronic diseases classified elsewhere: Secondary | ICD-10-CM | POA: Diagnosis present

## 2016-12-19 DIAGNOSIS — Z96651 Presence of right artificial knee joint: Secondary | ICD-10-CM | POA: Diagnosis not present

## 2016-12-19 DIAGNOSIS — K219 Gastro-esophageal reflux disease without esophagitis: Secondary | ICD-10-CM | POA: Diagnosis present

## 2016-12-19 DIAGNOSIS — Z8249 Family history of ischemic heart disease and other diseases of the circulatory system: Secondary | ICD-10-CM

## 2016-12-19 HISTORY — PX: TOTAL KNEE ARTHROPLASTY: SHX125

## 2016-12-19 LAB — TYPE AND SCREEN
ABO/RH(D): O POS
Antibody Screen: NEGATIVE

## 2016-12-19 SURGERY — ARTHROPLASTY, KNEE, TOTAL
Anesthesia: Regional | Site: Knee | Laterality: Left

## 2016-12-19 MED ORDER — SODIUM CHLORIDE 0.9 % IJ SOLN
INTRAMUSCULAR | Status: AC
  Filled 2016-12-19: qty 50

## 2016-12-19 MED ORDER — BUPIVACAINE LIPOSOME 1.3 % IJ SUSP
20.0000 mL | Freq: Once | INTRAMUSCULAR | Status: DC
  Filled 2016-12-19: qty 20

## 2016-12-19 MED ORDER — FERROUS SULFATE 325 (65 FE) MG PO TABS
325.0000 mg | ORAL_TABLET | Freq: Two times a day (BID) | ORAL | Status: DC
  Administered 2016-12-19 – 2016-12-22 (×6): 325 mg via ORAL
  Filled 2016-12-19 (×5): qty 1

## 2016-12-19 MED ORDER — DIPHENHYDRAMINE HCL 12.5 MG/5ML PO ELIX: 12.5000 mg | ORAL_SOLUTION | ORAL | Status: DC | PRN

## 2016-12-19 MED ORDER — LACTATED RINGERS IV SOLN
INTRAVENOUS | Status: DC | PRN
  Administered 2016-12-19 (×2): via INTRAVENOUS

## 2016-12-19 MED ORDER — ZOLPIDEM TARTRATE 5 MG PO TABS: 5.0000 mg | ORAL_TABLET | Freq: Every evening | ORAL | Status: DC | PRN

## 2016-12-19 MED ORDER — PATIROMER SORBITEX CALCIUM 8.4 G PO PACK
8.4000 g | PACK | Freq: Every day | ORAL | Status: DC
  Filled 2016-12-19 (×3): qty 4

## 2016-12-19 MED ORDER — LOSARTAN POTASSIUM 50 MG PO TABS
50.0000 mg | ORAL_TABLET | Freq: Every day | ORAL | Status: DC
  Administered 2016-12-19 – 2016-12-22 (×4): 50 mg via ORAL
  Filled 2016-12-19 (×4): qty 1

## 2016-12-19 MED ORDER — PROPOFOL 10 MG/ML IV BOLUS
INTRAVENOUS | Status: AC
  Filled 2016-12-19: qty 20

## 2016-12-19 MED ORDER — SODIUM CHLORIDE 0.9 % IR SOLN
Status: DC | PRN
  Administered 2016-12-19: 2000 mL

## 2016-12-19 MED ORDER — ASPIRIN EC 325 MG PO TBEC
325.0000 mg | DELAYED_RELEASE_TABLET | Freq: Two times a day (BID) | ORAL | Status: DC
  Administered 2016-12-19 – 2016-12-22 (×6): 325 mg via ORAL
  Filled 2016-12-19 (×6): qty 1

## 2016-12-19 MED ORDER — KETOTIFEN FUMARATE 0.025 % OP SOLN
2.0000 [drp] | Freq: Two times a day (BID) | OPHTHALMIC | Status: DC
  Administered 2016-12-19 – 2016-12-22 (×7): 2 [drp] via OPHTHALMIC
  Filled 2016-12-19: qty 5

## 2016-12-19 MED ORDER — MONTELUKAST SODIUM 10 MG PO TABS
10.0000 mg | ORAL_TABLET | Freq: Every day | ORAL | Status: DC
  Administered 2016-12-20 – 2016-12-22 (×3): 10 mg via ORAL
  Filled 2016-12-19 (×3): qty 1

## 2016-12-19 MED ORDER — PROPOFOL 500 MG/50ML IV EMUL
INTRAVENOUS | Status: DC | PRN
  Administered 2016-12-19: 100 ug/kg/min via INTRAVENOUS

## 2016-12-19 MED ORDER — METHOCARBAMOL 500 MG PO TABS
500.0000 mg | ORAL_TABLET | Freq: Four times a day (QID) | ORAL | Status: DC | PRN
  Administered 2016-12-19 – 2016-12-22 (×7): 500 mg via ORAL
  Filled 2016-12-19 (×7): qty 1

## 2016-12-19 MED ORDER — PROPOFOL 10 MG/ML IV BOLUS
INTRAVENOUS | Status: AC
  Filled 2016-12-19: qty 40

## 2016-12-19 MED ORDER — ALBUTEROL SULFATE HFA 108 (90 BASE) MCG/ACT IN AERS: 2.0000 | INHALATION_SPRAY | Freq: Four times a day (QID) | RESPIRATORY_TRACT | Status: DC | PRN

## 2016-12-19 MED ORDER — MIDAZOLAM HCL 5 MG/5ML IJ SOLN
INTRAMUSCULAR | Status: DC | PRN
  Administered 2016-12-19: 2 mg via INTRAVENOUS

## 2016-12-19 MED ORDER — ONDANSETRON HCL 4 MG PO TABS: 4.0000 mg | ORAL_TABLET | Freq: Four times a day (QID) | ORAL | Status: DC | PRN

## 2016-12-19 MED ORDER — TAMSULOSIN HCL 0.4 MG PO CAPS
0.4000 mg | ORAL_CAPSULE | Freq: Every evening | ORAL | Status: DC
  Administered 2016-12-19 – 2016-12-21 (×3): 0.4 mg via ORAL
  Filled 2016-12-19 (×3): qty 1

## 2016-12-19 MED ORDER — DEXAMETHASONE SODIUM PHOSPHATE 10 MG/ML IJ SOLN
INTRAMUSCULAR | Status: AC
  Filled 2016-12-19: qty 1

## 2016-12-19 MED ORDER — BUPIVACAINE IN DEXTROSE 0.75-8.25 % IT SOLN
INTRATHECAL | Status: DC | PRN
  Administered 2016-12-19: 15 mg via INTRATHECAL

## 2016-12-19 MED ORDER — CHLORHEXIDINE GLUCONATE 4 % EX LIQD: 60.0000 mL | Freq: Once | CUTANEOUS | Status: DC

## 2016-12-19 MED ORDER — CEFAZOLIN SODIUM-DEXTROSE 2-4 GM/100ML-% IV SOLN
2.0000 g | INTRAVENOUS | Status: AC
  Administered 2016-12-19: 2 g via INTRAVENOUS

## 2016-12-19 MED ORDER — OXYCODONE-ACETAMINOPHEN 5-325 MG PO TABS: 1.0000 | ORAL_TABLET | Freq: Four times a day (QID) | ORAL | 0 refills | Status: DC | PRN

## 2016-12-19 MED ORDER — POLYETHYLENE GLYCOL 3350 17 G PO PACK
17.0000 g | PACK | Freq: Every day | ORAL | Status: DC | PRN
  Administered 2016-12-22: 10:00:00 17 g via ORAL

## 2016-12-19 MED ORDER — ONDANSETRON HCL 4 MG/2ML IJ SOLN
INTRAMUSCULAR | Status: AC
  Filled 2016-12-19: qty 2

## 2016-12-19 MED ORDER — HYDROMORPHONE HCL 1 MG/ML IJ SOLN
0.5000 mg | INTRAMUSCULAR | Status: DC | PRN
  Administered 2016-12-19 (×4): 1 mg via INTRAVENOUS
  Administered 2016-12-19: 12:00:00 0.5 mg via INTRAVENOUS
  Administered 2016-12-20 (×3): 1 mg via INTRAVENOUS
  Filled 2016-12-19 (×8): qty 1

## 2016-12-19 MED ORDER — SODIUM CHLORIDE 0.9 % IJ SOLN
INTRAMUSCULAR | Status: DC | PRN
  Administered 2016-12-19: 30 mL

## 2016-12-19 MED ORDER — CEFAZOLIN SODIUM-DEXTROSE 2-4 GM/100ML-% IV SOLN: 2.0000 g | Freq: Four times a day (QID) | INTRAVENOUS | Status: DC

## 2016-12-19 MED ORDER — ROPIVACAINE HCL 7.5 MG/ML IJ SOLN
INTRAMUSCULAR | Status: DC | PRN
  Administered 2016-12-19: 30 mL via PERINEURAL

## 2016-12-19 MED ORDER — FENTANYL CITRATE (PF) 100 MCG/2ML IJ SOLN
INTRAMUSCULAR | Status: AC
  Filled 2016-12-19: qty 2

## 2016-12-19 MED ORDER — TRANEXAMIC ACID 1000 MG/10ML IV SOLN
1000.0000 mg | Freq: Once | INTRAVENOUS | Status: AC
  Administered 2016-12-19: 1000 mg via INTRAVENOUS
  Filled 2016-12-19: qty 1100

## 2016-12-19 MED ORDER — TIZANIDINE HCL 2 MG PO TABS: 2.0000 mg | ORAL_TABLET | Freq: Three times a day (TID) | ORAL | 0 refills | Status: DC | PRN

## 2016-12-19 MED ORDER — FLUTICASONE PROPIONATE 50 MCG/ACT NA SUSP
2.0000 | Freq: Every day | NASAL | Status: DC
  Administered 2016-12-19 – 2016-12-22 (×2): 2 via NASAL
  Filled 2016-12-19: qty 16

## 2016-12-19 MED ORDER — BUPIVACAINE LIPOSOME 1.3 % IJ SUSP
INTRAMUSCULAR | Status: DC | PRN
  Administered 2016-12-19: 20 mL

## 2016-12-19 MED ORDER — ONDANSETRON HCL 4 MG/2ML IJ SOLN: 4.0000 mg | Freq: Four times a day (QID) | INTRAMUSCULAR | Status: DC | PRN

## 2016-12-19 MED ORDER — MAGNESIUM CITRATE PO SOLN
1.0000 | Freq: Once | ORAL | Status: DC | PRN
Start: 2016-12-19 — End: 2016-12-22

## 2016-12-19 MED ORDER — ACETAMINOPHEN 650 MG RE SUPP: 650.0000 mg | RECTAL | Status: DC | PRN

## 2016-12-19 MED ORDER — DEXAMETHASONE SODIUM PHOSPHATE 10 MG/ML IJ SOLN
10.0000 mg | Freq: Two times a day (BID) | INTRAMUSCULAR | Status: AC
  Administered 2016-12-19 – 2016-12-20 (×3): 10 mg via INTRAVENOUS
  Filled 2016-12-19 (×3): qty 1

## 2016-12-19 MED ORDER — BUPIVACAINE-EPINEPHRINE 0.5% -1:200000 IJ SOLN
INTRAMUSCULAR | Status: DC | PRN
  Administered 2016-12-19: 30 mL

## 2016-12-19 MED ORDER — MIDAZOLAM HCL 2 MG/2ML IJ SOLN
INTRAMUSCULAR | Status: AC
  Filled 2016-12-19: qty 2

## 2016-12-19 MED ORDER — TRANEXAMIC ACID 1000 MG/10ML IV SOLN
2000.0000 mg | INTRAVENOUS | Status: DC
  Filled 2016-12-19: qty 20

## 2016-12-19 MED ORDER — TRANEXAMIC ACID 1000 MG/10ML IV SOLN
1000.0000 mg | INTRAVENOUS | Status: AC
  Administered 2016-12-19: 1000 mg via INTRAVENOUS
  Filled 2016-12-19: qty 1100

## 2016-12-19 MED ORDER — BUPIVACAINE-EPINEPHRINE (PF) 0.5% -1:200000 IJ SOLN
INTRAMUSCULAR | Status: AC
  Filled 2016-12-19: qty 30

## 2016-12-19 MED ORDER — DEXAMETHASONE SODIUM PHOSPHATE 10 MG/ML IJ SOLN
INTRAMUSCULAR | Status: DC | PRN
  Administered 2016-12-19: 10 mg via INTRAVENOUS

## 2016-12-19 MED ORDER — GABAPENTIN 300 MG PO CAPS
300.0000 mg | ORAL_CAPSULE | Freq: Two times a day (BID) | ORAL | Status: DC
  Administered 2016-12-19 – 2016-12-22 (×6): 300 mg via ORAL
  Filled 2016-12-19 (×6): qty 1

## 2016-12-19 MED ORDER — 0.9 % SODIUM CHLORIDE (POUR BTL) OPTIME
TOPICAL | Status: DC | PRN
  Administered 2016-12-19: 1000 mL

## 2016-12-19 MED ORDER — SODIUM CHLORIDE 0.9 % IV SOLN
INTRAVENOUS | Status: DC
  Administered 2016-12-19 (×2): via INTRAVENOUS

## 2016-12-19 MED ORDER — FENTANYL CITRATE (PF) 100 MCG/2ML IJ SOLN
INTRAMUSCULAR | Status: DC | PRN
Start: 2016-12-19 — End: 2016-12-19
  Administered 2016-12-19 (×2): 50 ug via INTRAVENOUS

## 2016-12-19 MED ORDER — DOCUSATE SODIUM 100 MG PO CAPS
100.0000 mg | ORAL_CAPSULE | Freq: Two times a day (BID) | ORAL | Status: DC
  Administered 2016-12-19 – 2016-12-22 (×6): 100 mg via ORAL
  Filled 2016-12-19 (×6): qty 1

## 2016-12-19 MED ORDER — CEFAZOLIN SODIUM-DEXTROSE 2-4 GM/100ML-% IV SOLN
2.0000 g | Freq: Four times a day (QID) | INTRAVENOUS | Status: AC
  Administered 2016-12-19 (×2): 2 g via INTRAVENOUS
  Filled 2016-12-19 (×2): qty 100

## 2016-12-19 MED ORDER — STERILE WATER FOR IRRIGATION IR SOLN
Status: DC | PRN
Start: 2016-12-19 — End: 2016-12-19
  Administered 2016-12-19: 2000 mL

## 2016-12-19 MED ORDER — MEPERIDINE HCL 50 MG/ML IJ SOLN: 6.2500 mg | INTRAMUSCULAR | Status: DC | PRN

## 2016-12-19 MED ORDER — ALUM & MAG HYDROXIDE-SIMETH 200-200-20 MG/5ML PO SUSP
30.0000 mL | ORAL | Status: DC | PRN
Start: 2016-12-19 — End: 2016-12-22

## 2016-12-19 MED ORDER — FLUTICASONE FUROATE-VILANTEROL 200-25 MCG/INH IN AEPB
1.0000 | INHALATION_SPRAY | Freq: Every day | RESPIRATORY_TRACT | Status: DC
  Administered 2016-12-20 – 2016-12-22 (×3): 1 via RESPIRATORY_TRACT
  Filled 2016-12-19: qty 28

## 2016-12-19 MED ORDER — METHOCARBAMOL 1000 MG/10ML IJ SOLN
500.0000 mg | Freq: Four times a day (QID) | INTRAVENOUS | Status: DC | PRN
  Administered 2016-12-19: 11:00:00 500 mg via INTRAVENOUS
  Filled 2016-12-19: qty 550

## 2016-12-19 MED ORDER — OXYCODONE HCL 5 MG PO TABS
5.0000 mg | ORAL_TABLET | ORAL | Status: DC | PRN
  Administered 2016-12-19: 11:00:00 5 mg via ORAL
  Administered 2016-12-19 (×2): 10 mg via ORAL
  Administered 2016-12-19: 5 mg via ORAL
  Administered 2016-12-19 – 2016-12-22 (×14): 10 mg via ORAL
  Filled 2016-12-19 (×12): qty 2
  Filled 2016-12-19: qty 1
  Filled 2016-12-19 (×3): qty 2
  Filled 2016-12-19: qty 1
  Filled 2016-12-19: qty 2

## 2016-12-19 MED ORDER — ACETAMINOPHEN 325 MG PO TABS
650.0000 mg | ORAL_TABLET | ORAL | Status: DC | PRN
  Administered 2016-12-19 – 2016-12-22 (×4): 650 mg via ORAL
  Filled 2016-12-19 (×4): qty 2

## 2016-12-19 MED ORDER — BISACODYL 5 MG PO TBEC: 5.0000 mg | DELAYED_RELEASE_TABLET | Freq: Every day | ORAL | Status: DC | PRN

## 2016-12-19 MED ORDER — ONDANSETRON HCL 4 MG/2ML IJ SOLN: 4.0000 mg | Freq: Once | INTRAMUSCULAR | Status: DC | PRN

## 2016-12-19 MED ORDER — FENTANYL CITRATE (PF) 100 MCG/2ML IJ SOLN: 25.0000 ug | INTRAMUSCULAR | Status: DC | PRN

## 2016-12-19 MED ORDER — ALBUTEROL SULFATE (2.5 MG/3ML) 0.083% IN NEBU
2.5000 mg | INHALATION_SOLUTION | Freq: Four times a day (QID) | RESPIRATORY_TRACT | Status: DC | PRN
  Administered 2016-12-22: 2.5 mg via RESPIRATORY_TRACT
  Filled 2016-12-19: qty 3

## 2016-12-19 MED ORDER — TRANEXAMIC ACID 1000 MG/10ML IV SOLN
INTRAVENOUS | Status: AC | PRN
  Administered 2016-12-19: 2000 mg via TOPICAL

## 2016-12-19 MED ORDER — ONDANSETRON HCL 4 MG/2ML IJ SOLN
INTRAMUSCULAR | Status: DC | PRN
  Administered 2016-12-19: 4 mg via INTRAVENOUS

## 2016-12-19 MED ORDER — ASPIRIN EC 325 MG PO TBEC: 325.0000 mg | DELAYED_RELEASE_TABLET | Freq: Two times a day (BID) | ORAL | 0 refills | Status: DC

## 2016-12-19 MED ORDER — CEFAZOLIN SODIUM-DEXTROSE 2-4 GM/100ML-% IV SOLN
INTRAVENOUS | Status: AC
  Filled 2016-12-19: qty 100

## 2016-12-19 SURGICAL SUPPLY — 60 items
BAG ZIPLOCK 12X15 (MISCELLANEOUS) ×3 IMPLANT
BANDAGE ACE 6X5 VEL STRL LF (GAUZE/BANDAGES/DRESSINGS) ×3 IMPLANT
BENZOIN TINCTURE PRP APPL 2/3 (GAUZE/BANDAGES/DRESSINGS) ×3 IMPLANT
BLADE SAG 18X100X1.27 (BLADE) ×3 IMPLANT
BLADE SAW SGTL 13.0X1.19X90.0M (BLADE) ×3 IMPLANT
BOOTIES KNEE HIGH SLOAN (MISCELLANEOUS) ×3 IMPLANT
BOWL SMART MIX CTS (DISPOSABLE) ×3 IMPLANT
CAPT KNEE TOTAL 3 ATTUNE ×3 IMPLANT
CEMENT HV SMART SET (Cement) ×6 IMPLANT
CHLORAPREP W/TINT 26ML (MISCELLANEOUS) ×6 IMPLANT
CLOSURE WOUND 1/2 X4 (GAUZE/BANDAGES/DRESSINGS) ×1
CUFF TOURN SGL QUICK 34 (TOURNIQUET CUFF) ×2
CUFF TRNQT CYL 34X4X40X1 (TOURNIQUET CUFF) ×1 IMPLANT
DRAPE INCISE 23X17 IOBAN STRL (DRAPES) ×2
DRAPE INCISE IOBAN 23X17 STRL (DRAPES) ×1 IMPLANT
DRAPE U-SHAPE 47X51 STRL (DRAPES) ×3 IMPLANT
DRSG ADAPTIC 3X8 NADH LF (GAUZE/BANDAGES/DRESSINGS) ×3 IMPLANT
DRSG AQUACEL AG ADV 3.5X10 (GAUZE/BANDAGES/DRESSINGS) ×3 IMPLANT
DRSG PAD ABDOMINAL 8X10 ST (GAUZE/BANDAGES/DRESSINGS) ×3 IMPLANT
ELECT REM PT RETURN 15FT ADLT (MISCELLANEOUS) ×3 IMPLANT
GAUZE SPONGE 4X4 12PLY STRL (GAUZE/BANDAGES/DRESSINGS) ×3 IMPLANT
GLOVE BIO SURGEON STRL SZ 6.5 (GLOVE) ×2 IMPLANT
GLOVE BIO SURGEONS STRL SZ 6.5 (GLOVE) ×1
GLOVE BIOGEL PI IND STRL 6.5 (GLOVE) ×1 IMPLANT
GLOVE BIOGEL PI IND STRL 7.5 (GLOVE) ×4 IMPLANT
GLOVE BIOGEL PI IND STRL 8 (GLOVE) ×2 IMPLANT
GLOVE BIOGEL PI INDICATOR 6.5 (GLOVE) ×2
GLOVE BIOGEL PI INDICATOR 7.5 (GLOVE) ×8
GLOVE BIOGEL PI INDICATOR 8 (GLOVE) ×4
GLOVE ECLIPSE 7.5 STRL STRAW (GLOVE) ×3 IMPLANT
GLOVE ECLIPSE 8.0 STRL XLNG CF (GLOVE) ×3 IMPLANT
GOWN STRL REUS W/TWL LRG LVL3 (GOWN DISPOSABLE) ×3 IMPLANT
GOWN STRL REUS W/TWL XL LVL3 (GOWN DISPOSABLE) ×9 IMPLANT
HANDPIECE INTERPULSE COAX TIP (DISPOSABLE) ×2
HOOD PEEL AWAY FLYTE STAYCOOL (MISCELLANEOUS) ×9 IMPLANT
IMMOBILIZER KNEE 20 (SOFTGOODS) ×6 IMPLANT
IMMOBILIZER KNEE 20 THIGH 36 (SOFTGOODS) ×1 IMPLANT
MANIFOLD NEPTUNE II (INSTRUMENTS) ×3 IMPLANT
NEEDLE HYPO 21X1.5 SAFETY (NEEDLE) ×6 IMPLANT
PACK ICE MAXI GEL EZY WRAP (MISCELLANEOUS) ×3 IMPLANT
PACK TOTAL KNEE CUSTOM (KITS) ×3 IMPLANT
PAD ABD 8X10 STRL (GAUZE/BANDAGES/DRESSINGS) ×3 IMPLANT
PADDING CAST COTTON 6X4 STRL (CAST SUPPLIES) ×3 IMPLANT
POSITIONER SURGICAL ARM (MISCELLANEOUS) ×3 IMPLANT
SET HNDPC FAN SPRY TIP SCT (DISPOSABLE) ×1 IMPLANT
STAPLER VISISTAT 35W (STAPLE) IMPLANT
STRIP CLOSURE SKIN 1/2X4 (GAUZE/BANDAGES/DRESSINGS) ×2 IMPLANT
SUT MNCRL AB 3-0 PS2 18 (SUTURE) ×3 IMPLANT
SUT VIC AB 0 CT1 36 (SUTURE) ×3 IMPLANT
SUT VIC AB 1 CT1 36 (SUTURE) ×6 IMPLANT
SUT VIC AB 2-0 CT1 27 (SUTURE) ×2
SUT VIC AB 2-0 CT1 TAPERPNT 27 (SUTURE) ×1 IMPLANT
SWABSTK COMLB BENZOIN TINCTURE (MISCELLANEOUS) ×3 IMPLANT
SYR 20CC LL (SYRINGE) ×3 IMPLANT
SYR 50ML LL SCALE MARK (SYRINGE) ×3 IMPLANT
SYR CONTROL 10ML LL (SYRINGE) ×6 IMPLANT
TOWEL OR NON WOVEN STRL DISP B (DISPOSABLE) ×3 IMPLANT
TRAY FOLEY BAG SILVER LF 16FR (CATHETERS) ×3 IMPLANT
WRAP KNEE MAXI GEL POST OP (GAUZE/BANDAGES/DRESSINGS) ×3 IMPLANT
YANKAUER SUCT BULB TIP NO VENT (SUCTIONS) ×3 IMPLANT

## 2016-12-19 NOTE — Progress Notes (Signed)
Nutrition Brief Note  Patient identified on the Malnutrition Screening Tool (MST) Report  Wt Readings from Last 15 Encounters:  12/19/16 257 lb 6 oz (116.7 kg)  12/15/16 257 lb 6 oz (116.7 kg)  11/26/16 258 lb 9.6 oz (117.3 kg)  10/13/16 250 lb 10.6 oz (113.7 kg)  07/11/16 248 lb 5 oz (112.6 kg)  06/13/16 266 lb 3 oz (120.7 kg)  06/11/16 266 lb 3 oz (120.7 kg)  06/09/16 263 lb 14.4 oz (119.7 kg)  05/12/16 263 lb 5.4 oz (119.4 kg)  05/05/16 265 lb 14 oz (120.6 kg)  03/26/16 270 lb 12.8 oz (122.8 kg)  03/11/16 269 lb 6 oz (122.2 kg)  10/10/15 299 lb 8 oz (135.9 kg)  06/20/15 294 lb 12.8 oz (133.7 kg)  05/03/15 285 lb (129.3 kg)    Body mass index is 33.04 kg/m. Patient meets criteria for obesity based on current BMI. Pt with hx of pain and functional disability in L knee d/t arthritis, failed non-surgical treatment for >12 weeks. Pt admitted following L TKA, which was done this AM.   Current diet order is CLD with no intakes yet.  Medications reviewed; 100 mg Colace BID.  Labs reviewed; BUN: 33 mg/dL, creatinine: 1.5 mg/dL, GFR: 47 mL/min.  IVF: NS @ 100 mL/hr.   No nutrition interventions warranted at this time. If nutrition issues arise, please consult RD.     Jarome Matin, MS, RD, LDN, [facility] Inpatient Clinical Dietitian Pager # [phone] After hours/weekend pager # [phone]

## 2016-12-19 NOTE — Brief Op Note (Signed)
12/19/2016  9:05 AM  PATIENT:  [name]  66 y.o. male  PRE-OPERATIVE DIAGNOSIS:  OSTEOARTHRITIS LEFT KNEE  POST-OPERATIVE DIAGNOSIS:  OSTEOARTHRITIS LEFT KNEE  PROCEDURE:  Procedure(s) with comments: LEFT TOTAL KNEE ARTHROPLASTY (Left) - Adductor Block  SURGEON:  Surgeon(s) and Role:    Dorna Leitz, MD - Primary  PHYSICIAN ASSISTANT:   ASSISTANTS: bethune   ANESTHESIA:   spinal  EBL:  50 mL   BLOOD ADMINISTERED:none  DRAINS: none   LOCAL MEDICATIONS USED:  MARCAINE    and OTHER experel  SPECIMEN:  No Specimen  DISPOSITION OF SPECIMEN:  N/A  COUNTS:  YES  TOURNIQUET:   Total Tourniquet Time Documented: Thigh (Left) - 61 minutes Total: Thigh (Left) - 61 minutes   DICTATION: .Other Dictation: Dictation Number [id number]  PLAN OF CARE: Admit to inpatient   PATIENT DISPOSITION:  PACU - hemodynamically stable.   Delay start of Pharmacological VTE agent (>24hrs) due to surgical blood loss or risk of bleeding: no

## 2016-12-19 NOTE — Anesthesia Postprocedure Evaluation (Signed)
Anesthesia Post Note  Patient: [name]  Procedure(s) Performed: LEFT TOTAL KNEE ARTHROPLASTY (Left Knee)     Patient location during evaluation: PACU Anesthesia Type: Regional Level of consciousness: oriented and awake and alert Pain management: pain level controlled Vital Signs Assessment: post-procedure vital signs reviewed and stable Respiratory status: spontaneous breathing, respiratory function stable and patient connected to nasal cannula oxygen Cardiovascular status: blood pressure returned to baseline and stable Postop Assessment: no headache, no backache and no apparent nausea or vomiting Anesthetic complications: no    Last Vitals:  Vitals:   12/19/16 0945 12/19/16 1000  BP: 129/90 126/81  Pulse: 71 69  Resp: 14 16  Temp:    SpO2: 100% 97%    Last Pain:  Vitals:   12/19/16 1000  TempSrc:   PainSc: 0-No pain                 Dillan Candela

## 2016-12-19 NOTE — Anesthesia Procedure Notes (Signed)
Spinal  Patient location during procedure: OR Start time: 12/19/2016 7:24 AM End time: 12/19/2016 7:26 AM Staffing Resident/CRNA: Lind Covert, CRNA Performed: resident/CRNA  Preanesthetic Checklist Completed: patient identified, site marked, surgical consent, pre-op evaluation, timeout performed, IV checked, risks and benefits discussed and monitors and equipment checked Spinal Block Patient position: sitting Prep: ChloraPrep Patient monitoring: heart rate Approach: midline Location: L3-4 Needle Needle type: Pencan  Needle gauge: 24 G Needle length: 10 cm Needle insertion depth: 7 cm Assessment Sensory level: T6 Additional Notes Timeout performed. SAB kit date checked. SAB without difficulty

## 2016-12-19 NOTE — Discharge Instructions (Signed)

## 2016-12-19 NOTE — Transfer of Care (Signed)
Immediate Anesthesia Transfer of Care Note  Patient: [name]  Procedure(s) Performed: LEFT TOTAL KNEE ARTHROPLASTY (Left Knee)  Patient Location: PACU  Anesthesia Type:Regional and Spinal  Level of Consciousness: awake, alert  and oriented  Airway & Oxygen Therapy: Patient Spontanous Breathing  Post-op Assessment: Report given to RN and Post -op Vital signs reviewed and stable  Post vital signs: Reviewed and stable  Last Vitals:  Vitals:   12/19/16 0538  BP: (!) 143/85  Pulse: 92  Resp: 18  Temp: 36.8 C  SpO2: 98%    Last Pain:  Vitals:   12/19/16 0538  TempSrc: Oral      Patients Stated Pain Goal: 5 (42/[id number]/62 3762)  Complications: No apparent anesthesia complications

## 2016-12-19 NOTE — Anesthesia Procedure Notes (Signed)
Anesthesia Regional Block: Adductor canal block   Pre-Anesthetic Checklist: ,, timeout performed, Correct Patient, Correct Site, Correct Laterality, Correct Procedure, Correct Position, site marked, Risks and benefits discussed,  Surgical consent,  Pre-op evaluation,  At surgeon's request and post-op pain management  Laterality: Left  Prep: chloraprep       Needles:  Injection technique: Single-shot  Needle Type: Echogenic Stimulator Needle     Needle Length: 5cm  Needle Gauge: 22     Additional Needles:   Procedures:, nerve stimulator,,, ultrasound used (permanent image in chart),,,,  Narrative:  Start time: 12/19/2016 6:02 AM End time: 12/19/2016 6:58 AM Injection made incrementally with aspirations every 5 mL.  Performed by: Personally  Anesthesiologist: Janeece Riggers, MD  Additional Notes: Functioning IV was confirmed and monitors were applied.  A 9mm 22ga Arrow echogenic stimulator needle was used. Sterile prep and drape,hand hygiene and sterile gloves were used. Ultrasound guidance: relevant anatomy identified, needle position confirmed, local anesthetic spread visualized around nerve(s)., vascular puncture avoided.  Image printed for medical record. Negative aspiration and negative test dose prior to incremental administration of local anesthetic. The patient tolerated the procedure well.

## 2016-12-19 NOTE — Evaluation (Signed)
Physical Therapy Evaluation Patient Details Name: [name] MRN: [id number] DOB: 10-Oct-1950 Today's Date: 12/19/2016   History of Present Illness  Pt s/p L TKR and with hx of R TKR  Clinical Impression  Pt s/p L TKR and presents with decreased L LE strength/ROM and post op pain limiting functional mobility.  Pt should progress to dc home with family assist and HHPT follow up.    Follow Up Recommendations Home health PT    Equipment Recommendations  None recommended by PT    Recommendations for Other Services OT consult     Precautions / Restrictions Precautions Precautions: Knee;Fall Required Braces or Orthoses: Knee Immobilizer - Left Knee Immobilizer - Left: Discontinue once straight leg raise with < 10 degree lag Restrictions Weight Bearing Restrictions: No Other Position/Activity Restrictions: WBAT      Mobility  Bed Mobility Overal bed mobility: Needs Assistance Bed Mobility: Supine to Sit     Supine to sit: Min assist;+2 for physical assistance;+2 for safety/equipment     General bed mobility comments: cues for sequence and use of R LE to self assist  Transfers Overall transfer level: Needs assistance Equipment used: Rolling walker (2 wheeled) Transfers: Sit to/from Stand Sit to Stand: Min assist;+2 physical assistance;+2 safety/equipment;From elevated surface         General transfer comment: cues for LE management and use of UEs to self assist  Ambulation/Gait Ambulation/Gait assistance: Min assist;+2 physical assistance;+2 safety/equipment Ambulation Distance (Feet): 48 Feet Assistive device: Rolling walker (2 wheeled) Gait Pattern/deviations: Step-to pattern;Decreased step length - right;Decreased step length - left;Shuffle;Trunk flexed Gait velocity: decr Gait velocity interpretation: Below normal speed for age/gender General Gait Details: cues for sequence, posture and position from [employer]            Wheelchair Mobility    Modified Rankin (Stroke Patients Only)       Balance                                             Pertinent Vitals/Pain Pain Assessment: 0-10 Pain Score: 10-Worst pain ever Pain Location: L knee Pain Descriptors / Indicators: Aching;Sore Pain Intervention(s): Limited activity within patient's tolerance;Monitored during session;Premedicated before session;Ice applied;Patient requesting pain meds-RN notified    Home Living Family/patient expects to be discharged to:: Private residence Living Arrangements: Spouse/significant other Available Help at Discharge: Family;Available 24 hours/day Type of Home: House Home Access: Stairs to enter Entrance Stairs-Rails: Left Entrance Stairs-Number of Steps: 5 Home Layout: One level Home Equipment: Walker - 2 wheels;Bedside commode;Cane - single point      Prior Function Level of Independence: Independent with assistive device(s)               Hand Dominance   Dominant Hand: Right    Extremity/Trunk Assessment   Upper Extremity Assessment Upper Extremity Assessment: Overall WFL for tasks assessed    Lower Extremity Assessment Lower Extremity Assessment: LLE deficits/detail       Communication   Communication: No difficulties  Cognition Arousal/Alertness: Awake/alert Behavior During Therapy: WFL for tasks assessed/performed Overall Cognitive Status: Within Functional Limits for tasks assessed                                        General Comments  Exercises Total Joint Exercises Ankle Circles/Pumps: AROM;Both;15 reps;Supine   Assessment/Plan    PT Assessment Patient needs continued PT services  PT Problem List Decreased strength;Decreased range of motion;Decreased activity tolerance;Decreased mobility;Decreased knowledge of use of DME;Pain;Obesity       PT Treatment Interventions DME instruction;Gait training;Stair training;Functional mobility training;Therapeutic  activities;Therapeutic exercise;Patient/family education    PT Goals (Current goals can be found in the Care Plan section)  Acute Rehab PT Goals Patient Stated Goal: Regain IND PT Goal Formulation: With patient Time For Goal Achievement: 12/26/16 Potential to Achieve Goals: Good    Frequency 7X/week   Barriers to discharge        Co-evaluation               AM-PAC PT "6 Clicks" Daily Activity  Outcome Measure Difficulty turning over in bed (including adjusting bedclothes, sheets and blankets)?: Unable Difficulty moving from lying on back to sitting on the side of the bed? : Unable Difficulty sitting down on and standing up from a chair with arms (e.g., wheelchair, bedside commode, etc,.)?: Unable Help needed moving to and from a bed to chair (including a wheelchair)?: A Lot Help needed walking in hospital room?: A Little Help needed climbing 3-5 steps with a railing? : A Lot 6 Click Score: 10    End of Session Equipment Utilized During Treatment: Gait belt;Left knee immobilizer Activity Tolerance: Patient tolerated treatment well;Patient limited by pain Patient left: in bed;with call bell/phone within reach;with family/visitor present Nurse Communication: Mobility status PT Visit Diagnosis: Difficulty in walking, not elsewhere classified (R26.2);Pain Pain - Right/Left: Left Pain - part of body: Knee    Time: 0962-8366 PT Time Calculation (min) (ACUTE ONLY): 23 min   Charges:   PT Evaluation $PT Eval Low Complexity: 1 Low PT Treatments $Gait Training: 8-22 mins   PT G Codes:        Pg [phone]   Deana Krock 12/19/2016, 6:06 PM

## 2016-12-20 LAB — CBC
HEMATOCRIT: 32.7 % — AB (ref 39.0–52.0)
HEMOGLOBIN: 10.5 g/dL — AB (ref 13.0–17.0)
MCH: 26.6 pg (ref 26.0–34.0)
MCHC: 32.1 g/dL (ref 30.0–36.0)
MCV: 83 fL (ref 78.0–100.0)
Platelets: 247 10*3/uL (ref 150–400)
RBC: 3.94 MIL/uL — AB (ref 4.22–5.81)
RDW: 15.7 % — ABNORMAL HIGH (ref 11.5–15.5)
WBC: 11.4 10*3/uL — ABNORMAL HIGH (ref 4.0–10.5)

## 2016-12-20 LAB — BASIC METABOLIC PANEL
ANION GAP: 10 (ref 5–15)
Anion gap: 9 (ref 5–15)
BUN: 24 mg/dL — ABNORMAL HIGH (ref 6–20)
BUN: 24 mg/dL — AB (ref 6–20)
CALCIUM: 9.1 mg/dL (ref 8.9–10.3)
CHLORIDE: 103 mmol/L (ref 101–111)
CO2: 24 mmol/L (ref 22–32)
CO2: 23 mmol/L (ref 22–32)
Calcium: 9.3 mg/dL (ref 8.9–10.3)
Chloride: 103 mmol/L (ref 101–111)
Creatinine, Ser: 1.31 mg/dL — ABNORMAL HIGH (ref 0.61–1.24)
Creatinine, Ser: 1.43 mg/dL — ABNORMAL HIGH (ref 0.61–1.24)
GFR calc Af Amer: 58 mL/min — ABNORMAL LOW (ref >60)
GFR calc non Af Amer: 56 mL/min — ABNORMAL LOW (ref >60)
GFR, EST NON AFRICAN AMERICAN: 50 mL/min — AB (ref >60)
GLUCOSE: 136 mg/dL — AB (ref 65–99)
Glucose, Bld: 157 mg/dL — ABNORMAL HIGH (ref 65–99)
POTASSIUM: 4.6 mmol/L (ref 3.5–5.1)
POTASSIUM: 4.2 mmol/L (ref 3.5–5.1)
SODIUM: 137 mmol/L (ref 135–145)
Sodium: 135 mmol/L (ref 135–145)

## 2016-12-20 NOTE — Op Note (Signed)
[name], [name] NO.:  [id number]  MEDICAL RECORD NO.:  [id number]  LOCATION:  9937                         FACILITY:  [facility]  PHYSICIAN:  [name], M.D.   DATE OF BIRTH:  Aug 10, 1950  DATE OF PROCEDURE:  12/19/2016 DATE OF DISCHARGE:                              OPERATIVE REPORT   PREOPERATIVE DIAGNOSIS:  End-stage degenerative joint disease, left knee.  POSTOPERATIVE DIAGNOSIS:  End-stage degenerative joint disease, left knee.  PRINCIPAL PROCEDURE:  Left total knee replacement with an Attune system, size 8 femur, size 10 tibia, 5 mm bridging bearing, and a 41 mm all polyethylene patella.  SURGEON:  Terry Corning, MD.  ASSISTANT:  Terry Fleet, PA.  ANESTHESIA:  General.  BRIEF HISTORY:  [name] is a 66 year old male with a long history of significant complaints of left knee pain.  He had been treated conservatively for prolonged period of time.  After failure of all conservative care, he was taken to the operating room for left total knee replacement.  DESCRIPTION OF PROCEDURE:  The patient was taken to the operative room. After adequate anesthesia obtained with a spinal anesthetic, I placed the patient supine on the operating table.  Left leg was prepped and draped in usual sterile fashion.  Following this, the leg was exsanguinated.  Blood pressure tourniquet was inflated to 300 mmHg. Following this, a midline incision was made in subcutaneous tissue down to the level of the extensor mechanism.  A medial parapatellar arthrotomy was undertaken.  Following this, attention was turned to the leg where the anterior and posterior cruciates were removed, retropatellar fat pad, synovium on the anterior aspect of the femur, and the medial and lateral meniscus.  Once this was done, attention was turned to the femur where an intramedullary pilot hole was drilled and a 5-degree valgus inclination block was used and 11 mm of distal bone  was resected due to the flexion contracture present as well as the significant flexion contracture that he developed post total knee on the right side.  Once this was done, attention was turned toward sizing the femur, sized it to an 8.  Anterior and posterior cuts were made, chamfers and box.  Attention was then turned toward the tibia which was cut perpendicular to its long axis and spacer blocks were put in place distally, 5 easily goes in and 6 goes in but still tight.  At this point, we went toward sizing the tibia.  It sized to a 10.  It was drilled and keeled with rotational control.  Once that was done, we then went to the tibia and drilled and keeled it and then put the trial components; an 8 femur, 10 tibia.  A 5 mm bridging bearing trial was placed.  I felt it was a little loose, but because of these issues with the other side, I felt that might be good for him.  We then went to the patella, cut 11 mm of bone off, and put a 41 all-poly patella on, and then put through range of motion, excellent stability and range of motion were achieved.  At this point, the trial components were removed, and the final components were cemented into place; size  8 femur, size 10 tibia, and a 6 mm bridging bearing trial was placed and held until the cement harden.  All excess bone cement was removed and when the cement had completely harden, the tourniquet was let down.  At this time, a final check was made.  He was fine in flexion with a 6, but it was still tight.  It was little bit coming into full extension and given his issues with the other side, we went to a 5, opened the 5, and placed it. I did close with a 1 Vicryl running for the medial parapatellar arthrotomy and once that was done, the skin was closed with 0 and 2-0 Vicryl and 3-0 Monocryl subcuticular.  Benzoin and Steri-Strips were applied.  Sterile compressive dressing was applied.  The patient was taken to the recovery room and was  noted to be in satisfactory condition.  Estimated blood loss for the procedure was minimal.     [name], M.D.     [name]  D:  12/19/2016  T:  12/20/2016  Job:  [id number]  cc:   [name], M.D. Fax: [phone]

## 2016-12-20 NOTE — Progress Notes (Signed)
Physical Therapy Treatment Patient Details Name: [name] MRN: [id number] DOB: July 14, 1950 Today's Date: 12/20/2016    History of Present Illness Pt s/p L TKR and with hx of R TKR    PT Comments    The patient ambulated x 125'. Rates pain @ 8, but tolerates well./ plans Dc tomorrow.   Follow Up Recommendations  Home health PT     Equipment Recommendations  None recommended by PT    Recommendations for Other Services       Precautions / Restrictions Precautions Precautions: Knee;Fall Required Braces or Orthoses: Knee Immobilizer - Left Knee Immobilizer - Left: Discontinue once straight leg raise with < 10 degree lag Restrictions Other Position/Activity Restrictions: WBAT    Mobility  Bed Mobility   Bed Mobility: Supine to Sit;Sit to Supine     Supine to sit: Supervision Sit to supine: Min assist   General bed mobility comments: for LLE  Transfers Overall transfer level: Needs assistance Equipment used: Rolling walker (2 wheeled) Transfers: Sit to/from Stand Sit to Stand: Min guard         General transfer comment: light min A to stand from bed;   Ambulation/Gait Ambulation/Gait assistance: Min guard Ambulation Distance (Feet): 125 Feet Assistive device: Rolling walker (2 wheeled) Gait Pattern/deviations: Step-to pattern;Step-through pattern Gait velocity: decr   General Gait Details: cues for sequence, posture and position from [employer]            Wheelchair Mobility    Modified Rankin (Stroke Patients Only)       Balance                                            Cognition Arousal/Alertness: Awake/alert Behavior During Therapy: WFL for tasks assessed/performed Overall Cognitive Status: Within Functional Limits for tasks assessed                                        Exercises Total Joint Exercises Ankle Circles/Pumps: AROM;10 reps Quad Sets: AROM;10 reps Heel Slides:  AAROM;Left;10 reps Hip ABduction/ADduction: AROM;Left;10 reps Straight Leg Raises: AAROM;Left;10 reps Knee Flexion: AAROM;Left;10 reps Goniometric ROM: 10-50 left knee    General Comments        Pertinent Vitals/Pain Pain Score: 8  Pain Location: L knee Pain Descriptors / Indicators: Aching;Sore Pain Intervention(s): Premedicated before session;Monitored during session;Patient requesting pain meds-RN notified;Repositioned;Ice applied    Home Living Family/patient expects to be discharged to:: Private residence Living Arrangements: Spouse/significant other Available Help at Discharge: Family;Available 24 hours/day   Home Access: Stairs to enter Entrance Stairs-Rails: Left   Home Equipment: Gilford Rile - 2 wheels;Bedside commode;Cane - single point      Prior Function Level of Independence: Independent with assistive device(s)      Comments: used cane secondary to pain    PT Goals (current goals can now be found in the care plan section) Acute Rehab PT Goals Patient Stated Goal: Regain IND Progress towards PT goals: Progressing toward goals    Frequency    7X/week      PT Plan Current plan remains appropriate    Co-evaluation              AM-PAC PT "6 Clicks" Daily Activity  Outcome Measure  Difficulty turning over in bed (including adjusting  bedclothes, sheets and blankets)?: A Little Difficulty moving from lying on back to sitting on the side of the bed? : A Little Difficulty sitting down on and standing up from a chair with arms (e.g., wheelchair, bedside commode, etc,.)?: A Little Help needed moving to and from a bed to chair (including a wheelchair)?: A Little Help needed walking in hospital room?: A Little Help needed climbing 3-5 steps with a railing? : A Lot 6 Click Score: 17    End of Session Equipment Utilized During Treatment: Gait belt;Left knee immobilizer Activity Tolerance: Patient tolerated treatment well;Patient limited by pain Patient  left: in bed;with call bell/phone within reach;with bed alarm set Nurse Communication: Mobility status PT Visit Diagnosis: Difficulty in walking, not elsewhere classified (R26.2);Pain Pain - Right/Left: Left Pain - part of body: Knee     Time: 9753-0051 PT Time Calculation (min) (ACUTE ONLY): 30 min  Charges:  $Gait Training: 23-37 mins $Therapeutic Exercise: 8-22 mins                    G CodesTresa Endo PT [phone] }   Claretha Cooper 12/20/2016, 2:47 PM

## 2016-12-20 NOTE — Progress Notes (Signed)
   PATIENT ID: [name]   1 Day Post-Op Procedure(s) (LRB): LEFT TOTAL KNEE ARTHROPLASTY (Left)  Subjective: Doing well, still pain in knee with movement but comfortable in bed. Getting up with PT today, does not feel like he will be ready to go home today.  Objective:  Vitals:   12/20/16 0147 12/20/16 0541  BP: (!) 150/97 (!) 155/100  Pulse:    Resp: 16 16  Temp: 98.7 F (37.1 C) 98.7 F (37.1 C)  SpO2: 96% 98%     L knee dressing c/d/i Wiggles toes, distally NVI Calves swollen, nontender  Labs:  Recent Labs    12/20/16 0502  HGB 10.5*   Recent Labs    12/20/16 0502  WBC 11.4*  RBC 3.94*  HCT 32.7*  PLT 247   Recent Labs    12/20/16 0502  NA 137  K 4.6  CL 103  CO2 24  BUN 24*  CREATININE 1.31*  GLUCOSE 157*  CALCIUM 9.3    Assessment and Plan: 1 day s/p L TKA  up with PT today, requests premed before PT Continue current pain mgmt Creatinine improved, continue IV NS @ 75mg /hr, do not KVO Will continue to monitor   VTE proph: asa, scds

## 2016-12-20 NOTE — Progress Notes (Signed)
Physical Therapy Treatment Patient Details Name: [name] MRN: [id number] DOB: 07-16-1950 Today's Date: 12/20/2016    History of Present Illness Pt s/p L TKR and with hx of R TKR    PT Comments    The patient was premedicated with IV meds and tolerated ambulation and exercises.Plans DC tomorrow.  Follow Up Recommendations  Home health PT     Equipment Recommendations  None recommended by PT    Recommendations for Other Services       Precautions / Restrictions Precautions Precautions: Knee;Fall Required Braces or Orthoses: Knee Immobilizer - Left Knee Immobilizer - Left: Discontinue once straight leg raise with < 10 degree lag Restrictions Other Position/Activity Restrictions: WBAT    Mobility  Bed Mobility         Supine to sit: Min assist     General bed mobility comments: for LLE  Transfers Overall transfer level: Needs assistance Equipment used: Rolling walker (2 wheeled) Transfers: Sit to/from Stand Sit to Stand: Min assist;Min guard         General transfer comment: light min A to stand from bed; min guard from 3:1  Ambulation/Gait Ambulation/Gait assistance: Min assist;+2 safety/equipment Ambulation Distance (Feet): 98 Feet Assistive device: Rolling walker (2 wheeled) Gait Pattern/deviations: Step-to pattern;Step-through pattern;Antalgic Gait velocity: decr   General Gait Details: cues for sequence, posture and position from [employer]            Wheelchair Mobility    Modified Rankin (Stroke Patients Only)       Balance                                            Cognition Arousal/Alertness: Awake/alert Behavior During Therapy: WFL for tasks assessed/performed Overall Cognitive Status: Within Functional Limits for tasks assessed                                        Exercises Total Joint Exercises Ankle Circles/Pumps: AROM;10 reps Quad Sets: AROM;10 reps Heel Slides:  AAROM;Left;10 reps Hip ABduction/ADduction: AROM;Left;10 reps Straight Leg Raises: AAROM;Left;10 reps Knee Flexion: AAROM;Left;10 reps Goniometric ROM: 10-50 left knee    General Comments        Pertinent Vitals/Pain Pain Score: 8  Pain Location: L knee Pain Descriptors / Indicators: Aching;Sore Pain Intervention(s): Monitored during session;Premedicated before session;Patient requesting pain meds-RN notified    Home Living Family/patient expects to be discharged to:: Private residence Living Arrangements: Spouse/significant other Available Help at Discharge: Family;Available 24 hours/day   Home Access: Stairs to enter Entrance Stairs-Rails: Left   Home Equipment: Gilford Rile - 2 wheels;Bedside commode;Cane - single point      Prior Function Level of Independence: Independent with assistive device(s)      Comments: used cane secondary to pain    PT Goals (current goals can now be found in the care plan section) Acute Rehab PT Goals Patient Stated Goal: Regain IND Progress towards PT goals: Progressing toward goals    Frequency    7X/week      PT Plan Current plan remains appropriate    Co-evaluation              AM-PAC PT "6 Clicks" Daily Activity  Outcome Measure  Difficulty turning over in bed (including adjusting bedclothes, sheets and blankets)?: A  Little Difficulty moving from lying on back to sitting on the side of the bed? : A Little Difficulty sitting down on and standing up from a chair with arms (e.g., wheelchair, bedside commode, etc,.)?: A Little Help needed moving to and from a bed to chair (including a wheelchair)?: A Little Help needed walking in hospital room?: A Little Help needed climbing 3-5 steps with a railing? : A Lot 6 Click Score: 17    End of Session Equipment Utilized During Treatment: Gait belt;Left knee immobilizer Activity Tolerance: Patient tolerated treatment well;Patient limited by pain Patient left: in bed;with call  bell/phone within reach   PT Visit Diagnosis: Difficulty in walking, not elsewhere classified (R26.2);Pain Pain - Right/Left: Left Pain - part of body: Knee     Time: 5697-9480 PT Time Calculation (min) (ACUTE ONLY): 35 min  Charges:  $Gait Training: 8-22 mins $Therapeutic Exercise: 8-22 mins                    G CodesTresa Endo PT [phone]    Claretha Cooper 12/20/2016, 12:56 PM

## 2016-12-20 NOTE — Progress Notes (Signed)
OT Note:  Provided pt with pictures of shower seat options.  [place], OTR/L [phone] 12/20/2016

## 2016-12-20 NOTE — Progress Notes (Signed)
Foley cath removed at 0610. Patient tolerated procedure.

## 2016-12-20 NOTE — Progress Notes (Signed)
PT Cancellation Note  Patient Details Name: CONO GEBHARD MRN: [id number] DOB: 07-30-1950   Cancelled Treatment:    Reason Eval/Treat Not Completed: Pain limiting ability to participate   Claretha Cooper 12/20/2016, 11:42 AM

## 2016-12-20 NOTE — Evaluation (Signed)
Occupational Therapy Evaluation Patient Details Name: [name] MRN: [id number] DOB: 15-Aug-1950 Today's Date: 12/20/2016    History of Present Illness Pt s/p L TKR and with hx of R TKR   Clinical Impression   This 66 year old man was admitted for the above sx. All education was completed; however, I will try to provide him with information about shower seat options as 3;1 does not work in his shower and use of RW is not safe option. Pt is aware that this in an out of pocket expense    Follow Up Recommendations  No OT follow up;Supervision/Assistance - 24 hour    Equipment Recommendations  None recommended by OT    Recommendations for Other Services       Precautions / Restrictions Precautions Precautions: Knee;Fall Required Braces or Orthoses: Knee Immobilizer - Left Restrictions Other Position/Activity Restrictions: WBAT      Mobility Bed Mobility         Supine to sit: Min assist     General bed mobility comments: for LLE  Transfers   Equipment used: Rolling walker (2 wheeled)   Sit to Stand: Min assist;Min guard         General transfer comment: light min A to stand from bed; min guard from 3:1    Balance                                           ADL either performed or assessed with clinical judgement   ADL Overall ADL's : Needs assistance/impaired Eating/Feeding: Independent   Grooming: Supervision/safety;Standing   Upper Body Bathing: Set up;Sitting   Lower Body Bathing: Minimal assistance;Sit to/from stand   Upper Body Dressing : Set up;Sitting   Lower Body Dressing: Moderate assistance;Sit to/from stand   Toilet Transfer: Min guard;Ambulation;BSC;Requires wide/bariatric   Toileting- Water quality scientist and Hygiene: Min guard;Sit to/from stand   Tub/ Shower Transfer: Walk-in shower;Min guard;Ambulation     General ADL Comments: pt states his 3:1 doesn't fit well in shower and he has been using walker  in there at times.  Adjustable seat like carex that twists rather than a locking pin system may be a better option for him. Will try to locate resources for him     Vision         Perception     Praxis      Pertinent Vitals/Pain Pain Score: 5  Pain Location: L knee Pain Descriptors / Indicators: Aching;Sore Pain Intervention(s): Limited activity within patient's tolerance;Monitored during session;Repositioned;Premedicated before session     Hand Dominance     Extremity/Trunk Assessment Upper Extremity Assessment Upper Extremity Assessment: Overall WFL for tasks assessed           Communication Communication Communication: No difficulties   Cognition Arousal/Alertness: Awake/alert Behavior During Therapy: WFL for tasks assessed/performed Overall Cognitive Status: Within Functional Limits for tasks assessed                                     General Comments       Exercises     Shoulder Instructions      Home Living Family/patient expects to be discharged to:: Private residence Living Arrangements: Spouse/significant other Available Help at Discharge: Family;Available 24 hours/day   Home Access: Stairs to enter   Entrance Stairs-Rails: Left  Bathroom Shower/Tub: Gaffer;Tub/shower unit   Bathroom Toilet: Handicapped height     Home Equipment: Environmental consultant - 2 wheels;Bedside commode;Cane - single point          Prior Functioning/Environment Level of Independence: Independent with assistive device(s)        Comments: used cane secondary to pain         OT Problem List:        OT Treatment/Interventions:      OT Goals(Current goals can be found in the care plan section) Acute Rehab OT Goals Patient Stated Goal: Regain IND OT Goal Formulation: All assessment and education complete, DC therapy  OT Frequency:     Barriers to D/C:            Co-evaluation              AM-PAC PT "6 Clicks" Daily Activity      Outcome Measure Help from another person eating meals?: None Help from another person taking care of personal grooming?: A Little Help from another person toileting, which includes using toliet, bedpan, or urinal?: A Little Help from another person bathing (including washing, rinsing, drying)?: A Little Help from another person to put on and taking off regular upper body clothing?: A Little Help from another person to put on and taking off regular lower body clothing?: A Lot 6 Click Score: 18   End of Session    Activity Tolerance: Patient tolerated treatment well Patient left: (handed off to PT)  OT Visit Diagnosis: Pain Pain - Right/Left: Left Pain - part of body: Knee                Time: 0355-9741 OT Time Calculation (min): 18 min Charges:  OT General Charges $OT Visit: 1 Visit OT Evaluation $OT Eval Low Complexity: 1 Low G-Codes:     [place], OTR/L [phone] 12/20/2016  [name] 12/20/2016, 12:44 PM

## 2016-12-21 LAB — BASIC METABOLIC PANEL
Anion gap: 9 (ref 5–15)
BUN: 27 mg/dL — AB (ref 6–20)
CALCIUM: 9.1 mg/dL (ref 8.9–10.3)
CO2: 24 mmol/L (ref 22–32)
CREATININE: 1.27 mg/dL — AB (ref 0.61–1.24)
Chloride: 104 mmol/L (ref 101–111)
GFR calc Af Amer: >60 mL/min (ref >60)
GFR, EST NON AFRICAN AMERICAN: 58 mL/min — AB (ref >60)
GLUCOSE: 143 mg/dL — AB (ref 65–99)
POTASSIUM: 4.7 mmol/L (ref 3.5–5.1)
SODIUM: 137 mmol/L (ref 135–145)

## 2016-12-21 LAB — CBC
HCT: 32.1 % — ABNORMAL LOW (ref 39.0–52.0)
Hemoglobin: 10.5 g/dL — ABNORMAL LOW (ref 13.0–17.0)
MCH: 26.9 pg (ref 26.0–34.0)
MCHC: 32.7 g/dL (ref 30.0–36.0)
MCV: 82.1 fL (ref 78.0–100.0)
PLATELETS: 242 10*3/uL (ref 150–400)
RBC: 3.91 MIL/uL — AB (ref 4.22–5.81)
RDW: 15.9 % — AB (ref 11.5–15.5)
WBC: 14.3 10*3/uL — ABNORMAL HIGH (ref 4.0–10.5)

## 2016-12-21 NOTE — Discharge Summary (Addendum)
Patient ID: [name] MRN: [id number] DOB/AGE: 05-29-1950 66 y.o.  Admit date: 12/19/2016 Discharge date: 12/22/2016  Admission Diagnoses:  Principal Problem:   Primary osteoarthritis of left knee   Discharge Diagnoses:  Same  Past Medical History:  Diagnosis Date  . Allergic rhinitis   . Anemia   . BPH (benign prostatic hyperplasia)   . Chronic sinusitis   . Complication of anesthesia    work up during surgery 2x in the past   . Decreased libido   . ED (erectile dysfunction)   . Fatigue   . Hematuria   . HTN (hypertension)   . Hypogonadism in male   . Hypokalemia    history of   . IBS (irritable bowel syndrome)   . Low serum vitamin D   . Lumbago   . Migraine   . Mild intermittent asthma   . Reflux   . Shingles   . Unilateral inguinal hernia without obstruction or gangrene     Surgeries: Procedure(s): LEFT TOTAL KNEE ARTHROPLASTY on 12/19/2016   Consultants:   Discharged Condition: Improved  Hospital Course: [name] is an 66 y.o. male who was admitted 12/19/2016 for operative treatment ofPrimary osteoarthritis of left knee. Patient has severe unremitting pain that affects sleep, daily activities, and work/hobbies. After pre-op clearance the patient was taken to the operating room on 12/19/2016 and underwent  Procedure(s): LEFT TOTAL KNEE ARTHROPLASTY.    Patient was given perioperative antibiotics:  Anti-infectives (From admission, onward)   Start     Dose/Rate Route Frequency Ordered Stop   12/19/16 1400  ceFAZolin (ANCEF) IVPB 2g/100 mL premix     2 g 200 mL/hr over 30 Minutes Intravenous Every 6 hours 12/19/16 1051 12/19/16 2018   12/19/16 1100  ceFAZolin (ANCEF) IVPB 2g/100 mL premix  Status:  Discontinued     2 g 200 mL/hr over 30 Minutes Intravenous Every 6 hours 12/19/16 1047 12/19/16 1051   12/19/16 0637  ceFAZolin (ANCEF) 2-4 GM/100ML-% IVPB    Comments:  Harle Stanford   : cabinet override      12/19/16 0637 12/19/16  0729   12/19/16 0558  ceFAZolin (ANCEF) IVPB 2g/100 mL premix     2 g 200 mL/hr over 30 Minutes Intravenous On call to O.R. 12/19/16 7096 12/19/16 0729       Patient was given sequential compression devices, early ambulation, and asa to prevent DVT.  Patient benefited maximally from hospital stay and there were no complications.    Recent vital signs:  Patient Vitals for the past 24 hrs:  BP Temp Temp src Pulse Resp SpO2  12/21/16 0916 - - - - - 99 %  12/21/16 0630 (!) 145/94 98.5 F (36.9 C) Oral 98 18 99 %  12/20/16 2210 (!) 146/89 99 F (37.2 C) Oral (!) 106 18 96 %  12/20/16 1401 (!) 148/88 98.5 F (36.9 C) Oral (!) 116 16 98 %  12/20/16 1114 - - - - - 98 %     Recent laboratory studies:  Recent Labs    12/20/16 0502 12/20/16 0913 12/21/16 0455  WBC 11.4*  --  14.3*  HGB 10.5*  --  10.5*  HCT 32.7*  --  32.1*  PLT 247  --  242  NA 137 135 137  K 4.6 4.2 4.7  CL 103 103 104  CO2 24 23 24   BUN 24* 24* 27*  CREATININE 1.31* 1.43* 1.27*  GLUCOSE 157* 136* 143*  CALCIUM 9.3 9.1 9.1  Discharge Medications:   Allergies as of 12/21/2016      Reactions   Lactose Intolerance (gi) Other (See Comments)   MIGRAINES   Shellfish Allergy Other (See Comments)   Congestion/breathing problems/migraines.      Medication List    TAKE these medications   albuterol 108 (90 Base) MCG/ACT inhaler Commonly known as:  PROAIR HFA Inhale 2 puffs into the lungs every 6 (six) hours as needed for wheezing or shortness of breath.   aspirin EC 325 MG tablet Take 1 tablet (325 mg total) by mouth 2 (two) times daily after a meal. Take x 1 month post op to decrease risk of blood clots.   azelastine 0.05 % ophthalmic solution Commonly known as:  OPTIVAR INSTILL 2 DROPS INTO BOTH EYES TWICE A DAY   CIALIS 5 MG tablet Generic drug:  tadalafil Take 1 tablet (5 mg total) by mouth daily.   docusate sodium 100 MG capsule Commonly known as:  COLACE Take 1 capsule (100 mg total) by  mouth 2 (two) times daily. What changed:  when to take this   ferrous sulfate 325 (65 FE) MG tablet Take 325 mg by mouth 2 (two) times daily with a meal.   fexofenadine 180 MG tablet Commonly known as:  ALLEGRA Take 180 mg by mouth daily as needed for allergies or rhinitis.   fexofenadine-pseudoephedrine 180-240 MG 24 hr tablet Commonly known as:  ALLEGRA-D 24 Take 1 tablet by mouth daily as needed (for allergies.).   fluticasone 50 MCG/ACT nasal spray Commonly known as:  FLONASE Place 2 sprays into both nostrils daily.   fluticasone furoate-vilanterol 200-25 MCG/INH Aepb Commonly known as:  BREO ELLIPTA Inhale 1 puff into the lungs daily.   JOINT HEALTH PO Take 1 tablet by mouth daily. INSTAFLEX ADVANCED JOINT SUPPORT   losartan 50 MG tablet Commonly known as:  COZAAR Take 1 tablet (50 mg total) by mouth daily. In place of Valsartan   montelukast 10 MG tablet Commonly known as:  SINGULAIR Take 1 tablet (10 mg total) daily by mouth.   MULTIVITAMIN ADULTS 50+ PO Take 1 Package by mouth daily. NATURE'S CODE MEN OVER 50 MULTIVITAMIN PACK   NF FORMULAS TESTOSTERONE PO Take 2 tablets by mouth daily. NATURE'S PLUS T-MALE SUPPLEMENT   OVER THE COUNTER MEDICATION Take 1 tablet by mouth daily. TEST-HD TESTOSTERONE SUPPORT   oxyCODONE-acetaminophen 5-325 MG tablet Commonly known as:  PERCOCET/ROXICET Take 1-2 tablets by mouth every 6 (six) hours as needed for severe pain.   SYSTANE ULTRA 0.4-0.3 % Soln Generic drug:  Polyethyl Glycol-Propyl Glycol Place 1-2 drops into both eyes 3 (three) times daily as needed (for dry/irritated eyes.).   tamsulosin 0.4 MG Caps capsule Commonly known as:  FLOMAX Take 1 capsule (0.4 mg total) by mouth every evening.   tiZANidine 2 MG tablet Commonly known as:  ZANAFLEX Take 1 tablet (2 mg total) by mouth every 8 (eight) hours as needed for muscle spasms.   traMADol 50 MG tablet Commonly known as:  ULTRAM Take 1-2 tablets (50-100 mg  total) by mouth every 6 (six) hours as needed (for pain).   VELTASSA 8.4 g packet Generic drug:  patiromer Take 8.4 g by mouth at bedtime.       Diagnostic Studies: Dg Chest 2 View  Result Date: 12/15/2016 CLINICAL DATA:  Preoperative evaluation. EXAM: CHEST  2 VIEW COMPARISON:  Chest radiograph 06/09/2016 FINDINGS: Normal cardiac contours. Tortuosity of the thoracic aorta. No large area pulmonary consolidation. No pleural effusion or  pneumothorax. Thoracic spine degenerative changes. IMPRESSION: No acute cardiopulmonary process. Electronically Signed   By: Lovey Newcomer M.D.   On: 12/15/2016 15:13    Disposition: 01-Home or Self Care  Discharge Instructions    Call MD / Call 911   Complete by:  As directed    If you experience chest pain or shortness of breath, CALL 911 and be transported to the hospital emergency room.  If you develope a fever above 101 F, pus (white drainage) or increased drainage or redness at the wound, or calf pain, call your surgeon's office.   Constipation Prevention   Complete by:  As directed    Drink plenty of fluids.  Prune juice may be helpful.  You may use a stool softener, such as Colace (over the counter) 100 mg twice a day.  Use MiraLax (over the counter) for constipation as needed.   Diet - low sodium heart healthy   Complete by:  As directed    Increase activity slowly as tolerated   Complete by:  As directed       Follow-up Information    Dorna Leitz, MD. Schedule an appointment as soon as possible for a visit in 2 weeks.   Specialty:  Orthopedic Surgery Contact information: [address] [place] [address] [phone]            Signed: Grier Mitts 12/21/2016, 9:49 AM

## 2016-12-21 NOTE — Progress Notes (Signed)
Physical Therapy Treatment Patient Details Name: [name] MRN: [id number] DOB: [birth year] Today's Date: 12/21/2016    History of Present Illness Pt s/p L TKR and with hx of R TKR    PT Comments    the patient expresses reluctance to DC home. Will check back later.  Follow Up Recommendations  Home health PT     Equipment Recommendations  None recommended by PT    Recommendations for Other Services       Precautions / Restrictions Precautions Precautions: Knee;Fall Required Braces or Orthoses: Knee Immobilizer - Left Knee Immobilizer - Left: Discontinue once straight leg raise with < 10 degree lag    Mobility  B   Wheelchair Mobility    Modified Rankin (Stroke Patients Only)       Balance                                            Cognition Arousal/Alertness: Awake/alert                                            Exercises Total Joint Exercises Ankle Circles/Pumps: AROM;10 reps Quad Sets: AROM;10 reps Heel Slides: AAROM;Left;10 reps Hip ABduction/ADduction: AROM;Left;10 reps Straight Leg Raises: AAROM;Left;10 reps Knee Flexion: AAROM;Left;10 reps    General Comments        Pertinent Vitals/Pain Pain Score: 8  Pain Location: L knee Pain Descriptors / Indicators: Aching;Sore Pain Intervention(s): Monitored during session;Premedicated before session;Ice applied    Home Living                      Prior Function            PT Goals (current goals can now be found in the care plan section) Progress towards PT goals: Progressing toward goals    Frequency    7X/week      PT Plan Current plan remains appropriate    Co-evaluation              AM-PAC PT "6 Clicks" Daily Activity  Outcome Measure  Difficulty turning over in bed (including adjusting bedclothes, sheets and blankets)?: A Little Difficulty moving from lying on back to sitting on the side of the bed? : A  Little Difficulty sitting down on and standing up from a chair with arms (e.g., wheelchair, bedside commode, etc,.)?: A Little Help needed moving to and from a bed to chair (including a wheelchair)?: A Little Help needed walking in hospital room?: A Little Help needed climbing 3-5 steps with a railing? : A Lot 6 Click Score: 17    End of Session Equipment Utilized During Treatment: Gait belt;Left knee immobilizer Activity Tolerance: Patient tolerated treatment well Patient left: in bed;with call bell/phone within reach;with bed alarm set Nurse Communication: Mobility status PT Visit Diagnosis: Difficulty in walking, not elsewhere classified (R26.2);Pain Pain - Right/Left: Left Pain - part of body: Knee     Time: 1240-1300 PT Time Calculation (min) (ACUTE ONLY): 20 min  ChargeTher ex 1                    G CodesTresa Endo PT [phone]    Claretha Cooper 12/21/2016, 1:31 PM

## 2016-12-21 NOTE — Progress Notes (Signed)
   PATIENT ID: [name]   2 Days Post-Op Procedure(s) (LRB): LEFT TOTAL KNEE ARTHROPLASTY (Left)  Subjective:  Doing well, still pain in knee with movement but comfortable in bed. Getting up with PT today,  feels like he will be ready to go home today.    Objective:  Vitals:   12/21/16 0630 12/21/16 0916  BP: (!) 145/94   Pulse: 98   Resp: 18   Temp: 98.5 F (36.9 C)   SpO2: 99% 99%    L knee dressing c/d/i Wiggles toes, distally NVI Calves swollen, nontender    Labs:  Recent Labs    12/20/16 0502 12/21/16 0455  HGB 10.5* 10.5*   Recent Labs    12/20/16 0502 12/21/16 0455  WBC 11.4* 14.3*  RBC 3.94* 3.91*  HCT 32.7* 32.1*  PLT 247 242   Recent Labs    12/20/16 0913 12/21/16 0455  NA 135 137  K 4.2 4.7  CL 103 104  CO2 23 24  BUN 24* 27*  CREATININE 1.43* 1.27*  GLUCOSE 136* 143*  CALCIUM 9.1 9.1    Assessment and Plan: 2 days s/p L TKA  up with PT today, requests premed before PT Okay to d/c home today when cleared by PT, d/c orders in Hypertensive, at baseline, need FU with pcp Continue current pain mgmt Creatinine improved Fu with Dr. Berenice Primas in 1-2 weeks  VTE proph: asa, scds

## 2016-12-21 NOTE — Progress Notes (Signed)
Physical Therapy Treatment Patient Details Name: [name] MRN: [id number] DOB: 1950/03/06 Today's Date: 12/21/2016    History of Present Illness Pt s/p L TKR and with hx of R TKR    PT Comments    The patient is progressing. Patient indicates that he had a  Difficult time with pain  after DC last surgery.    Follow Up Recommendations  Home health PT     Equipment Recommendations  None recommended by PT    Recommendations for Other Services       Precautions / Restrictions Precautions Precautions: Knee;Fall Required Braces or Orthoses: Knee Immobilizer - Left Knee Immobilizer - Left: Discontinue once straight leg raise with < 10 degree lag    Mobility  Bed Mobility   Bed Mobility: Supine to Sit;Sit to Supine     Supine to sit: Supervision Sit to supine: Min assist   General bed mobility comments: for LLE  Transfers Overall transfer level: Needs assistance Equipment used: Rolling walker (2 wheeled) Transfers: Sit to/from Stand Sit to Stand: Min guard         General transfer comment: light min A to stand from bed;   Ambulation/Gait Ambulation/Gait assistance: Min guard Ambulation Distance (Feet): 220 Feet Assistive device: Rolling walker (2 wheeled) Gait Pattern/deviations: Step-to pattern;Step-through pattern     General Gait Details: cues for sequence, posture and position from RW   Stairs Stairs: Yes   Stair Management: Backwards;With walker Number of Stairs: 2 General stair comments: cues for technique  Wheelchair Mobility    Modified Rankin (Stroke Patients Only)       Balance                                            Cognition Arousal/Alertness: Awake/alert                                            Exercises Total Joint Exercises Ankle Circles/Pumps: AROM;10 reps Quad Sets: AROM;10 reps Straight Leg Raises: AAROM;Left;10 reps Knee Flexion: AAROM;Left;10 reps    General  Comments        Pertinent Vitals/Pain Pain Score: 8  Pain Location: L knee Pain Descriptors / Indicators: Aching;Sore Pain Intervention(s): Monitored during session;Premedicated before session;Ice applied    Home Living                      Prior Function            PT Goals (current goals can now be found in the care plan section) Progress towards PT goals: Progressing toward goals    Frequency    7X/week      PT Plan Current plan remains appropriate    Co-evaluation              AM-PAC PT "6 Clicks" Daily Activity  Outcome Measure  Difficulty turning over in bed (including adjusting bedclothes, sheets and blankets)?: A Little Difficulty moving from lying on back to sitting on the side of the bed? : A Little Difficulty sitting down on and standing up from a chair with arms (e.g., wheelchair, bedside commode, etc,.)?: A Little Help needed moving to and from a bed to chair (including a wheelchair)?: A Little Help needed walking in hospital room?: A Little  Help needed climbing 3-5 steps with a railing? : A Lot 6 Click Score: 17    End of Session Equipment Utilized During Treatment: Gait belt;Left knee immobilizer Activity Tolerance: Patient tolerated treatment well;Patient limited by pain Patient left: in bed;with call bell/phone within reach;with bed alarm set Nurse Communication: Mobility status PT Visit Diagnosis: Difficulty in walking, not elsewhere classified (R26.2);Pain Pain - Right/Left: Left Pain - part of body: Knee     Time: 1120-1145 PT Time Calculation (min) (ACUTE ONLY): 25 min  Charges:  $Gait Training: 8-22 mins $Therapeutic Exercise: 8-22 mins                    G CodesTresa Endo PT [phone]    Claretha Cooper 12/21/2016, 1:27 PM

## 2016-12-22 LAB — CBC
HEMATOCRIT: 30 % — AB (ref 39.0–52.0)
Hemoglobin: 9.6 g/dL — ABNORMAL LOW (ref 13.0–17.0)
MCH: 26.4 pg (ref 26.0–34.0)
MCHC: 32 g/dL (ref 30.0–36.0)
MCV: 82.4 fL (ref 78.0–100.0)
PLATELETS: 216 10*3/uL (ref 150–400)
RBC: 3.64 MIL/uL — ABNORMAL LOW (ref 4.22–5.81)
RDW: 15.9 % — AB (ref 11.5–15.5)
WBC: 12 10*3/uL — AB (ref 4.0–10.5)

## 2016-12-22 NOTE — Progress Notes (Signed)
Discharge planning, spoke with patient at bedside. Have chosen [facility] for HH PT. Contacted [facility] for referral. Has RW and 3n1. [phone] 

## 2016-12-22 NOTE — Progress Notes (Signed)
Physical Therapy Treatment Patient Details Name: [name] MRN: [id number] DOB: 04-27-1950 Today's Date: 12/22/2016    History of Present Illness Pt s/p L TKR and with hx of R TKR    PT Comments    POD # 3 am session Assisted OOB to amb in hallway with increased time due to high pain and c/o fatigue.  Pt will need another PT session to address stairs.    Follow Up Recommendations  Home health PT     Equipment Recommendations  None recommended by PT    Recommendations for Other Services       Precautions / Restrictions Precautions Precautions: Knee;Fall Knee Immobilizer - Left: Discontinue once straight leg raise with < 10 degree lag Restrictions Weight Bearing Restrictions: No Other Position/Activity Restrictions: WBAT    Mobility  Bed Mobility Overal bed mobility: Needs Assistance Bed Mobility: Supine to Sit     Supine to sit: Min assist     General bed mobility comments: for LLE  Transfers Overall transfer level: Needs assistance Equipment used: Rolling walker (2 wheeled) Transfers: Sit to/from Stand Sit to Stand: Supervision;Min guard         General transfer comment: light min A to stand from bed;   Ambulation/Gait Ambulation/Gait assistance: Supervision;Min guard Ambulation Distance (Feet): 85 Feet Assistive device: Rolling walker (2 wheeled) Gait Pattern/deviations: Step-to pattern;Step-through pattern Gait velocity: decr   General Gait Details: cues for sequence, posture and position from [employer]            Wheelchair Mobility    Modified Rankin (Stroke Patients Only)       Balance                                            Cognition Arousal/Alertness: Awake/alert Behavior During Therapy: WFL for tasks assessed/performed Overall Cognitive Status: Within Functional Limits for tasks assessed                                        Exercises      General Comments         Pertinent Vitals/Pain Pain Assessment: 0-10 Pain Score: 10-Worst pain ever Pain Location: L knee Pain Descriptors / Indicators: Aching;Sore;Operative site guarding Pain Intervention(s): Monitored during session;Repositioned;Ice applied;Premedicated before session    Home Living                      Prior Function            PT Goals (current goals can now be found in the care plan section) Progress towards PT goals: Progressing toward goals    Frequency    7X/week      PT Plan Current plan remains appropriate    Co-evaluation              AM-PAC PT "6 Clicks" Daily Activity  Outcome Measure  Difficulty turning over in bed (including adjusting bedclothes, sheets and blankets)?: A Little Difficulty moving from lying on back to sitting on the side of the bed? : A Little Difficulty sitting down on and standing up from a chair with arms (e.g., wheelchair, bedside commode, etc,.)?: A Little Help needed moving to and from a bed to chair (including a wheelchair)?: A Little Help needed walking in  hospital room?: A Little Help needed climbing 3-5 steps with a railing? : A Lot 6 Click Score: 17    End of Session Equipment Utilized During Treatment: Gait belt Activity Tolerance: Patient tolerated treatment well Patient left: in bed;with call bell/phone within reach;with bed alarm set Nurse Communication: Mobility status PT Visit Diagnosis: Difficulty in walking, not elsewhere classified (R26.2);Pain Pain - Right/Left: Left Pain - part of body: Knee     Time: 1035-1100 PT Time Calculation (min) (ACUTE ONLY): 25 min  Charges:  $Gait Training: 8-22 mins $Therapeutic Activity: 8-22 mins                    G Codes:       Rica Koyanagi  PTA [facility]  Acute  Rehab Pager      [phone]

## 2016-12-22 NOTE — Progress Notes (Signed)
Physical Therapy Treatment Patient Details Name: [name] MRN: [id number] DOB: [birth year] Today's Date: 12/22/2016    History of Present Illness Pt s/p L TKR and with hx of R TKR    PT Comments    POD # 3 pm session Spouse present.  Assisted pt OOB to amb to bathroom then in hallway.  Pt easily fatigues.  Deconditioned.  Also practiced stairs.  Pt recalls proper tech up backward with walker from when he had his first TKR.   Pt progressing well and ready for D/C to home  Follow Up Recommendations  Home health PT     Equipment Recommendations  None recommended by PT    Recommendations for Other Services       Precautions / Restrictions Precautions Precautions: Knee;Fall Knee Immobilizer - Left: Discontinue once straight leg raise with < 10 degree lag Restrictions Weight Bearing Restrictions: No Other Position/Activity Restrictions: WBAT    Mobility  Bed Mobility Overal bed mobility: Needs Assistance Bed Mobility: Supine to Sit     Supine to sit: Min assist     General bed mobility comments: for LLE  Transfers Overall transfer level: Needs assistance Equipment used: Rolling walker (2 wheeled) Transfers: Sit to/from Stand Sit to Stand: Supervision;Min guard         General transfer comment: light min A to stand from bed;   Ambulation/Gait Ambulation/Gait assistance: Supervision;Min guard Ambulation Distance (Feet): 110 Feet Assistive device: Rolling walker (2 wheeled) Gait Pattern/deviations: Step-to pattern;Step-through pattern Gait velocity: decr   General Gait Details: cues for sequence, posture and position from [employer] Stairs: Yes   Stair Management: No rails;Backwards;With walker Number of Stairs: 4 General stair comments: with spouse present to observe        only one VC needed on proper walker placement.  Pt recalls from last TKR stair training  Wheelchair Mobility    Modified Rankin (Stroke Patients Only)        Balance                                            Cognition Arousal/Alertness: Awake/alert Behavior During Therapy: WFL for tasks assessed/performed Overall Cognitive Status: Within Functional Limits for tasks assessed                                        Exercises      General Comments        Pertinent Vitals/Pain Pain Assessment: 0-10 Pain Score: 10-Worst pain ever Pain Location: L knee Pain Descriptors / Indicators: Aching;Sore;Operative site guarding Pain Intervention(s): Monitored during session;Repositioned;Ice applied;Premedicated before session    Home Living                      Prior Function            PT Goals (current goals can now be found in the care plan section) Progress towards PT goals: Progressing toward goals    Frequency    7X/week      PT Plan Current plan remains appropriate    Co-evaluation              AM-PAC PT "6 Clicks" Daily Activity  Outcome Measure  Difficulty turning over in bed (including adjusting bedclothes, sheets and blankets)?: A  Little Difficulty moving from lying on back to sitting on the side of the bed? : A Little Difficulty sitting down on and standing up from a chair with arms (e.g., wheelchair, bedside commode, etc,.)?: A Little Help needed moving to and from a bed to chair (including a wheelchair)?: A Little Help needed walking in hospital room?: A Little Help needed climbing 3-5 steps with a railing? : A Lot 6 Click Score: 17    End of Session Equipment Utilized During Treatment: Gait belt Activity Tolerance: Patient tolerated treatment well Patient left: in bed;with call bell/phone within reach;with bed alarm set Nurse Communication: Mobility status PT Visit Diagnosis: Difficulty in walking, not elsewhere classified (R26.2);Pain Pain - Right/Left: Left Pain - part of body: Knee     Time: 3500-9381 PT Time Calculation (min) (ACUTE ONLY): 26  min  Charges:  $Gait Training: 8-22 mins $Therapeutic Activity: 8-22 mins                    G Codes:       Rica Koyanagi  PTA [facility]  Acute  Rehab Pager      [phone]

## 2016-12-22 NOTE — Progress Notes (Signed)
Subjective: 3 Days Post-Op Procedure(s) (LRB): LEFT TOTAL KNEE ARTHROPLASTY (Left) Patient reports pain as moderate.  Making progress with physical therapy.  Taking by mouth and voiding okay.  Objective: Vital signs in last 24 hours: Temp:  [98 F (36.7 C)-98.5 F (36.9 C)] 98 F (36.7 C) (12/17 0607) Pulse Rate:  [105-123] 123 (12/17 0946) Resp:  [18] 18 (12/17 0607) BP: (113-127)/(67-91) 121/76 (12/17 0946) SpO2:  [97 %-100 %] 99 % (12/17 0946)  Intake/Output from previous day: 12/16 0701 - 12/17 0700 In: 720 [P.O.:720] Out: 2250 [Urine:2250] Intake/Output this shift: Total I/O In: 240 [P.O.:240] Out: 400 [Urine:400]  Recent Labs    12/20/16 0502 12/21/16 0455 12/22/16 0518  HGB 10.5* 10.5* 9.6*   Recent Labs    12/21/16 0455 12/22/16 0518  WBC 14.3* 12.0*  RBC 3.91* 3.64*  HCT 32.1* 30.0*  PLT 242 216   Recent Labs    12/20/16 0913 12/21/16 0455  NA 135 137  K 4.2 4.7  CL 103 104  CO2 23 24  BUN 24* 27*  CREATININE 1.43* 1.27*  GLUCOSE 136* 143*  CALCIUM 9.1 9.1   No results for input(s): LABPT, INR in the last 72 hours. Left knee exam: Neurovascular intact Sensation intact distally Intact pulses distally Incision: dressing C/D/I Compartment soft  Assessment/Plan: 3 Days Post-Op Procedure(s) (LRB): LEFT TOTAL KNEE ARTHROPLASTY (Left)  Plan: Up with therapy Discharge home with home health  Aspirin 325 mg twice daily for DVT prophylaxis Times one month. Follow-up with Dr. Berenice Primas in 2 weeks.  Cinzia Devos G 12/22/2016, 10:32 AM

## 2016-12-23 ENCOUNTER — Other Ambulatory Visit: Payer: Self-pay

## 2016-12-23 DIAGNOSIS — I10 Essential (primary) hypertension: Secondary | ICD-10-CM

## 2016-12-23 MED ORDER — LOSARTAN POTASSIUM 50 MG PO TABS: 50.0000 mg | ORAL_TABLET | Freq: Every day | ORAL | 1 refills | Status: DC

## 2016-12-23 NOTE — Telephone Encounter (Signed)
Refill request for Hypertension medication:  Losartan 50 mg  Last office visit pertaining to hypertension: 11/26/2016  Follow up visit: 05/29/2017  BP Readings from Last 3 Encounters:  12/22/16 110/67  12/15/16 116/76  11/26/16 112/74     Lab Results  Component Value Date   CREATININE 1.27 (H) 12/21/2016   BUN 27 (H) 12/21/2016   NA 137 12/21/2016   K 4.7 12/21/2016   CL 104 12/21/2016   CO2 24 12/21/2016

## 2016-12-24 DIAGNOSIS — J4540 Moderate persistent asthma, uncomplicated: Secondary | ICD-10-CM | POA: Diagnosis not present

## 2016-12-24 DIAGNOSIS — G43009 Migraine without aura, not intractable, without status migrainosus: Secondary | ICD-10-CM | POA: Diagnosis not present

## 2016-12-24 DIAGNOSIS — Z471 Aftercare following joint replacement surgery: Secondary | ICD-10-CM | POA: Diagnosis not present

## 2016-12-24 DIAGNOSIS — M5416 Radiculopathy, lumbar region: Secondary | ICD-10-CM | POA: Diagnosis not present

## 2016-12-24 DIAGNOSIS — Z96652 Presence of left artificial knee joint: Secondary | ICD-10-CM | POA: Diagnosis not present

## 2016-12-24 DIAGNOSIS — M1711 Unilateral primary osteoarthritis, right knee: Secondary | ICD-10-CM | POA: Diagnosis not present

## 2016-12-25 ENCOUNTER — Other Ambulatory Visit (HOSPITAL_COMMUNITY): Payer: Self-pay | Admitting: Orthopedic Surgery

## 2016-12-25 ENCOUNTER — Telehealth: Payer: Self-pay | Admitting: Family Medicine

## 2016-12-25 ENCOUNTER — Ambulatory Visit (HOSPITAL_COMMUNITY)
Admission: RE | Admit: 2016-12-25 | Discharge: 2016-12-25 | Disposition: A | Discharge status: Home / Self Care | Payer: Medicare Other | Source: Ambulatory Visit | Attending: Orthopedic Surgery | Admitting: Orthopedic Surgery

## 2016-12-25 DIAGNOSIS — I82442 Acute embolism and thrombosis of left tibial vein: Secondary | ICD-10-CM | POA: Diagnosis not present

## 2016-12-25 DIAGNOSIS — Z96652 Presence of left artificial knee joint: Secondary | ICD-10-CM | POA: Insufficient documentation

## 2016-12-25 DIAGNOSIS — M7989 Other specified soft tissue disorders: Principal | ICD-10-CM

## 2016-12-25 DIAGNOSIS — I82432 Acute embolism and thrombosis of left popliteal vein: Secondary | ICD-10-CM | POA: Diagnosis not present

## 2016-12-25 DIAGNOSIS — M79605 Pain in left leg: Secondary | ICD-10-CM

## 2016-12-25 DIAGNOSIS — M1712 Unilateral primary osteoarthritis, left knee: Secondary | ICD-10-CM | POA: Diagnosis not present

## 2016-12-25 DIAGNOSIS — M79662 Pain in left lower leg: Secondary | ICD-10-CM | POA: Diagnosis present

## 2016-12-25 NOTE — Telephone Encounter (Signed)
PA Modena Slater called from orthopaedist office Patient had a knee replacement and now has a DVT in that leg He is not having any dyspnea, low sats, etc to suggest a PE; they believe he just has a localized DVT; pain, swelling, positive scan They are going to start a blood thinner I reviewed chart, noted the anemia; blood transfusion listed in the chart but PA says he did not receive a blood transfusion, just expected drop from surgery He is seeing Dr. Janese Banks, has CKD, was on EPO Colonoscopy UTD 2014 They will start either Eliquis or Xarelto I explained that I am not the patient's primary and will send this message to her

## 2016-12-25 NOTE — Progress Notes (Signed)
Left lower extremity venous duplex has been completed. There is evidence of acute deep vein thrombosis involving the popliteal, posterior tibial, and peroneal veins of the left lower extremity. Results were given to [facility] at Dr. Berenice Primas' office.   12/25/16 2:21 PM [name] RVT

## 2016-12-26 DIAGNOSIS — M1711 Unilateral primary osteoarthritis, right knee: Secondary | ICD-10-CM | POA: Diagnosis not present

## 2016-12-26 DIAGNOSIS — Z96652 Presence of left artificial knee joint: Secondary | ICD-10-CM | POA: Diagnosis not present

## 2016-12-26 DIAGNOSIS — M5416 Radiculopathy, lumbar region: Secondary | ICD-10-CM | POA: Diagnosis not present

## 2016-12-26 DIAGNOSIS — Z471 Aftercare following joint replacement surgery: Secondary | ICD-10-CM | POA: Diagnosis not present

## 2016-12-26 DIAGNOSIS — G43009 Migraine without aura, not intractable, without status migrainosus: Secondary | ICD-10-CM | POA: Diagnosis not present

## 2016-12-26 DIAGNOSIS — J4540 Moderate persistent asthma, uncomplicated: Secondary | ICD-10-CM | POA: Diagnosis not present

## 2016-12-27 DIAGNOSIS — Z96652 Presence of left artificial knee joint: Secondary | ICD-10-CM | POA: Diagnosis not present

## 2016-12-27 DIAGNOSIS — G43009 Migraine without aura, not intractable, without status migrainosus: Secondary | ICD-10-CM | POA: Diagnosis not present

## 2016-12-27 DIAGNOSIS — M1711 Unilateral primary osteoarthritis, right knee: Secondary | ICD-10-CM | POA: Diagnosis not present

## 2016-12-27 DIAGNOSIS — M5416 Radiculopathy, lumbar region: Secondary | ICD-10-CM | POA: Diagnosis not present

## 2016-12-27 DIAGNOSIS — Z471 Aftercare following joint replacement surgery: Secondary | ICD-10-CM | POA: Diagnosis not present

## 2016-12-27 DIAGNOSIS — J4540 Moderate persistent asthma, uncomplicated: Secondary | ICD-10-CM | POA: Diagnosis not present

## 2016-12-31 DIAGNOSIS — M1711 Unilateral primary osteoarthritis, right knee: Secondary | ICD-10-CM | POA: Diagnosis not present

## 2016-12-31 DIAGNOSIS — J4540 Moderate persistent asthma, uncomplicated: Secondary | ICD-10-CM | POA: Diagnosis not present

## 2016-12-31 DIAGNOSIS — Z471 Aftercare following joint replacement surgery: Secondary | ICD-10-CM | POA: Diagnosis not present

## 2016-12-31 DIAGNOSIS — G43009 Migraine without aura, not intractable, without status migrainosus: Secondary | ICD-10-CM | POA: Diagnosis not present

## 2016-12-31 DIAGNOSIS — M5416 Radiculopathy, lumbar region: Secondary | ICD-10-CM | POA: Diagnosis not present

## 2016-12-31 DIAGNOSIS — Z96652 Presence of left artificial knee joint: Secondary | ICD-10-CM | POA: Diagnosis not present

## 2017-01-01 DIAGNOSIS — I10 Essential (primary) hypertension: Secondary | ICD-10-CM | POA: Diagnosis not present

## 2017-01-01 DIAGNOSIS — E876 Hypokalemia: Secondary | ICD-10-CM | POA: Diagnosis not present

## 2017-01-02 DIAGNOSIS — Z471 Aftercare following joint replacement surgery: Secondary | ICD-10-CM | POA: Diagnosis not present

## 2017-01-02 DIAGNOSIS — M5416 Radiculopathy, lumbar region: Secondary | ICD-10-CM | POA: Diagnosis not present

## 2017-01-02 DIAGNOSIS — J4540 Moderate persistent asthma, uncomplicated: Secondary | ICD-10-CM | POA: Diagnosis not present

## 2017-01-02 DIAGNOSIS — Z96652 Presence of left artificial knee joint: Secondary | ICD-10-CM | POA: Diagnosis not present

## 2017-01-02 DIAGNOSIS — M1711 Unilateral primary osteoarthritis, right knee: Secondary | ICD-10-CM | POA: Diagnosis not present

## 2017-01-02 DIAGNOSIS — G43009 Migraine without aura, not intractable, without status migrainosus: Secondary | ICD-10-CM | POA: Diagnosis not present

## 2017-01-02 LAB — BASIC METABOLIC PANEL
BUN: 35 — AB (ref 4–21)
Creatinine: 1.8 — AB (ref 0.6–1.3)
GLUCOSE: 117
Potassium: 5.8 — AB (ref 3.4–5.3)
SODIUM: 132 — AB (ref 137–147)

## 2017-01-02 LAB — HEPATIC FUNCTION PANEL
ALT: 119 — AB (ref 10–40)
AST: 68 — AB (ref 14–40)

## 2017-01-05 ENCOUNTER — Ambulatory Visit: Payer: Self-pay | Admitting: *Deleted

## 2017-01-05 DIAGNOSIS — M25562 Pain in left knee: Secondary | ICD-10-CM | POA: Diagnosis not present

## 2017-01-05 DIAGNOSIS — M25662 Stiffness of left knee, not elsewhere classified: Secondary | ICD-10-CM | POA: Diagnosis not present

## 2017-01-05 DIAGNOSIS — M1712 Unilateral primary osteoarthritis, left knee: Secondary | ICD-10-CM | POA: Diagnosis not present

## 2017-01-05 DIAGNOSIS — Z471 Aftercare following joint replacement surgery: Secondary | ICD-10-CM | POA: Diagnosis not present

## 2017-01-05 DIAGNOSIS — Z96652 Presence of left artificial knee joint: Secondary | ICD-10-CM | POA: Diagnosis not present

## 2017-01-05 NOTE — Telephone Encounter (Signed)
Pt had knee replacement 12/19/16. On 12/23/16 dx with "early clot" and placed on Eliquis. Pt reports dizziness in  PT session today. Had MD appt prior to session; approximately 25 cc drained off knee. Wife states "did a lot of activities today."  B/P checked by P.T. 105/68. States was able to continue with P.T. session. Reports B/P presently 100/68. Has not taken BP meds since surgery due to parameters set by surgeon (hold if systolic <782). Takes percocet Q6hrs, Zanaflex BID as ordered PRN. Last dose of percocet at 0900, Dilaudid at 1030. Pt reports "mild lightheadedness" , mostly with standing. Pt will only see Dr. Ancil Boozer. appt made for 01/19/17. Advised to make positional changes slowly,rest, stay hydrated, monitor BP, try alternative methods of pain control; space out PRN meds longer intervals. Instructed to call back if symptoms worsen.  Reason for Disposition . [1] MODERATE dizziness (e.g., interferes with normal activities) AND [2] has been evaluated by physician for this  Answer Assessment - Initial Assessment Questions 1. DESCRIPTION: "Describe your dizziness."     Lightheaded 2. LIGHTHEADED: "Do you feel lightheaded?" (e.g., somewhat faint, woozy, weak upon standing)     yes 3. VERTIGO: "Do you feel like either you or the room is spinning or tilting?" (i.e. vertigo)     no 4. SEVERITY: "How bad is it?"  "Do you feel like you are going to faint?" "Can you stand and walk?"   - MILD - walking normally   - MODERATE - interferes with normal activities (e.g., work, school)    - SEVERE - unable to stand, requires support to walk, feels like passing out now.      mild 5. ONSET:  "When did the dizziness begin?"     This afternoon 6. AGGRAVATING FACTORS: "Does anything make it worse?" (e.g., standing, change in head position)     Sit to standing 7. HEART RATE: "Can you tell me your heart rate?" "How many beats in 15 seconds?"  (Note: not all patients can do this)       94 at rest 8. CAUSE:  "What do you think is causing the dizziness?"     Increase in activity; has not taken B/P meds per parameters 9. RECURRENT SYMPTOM: "Have you had dizziness before?" If so, ask: "When was the last time?" "What happened that time?"     Several months ago when b/p low after surgery. 10. OTHER SYMPTOMS: "Do you have any other symptoms?" (e.g., fever, chest pain, vomiting, diarrhea, bleeding)      no  Protocols used: DIZZINESS [facility]

## 2017-01-07 DIAGNOSIS — M25562 Pain in left knee: Secondary | ICD-10-CM | POA: Diagnosis not present

## 2017-01-07 DIAGNOSIS — M25662 Stiffness of left knee, not elsewhere classified: Secondary | ICD-10-CM | POA: Diagnosis not present

## 2017-01-07 DIAGNOSIS — Z96652 Presence of left artificial knee joint: Secondary | ICD-10-CM | POA: Diagnosis not present

## 2017-01-09 ENCOUNTER — Encounter: Payer: Self-pay | Admitting: Family Medicine

## 2017-01-12 ENCOUNTER — Inpatient Hospital Stay: Payer: Medicare Other | Attending: Oncology

## 2017-01-12 ENCOUNTER — Other Ambulatory Visit: Payer: Medicare Other

## 2017-01-12 DIAGNOSIS — N189 Chronic kidney disease, unspecified: Secondary | ICD-10-CM | POA: Diagnosis not present

## 2017-01-12 DIAGNOSIS — D638 Anemia in other chronic diseases classified elsewhere: Secondary | ICD-10-CM

## 2017-01-12 DIAGNOSIS — D631 Anemia in chronic kidney disease: Secondary | ICD-10-CM | POA: Diagnosis not present

## 2017-01-12 LAB — CBC WITH DIFFERENTIAL/PLATELET
BASOS ABS: 0.1 10*3/uL (ref 0–0.1)
BASOS PCT: 1 %
EOS ABS: 0.2 10*3/uL (ref 0–0.7)
Eosinophils Relative: 2 %
HEMATOCRIT: 28.5 % — AB (ref 40.0–52.0)
Hemoglobin: 9 g/dL — ABNORMAL LOW (ref 13.0–18.0)
Lymphocytes Relative: 21 %
Lymphs Abs: 1.7 10*3/uL (ref 1.0–3.6)
MCH: 26.3 pg (ref 26.0–34.0)
MCHC: 31.6 g/dL — AB (ref 32.0–36.0)
MCV: 83.1 fL (ref 80.0–100.0)
MONO ABS: 0.7 10*3/uL (ref 0.2–1.0)
MONOS PCT: 8 %
NEUTROS ABS: 5.5 10*3/uL (ref 1.4–6.5)
NEUTROS PCT: 68 %
Platelets: 532 10*3/uL — ABNORMAL HIGH (ref 150–440)
RBC: 3.43 MIL/uL — ABNORMAL LOW (ref 4.40–5.90)
RDW: 16.9 % — AB (ref 11.5–14.5)
WBC: 8.1 10*3/uL (ref 3.8–10.6)

## 2017-01-13 ENCOUNTER — Encounter: Payer: Self-pay | Admitting: Family Medicine

## 2017-01-13 DIAGNOSIS — M25562 Pain in left knee: Secondary | ICD-10-CM | POA: Diagnosis not present

## 2017-01-13 DIAGNOSIS — Z96652 Presence of left artificial knee joint: Secondary | ICD-10-CM | POA: Diagnosis not present

## 2017-01-13 DIAGNOSIS — M25662 Stiffness of left knee, not elsewhere classified: Secondary | ICD-10-CM | POA: Diagnosis not present

## 2017-01-15 DIAGNOSIS — Z96652 Presence of left artificial knee joint: Secondary | ICD-10-CM | POA: Diagnosis not present

## 2017-01-15 DIAGNOSIS — M25562 Pain in left knee: Secondary | ICD-10-CM | POA: Diagnosis not present

## 2017-01-15 DIAGNOSIS — M25662 Stiffness of left knee, not elsewhere classified: Secondary | ICD-10-CM | POA: Diagnosis not present

## 2017-01-16 ENCOUNTER — Telehealth: Payer: Self-pay | Admitting: *Deleted

## 2017-01-16 NOTE — Telephone Encounter (Signed)
I would not worry about the high platelet count. It is likely reactive post surgery. Not because of eliquis. Prior platelet counts have been normal. It will come down with time

## 2017-01-16 NOTE — Telephone Encounter (Signed)
Asking for results of CBC. Patient was started on Eliquis for clot s/p knee surgery. Asking if that would cause plt cnt to be elevated  Dx:  Anemia of chronic disease; Anemia of ...   Ref Range & Units 4d ago  WBC 3.8 - 10.6 K/uL 8.1   RBC 4.40 - 5.90 MIL/uL 3.43 Abnormally low    Hemoglobin 13.0 - 18.0 g/dL 9.0 Abnormally low    HCT 40.0 - 52.0 % 28.5 Abnormally low    MCV 80.0 - 100.0 fL 83.1   MCH 26.0 - 34.0 pg 26.3   MCHC 32.0 - 36.0 g/dL 31.6 Abnormally low    RDW 11.5 - 14.5 % 16.9 Abnormally high    Platelets 150 - 440 K/uL 532 Abnormally high    Neutrophils Relative % % 68   Neutro Abs 1.4 - 6.5 K/uL 5.5   Lymphocytes Relative % 21   Lymphs Abs 1.0 - 3.6 K/uL 1.7   Monocytes Relative % 8   Monocytes Absolute 0.2 - 1.0 K/uL 0.7   Eosinophils Relative % 2   Eosinophils Absolute 0 - 0.7 K/uL 0.2   Basophils Relative % 1   Basophils Absolute 0 - 0.1 K/uL 0.1   Comment: Performed at [facility], [address] [place], [place] [address]  Resulting Agency  [facility]      Specimen Collected: 01/12/17 13:08 Last Resulted: 01/12/17 13:13

## 2017-01-16 NOTE — Telephone Encounter (Signed)
Patient informed of doctor response 

## 2017-01-19 ENCOUNTER — Encounter: Payer: Self-pay | Admitting: Family Medicine

## 2017-01-19 ENCOUNTER — Ambulatory Visit (INDEPENDENT_AMBULATORY_CARE_PROVIDER_SITE_OTHER): Payer: Medicare Other | Admitting: Family Medicine

## 2017-01-19 VITALS — BP 110/70 | HR 74 | Resp 16 | Ht 74.0 in | Wt 250.9 lb

## 2017-01-19 DIAGNOSIS — I824Z2 Acute embolism and thrombosis of unspecified deep veins of left distal lower extremity: Secondary | ICD-10-CM | POA: Diagnosis not present

## 2017-01-19 DIAGNOSIS — I10 Essential (primary) hypertension: Secondary | ICD-10-CM

## 2017-01-19 DIAGNOSIS — N183 Chronic kidney disease, stage 3 unspecified: Secondary | ICD-10-CM

## 2017-01-19 DIAGNOSIS — E875 Hyperkalemia: Secondary | ICD-10-CM | POA: Diagnosis not present

## 2017-01-19 DIAGNOSIS — Z96652 Presence of left artificial knee joint: Secondary | ICD-10-CM | POA: Diagnosis not present

## 2017-01-19 DIAGNOSIS — J4540 Moderate persistent asthma, uncomplicated: Secondary | ICD-10-CM | POA: Diagnosis not present

## 2017-01-19 DIAGNOSIS — M25662 Stiffness of left knee, not elsewhere classified: Secondary | ICD-10-CM | POA: Diagnosis not present

## 2017-01-19 DIAGNOSIS — R739 Hyperglycemia, unspecified: Secondary | ICD-10-CM

## 2017-01-19 DIAGNOSIS — R748 Abnormal levels of other serum enzymes: Secondary | ICD-10-CM | POA: Diagnosis not present

## 2017-01-19 DIAGNOSIS — E785 Hyperlipidemia, unspecified: Secondary | ICD-10-CM | POA: Diagnosis not present

## 2017-01-19 DIAGNOSIS — M25562 Pain in left knee: Secondary | ICD-10-CM | POA: Diagnosis not present

## 2017-01-19 DIAGNOSIS — M170 Bilateral primary osteoarthritis of knee: Secondary | ICD-10-CM | POA: Diagnosis not present

## 2017-01-19 MED ORDER — ELIQUIS 5 MG PO TABS: 5.0000 mg | ORAL_TABLET | Freq: Two times a day (BID) | ORAL | 1 refills | Status: DC

## 2017-01-19 MED ORDER — MONTELUKAST SODIUM 10 MG PO TABS: 10.0000 mg | ORAL_TABLET | Freq: Every day | ORAL | 5 refills | Status: DC

## 2017-01-19 NOTE — Progress Notes (Signed)
Name: [name]   MRN: [id number]    DOB: 1950/05/24   Date:01/19/2017       Progress Note  Subjective  Chief Complaint  Chief Complaint  Patient presents with  . Hypertension  . Asthma    HPI  HTN: bp has normalized since adrenal gland removed, bp has been under control at home, and has been off losartan.   Obese: he has lost almost 20 lbs since March 2018, he has a history of hyperaldosteronism. He feels like since surgery not as hungry. Eating healthy, weight is stable since last visit Nov 2018  Asthma Moderate: he is using Breo and singulair daily, Proai prn only, occasionally for chest tightness and it improves symptoms. Currently has mild chest congestion from a recent URI, no fever or chills, no wheezing.   AR: stable with medication, no sneezing, nasal congestion, mild intermittent rhinorrhea and occasionally takes allegra D  Knee pain: he had right knee replacement June 2018, and left knee replacement 12/19/2016, he developed a DVT of left lower leg within the first week of post-op and has been taking Eliquis as prescribed by Ortho, but I need to follow from now on. No side effects except for easy bruising. He states calf pain has resolved, back on PT, still using a cane and unable to flex left leg at this time.   BPH: taking Cialis and Flomax, doing well on medication    Patient Active Problem List   Diagnosis Date Noted  . Primary osteoarthritis of left knee 12/19/2016  . Anemia, unspecified 04/30/2016  . History of benign neoplasm of adrenal gland 02/11/2016  . History of iron deficiency anemia 10/10/2015  . BPH (benign prostatic hyperplasia) 06/20/2015  . ED (erectile dysfunction) 06/20/2015  . Allergic rhinitis, seasonal 06/20/2015  . Anemia of chronic disease 06/20/2015  . Hypogonadism in male 06/20/2015  . Asthma, well controlled, moderate persistent 06/20/2015  . Hypertension, benign 06/20/2015  . Hyperglycemia 06/20/2015  . History of  shingles 06/20/2015  . GERD without esophagitis 06/20/2015  . Chronic radicular low back pain 06/20/2015  . History of epilepsy 06/20/2015  . Migraine without aura and without status migrainosus, not intractable 06/20/2015  . Dyslipidemia 06/20/2015  . Primary osteoarthritis of both knees 06/20/2015    Past Surgical History:  Procedure Laterality Date  . ADRENALECTOMY Left 02/11/2016   [facility]  . COLONOSCOPY  02/2012   normal  . JOINT REPLACEMENT    . KNEE ARTHROSCOPY Left 10/06/2009  . SINUS EXPLORATION    . TOTAL KNEE ARTHROPLASTY Right 06/20/2016  . TOTAL KNEE ARTHROPLASTY Right 06/20/2016   Procedure: TOTAL KNEE ARTHROPLASTY;  Surgeon: Dorna Leitz, MD;  Location: [place];  Service: Orthopedics;  Laterality: Right;  . TOTAL KNEE ARTHROPLASTY Left 12/19/2016   Procedure: LEFT TOTAL KNEE ARTHROPLASTY;  Surgeon: Dorna Leitz, MD;  Location: [facility];  Service: Orthopedics;  Laterality: Left;  Adductor Block    Family History  Problem Relation Age of Onset  . Diabetes Mother   . Heart disease Mother   . Lung disease Mother   . Seizures Maternal Grandmother     Social History   Socioeconomic History  . Marital status: Married    Spouse name: Not on file  . Number of children: Not on file  . Years of education: Not on file  . Highest education level: Not on file  Social Needs  . Financial resource strain: Not on file  . Food insecurity - worry: Not on file  . Food  insecurity - inability: Not on file  . Transportation needs - medical: Not on file  . Transportation needs - non-medical: Not on file  Occupational History  . Not on file  Tobacco Use  . Smoking status: Former Smoker    Packs/day: 1.00    Years: 10.00    Pack years: 10.00  . Smokeless tobacco: Never Used  . Tobacco comment: 38 years ago 41 when he stopped  Substance and Sexual Activity  . Alcohol use: No    Alcohol/week: 0.0 oz  . Drug use: No  . Sexual activity: Yes    Partners: Female  Other Topics Concern   . Not on file  Social History Narrative  . Not on file     Current Outpatient Medications:  .  albuterol (PROAIR HFA) 108 (90 Base) MCG/ACT inhaler, Inhale 2 puffs into the lungs every 6 (six) hours as needed for wheezing or shortness of breath., Disp: 1 Inhaler, Rfl: 0 .  azelastine (OPTIVAR) 0.05 % ophthalmic solution, INSTILL 2 DROPS INTO BOTH EYES TWICE A DAY, Disp: 6 mL, Rfl: 5 .  CIALIS 5 MG tablet, Take 1 tablet (5 mg total) by mouth daily., Disp: 30 tablet, Rfl: 5 .  docusate sodium (COLACE) 100 MG capsule, Take 1 capsule (100 mg total) by mouth 2 (two) times daily. (Patient taking differently: Take 100 mg by mouth daily. ), Disp: 30 capsule, Rfl: 0 .  ELIQUIS 5 MG TABS tablet, Take 1 tablet by mouth 2 (two) times daily., Disp: , Rfl: 1 .  ferrous sulfate 325 (65 FE) MG tablet, Take 325 mg by mouth 2 (two) times daily with a meal. , Disp: , Rfl:  .  fexofenadine (ALLEGRA) 180 MG tablet, Take 180 mg by mouth daily as needed for allergies or rhinitis., Disp: , Rfl:  .  fexofenadine-pseudoephedrine (ALLEGRA-D 24) 180-240 MG 24 hr tablet, Take 1 tablet by mouth daily as needed (for allergies.)., Disp: , Rfl:  .  fluticasone (FLONASE) 50 MCG/ACT nasal spray, Place 2 sprays into both nostrils daily., Disp: 48 g, Rfl: 1 .  fluticasone furoate-vilanterol (BREO ELLIPTA) 200-25 MCG/INH AEPB, Inhale 1 puff into the lungs daily., Disp: 60 each, Rfl: 5 .  Glucosamine-MSM-Hyaluronic Acd (JOINT HEALTH PO), Take 1 tablet by mouth daily. INSTAFLEX ADVANCED JOINT SUPPORT, Disp: , Rfl:  .  Misc Natural Products (NF FORMULAS TESTOSTERONE PO), Take 2 tablets by mouth daily. NATURE'S PLUS T-MALE SUPPLEMENT, Disp: , Rfl:  .  montelukast (SINGULAIR) 10 MG tablet, Take 1 tablet (10 mg total) daily by mouth., Disp: 30 tablet, Rfl: 2 .  Multiple Vitamins-Minerals (MULTIVITAMIN ADULTS 50+ PO), Take 1 Package by mouth daily. NATURE'S CODE MEN OVER 50 MULTIVITAMIN PACK, Disp: , Rfl:  .  OVER THE COUNTER MEDICATION,  Take 1 tablet by mouth daily. TEST-HD TESTOSTERONE SUPPORT, Disp: , Rfl:  .  oxyCODONE-acetaminophen (PERCOCET/ROXICET) 5-325 MG tablet, Take 1-2 tablets by mouth every 6 (six) hours as needed for severe pain., Disp: 60 tablet, Rfl: 0 .  patiromer (VELTASSA) 8.4 g packet, Take 8.4 g by mouth at bedtime. , Disp: , Rfl:  .  Polyethyl Glycol-Propyl Glycol (SYSTANE ULTRA) 0.4-0.3 % SOLN, Place 1-2 drops into both eyes 3 (three) times daily as needed (for dry/irritated eyes.)., Disp: , Rfl:  .  tamsulosin (FLOMAX) 0.4 MG CAPS capsule, Take 1 capsule (0.4 mg total) by mouth every evening., Disp: 30 capsule, Rfl: 5 .  tiZANidine (ZANAFLEX) 2 MG tablet, Take 1 tablet (2 mg total) by mouth every 8 (  eight) hours as needed for muscle spasms., Disp: 50 tablet, Rfl: 0 .  traMADol (ULTRAM) 50 MG tablet, Take 1-2 tablets (50-100 mg total) by mouth every 6 (six) hours as needed (for pain)., Disp: 60 tablet, Rfl: 0  Allergies  Allergen Reactions  . Lactose Intolerance (Gi) Other (See Comments)    MIGRAINES  . Shellfish Allergy Other (See Comments)    Congestion/breathing problems/migraines.     ROS  Ten systems reviewed and is negative except as mentioned in HPI   Objective  Vitals:   01/19/17 1129  BP: 110/70  Pulse: 74  Resp: 16  SpO2: 96%  Weight: 250 lb 14.4 oz (113.8 kg)  Height: 6' 2"  (1.88 m)    Body mass index is 32.21 kg/m.  Physical Exam  Constitutional: Patient appears well-developed and well-nourished. Obese No distress.  HEENT: head atraumatic, normocephalic, pupils equal and reactive to light,  neck supple, throat within normal limits Cardiovascular: Normal rate, regular rhythm and normal heart sounds.  No murmur heard. No BLE edema. Pulmonary/Chest: Effort normal and breath sounds normal. No respiratory distress. Abdominal: Soft.  There is no tenderness. Psychiatric: Patient has a normal mood and affect. behavior is normal. Judgment and thought content normal. Muscular  Skeletal: using a cane, decrease rom of left knee   Recent Results (from the past 2160 hour(s))  Comprehensive metabolic panel     Status: Abnormal   Collection Time: 12/08/16  1:30 PM  Result Value Ref Range   Sodium 134 (L) 135 - 145 mmol/L   Potassium 4.7 3.5 - 5.1 mmol/L   Chloride 96 (L) 101 - 111 mmol/L   CO2 30 22 - 32 mmol/L   Glucose, Bld 98 65 - 99 mg/dL   BUN 25 (H) 6 - 20 mg/dL   Creatinine, Ser 1.52 (H) 0.61 - 1.24 mg/dL   Calcium 9.3 8.9 - 10.3 mg/dL   Total Protein 7.8 6.5 - 8.1 g/dL   Albumin 3.8 3.5 - 5.0 g/dL   AST 26 15 - 41 U/L   ALT 18 17 - 63 U/L   Alkaline Phosphatase 76 38 - 126 U/L   Total Bilirubin 0.7 0.3 - 1.2 mg/dL   GFR calc non Af Amer 46 (L) >60 mL/min   GFR calc Af Amer 54 (L) >60 mL/min    Comment: (NOTE) The eGFR has been calculated using the CKD EPI equation. This calculation has not been validated in all clinical situations. eGFR's persistently <60 mL/min signify possible Chronic Kidney Disease.    Anion gap 8 5 - 15  CBC with Differential     Status: Abnormal   Collection Time: 12/08/16  1:30 PM  Result Value Ref Range   WBC 4.9 3.8 - 10.6 K/uL   RBC 3.91 (L) 4.40 - 5.90 MIL/uL   Hemoglobin 10.4 (L) 13.0 - 18.0 g/dL   HCT 32.1 (L) 40.0 - 52.0 %   MCV 82.1 80.0 - 100.0 fL   MCH 26.6 26.0 - 34.0 pg   MCHC 32.4 32.0 - 36.0 g/dL   RDW 16.8 (H) 11.5 - 14.5 %   Platelets 247 150 - 440 K/uL   Neutrophils Relative % 53 %   Neutro Abs 2.6 1.4 - 6.5 K/uL   Lymphocytes Relative 34 %   Lymphs Abs 1.7 1.0 - 3.6 K/uL   Monocytes Relative 10 %   Monocytes Absolute 0.5 0.2 - 1.0 K/uL   Eosinophils Relative 2 %   Eosinophils Absolute 0.1 0 - 0.7 K/uL  Basophils Relative 1 %   Basophils Absolute 0.1 0 - 0.1 K/uL  Urinalysis, Routine w reflex microscopic     Status: Abnormal   Collection Time: 12/15/16  1:45 PM  Result Value Ref Range   Color, Urine AMBER (A) YELLOW    Comment: BIOCHEMICALS MAY BE AFFECTED BY COLOR   APPearance HAZY (A)  CLEAR   Specific Gravity, Urine 1.025 1.005 - 1.030   pH 5.0 5.0 - 8.0   Glucose, UA NEGATIVE NEGATIVE mg/dL   Hgb urine dipstick NEGATIVE NEGATIVE   Bilirubin Urine NEGATIVE NEGATIVE   Ketones, ur NEGATIVE NEGATIVE mg/dL   Protein, ur NEGATIVE NEGATIVE mg/dL   Nitrite NEGATIVE NEGATIVE   Leukocytes, UA NEGATIVE NEGATIVE  Surgical pcr screen     Status: None   Collection Time: 12/15/16  1:55 PM  Result Value Ref Range   MRSA, PCR NEGATIVE NEGATIVE   Staphylococcus aureus NEGATIVE NEGATIVE    Comment: (NOTE) The Xpert SA Assay (FDA approved for NASAL specimens in patients 39 years of age and older), is one component of a comprehensive surveillance program. It is not intended to diagnose infection nor to guide or monitor treatment.   APTT     Status: None   Collection Time: 12/15/16  2:35 PM  Result Value Ref Range   aPTT 30 24 - 36 seconds  CBC WITH DIFFERENTIAL     Status: Abnormal   Collection Time: 12/15/16  2:35 PM  Result Value Ref Range   WBC 6.5 4.0 - 10.5 K/uL   RBC 4.11 (L) 4.22 - 5.81 MIL/uL   Hemoglobin 10.9 (L) 13.0 - 17.0 g/dL   HCT 34.5 (L) 39.0 - 52.0 %   MCV 83.9 78.0 - 100.0 fL   MCH 26.5 26.0 - 34.0 pg   MCHC 31.6 30.0 - 36.0 g/dL   RDW 16.0 (H) 11.5 - 15.5 %   Platelets 272 150 - 400 K/uL   Neutrophils Relative % 60 %   Neutro Abs 3.9 1.7 - 7.7 K/uL   Lymphocytes Relative 28 %   Lymphs Abs 1.8 0.7 - 4.0 K/uL   Monocytes Relative 8 %   Monocytes Absolute 0.5 0.1 - 1.0 K/uL   Eosinophils Relative 3 %   Eosinophils Absolute 0.2 0.0 - 0.7 K/uL   Basophils Relative 1 %   Basophils Absolute 0.0 0.0 - 0.1 K/uL  Comprehensive metabolic panel     Status: Abnormal   Collection Time: 12/15/16  2:35 PM  Result Value Ref Range   Sodium 138 135 - 145 mmol/L   Potassium 4.2 3.5 - 5.1 mmol/L   Chloride 106 101 - 111 mmol/L   CO2 25 22 - 32 mmol/L   Glucose, Bld 117 (H) 65 - 99 mg/dL   BUN 33 (H) 6 - 20 mg/dL   Creatinine, Ser 1.50 (H) 0.61 - 1.24 mg/dL    Calcium 9.5 8.9 - 10.3 mg/dL   Total Protein 7.5 6.5 - 8.1 g/dL   Albumin 3.9 3.5 - 5.0 g/dL   AST 28 15 - 41 U/L   ALT 18 17 - 63 U/L   Alkaline Phosphatase 86 38 - 126 U/L   Total Bilirubin 0.7 0.3 - 1.2 mg/dL   GFR calc non Af Amer 47 (L) >60 mL/min   GFR calc Af Amer 55 (L) >60 mL/min    Comment: (NOTE) The eGFR has been calculated using the CKD EPI equation. This calculation has not been validated in all clinical situations. eGFR's persistently <60 mL/min  signify possible Chronic Kidney Disease.    Anion gap 7 5 - 15  Protime-INR     Status: None   Collection Time: 12/15/16  2:35 PM  Result Value Ref Range   Prothrombin Time 12.6 11.4 - 15.2 seconds   INR 0.95   Type and screen Order type and screen if day of surgery is less than 15 days from draw of preadmission visit or order morning of surgery if day of surgery is greater than 6 days from preadmission visit.     Status: None   Collection Time: 12/15/16  2:36 PM  Result Value Ref Range   ABO/RH(D) O POS    Antibody Screen NEG    Sample Expiration 12/22/2016    Extend sample reason NO TRANSFUSIONS OR PREGNANCY IN THE PAST 3 MONTHS   ABO/Rh     Status: None   Collection Time: 12/15/16  2:36 PM  Result Value Ref Range   ABO/RH(D) O POS   CBC     Status: Abnormal   Collection Time: 12/20/16  5:02 AM  Result Value Ref Range   WBC 11.4 (H) 4.0 - 10.5 K/uL   RBC 3.94 (L) 4.22 - 5.81 MIL/uL   Hemoglobin 10.5 (L) 13.0 - 17.0 g/dL   HCT 32.7 (L) 39.0 - 52.0 %   MCV 83.0 78.0 - 100.0 fL   MCH 26.6 26.0 - 34.0 pg   MCHC 32.1 30.0 - 36.0 g/dL   RDW 15.7 (H) 11.5 - 15.5 %   Platelets 247 150 - 400 K/uL  Basic metabolic panel     Status: Abnormal   Collection Time: 12/20/16  5:02 AM  Result Value Ref Range   Sodium 137 135 - 145 mmol/L   Potassium 4.6 3.5 - 5.1 mmol/L   Chloride 103 101 - 111 mmol/L   CO2 24 22 - 32 mmol/L   Glucose, Bld 157 (H) 65 - 99 mg/dL   BUN 24 (H) 6 - 20 mg/dL   Creatinine, Ser 1.31 (H) 0.61 -  1.24 mg/dL   Calcium 9.3 8.9 - 10.3 mg/dL   GFR calc non Af Amer 56 (L) >60 mL/min   GFR calc Af Amer >60 >60 mL/min    Comment: (NOTE) The eGFR has been calculated using the CKD EPI equation. This calculation has not been validated in all clinical situations. eGFR's persistently <60 mL/min signify possible Chronic Kidney Disease.    Anion gap 10 5 - 15  Basic metabolic panel     Status: Abnormal   Collection Time: 12/20/16  9:13 AM  Result Value Ref Range   Sodium 135 135 - 145 mmol/L   Potassium 4.2 3.5 - 5.1 mmol/L   Chloride 103 101 - 111 mmol/L   CO2 23 22 - 32 mmol/L   Glucose, Bld 136 (H) 65 - 99 mg/dL   BUN 24 (H) 6 - 20 mg/dL   Creatinine, Ser 1.43 (H) 0.61 - 1.24 mg/dL   Calcium 9.1 8.9 - 10.3 mg/dL   GFR calc non Af Amer 50 (L) >60 mL/min   GFR calc Af Amer 58 (L) >60 mL/min    Comment: (NOTE) The eGFR has been calculated using the CKD EPI equation. This calculation has not been validated in all clinical situations. eGFR's persistently <60 mL/min signify possible Chronic Kidney Disease.    Anion gap 9 5 - 15  CBC     Status: Abnormal   Collection Time: 12/21/16  4:55 AM  Result Value Ref Range  WBC 14.3 (H) 4.0 - 10.5 K/uL   RBC 3.91 (L) 4.22 - 5.81 MIL/uL   Hemoglobin 10.5 (L) 13.0 - 17.0 g/dL   HCT 32.1 (L) 39.0 - 52.0 %   MCV 82.1 78.0 - 100.0 fL   MCH 26.9 26.0 - 34.0 pg   MCHC 32.7 30.0 - 36.0 g/dL   RDW 15.9 (H) 11.5 - 15.5 %   Platelets 242 150 - 400 K/uL  Basic metabolic panel     Status: Abnormal   Collection Time: 12/21/16  4:55 AM  Result Value Ref Range   Sodium 137 135 - 145 mmol/L   Potassium 4.7 3.5 - 5.1 mmol/L   Chloride 104 101 - 111 mmol/L   CO2 24 22 - 32 mmol/L   Glucose, Bld 143 (H) 65 - 99 mg/dL   BUN 27 (H) 6 - 20 mg/dL   Creatinine, Ser 1.27 (H) 0.61 - 1.24 mg/dL   Calcium 9.1 8.9 - 10.3 mg/dL   GFR calc non Af Amer 58 (L) >60 mL/min   GFR calc Af Amer >60 >60 mL/min    Comment: (NOTE) The eGFR has been calculated using the  CKD EPI equation. This calculation has not been validated in all clinical situations. eGFR's persistently <60 mL/min signify possible Chronic Kidney Disease.    Anion gap 9 5 - 15  CBC     Status: Abnormal   Collection Time: 12/22/16  5:18 AM  Result Value Ref Range   WBC 12.0 (H) 4.0 - 10.5 K/uL   RBC 3.64 (L) 4.22 - 5.81 MIL/uL   Hemoglobin 9.6 (L) 13.0 - 17.0 g/dL   HCT 30.0 (L) 39.0 - 52.0 %   MCV 82.4 78.0 - 100.0 fL   MCH 26.4 26.0 - 34.0 pg   MCHC 32.0 30.0 - 36.0 g/dL   RDW 15.9 (H) 11.5 - 15.5 %   Platelets 216 150 - 400 K/uL  Basic metabolic panel     Status: Abnormal   Collection Time: 01/02/17 12:00 AM  Result Value Ref Range   Glucose 117    BUN 35 (A) 4 - 21   Creatinine 1.8 (A) 0.6 - 1.3   Potassium 5.8 (A) 3.4 - 5.3   Sodium 132 (A) 137 - 147  Hepatic function panel     Status: Abnormal   Collection Time: 01/02/17 12:00 AM  Result Value Ref Range   ALT 119 (A) 10 - 40   AST 68 (A) 14 - 40  CBC with Differential     Status: Abnormal   Collection Time: 01/12/17  1:08 PM  Result Value Ref Range   WBC 8.1 3.8 - 10.6 K/uL   RBC 3.43 (L) 4.40 - 5.90 MIL/uL   Hemoglobin 9.0 (L) 13.0 - 18.0 g/dL   HCT 28.5 (L) 40.0 - 52.0 %   MCV 83.1 80.0 - 100.0 fL   MCH 26.3 26.0 - 34.0 pg   MCHC 31.6 (L) 32.0 - 36.0 g/dL   RDW 16.9 (H) 11.5 - 14.5 %   Platelets 532 (H) 150 - 440 K/uL   Neutrophils Relative % 68 %   Neutro Abs 5.5 1.4 - 6.5 K/uL   Lymphocytes Relative 21 %   Lymphs Abs 1.7 1.0 - 3.6 K/uL   Monocytes Relative 8 %   Monocytes Absolute 0.7 0.2 - 1.0 K/uL   Eosinophils Relative 2 %   Eosinophils Absolute 0.2 0 - 0.7 K/uL   Basophils Relative 1 %   Basophils Absolute 0.1 0 -  0.1 K/uL    Comment: Performed at [facility], [address] [place], [place] [address]      PHQ2/9: Depression screen [facility] 2/9 11/26/2016 10/10/2015 06/20/2015 10/31/2014  Decreased Interest 0 0 0 0  Down, Depressed, Hopeless 0 0 0 0  PHQ - 2 Score 0 0 0 0     Fall  Risk: Fall Risk  01/19/2017 11/26/2016 10/10/2015 06/20/2015  Falls in the past year? No No No No     Functional Status Survey: Is the patient deaf or have difficulty hearing?: No Does the patient have difficulty seeing, even when wearing glasses/contacts?: No Does the patient have difficulty walking or climbing stairs?: Yes Does the patient have difficulty dressing or bathing?: No Does the patient have difficulty doing errands alone such as visiting a doctor's office or shopping?: No    Assessment & Plan  1. Hypertension, benign  Stop blood pressure medication at this time  2. Primary osteoarthritis of both knees  Continue medication   3. Asthma, well controlled, moderate persistent  - montelukast (SINGULAIR) 10 MG tablet; Take 1 tablet (10 mg total) by mouth daily.  Dispense: 30 tablet; Refill: 5  4. Chronic kidney insufficiency, stage 3 (moderate) (HCC)  Continue follow up with Dr. Holley Raring   5. Dyslipidemia  Need to have labs done  - lipid panel   6. Hyperkalemia  Keep follow up with Dr. Holley Raring  7. Lower leg DVT (deep venous thromboembolism), acute, left (HCC)  - DOPPLER VENOUS LEGS BILATERAL; Future - ELIQUIS 5 MG TABS tablet; Take 1 tablet (5 mg total) by mouth 2 (two) times daily.  Dispense: 60 tablet; Refill: 1  8. Elevated liver enzymes  - Hepatic function panel  9. Hyperglycemia  - Hemoglobin A1c

## 2017-01-23 ENCOUNTER — Other Ambulatory Visit: Payer: Self-pay | Admitting: Family Medicine

## 2017-01-23 ENCOUNTER — Telehealth: Payer: Self-pay

## 2017-01-23 DIAGNOSIS — I10 Essential (primary) hypertension: Secondary | ICD-10-CM

## 2017-01-23 NOTE — Telephone Encounter (Signed)
I change to a comp panel that can be done Monday  Tiffany < please cancel hepatic panel

## 2017-01-23 NOTE — Telephone Encounter (Signed)
Copied from [place] [id number]. Topic: General - Other >> Jan 23, 2017  9:25 AM Conception Chancy, NT wrote: Reason for CRM: Anderson Malta from Dr. Billey Chang office is calling and says patient is supposed to have fasting labs on Monday she wants to discuss with Dr. Ancil Boozer nurse about adding a BMP lab and sending them the results. She would like a call back. CB [phone] ext [phone]  Fax [phone] >> Jan 23, 2017  9:43 AM Aurelio Brash B wrote: Pt's wife, Neoma Laming, called to say Anderson Malta,  Dr. Mitzi Davenport nurse,  leaves at 2 today, and wanted to make sure this was addressed by that time.

## 2017-01-26 DIAGNOSIS — I10 Essential (primary) hypertension: Secondary | ICD-10-CM | POA: Diagnosis not present

## 2017-01-26 DIAGNOSIS — R739 Hyperglycemia, unspecified: Secondary | ICD-10-CM | POA: Diagnosis not present

## 2017-01-26 DIAGNOSIS — E785 Hyperlipidemia, unspecified: Secondary | ICD-10-CM | POA: Diagnosis not present

## 2017-01-26 NOTE — Telephone Encounter (Signed)
Anderson Malta from Dr. Holley Raring office was notified that Dr. Ancil Boozer will add on the BMP to his blood work to be done on Monday and will fax a copy to them once his labs are back. Also patient was notified about [place] adding on this test to his blood work by phone at 8:41 a.m. On 01/26/17. I also cancelled the Hepatic Panel per Dr. Ancil Boozer request. Labs are upfront ready for patient to be drawn.

## 2017-01-26 NOTE — Addendum Note (Signed)
Addended by: Inda Coke on: 01/26/2017 08:39 AM   Modules accepted: Orders

## 2017-01-27 DIAGNOSIS — M25562 Pain in left knee: Secondary | ICD-10-CM | POA: Diagnosis not present

## 2017-01-27 DIAGNOSIS — M25662 Stiffness of left knee, not elsewhere classified: Secondary | ICD-10-CM | POA: Diagnosis not present

## 2017-01-27 DIAGNOSIS — Z96652 Presence of left artificial knee joint: Secondary | ICD-10-CM | POA: Diagnosis not present

## 2017-01-27 LAB — COMPLETE METABOLIC PANEL WITH GFR
AG Ratio: 1.2 (calc) (ref 1.0–2.5)
ALKALINE PHOSPHATASE (APISO): 100 U/L (ref 40–115)
ALT: 14 U/L (ref 9–46)
AST: 16 U/L (ref 10–35)
Albumin: 3.9 g/dL (ref 3.6–5.1)
BILIRUBIN TOTAL: 0.4 mg/dL (ref 0.2–1.2)
BUN/Creatinine Ratio: 19 (calc) (ref 6–22)
BUN: 27 mg/dL — ABNORMAL HIGH (ref 7–25)
CHLORIDE: 103 mmol/L (ref 98–110)
CO2: 28 mmol/L (ref 20–32)
Calcium: 9.6 mg/dL (ref 8.6–10.3)
Creat: 1.4 mg/dL — ABNORMAL HIGH (ref 0.70–1.25)
GFR, Est African American: 61 mL/min/{1.73_m2} (ref >=60)
GFR, Est Non African American: 52 mL/min/{1.73_m2} — ABNORMAL LOW (ref >=60)
GLUCOSE: 107 mg/dL — AB (ref 65–99)
Globulin: 3.2 g/dL (calc) (ref 1.9–3.7)
Potassium: 4.6 mmol/L (ref 3.5–5.3)
Sodium: 139 mmol/L (ref 135–146)
TOTAL PROTEIN: 7.1 g/dL (ref 6.1–8.1)

## 2017-01-27 LAB — LIPID PANEL
Cholesterol: 189 mg/dL (ref <200)
HDL: 48 mg/dL (ref >40)
LDL CHOLESTEROL (CALC): 118 mg/dL — AB
NON-HDL CHOLESTEROL (CALC): 141 mg/dL — AB (ref <130)
TRIGLYCERIDES: 122 mg/dL (ref <150)
Total CHOL/HDL Ratio: 3.9 (calc) (ref <5.0)

## 2017-01-27 LAB — HEMOGLOBIN A1C
EAG (MMOL/L): 5.4 (calc)
HEMOGLOBIN A1C: 5 %{Hb} (ref <5.7)
Mean Plasma Glucose: 97 (calc)

## 2017-01-28 DIAGNOSIS — Z96652 Presence of left artificial knee joint: Secondary | ICD-10-CM | POA: Diagnosis not present

## 2017-01-28 DIAGNOSIS — M25662 Stiffness of left knee, not elsewhere classified: Secondary | ICD-10-CM | POA: Diagnosis not present

## 2017-01-28 DIAGNOSIS — M25562 Pain in left knee: Secondary | ICD-10-CM | POA: Diagnosis not present

## 2017-01-29 DIAGNOSIS — M25562 Pain in left knee: Secondary | ICD-10-CM | POA: Diagnosis not present

## 2017-01-29 DIAGNOSIS — M25662 Stiffness of left knee, not elsewhere classified: Secondary | ICD-10-CM | POA: Diagnosis not present

## 2017-01-29 DIAGNOSIS — Z96652 Presence of left artificial knee joint: Secondary | ICD-10-CM | POA: Diagnosis not present

## 2017-02-02 DIAGNOSIS — M25662 Stiffness of left knee, not elsewhere classified: Secondary | ICD-10-CM | POA: Diagnosis not present

## 2017-02-02 DIAGNOSIS — Z96652 Presence of left artificial knee joint: Secondary | ICD-10-CM | POA: Diagnosis not present

## 2017-02-02 DIAGNOSIS — M25562 Pain in left knee: Secondary | ICD-10-CM | POA: Diagnosis not present

## 2017-02-03 DIAGNOSIS — T8489XA Other specified complication of internal orthopedic prosthetic devices, implants and grafts, initial encounter: Secondary | ICD-10-CM | POA: Diagnosis not present

## 2017-02-04 DIAGNOSIS — N183 Chronic kidney disease, stage 3 (moderate): Secondary | ICD-10-CM | POA: Diagnosis not present

## 2017-02-04 DIAGNOSIS — D631 Anemia in chronic kidney disease: Secondary | ICD-10-CM | POA: Diagnosis not present

## 2017-02-04 DIAGNOSIS — I10 Essential (primary) hypertension: Secondary | ICD-10-CM | POA: Diagnosis not present

## 2017-02-04 DIAGNOSIS — E875 Hyperkalemia: Secondary | ICD-10-CM | POA: Diagnosis not present

## 2017-02-09 DIAGNOSIS — M24662 Ankylosis, left knee: Secondary | ICD-10-CM | POA: Diagnosis not present

## 2017-02-09 DIAGNOSIS — M25662 Stiffness of left knee, not elsewhere classified: Secondary | ICD-10-CM | POA: Diagnosis not present

## 2017-02-09 DIAGNOSIS — Z96652 Presence of left artificial knee joint: Secondary | ICD-10-CM | POA: Diagnosis not present

## 2017-02-09 DIAGNOSIS — G8918 Other acute postprocedural pain: Secondary | ICD-10-CM | POA: Diagnosis not present

## 2017-02-10 DIAGNOSIS — Z96652 Presence of left artificial knee joint: Secondary | ICD-10-CM | POA: Diagnosis not present

## 2017-02-10 DIAGNOSIS — M25562 Pain in left knee: Secondary | ICD-10-CM | POA: Diagnosis not present

## 2017-02-10 DIAGNOSIS — M25662 Stiffness of left knee, not elsewhere classified: Secondary | ICD-10-CM | POA: Diagnosis not present

## 2017-02-12 DIAGNOSIS — Z96652 Presence of left artificial knee joint: Secondary | ICD-10-CM | POA: Diagnosis not present

## 2017-02-12 DIAGNOSIS — M25562 Pain in left knee: Secondary | ICD-10-CM | POA: Diagnosis not present

## 2017-02-12 DIAGNOSIS — M25662 Stiffness of left knee, not elsewhere classified: Secondary | ICD-10-CM | POA: Diagnosis not present

## 2017-02-13 DIAGNOSIS — M25662 Stiffness of left knee, not elsewhere classified: Secondary | ICD-10-CM | POA: Diagnosis not present

## 2017-02-13 DIAGNOSIS — M25562 Pain in left knee: Secondary | ICD-10-CM | POA: Diagnosis not present

## 2017-02-13 DIAGNOSIS — Z96652 Presence of left artificial knee joint: Secondary | ICD-10-CM | POA: Diagnosis not present

## 2017-02-16 DIAGNOSIS — M25562 Pain in left knee: Secondary | ICD-10-CM | POA: Diagnosis not present

## 2017-02-16 DIAGNOSIS — M25662 Stiffness of left knee, not elsewhere classified: Secondary | ICD-10-CM | POA: Diagnosis not present

## 2017-02-16 DIAGNOSIS — Z96652 Presence of left artificial knee joint: Secondary | ICD-10-CM | POA: Diagnosis not present

## 2017-02-17 DIAGNOSIS — Z9889 Other specified postprocedural states: Secondary | ICD-10-CM | POA: Diagnosis not present

## 2017-02-18 DIAGNOSIS — M25662 Stiffness of left knee, not elsewhere classified: Secondary | ICD-10-CM | POA: Diagnosis not present

## 2017-02-18 DIAGNOSIS — M25562 Pain in left knee: Secondary | ICD-10-CM | POA: Diagnosis not present

## 2017-02-18 DIAGNOSIS — Z96652 Presence of left artificial knee joint: Secondary | ICD-10-CM | POA: Diagnosis not present

## 2017-02-19 DIAGNOSIS — M25562 Pain in left knee: Secondary | ICD-10-CM | POA: Diagnosis not present

## 2017-02-19 DIAGNOSIS — M25662 Stiffness of left knee, not elsewhere classified: Secondary | ICD-10-CM | POA: Diagnosis not present

## 2017-02-19 DIAGNOSIS — Z96652 Presence of left artificial knee joint: Secondary | ICD-10-CM | POA: Diagnosis not present

## 2017-02-20 DIAGNOSIS — M25562 Pain in left knee: Secondary | ICD-10-CM | POA: Diagnosis not present

## 2017-02-20 DIAGNOSIS — Z96652 Presence of left artificial knee joint: Secondary | ICD-10-CM | POA: Diagnosis not present

## 2017-02-20 DIAGNOSIS — M25662 Stiffness of left knee, not elsewhere classified: Secondary | ICD-10-CM | POA: Diagnosis not present

## 2017-02-23 DIAGNOSIS — M25662 Stiffness of left knee, not elsewhere classified: Secondary | ICD-10-CM | POA: Diagnosis not present

## 2017-02-23 DIAGNOSIS — M25562 Pain in left knee: Secondary | ICD-10-CM | POA: Diagnosis not present

## 2017-02-23 DIAGNOSIS — Z96652 Presence of left artificial knee joint: Secondary | ICD-10-CM | POA: Diagnosis not present

## 2017-02-25 ENCOUNTER — Other Ambulatory Visit: Payer: Self-pay | Admitting: *Deleted

## 2017-02-25 ENCOUNTER — Telehealth: Payer: Self-pay | Admitting: *Deleted

## 2017-02-25 DIAGNOSIS — Z96652 Presence of left artificial knee joint: Secondary | ICD-10-CM | POA: Diagnosis not present

## 2017-02-25 DIAGNOSIS — Z862 Personal history of diseases of the blood and blood-forming organs and certain disorders involving the immune mechanism: Secondary | ICD-10-CM

## 2017-02-25 DIAGNOSIS — M25662 Stiffness of left knee, not elsewhere classified: Secondary | ICD-10-CM | POA: Diagnosis not present

## 2017-02-25 DIAGNOSIS — M25562 Pain in left knee: Secondary | ICD-10-CM | POA: Diagnosis not present

## 2017-02-25 NOTE — Telephone Encounter (Signed)
Wrong chart

## 2017-02-26 ENCOUNTER — Inpatient Hospital Stay: Payer: Medicare Other | Attending: Oncology | Admitting: Oncology

## 2017-02-26 ENCOUNTER — Inpatient Hospital Stay: Payer: Medicare Other

## 2017-02-26 ENCOUNTER — Other Ambulatory Visit: Payer: Self-pay | Admitting: *Deleted

## 2017-02-26 ENCOUNTER — Encounter: Payer: Self-pay | Admitting: Oncology

## 2017-02-26 VITALS — BP 101/64 | HR 105 | Temp 98.2°F | Resp 20 | Wt 252.0 lb

## 2017-02-26 DIAGNOSIS — D638 Anemia in other chronic diseases classified elsewhere: Secondary | ICD-10-CM

## 2017-02-26 DIAGNOSIS — M25562 Pain in left knee: Secondary | ICD-10-CM | POA: Diagnosis not present

## 2017-02-26 DIAGNOSIS — Z96652 Presence of left artificial knee joint: Secondary | ICD-10-CM | POA: Diagnosis not present

## 2017-02-26 DIAGNOSIS — N189 Chronic kidney disease, unspecified: Secondary | ICD-10-CM | POA: Diagnosis not present

## 2017-02-26 DIAGNOSIS — D631 Anemia in chronic kidney disease: Secondary | ICD-10-CM | POA: Diagnosis not present

## 2017-02-26 DIAGNOSIS — Z862 Personal history of diseases of the blood and blood-forming organs and certain disorders involving the immune mechanism: Secondary | ICD-10-CM

## 2017-02-26 DIAGNOSIS — D649 Anemia, unspecified: Secondary | ICD-10-CM | POA: Insufficient documentation

## 2017-02-26 DIAGNOSIS — M25662 Stiffness of left knee, not elsewhere classified: Secondary | ICD-10-CM | POA: Diagnosis not present

## 2017-02-26 LAB — CBC WITH DIFFERENTIAL/PLATELET
Basophils Absolute: 0.1 10*3/uL (ref 0–0.1)
Basophils Relative: 1 %
Eosinophils Absolute: 0.2 10*3/uL (ref 0–0.7)
Eosinophils Relative: 3 %
HEMATOCRIT: 30.1 % — AB (ref 40.0–52.0)
HEMOGLOBIN: 9.7 g/dL — AB (ref 13.0–18.0)
LYMPHS ABS: 1.4 10*3/uL (ref 1.0–3.6)
Lymphocytes Relative: 23 %
MCH: 26.2 pg (ref 26.0–34.0)
MCHC: 32.1 g/dL (ref 32.0–36.0)
MCV: 81.5 fL (ref 80.0–100.0)
MONOS PCT: 8 %
Monocytes Absolute: 0.5 10*3/uL (ref 0.2–1.0)
NEUTROS ABS: 3.9 10*3/uL (ref 1.4–6.5)
NEUTROS PCT: 65 %
Platelets: 401 10*3/uL (ref 150–440)
RBC: 3.7 MIL/uL — ABNORMAL LOW (ref 4.40–5.90)
RDW: 17.5 % — ABNORMAL HIGH (ref 11.5–14.5)
WBC: 6 10*3/uL (ref 3.8–10.6)

## 2017-02-26 LAB — IRON AND TIBC
Iron: 27 ug/dL — ABNORMAL LOW (ref 45–182)
Saturation Ratios: 10 % — ABNORMAL LOW (ref 17.9–39.5)
TIBC: 262 ug/dL (ref 250–450)
UIBC: 235 ug/dL

## 2017-02-26 LAB — SAMPLE TO BLOOD BANK

## 2017-02-26 LAB — FOLATE: Folate: 15.6 ng/mL (ref >5.9)

## 2017-02-26 LAB — FERRITIN: Ferritin: 350 ng/mL — ABNORMAL HIGH (ref 24–336)

## 2017-02-26 LAB — VITAMIN B12: VITAMIN B 12: 911 pg/mL (ref 180–914)

## 2017-02-26 NOTE — Progress Notes (Signed)
   Hematology/Oncology Consult note [facility]  [phone] [phone]  Patient Care Team: Sowles, Krichna, MD as PCP - General (Family Medicine) Lateef, Kamran, MD as Referring Physician (Internal Medicine) Graves, John, MD as Consulting Physician (Orthopedic Surgery)   Name of the patient: [name]  [id number]  05/05/1950   Date of visit: 02/26/17  Diagnosis- anemia probably secondary to chronic disease/chronic kidney disease  Chief complaint/ Reason for visit- routine f/u of anemia  Heme/Onc history: Patient is a 66 yr old male who is scheduled to undergo right total arthroplasty next month. He has been referred to us for his anemia prior to upcoming surgery. Recent cbc from 03/11/16 showed wbc of 6.5, H/H of 10.3/31.6 and platelet count of 210. TSH was normal at 1.63. LFTs were normal. Iron studies showed normal TIBC of 256, normal iron of 50 and iron saturation of 20%. Ferritin was levetaed at 631. 10 months prior to that ferritin was normal. 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 Other PMH includes HTN, OA, asthma.   Results of bloodwork from 05/05/2016 were as follows: CBC showed white count of 7.3, H&H of 10.1/30.4 and a platelet count of 234. CMP was normal except for elevated BUN of 50 and creatinine of 1.97. Ferritin was elevated at 430. Iron studies showed iron saturation of 20%. Coombs test was negative, folate level was normal and B12 level was elevated at 1170. ESR was elevated at 46. Myeloma panel did not reveal any monoclonal protein. epo level was 13.7  Patient received40000weekly X 4 doses Day -21, Day-14 Day -7 and on the day of knee surgery. His hb went up to 12.9. He did not require blood transfusion post surgery  Patient sees nephrology for his CKD as well. He has not required EPO for anemia of CKD so far    Interval history- overall doing well since his knee  surgery. Has mild fatigue denies other complaints  ECOG PS- 1 Pain scale- 6   Review of systems- Review of Systems  Constitutional: Positive for malaise/fatigue. Negative for chills, fever and weight loss.  HENT: Negative for congestion, ear discharge and nosebleeds.   Eyes: Negative for blurred vision.  Respiratory: Negative for cough, hemoptysis, sputum production, shortness of breath and wheezing.   Cardiovascular: Negative for chest pain, palpitations, orthopnea and claudication.  Gastrointestinal: Negative for abdominal pain, blood in stool, constipation, diarrhea, heartburn, melena, nausea and vomiting.  Genitourinary: Negative for dysuria, flank pain, frequency, hematuria and urgency.  Musculoskeletal: Positive for joint pain. Negative for back pain and myalgias.  Skin: Negative for rash.  Neurological: Negative for dizziness, tingling, focal weakness, seizures, weakness and headaches.  Endo/Heme/Allergies: Does not bruise/bleed easily.  Psychiatric/Behavioral: Negative for depression and suicidal ideas. The patient does not have insomnia.       Allergies  Allergen Reactions  . Lactose Intolerance (Gi) Other (See Comments)    MIGRAINES  . Shellfish Allergy Other (See Comments)    Congestion/breathing problems/migraines.     Past Medical History:  Diagnosis Date  . Allergic rhinitis   . Anemia   . BPH (benign prostatic hyperplasia)   . Chronic sinusitis   . Complication of anesthesia    work up during surgery 2x in the past   . Decreased libido   . ED (erectile dysfunction)   . Fatigue   . Hematuria   . HTN (hypertension)   . Hypogonadism in male   . Hypokalemia      history of   . IBS (irritable bowel syndrome)   . Low serum vitamin D   . Lumbago   . Migraine   . Mild intermittent asthma   . Reflux   . Shingles   . Unilateral inguinal hernia without obstruction or gangrene      Past Surgical History:  Procedure Laterality Date  . ADRENALECTOMY Left  02/11/2016   [facility]  . COLONOSCOPY  02/2012   normal  . JOINT REPLACEMENT    . KNEE ARTHROSCOPY Left 10/06/2009  . SINUS EXPLORATION    . TOTAL KNEE ARTHROPLASTY Right 06/20/2016  . TOTAL KNEE ARTHROPLASTY Right 06/20/2016   Procedure: TOTAL KNEE ARTHROPLASTY;  Surgeon: Graves, John, MD;  Location: [facility] OR;  Service: Orthopedics;  Laterality: Right;  . TOTAL KNEE ARTHROPLASTY Left 12/19/2016   Procedure: LEFT TOTAL KNEE ARTHROPLASTY;  Surgeon: Graves, John, MD;  Location: [facility] ORS;  Service: Orthopedics;  Laterality: Left;  Adductor Block    Social History   Socioeconomic History  . Marital status: Married    Spouse name: Not on file  . Number of children: Not on file  . Years of education: Not on file  . Highest education level: Not on file  Social Needs  . Financial resource strain: Not on file  . Food insecurity - worry: Not on file  . Food insecurity - inability: Not on file  . Transportation needs - medical: Not on file  . Transportation needs - non-medical: Not on file  Occupational History  . Not on file  Tobacco Use  . Smoking status: Former Smoker    Packs/day: 1.00    Years: 10.00    Pack years: 10.00  . Smokeless tobacco: Never Used  . Tobacco comment: 38 years ago 38 when he stopped  Substance and Sexual Activity  . Alcohol use: No    Alcohol/week: 0.0 oz  . Drug use: No  . Sexual activity: Yes    Partners: Female  Other Topics Concern  . Not on file  Social History Narrative  . Not on file    Family History  Problem Relation Age of Onset  . Diabetes Mother   . Heart disease Mother   . Lung disease Mother   . Seizures Maternal Grandmother      Current Outpatient Medications:  .  albuterol (PROAIR HFA) 108 (90 Base) MCG/ACT inhaler, Inhale 2 puffs into the lungs every 6 (six) hours as needed for wheezing or shortness of breath., Disp: 1 Inhaler, Rfl: 0 .  azelastine (OPTIVAR) 0.05 % ophthalmic solution, INSTILL 2 DROPS INTO BOTH EYES TWICE A DAY, Disp: 6  mL, Rfl: 5 .  CIALIS 5 MG tablet, Take 1 tablet (5 mg total) by mouth daily., Disp: 30 tablet, Rfl: 5 .  docusate sodium (COLACE) 100 MG capsule, Take 1 capsule (100 mg total) by mouth 2 (two) times daily. (Patient taking differently: Take 100 mg by mouth daily. ), Disp: 30 capsule, Rfl: 0 .  ELIQUIS 5 MG TABS tablet, Take 1 tablet (5 mg total) by mouth 2 (two) times daily., Disp: 60 tablet, Rfl: 1 .  ferrous sulfate 325 (65 FE) MG tablet, Take 325 mg by mouth 2 (two) times daily with a meal. , Disp: , Rfl:  .  fexofenadine (ALLEGRA) 180 MG tablet, Take 180 mg by mouth daily as needed for allergies or rhinitis., Disp: , Rfl:  .  fexofenadine-pseudoephedrine (ALLEGRA-D 24) 180-240 MG 24 hr tablet, Take 1 tablet by mouth daily as needed (for allergies.).,   Disp: , Rfl:  .  fluticasone (FLONASE) 50 MCG/ACT nasal spray, Place 2 sprays into both nostrils daily., Disp: 48 g, Rfl: 1 .  fluticasone furoate-vilanterol (BREO ELLIPTA) 200-25 MCG/INH AEPB, Inhale 1 puff into the lungs daily., Disp: 60 each, Rfl: 5 .  Glucosamine-MSM-Hyaluronic Acd (JOINT HEALTH PO), Take 1 tablet by mouth daily. INSTAFLEX ADVANCED JOINT SUPPORT, Disp: , Rfl:  .  Misc Natural Products (NF FORMULAS TESTOSTERONE PO), Take 2 tablets by mouth daily. NATURE'S PLUS T-MALE SUPPLEMENT, Disp: , Rfl:  .  montelukast (SINGULAIR) 10 MG tablet, Take 1 tablet (10 mg total) by mouth daily., Disp: 30 tablet, Rfl: 5 .  Multiple Vitamins-Minerals (MULTIVITAMIN ADULTS 50+ PO), Take 1 Package by mouth daily. NATURE'S CODE MEN OVER 50 MULTIVITAMIN PACK, Disp: , Rfl:  .  OVER THE COUNTER MEDICATION, Take 1 tablet by mouth daily. TEST-HD TESTOSTERONE SUPPORT, Disp: , Rfl:  .  oxyCODONE-acetaminophen (PERCOCET/ROXICET) 5-325 MG tablet, Take 1-2 tablets by mouth every 6 (six) hours as needed for severe pain., Disp: 60 tablet, Rfl: 0 .  patiromer (VELTASSA) 8.4 g packet, Take 8.4 g by mouth at bedtime. , Disp: , Rfl:  .  Polyethyl Glycol-Propyl Glycol  (SYSTANE ULTRA) 0.4-0.3 % SOLN, Place 1-2 drops into both eyes 3 (three) times daily as needed (for dry/irritated eyes.)., Disp: , Rfl:  .  tamsulosin (FLOMAX) 0.4 MG CAPS capsule, Take 1 capsule (0.4 mg total) by mouth every evening., Disp: 30 capsule, Rfl: 5 .  tiZANidine (ZANAFLEX) 2 MG tablet, Take 1 tablet (2 mg total) by mouth every 8 (eight) hours as needed for muscle spasms., Disp: 50 tablet, Rfl: 0 .  traMADol (ULTRAM) 50 MG tablet, Take 1-2 tablets (50-100 mg total) by mouth every 6 (six) hours as needed (for pain)., Disp: 60 tablet, Rfl: 0  Physical exam:  Vitals:   02/26/17 1109  BP: 101/64  Pulse: (!) 105  Resp: 20  Temp: 98.2 F (36.8 C)  TempSrc: Tympanic  SpO2: 97%  Weight: 252 lb (114.3 kg)   Physical Exam  Constitutional: He is oriented to person, place, and time and well-developed, well-nourished, and in no distress.  He ambulates with a cane due to his knee surgery  HENT:  Head: Normocephalic and atraumatic.  Eyes: EOM are normal. Pupils are equal, round, and reactive to light.  Neck: Normal range of motion.  Cardiovascular: Normal rate, regular rhythm and normal heart sounds.  Pulmonary/Chest: Effort normal and breath sounds normal.  Abdominal: Soft. Bowel sounds are normal.  Neurological: He is alert and oriented to person, place, and time.  Skin: Skin is warm and dry.     CMP Latest Ref Rng & Units 01/26/2017  Glucose 65 - 99 mg/dL 107(H)  BUN 7 - 25 mg/dL 27(H)  Creatinine 0.70 - 1.25 mg/dL 1.40(H)  Sodium 135 - 146 mmol/L 139  Potassium 3.5 - 5.3 mmol/L 4.6  Chloride 98 - 110 mmol/L 103  CO2 20 - 32 mmol/L 28  Calcium 8.6 - 10.3 mg/dL 9.6  Total Protein 6.1 - 8.1 g/dL 7.1  Total Bilirubin 0.2 - 1.2 mg/dL 0.4  Alkaline Phos 38 - 126 U/L -  AST 10 - 35 U/L 16  ALT 9 - 46 U/L 14   CBC Latest Ref Rng & Units 02/26/2017  WBC 3.8 - 10.6 K/uL 6.0  Hemoglobin 13.0 - 18.0 g/dL 9.7(L)  Hematocrit 40.0 - 52.0 % 30.1(L)  Platelets 150 - 440 K/uL 401     No images are attached to the   encounter.  No results found.   Assessment and plan- Patient is a 67 y.o. male with normocytic anemia of unclear etiology as well as anemia of chronic kidney disease  Recent blood work showed BUN and creatinine of 28/1.3.  H&H was 9.4/30.7 with an MCV of 84.  Platelet count normalized to 329 and white count was 4.9.  Looking back at patient's all prior blood works his hemoglobin is always been between 9-10 on most occasions dating back to 2009.  We did give him Procrit shots prior to his first knee surgery and transiently got his hemoglobin up to 12 which dropped down again back to his baseline of around 10 post surgery patient did not receive any Procrit shots for a second knee surgery as he was beginning to develop some chronic kidney disease.  His creatinine had gone up to 1.8 at one point and is now down to 1.3.  His hemoglobin continues to remain between 9.5-10.5.  Patient does not have any significant comorbidities such as hypertension or diabetes which would explain his anemia.  However his hemoglobin has remained stable between 9.5-10.5.  His hemoglobin today is 9.7 and I will hold off on any Procrit shots at this time.  Prior anemia workup has been unremarkable.  I have repeated his iron studies B12 folate as well as myeloma panel today.  If he has any evidence of iron deficiency I will plan to give him IV iron.  Currently patient is taking 1 iron tablet a day.  If his hemoglobin starts trending downwards to less than 9 I will consider doing a bone marrow and the etiology of his anemia as well as consider Procrit shots at that time  Patient had a thrombocytosis during his prior visit which was likely reactive to his knee surgery and has now resolved  Repeat CBC in 3 and 6 months and I will see him back in 6 months   Visit Diagnosis 1. Normocytic anemia   2. History of anemia due to chronic kidney disease      Dr. Randa Evens, MD, MPH [facility] at  [facility] Pager- [phone] 02/26/2017 12:18 PM

## 2017-02-27 ENCOUNTER — Telehealth: Payer: Self-pay | Admitting: *Deleted

## 2017-02-27 ENCOUNTER — Encounter: Payer: Self-pay | Admitting: Oncology

## 2017-02-27 DIAGNOSIS — M25662 Stiffness of left knee, not elsewhere classified: Secondary | ICD-10-CM | POA: Diagnosis not present

## 2017-02-27 DIAGNOSIS — Z96652 Presence of left artificial knee joint: Secondary | ICD-10-CM | POA: Diagnosis not present

## 2017-02-27 DIAGNOSIS — M25562 Pain in left knee: Secondary | ICD-10-CM | POA: Diagnosis not present

## 2017-02-27 NOTE — Telephone Encounter (Signed)
Left VM message for patient to call me back about lab results and possibly getting IV iron, per Dr. Elroy Channel request.      dhs

## 2017-02-27 NOTE — Telephone Encounter (Signed)
-----   Message from Sindy Guadeloupe, MD sent at 02/26/2017  1:56 PM EST ----- Ferritin is elevated (which could be an inflammatory marker). Iron saturation and serum iron low- either iron deficiency or due to inflammation. We could try 2 doses of IV iron feraheme and see if hb improves after 3 months. Please ask if patient is interested. Thanks, Astrid Divine

## 2017-03-02 ENCOUNTER — Telehealth: Payer: Self-pay | Admitting: *Deleted

## 2017-03-02 DIAGNOSIS — Z96652 Presence of left artificial knee joint: Secondary | ICD-10-CM | POA: Diagnosis not present

## 2017-03-02 DIAGNOSIS — M25662 Stiffness of left knee, not elsewhere classified: Secondary | ICD-10-CM | POA: Diagnosis not present

## 2017-03-02 DIAGNOSIS — M25562 Pain in left knee: Secondary | ICD-10-CM | POA: Diagnosis not present

## 2017-03-02 LAB — MULTIPLE MYELOMA PANEL, SERUM
ALBUMIN/GLOB SERPL: 1 (ref 0.7–1.7)
Albumin SerPl Elph-Mcnc: 3.4 g/dL (ref 2.9–4.4)
Alpha 1: 0.3 g/dL (ref 0.0–0.4)
Alpha2 Glob SerPl Elph-Mcnc: 0.6 g/dL (ref 0.4–1.0)
B-GLOBULIN SERPL ELPH-MCNC: 1.2 g/dL (ref 0.7–1.3)
GAMMA GLOB SERPL ELPH-MCNC: 1.5 g/dL (ref 0.4–1.8)
GLOBULIN, TOTAL: 3.7 g/dL (ref 2.2–3.9)
IGA: 283 mg/dL (ref 61–437)
IgG (Immunoglobin G), Serum: 1599 mg/dL (ref 700–1600)
IgM (Immunoglobulin M), Srm: 52 mg/dL (ref 20–172)
Total Protein ELP: 7.1 g/dL (ref 6.0–8.5)

## 2017-03-02 NOTE — Telephone Encounter (Signed)
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   Verdis Frederickson ----- Message ----- From: Luella Cook, RN Sent: 03/01/2017   1:23 PM To: Festus Holts  Can you make him an appt for 2 feraheme treatments 1 week apart. He would like the appts as late as possible. He works like 30 min. Away. He is ok with 3/1 and 3/8 if that is available.   If not he can't come 3/4 or 3/7 due to work things. You can call him with his appt or send the dates to me and I will call him. thanks

## 2017-03-02 NOTE — Telephone Encounter (Signed)
Pt called back late Friday and I told him what dr. Janese Banks wanted to try and he was agreeable and I got message from Verdis Frederickson that pt can come 3/1 and 3/8 at 2 pm. I left the dates on his cell phone.  On Friday late when I spoke to him he could do both the 3/1 and 3/8 I just needed to confirm that he could come those days and we had chairs both days.

## 2017-03-03 DIAGNOSIS — M25562 Pain in left knee: Secondary | ICD-10-CM | POA: Diagnosis not present

## 2017-03-04 DIAGNOSIS — Z96652 Presence of left artificial knee joint: Secondary | ICD-10-CM | POA: Diagnosis not present

## 2017-03-04 DIAGNOSIS — M25562 Pain in left knee: Secondary | ICD-10-CM | POA: Diagnosis not present

## 2017-03-04 DIAGNOSIS — M25662 Stiffness of left knee, not elsewhere classified: Secondary | ICD-10-CM | POA: Diagnosis not present

## 2017-03-05 ENCOUNTER — Other Ambulatory Visit: Payer: Self-pay | Admitting: Oncology

## 2017-03-05 DIAGNOSIS — M25662 Stiffness of left knee, not elsewhere classified: Secondary | ICD-10-CM | POA: Diagnosis not present

## 2017-03-05 DIAGNOSIS — M25562 Pain in left knee: Secondary | ICD-10-CM | POA: Diagnosis not present

## 2017-03-05 DIAGNOSIS — Z96652 Presence of left artificial knee joint: Secondary | ICD-10-CM | POA: Diagnosis not present

## 2017-03-06 ENCOUNTER — Inpatient Hospital Stay: Payer: Medicare Other

## 2017-03-11 DIAGNOSIS — M25662 Stiffness of left knee, not elsewhere classified: Secondary | ICD-10-CM | POA: Diagnosis not present

## 2017-03-11 DIAGNOSIS — M25562 Pain in left knee: Secondary | ICD-10-CM | POA: Diagnosis not present

## 2017-03-11 DIAGNOSIS — Z96652 Presence of left artificial knee joint: Secondary | ICD-10-CM | POA: Diagnosis not present

## 2017-03-12 DIAGNOSIS — M25662 Stiffness of left knee, not elsewhere classified: Secondary | ICD-10-CM | POA: Diagnosis not present

## 2017-03-12 DIAGNOSIS — M25562 Pain in left knee: Secondary | ICD-10-CM | POA: Diagnosis not present

## 2017-03-12 DIAGNOSIS — Z96652 Presence of left artificial knee joint: Secondary | ICD-10-CM | POA: Diagnosis not present

## 2017-03-13 ENCOUNTER — Inpatient Hospital Stay: Payer: Medicare Other | Attending: Oncology

## 2017-03-13 VITALS — BP 126/76 | HR 90 | Resp 20

## 2017-03-13 DIAGNOSIS — D649 Anemia, unspecified: Secondary | ICD-10-CM | POA: Insufficient documentation

## 2017-03-13 DIAGNOSIS — D631 Anemia in chronic kidney disease: Secondary | ICD-10-CM | POA: Diagnosis not present

## 2017-03-13 DIAGNOSIS — N189 Chronic kidney disease, unspecified: Secondary | ICD-10-CM | POA: Insufficient documentation

## 2017-03-13 DIAGNOSIS — D638 Anemia in other chronic diseases classified elsewhere: Secondary | ICD-10-CM

## 2017-03-13 MED ORDER — SODIUM CHLORIDE 0.9 % IV SOLN
510.0000 mg | Freq: Once | INTRAVENOUS | Status: AC
  Administered 2017-03-13: 510 mg via INTRAVENOUS
  Filled 2017-03-13: qty 17

## 2017-03-13 MED ORDER — SODIUM CHLORIDE 0.9 % IV SOLN
Freq: Once | INTRAVENOUS | Status: AC
  Administered 2017-03-13: 14:00:00 via INTRAVENOUS
  Filled 2017-03-13: qty 1000

## 2017-03-16 DIAGNOSIS — M25562 Pain in left knee: Secondary | ICD-10-CM | POA: Diagnosis not present

## 2017-03-16 DIAGNOSIS — M25662 Stiffness of left knee, not elsewhere classified: Secondary | ICD-10-CM | POA: Diagnosis not present

## 2017-03-16 DIAGNOSIS — Z96652 Presence of left artificial knee joint: Secondary | ICD-10-CM | POA: Diagnosis not present

## 2017-03-18 DIAGNOSIS — M25562 Pain in left knee: Secondary | ICD-10-CM | POA: Diagnosis not present

## 2017-03-18 DIAGNOSIS — M25662 Stiffness of left knee, not elsewhere classified: Secondary | ICD-10-CM | POA: Diagnosis not present

## 2017-03-18 DIAGNOSIS — Z96652 Presence of left artificial knee joint: Secondary | ICD-10-CM | POA: Diagnosis not present

## 2017-03-19 DIAGNOSIS — M25562 Pain in left knee: Secondary | ICD-10-CM | POA: Diagnosis not present

## 2017-03-19 DIAGNOSIS — Z96652 Presence of left artificial knee joint: Secondary | ICD-10-CM | POA: Diagnosis not present

## 2017-03-19 DIAGNOSIS — M25662 Stiffness of left knee, not elsewhere classified: Secondary | ICD-10-CM | POA: Diagnosis not present

## 2017-03-20 ENCOUNTER — Inpatient Hospital Stay: Payer: Medicare Other

## 2017-03-20 VITALS — BP 118/75 | HR 88 | Resp 20

## 2017-03-20 DIAGNOSIS — D649 Anemia, unspecified: Secondary | ICD-10-CM | POA: Diagnosis not present

## 2017-03-20 DIAGNOSIS — D631 Anemia in chronic kidney disease: Secondary | ICD-10-CM | POA: Diagnosis not present

## 2017-03-20 DIAGNOSIS — N189 Chronic kidney disease, unspecified: Secondary | ICD-10-CM | POA: Diagnosis not present

## 2017-03-20 DIAGNOSIS — D638 Anemia in other chronic diseases classified elsewhere: Secondary | ICD-10-CM

## 2017-03-20 MED ORDER — SODIUM CHLORIDE 0.9 % IV SOLN
Freq: Once | INTRAVENOUS | Status: AC
  Administered 2017-03-20: 14:00:00 via INTRAVENOUS
  Filled 2017-03-20: qty 1000

## 2017-03-20 MED ORDER — SODIUM CHLORIDE 0.9 % IV SOLN
510.0000 mg | Freq: Once | INTRAVENOUS | Status: AC
  Administered 2017-03-20: 510 mg via INTRAVENOUS
  Filled 2017-03-20: qty 17

## 2017-03-25 DIAGNOSIS — M25562 Pain in left knee: Secondary | ICD-10-CM | POA: Diagnosis not present

## 2017-03-25 DIAGNOSIS — M25662 Stiffness of left knee, not elsewhere classified: Secondary | ICD-10-CM | POA: Diagnosis not present

## 2017-03-25 DIAGNOSIS — Z96652 Presence of left artificial knee joint: Secondary | ICD-10-CM | POA: Diagnosis not present

## 2017-03-26 DIAGNOSIS — Z96652 Presence of left artificial knee joint: Secondary | ICD-10-CM | POA: Diagnosis not present

## 2017-03-26 DIAGNOSIS — M25562 Pain in left knee: Secondary | ICD-10-CM | POA: Diagnosis not present

## 2017-03-26 DIAGNOSIS — M25662 Stiffness of left knee, not elsewhere classified: Secondary | ICD-10-CM | POA: Diagnosis not present

## 2017-03-30 DIAGNOSIS — M25562 Pain in left knee: Secondary | ICD-10-CM | POA: Diagnosis not present

## 2017-03-30 DIAGNOSIS — Z96652 Presence of left artificial knee joint: Secondary | ICD-10-CM | POA: Diagnosis not present

## 2017-03-30 DIAGNOSIS — M25662 Stiffness of left knee, not elsewhere classified: Secondary | ICD-10-CM | POA: Diagnosis not present

## 2017-04-02 DIAGNOSIS — M25662 Stiffness of left knee, not elsewhere classified: Secondary | ICD-10-CM | POA: Diagnosis not present

## 2017-04-02 DIAGNOSIS — Z96652 Presence of left artificial knee joint: Secondary | ICD-10-CM | POA: Diagnosis not present

## 2017-04-02 DIAGNOSIS — M25562 Pain in left knee: Secondary | ICD-10-CM | POA: Diagnosis not present

## 2017-04-07 DIAGNOSIS — M25562 Pain in left knee: Secondary | ICD-10-CM | POA: Diagnosis not present

## 2017-04-07 DIAGNOSIS — M5442 Lumbago with sciatica, left side: Secondary | ICD-10-CM | POA: Diagnosis not present

## 2017-04-07 DIAGNOSIS — M5432 Sciatica, left side: Secondary | ICD-10-CM | POA: Diagnosis not present

## 2017-04-07 DIAGNOSIS — M545 Low back pain: Secondary | ICD-10-CM | POA: Diagnosis not present

## 2017-04-08 DIAGNOSIS — M25562 Pain in left knee: Secondary | ICD-10-CM | POA: Diagnosis not present

## 2017-04-08 DIAGNOSIS — M25662 Stiffness of left knee, not elsewhere classified: Secondary | ICD-10-CM | POA: Diagnosis not present

## 2017-04-08 DIAGNOSIS — Z96652 Presence of left artificial knee joint: Secondary | ICD-10-CM | POA: Diagnosis not present

## 2017-04-13 ENCOUNTER — Other Ambulatory Visit: Payer: Medicare Other

## 2017-04-13 ENCOUNTER — Ambulatory Visit: Payer: Medicare Other | Admitting: Oncology

## 2017-04-24 ENCOUNTER — Other Ambulatory Visit: Payer: Self-pay | Admitting: Family Medicine

## 2017-04-24 DIAGNOSIS — I824Z2 Acute embolism and thrombosis of unspecified deep veins of left distal lower extremity: Secondary | ICD-10-CM

## 2017-05-05 DIAGNOSIS — M25562 Pain in left knee: Secondary | ICD-10-CM | POA: Diagnosis not present

## 2017-05-05 DIAGNOSIS — M25561 Pain in right knee: Secondary | ICD-10-CM | POA: Diagnosis not present

## 2017-05-26 ENCOUNTER — Inpatient Hospital Stay: Payer: Medicare Other | Attending: Oncology

## 2017-05-26 DIAGNOSIS — N189 Chronic kidney disease, unspecified: Secondary | ICD-10-CM | POA: Insufficient documentation

## 2017-05-26 DIAGNOSIS — D631 Anemia in chronic kidney disease: Secondary | ICD-10-CM | POA: Insufficient documentation

## 2017-05-26 DIAGNOSIS — D649 Anemia, unspecified: Secondary | ICD-10-CM | POA: Diagnosis not present

## 2017-05-26 LAB — CBC
HCT: 33.5 % — ABNORMAL LOW (ref 40.0–52.0)
Hemoglobin: 11.1 g/dL — ABNORMAL LOW (ref 13.0–18.0)
MCH: 27.6 pg (ref 26.0–34.0)
MCHC: 33 g/dL (ref 32.0–36.0)
MCV: 83.8 fL (ref 80.0–100.0)
PLATELETS: 208 10*3/uL (ref 150–440)
RBC: 4 MIL/uL — ABNORMAL LOW (ref 4.40–5.90)
RDW: 18.5 % — AB (ref 11.5–14.5)
WBC: 6.3 10*3/uL (ref 3.8–10.6)

## 2017-05-26 LAB — IRON AND TIBC
Iron: 43 ug/dL — ABNORMAL LOW (ref 45–182)
SATURATION RATIOS: 15 % — AB (ref 17.9–39.5)
TIBC: 280 ug/dL (ref 250–450)
UIBC: 237 ug/dL

## 2017-05-26 LAB — FERRITIN: FERRITIN: 423 ng/mL — AB (ref 24–336)

## 2017-05-26 LAB — FOLATE: FOLATE: 14.4 ng/mL (ref >5.9)

## 2017-05-27 ENCOUNTER — Other Ambulatory Visit: Payer: Self-pay | Admitting: Family Medicine

## 2017-05-27 DIAGNOSIS — J302 Other seasonal allergic rhinitis: Secondary | ICD-10-CM

## 2017-05-29 ENCOUNTER — Ambulatory Visit: Payer: BC Managed Care – PPO | Admitting: Family Medicine

## 2017-06-02 ENCOUNTER — Ambulatory Visit (INDEPENDENT_AMBULATORY_CARE_PROVIDER_SITE_OTHER): Payer: Medicare Other | Admitting: Family Medicine

## 2017-06-02 ENCOUNTER — Encounter: Payer: Self-pay | Admitting: Family Medicine

## 2017-06-02 VITALS — BP 108/80 | HR 73 | Resp 16 | Ht 74.0 in | Wt 258.5 lb

## 2017-06-02 DIAGNOSIS — J301 Allergic rhinitis due to pollen: Secondary | ICD-10-CM

## 2017-06-02 DIAGNOSIS — M545 Low back pain, unspecified: Secondary | ICD-10-CM

## 2017-06-02 DIAGNOSIS — N401 Enlarged prostate with lower urinary tract symptoms: Secondary | ICD-10-CM

## 2017-06-02 DIAGNOSIS — D638 Anemia in other chronic diseases classified elsewhere: Secondary | ICD-10-CM

## 2017-06-02 DIAGNOSIS — E875 Hyperkalemia: Secondary | ICD-10-CM | POA: Diagnosis not present

## 2017-06-02 DIAGNOSIS — N183 Chronic kidney disease, stage 3 unspecified: Secondary | ICD-10-CM

## 2017-06-02 DIAGNOSIS — J4540 Moderate persistent asthma, uncomplicated: Secondary | ICD-10-CM

## 2017-06-02 DIAGNOSIS — E785 Hyperlipidemia, unspecified: Secondary | ICD-10-CM

## 2017-06-02 DIAGNOSIS — Z86718 Personal history of other venous thrombosis and embolism: Secondary | ICD-10-CM | POA: Diagnosis not present

## 2017-06-02 DIAGNOSIS — N138 Other obstructive and reflux uropathy: Secondary | ICD-10-CM | POA: Diagnosis not present

## 2017-06-02 MED ORDER — CIALIS 5 MG PO TABS: 5.0000 mg | ORAL_TABLET | Freq: Every day | ORAL | 5 refills | Status: DC

## 2017-06-02 MED ORDER — MONTELUKAST SODIUM 10 MG PO TABS: 10.0000 mg | ORAL_TABLET | Freq: Every day | ORAL | 5 refills | Status: DC

## 2017-06-02 MED ORDER — TAMSULOSIN HCL 0.4 MG PO CAPS: 0.4000 mg | ORAL_CAPSULE | Freq: Every evening | ORAL | 5 refills | Status: DC

## 2017-06-02 MED ORDER — FLUTICASONE FUROATE-VILANTEROL 100-25 MCG/INH IN AEPB: 1.0000 | INHALATION_SPRAY | Freq: Every day | RESPIRATORY_TRACT | 5 refills | Status: DC

## 2017-06-02 MED ORDER — FLUTICASONE PROPIONATE 50 MCG/ACT NA SUSP: 2.0000 | Freq: Every day | NASAL | 5 refills | Status: DC

## 2017-06-02 MED ORDER — TIZANIDINE HCL 2 MG PO TABS: 2.0000 mg | ORAL_TABLET | Freq: Three times a day (TID) | ORAL | 0 refills | Status: DC | PRN

## 2017-06-02 NOTE — Progress Notes (Signed)
Name: [name]   MRN: [id number]    DOB: Apr 13, 1950   Date:06/02/2017       Progress Note  Subjective  Chief Complaint  Chief Complaint  Patient presents with  . Hypertension  . Asthma  . Gastroesophageal Reflux  . Hyperglycemia    HPI  HTN:bp has normalized since adrenal gland removed.  Obese: he has lost almost 20 lbs since March 2018, he has a history of hyperaldosteronism. He has gained weight since last visit, but had knee surgery and is not as active now. Discussed importance of portion control and weight loss .   Asthma Moderate: he is using Breo high dose  and singulair daily, Proair prn only, occasionally for chest tightness and it improves symptoms. He is doing well at this time, no wheezing, cough or chest pain. He is on high dose Breo and we will try changing to 100/25 and monitor symptoms.   AR: stable with medication, no sneezing, nasal congestion, mild intermittent rhinorrhea and occasionally takes allegra D. Unchanged.   S/p knee replacement bilaterally:he had right knee replacement June 2018, and left knee replacement 12/19/2016, he developed a DVT of left lower leg within the first week of post-op and has been taking Eliquis, he is due for repeat doppler and we will stop medication is negative.   BPH: taking Cialis and Flomax, doing well on medication. Last PSA was normal.   CKI stage III: and hyperkalemia, under the care of Dr. Holley Raring, per his notes he was supposed to take valsartan 80 mg for kidney protection but he is not taking it at this time, I will contact Dr. Holley Raring and see what he recommends.   Sciatica left leg: he has a history of recurrent back pain with radiculitis. He was seen by Ortho and was given Tramadol and prednisone taper a few weeks ago, still has some symptoms and is using cane again, shoots down left buttocks. No bowel or bladder incontinence.   Patient Active Problem List   Diagnosis Date Noted  . Primary osteoarthritis of  left knee 12/19/2016  . Anemia, unspecified 04/30/2016  . History of benign neoplasm of adrenal gland 02/11/2016  . History of iron deficiency anemia 10/10/2015  . BPH (benign prostatic hyperplasia) 06/20/2015  . ED (erectile dysfunction) 06/20/2015  . Allergic rhinitis, seasonal 06/20/2015  . Anemia of chronic disease 06/20/2015  . Hypogonadism in male 06/20/2015  . Asthma, well controlled, moderate persistent 06/20/2015  . Hypertension, benign 06/20/2015  . Hyperglycemia 06/20/2015  . History of shingles 06/20/2015  . GERD without esophagitis 06/20/2015  . Chronic radicular low back pain 06/20/2015  . History of epilepsy 06/20/2015  . Migraine without aura and without status migrainosus, not intractable 06/20/2015  . Dyslipidemia 06/20/2015  . Primary osteoarthritis of both knees 06/20/2015    Past Surgical History:  Procedure Laterality Date  . ADRENALECTOMY Left 02/11/2016   [facility]  . COLONOSCOPY  02/2012   normal  . JOINT REPLACEMENT    . KNEE ARTHROSCOPY Left 10/06/2009  . SINUS EXPLORATION    . TOTAL KNEE ARTHROPLASTY Right 06/20/2016  . TOTAL KNEE ARTHROPLASTY Right 06/20/2016   Procedure: TOTAL KNEE ARTHROPLASTY;  Surgeon: Dorna Leitz, MD;  Location: [facility];  Service: Orthopedics;  Laterality: Right;  . TOTAL KNEE ARTHROPLASTY Left 12/19/2016   Procedure: LEFT TOTAL KNEE ARTHROPLASTY;  Surgeon: Dorna Leitz, MD;  Location: [facility];  Service: Orthopedics;  Laterality: Left;  Adductor Block    Family History  Problem Relation Age of Onset  .  Diabetes Mother   . Heart disease Mother   . Lung disease Mother   . Seizures Maternal Grandmother     Social History   Socioeconomic History  . Marital status: Married    Spouse name: Not on file  . Number of children: Not on file  . Years of education: Not on file  . Highest education level: Not on file  Occupational History  . Not on file  Social Needs  . Financial resource strain: Not on file  . Food insecurity:     Worry: Not on file    Inability: Not on file  . Transportation needs:    Medical: Not on file    Non-medical: Not on file  Tobacco Use  . Smoking status: Former Smoker    Packs/day: 1.00    Years: 10.00    Pack years: 10.00  . Smokeless tobacco: Never Used  . Tobacco comment: 38 years ago 36 when he stopped  Substance and Sexual Activity  . Alcohol use: No    Alcohol/week: 0.0 oz  . Drug use: No  . Sexual activity: Yes    Partners: Female  Lifestyle  . Physical activity:    Days per week: Not on file    Minutes per session: Not on file  . Stress: Not on file  Relationships  . Social connections:    Talks on phone: Not on file    Gets together: Not on file    Attends religious service: Not on file    Active member of club or organization: Not on file    Attends meetings of clubs or organizations: Not on file    Relationship status: Not on file  . Intimate partner violence:    Fear of current or ex partner: Not on file    Emotionally abused: Not on file    Physically abused: Not on file    Forced sexual activity: Not on file  Other Topics Concern  . Not on file  Social History Narrative  . Not on file     Current Outpatient Medications:  .  albuterol (PROAIR HFA) 108 (90 Base) MCG/ACT inhaler, Inhale 2 puffs into the lungs every 6 (six) hours as needed for wheezing or shortness of breath., Disp: 1 Inhaler, Rfl: 0 .  azelastine (OPTIVAR) 0.05 % ophthalmic solution, INSTILL 2 DROPS INTO BOTH EYES TWICE A DAY, Disp: 6 mL, Rfl: 5 .  CIALIS 5 MG tablet, Take 1 tablet (5 mg total) by mouth daily., Disp: 30 tablet, Rfl: 5 .  docusate sodium (COLACE) 100 MG capsule, Take 1 capsule (100 mg total) by mouth 2 (two) times daily. (Patient taking differently: Take 100 mg by mouth daily. ), Disp: 30 capsule, Rfl: 0 .  ELIQUIS 5 MG TABS tablet, TAKE 1 TABLET BY MOUTH TWICE A DAY, Disp: 60 tablet, Rfl: 1 .  fexofenadine (ALLEGRA) 180 MG tablet, Take 180 mg by mouth daily as needed for  allergies or rhinitis., Disp: , Rfl:  .  fexofenadine-pseudoephedrine (ALLEGRA-D 24) 180-240 MG 24 hr tablet, Take 1 tablet by mouth daily as needed (for allergies.)., Disp: , Rfl:  .  fluticasone (FLONASE) 50 MCG/ACT nasal spray, Place 2 sprays into both nostrils daily., Disp: 48 g, Rfl: 1 .  Glucosamine-MSM-Hyaluronic Acd (JOINT HEALTH PO), Take 1 tablet by mouth daily. INSTAFLEX ADVANCED JOINT SUPPORT, Disp: , Rfl:  .  Lactobacillus Rhamnosus, GG, (CULTURELLE) CAPS, Take by mouth., Disp: , Rfl:  .  Misc Natural Products (NF FORMULAS TESTOSTERONE PO),  Take 2 tablets by mouth daily. NATURE'S PLUS T-MALE SUPPLEMENT, Disp: , Rfl:  .  montelukast (SINGULAIR) 10 MG tablet, Take 1 tablet (10 mg total) by mouth daily., Disp: 30 tablet, Rfl: 5 .  Multiple Vitamins-Minerals (MULTIVITAMIN ADULTS 50+ PO), Take 1 Package by mouth daily. NATURE'S CODE MEN OVER 50 MULTIVITAMIN PACK, Disp: , Rfl:  .  OVER THE COUNTER MEDICATION, Take 1 tablet by mouth daily. TEST-HD TESTOSTERONE SUPPORT, Disp: , Rfl:  .  patiromer (VELTASSA) 8.4 g packet, Take 8.4 g by mouth at bedtime., Disp: , Rfl:  .  Polyethyl Glycol-Propyl Glycol (SYSTANE ULTRA) 0.4-0.3 % SOLN, Place 1-2 drops into both eyes 3 (three) times daily as needed (for dry/irritated eyes.)., Disp: , Rfl:  .  tamsulosin (FLOMAX) 0.4 MG CAPS capsule, Take 1 capsule (0.4 mg total) by mouth every evening., Disp: 30 capsule, Rfl: 5 .  tiZANidine (ZANAFLEX) 2 MG tablet, Take 1 tablet (2 mg total) by mouth every 8 (eight) hours as needed for muscle spasms., Disp: 50 tablet, Rfl: 0 .  traMADol (ULTRAM) 50 MG tablet, Take by mouth., Disp: , Rfl:   Allergies  Allergen Reactions  . Lactose Intolerance (Gi) Other (See Comments)    MIGRAINES  . Shellfish Allergy Other (See Comments)    Congestion/breathing problems/migraines.     ROS  Constitutional: Negative for fever or weight change.  Respiratory: Negative for cough and shortness of breath.   Cardiovascular:  Negative for chest pain or palpitations.  Gastrointestinal: Negative for abdominal pain, no bowel changes.  Musculoskeletal: Positive  for gait problem or joint swelling.  Skin: Negative for rash.  Neurological: Negative for dizziness or headache.  No other specific complaints in a complete review of systems (except as listed in HPI above).  Objective  Vitals:   06/02/17 0917  BP: 108/80  Pulse: 73  Resp: 16  SpO2: 98%  Weight: 258 lb 8 oz (117.3 kg)  Height: 6\' 2"  (1.88 m)    Body mass index is 33.19 kg/m.  Physical Exam  Constitutional: Patient appears well-developed and well-nourished. Obese  No distress.  HEENT: head atraumatic, normocephalic, pupils equal and reactive to light,  neck supple, throat within normal limits Cardiovascular: Normal rate, regular rhythm and normal heart sounds.  No murmur heard. No BLE edema. Pulmonary/Chest: Effort normal and breath sounds normal. No respiratory distress. Abdominal: Soft.  There is no tenderness. Psychiatric: Patient has a normal mood and affect. behavior is normal. Judgment and thought content normal. Muscular skeletal: using a cane, flexion of right knee a little over 90, but less than 90 on left.   Recent Results (from the past 2160 hour(s))  Iron and TIBC     Status: Abnormal   Collection Time: 05/26/17  4:25 PM  Result Value Ref Range   Iron 43 (L) 45 - 182 ug/dL   TIBC 280 250 - 450 ug/dL   Saturation Ratios 15 (L) 17.9 - 39.5 %   UIBC 237 ug/dL    Comment: Performed at [facility], [address] [place], [place] [address]  Folate     Status: None   Collection Time: 05/26/17  4:25 PM  Result Value Ref Range   Folate 14.4 >5.9 ng/mL    Comment: Performed at [facility], [address] [place], [place] [address]  CBC     Status: Abnormal   Collection Time: 05/26/17  4:25 PM  Result Value Ref Range   WBC 6.3 3.8 - 10.6 K/uL   RBC 4.00 (  L) 4.40 - 5.90 MIL/uL   Hemoglobin 11.1 (L) 13.0 - 18.0  g/dL   HCT 33.5 (L) 40.0 - 52.0 %   MCV 83.8 80.0 - 100.0 fL   MCH 27.6 26.0 - 34.0 pg   MCHC 33.0 32.0 - 36.0 g/dL   RDW 18.5 (H) 11.5 - 14.5 %   Platelets 208 150 - 440 K/uL    Comment: Performed at [facility], [address] [place], [place] [address]  Ferritin     Status: Abnormal   Collection Time: 05/26/17  4:25 PM  Result Value Ref Range   Ferritin 423 (H) 24 - 336 ng/mL    Comment: Performed at [facility], [address] [place], [place] [address]      PHQ2/9: Depression screen [facility] 2/9 06/02/2017 11/26/2016 10/10/2015 06/20/2015 10/31/2014  Decreased Interest 0 0 0 0 0  Down, Depressed, Hopeless 0 0 0 0 0  PHQ - 2 Score 0 0 0 0 0     Fall Risk: Fall Risk  06/02/2017 01/19/2017 11/26/2016 10/10/2015 06/20/2015  Falls in the past year? No No No No No     Functional Status Survey: Is the patient deaf or have difficulty hearing?: No Does the patient have difficulty seeing, even when wearing glasses/contacts?: No Does the patient have difficulty concentrating, remembering, or making decisions?: No Does the patient have difficulty walking or climbing stairs?: No Does the patient have difficulty dressing or bathing?: No Does the patient have difficulty doing errands alone such as visiting a doctor's office or shopping?: No    Assessment & Plan  1. Asthma, well controlled, moderate persistent  - fluticasone furoate-vilanterol (BREO ELLIPTA) 100-25 MCG/INH AEPB; Inhale 1 puff into the lungs daily.  Dispense: 60 each; Refill: 5 - montelukast (SINGULAIR) 10 MG tablet; Take 1 tablet (10 mg total) by mouth daily.  Dispense: 30 tablet; Refill: 5  2. Benign prostatic hyperplasia with urinary obstruction  - CIALIS 5 MG tablet; Take 1 tablet (5 mg total) by mouth daily.  Dispense: 30 tablet; Refill: 5 - tamsulosin (FLOMAX) 0.4 MG CAPS capsule; Take 1 capsule (0.4 mg total) by mouth every evening.  Dispense: 30 capsule; Refill: 5  3. Allergic rhinitis,  seasonal  - fluticasone (FLONASE) 50 MCG/ACT nasal spray; Place 2 sprays into both nostrils daily.  Dispense: 16 g; Refill: 5  4. Dyslipidemia   5. Hyperkalemia   6. Chronic kidney insufficiency, stage 3 (moderate) (HCC)  Continue follow up with Dr. Holley Raring, he is not currently taking valsartan and we will contact his office to confirm if he needs to re-start it  7. History of deep venous thrombosis (DVT) of distal vein of left lower extremity  - VAS [place] LOWER EXTREMITY VENOUS (DVT); Future  8. Anemia of chronic disease  Seeing Dr. Janese Banks   9. Intermittent low back pain  - tiZANidine (ZANAFLEX) 2 MG tablet; Take 1 tablet (2 mg total) by mouth every 8 (eight) hours as needed for muscle spasms.  Dispense: 60 tablet; Refill: 0

## 2017-06-08 ENCOUNTER — Telehealth: Payer: Self-pay

## 2017-06-08 NOTE — Telephone Encounter (Signed)
Spoke with patient and symptoms are improving from dizziness with the nasal rinse and Ibuprofen.

## 2017-06-19 DIAGNOSIS — E876 Hypokalemia: Secondary | ICD-10-CM | POA: Diagnosis not present

## 2017-06-19 DIAGNOSIS — D631 Anemia in chronic kidney disease: Secondary | ICD-10-CM | POA: Diagnosis not present

## 2017-06-19 DIAGNOSIS — E875 Hyperkalemia: Secondary | ICD-10-CM | POA: Diagnosis not present

## 2017-06-19 DIAGNOSIS — I10 Essential (primary) hypertension: Secondary | ICD-10-CM | POA: Diagnosis not present

## 2017-06-19 DIAGNOSIS — N182 Chronic kidney disease, stage 2 (mild): Secondary | ICD-10-CM | POA: Diagnosis not present

## 2017-06-19 DIAGNOSIS — N183 Chronic kidney disease, stage 3 (moderate): Secondary | ICD-10-CM | POA: Diagnosis not present

## 2017-06-22 ENCOUNTER — Other Ambulatory Visit: Payer: Self-pay

## 2017-06-22 ENCOUNTER — Telehealth: Payer: Self-pay

## 2017-06-22 ENCOUNTER — Telehealth: Payer: Self-pay | Admitting: Family Medicine

## 2017-06-22 DIAGNOSIS — Z86718 Personal history of other venous thrombosis and embolism: Secondary | ICD-10-CM

## 2017-06-22 NOTE — Telephone Encounter (Signed)
Copied from [place] [id number]. Topic: Quick Communication - See Telephone Encounter >> Jun 22, 2017  3:43 PM Boyd Kerbs wrote: CRM for notification. See Telephone encounter for: 06/22/17.  Pt. Saw Dr. Ancil Boozer and was told he needed to f/u with a vascular dr. To check for blood clots. They have not heard anything yet. Please call

## 2017-06-22 NOTE — Telephone Encounter (Signed)
I contacted this patient and his wife to inform them that the previous order that was placed was sent to the wrong location that is why they never received a call. I apologized for the inconvenience and then thanked them for following back up on it.    A new order has been placed and he is scheduled for Friday, June 26, 2017 @ 9:45am at the [facility].   They both voice verbal understanding and said thanks.

## 2017-06-26 ENCOUNTER — Encounter: Payer: Self-pay | Admitting: Family Medicine

## 2017-06-26 ENCOUNTER — Ambulatory Visit
Admission: RE | Admit: 2017-06-26 | Discharge: 2017-06-26 | Disposition: A | Discharge status: Home / Self Care | Payer: Medicare Other | Source: Ambulatory Visit | Attending: Family Medicine | Admitting: Family Medicine

## 2017-06-26 DIAGNOSIS — Z86718 Personal history of other venous thrombosis and embolism: Secondary | ICD-10-CM | POA: Insufficient documentation

## 2017-06-26 DIAGNOSIS — Z7901 Long term (current) use of anticoagulants: Secondary | ICD-10-CM | POA: Insufficient documentation

## 2017-06-26 DIAGNOSIS — I82432 Acute embolism and thrombosis of left popliteal vein: Secondary | ICD-10-CM | POA: Diagnosis not present

## 2017-06-26 DIAGNOSIS — I82532 Chronic embolism and thrombosis of left popliteal vein: Secondary | ICD-10-CM | POA: Insufficient documentation

## 2017-06-30 DIAGNOSIS — Z96651 Presence of right artificial knee joint: Secondary | ICD-10-CM | POA: Diagnosis not present

## 2017-06-30 DIAGNOSIS — Z96652 Presence of left artificial knee joint: Secondary | ICD-10-CM | POA: Diagnosis not present

## 2017-06-30 DIAGNOSIS — M1612 Unilateral primary osteoarthritis, left hip: Secondary | ICD-10-CM | POA: Diagnosis not present

## 2017-07-02 ENCOUNTER — Ambulatory Visit (INDEPENDENT_AMBULATORY_CARE_PROVIDER_SITE_OTHER): Payer: Medicare Other | Admitting: Family Medicine

## 2017-07-02 ENCOUNTER — Other Ambulatory Visit
Admission: RE | Admit: 2017-07-02 | Discharge: 2017-07-02 | Disposition: A | Discharge status: Home / Self Care | Payer: Medicare Other | Source: Ambulatory Visit | Attending: Family Medicine | Admitting: Family Medicine

## 2017-07-02 ENCOUNTER — Ambulatory Visit
Admission: RE | Admit: 2017-07-02 | Discharge: 2017-07-02 | Disposition: A | Discharge status: Home / Self Care | Payer: Medicare Other | Source: Ambulatory Visit | Attending: Family Medicine | Admitting: Family Medicine

## 2017-07-02 ENCOUNTER — Encounter: Payer: Self-pay | Admitting: Family Medicine

## 2017-07-02 ENCOUNTER — Other Ambulatory Visit: Payer: Self-pay | Admitting: Orthopedic Surgery

## 2017-07-02 VITALS — BP 134/82 | HR 86 | Temp 98.1°F | Resp 16 | Ht 74.0 in | Wt 263.4 lb

## 2017-07-02 DIAGNOSIS — J4521 Mild intermittent asthma with (acute) exacerbation: Secondary | ICD-10-CM | POA: Diagnosis not present

## 2017-07-02 DIAGNOSIS — R0602 Shortness of breath: Secondary | ICD-10-CM | POA: Insufficient documentation

## 2017-07-02 DIAGNOSIS — M25552 Pain in left hip: Secondary | ICD-10-CM

## 2017-07-02 DIAGNOSIS — R058 Other specified cough: Secondary | ICD-10-CM

## 2017-07-02 DIAGNOSIS — R918 Other nonspecific abnormal finding of lung field: Secondary | ICD-10-CM | POA: Diagnosis not present

## 2017-07-02 DIAGNOSIS — R5383 Other fatigue: Secondary | ICD-10-CM | POA: Insufficient documentation

## 2017-07-02 DIAGNOSIS — R05 Cough: Secondary | ICD-10-CM | POA: Insufficient documentation

## 2017-07-02 DIAGNOSIS — Z23 Encounter for immunization: Secondary | ICD-10-CM | POA: Diagnosis not present

## 2017-07-02 DIAGNOSIS — M1612 Unilateral primary osteoarthritis, left hip: Secondary | ICD-10-CM | POA: Diagnosis not present

## 2017-07-02 LAB — CBC
HCT: 35.9 % — ABNORMAL LOW (ref 40.0–52.0)
HEMOGLOBIN: 11.6 g/dL — AB (ref 13.0–18.0)
MCH: 28.5 pg (ref 26.0–34.0)
MCHC: 32.4 g/dL (ref 32.0–36.0)
MCV: 87.7 fL (ref 80.0–100.0)
Platelets: 240 10*3/uL (ref 150–440)
RBC: 4.09 MIL/uL — ABNORMAL LOW (ref 4.40–5.90)
RDW: 15.5 % — AB (ref 11.5–14.5)
WBC: 5.1 10*3/uL (ref 3.8–10.6)

## 2017-07-02 MED ORDER — LIDOCAINE HCL (PF) 1 % IJ SOLN
2.0000 mL | Freq: Once | INTRAMUSCULAR | Status: AC
  Administered 2017-07-02: 2 mL via INTRADERMAL

## 2017-07-02 MED ORDER — CEFTRIAXONE SODIUM 1 G IJ SOLR
1.0000 g | INTRAMUSCULAR | Status: DC
  Administered 2017-07-02: 1 g via INTRAMUSCULAR

## 2017-07-02 MED ORDER — AZITHROMYCIN 250 MG PO TABS: ORAL_TABLET | ORAL | 0 refills | Status: DC

## 2017-07-02 NOTE — Progress Notes (Signed)
Name: [name]   MRN: [id number]    DOB: [birth year]   Date:07/02/2017       Progress Note  Subjective  Chief Complaint  Chief Complaint  Patient presents with  . Dry Cough    States it will go away and come back within the past month but having right side pain  . Sinusitis    Onset-1 month ago, head gets swimmy-dizziness, right side of lungs hurt, SOB. fatigue, brain fogged and chills.    HPI  Dry cough: he states he has been sick for about one month, initially had nasal congestion, frontal pressure, fatigue, post-nasal drainage, but no sore throat. He use nasal saline and took ibuprofen , symptoms improved, however over the past two weeks he has noticed dizziness, right side chest pain, SOB even at rest and also a dry cough occasionally sputum production. No fever but has noticed chills. He has been eating but does not have a good appetite. He has asthma and has been using rescue inhaler about twice a week. He is feeling very fatigued. He was on breo 100 and increased dose to 200 because of worsening of sob. 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 I will sign forms, however advised to wait until next week to have procedure. Needs to be evaluated for pneumonia and finish antibiotics.   Patient Active Problem List   Diagnosis Date Noted  . 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 Primary osteoarthritis of left knee 12/19/2016  . Anemia, unspecified 04/30/2016  . History of benign neoplasm of adrenal gland 02/11/2016  . History of iron deficiency anemia 10/10/2015  . BPH (benign prostatic hyperplasia) 06/20/2015  . ED (erectile dysfunction) 06/20/2015  . Allergic rhinitis, seasonal 06/20/2015  . Anemia of chronic disease 06/20/2015  .  Hypogonadism in male 06/20/2015  . Asthma, well controlled, moderate persistent 06/20/2015  . Hypertension, benign 06/20/2015  . Hyperglycemia 06/20/2015  . History of shingles 06/20/2015  . GERD without esophagitis 06/20/2015  . Chronic radicular low back pain 06/20/2015  . History of epilepsy 06/20/2015  . Migraine without aura and without status migrainosus, not intractable 06/20/2015  . Dyslipidemia 06/20/2015  . Primary osteoarthritis of both knees 06/20/2015    Social History   Tobacco Use  . Smoking status: Former Smoker    Packs/day: 1.00    Years: 10.00    Pack years: 10.00  . Smokeless tobacco: Never Used  . Tobacco comment: 38 years ago 23 when he stopped  Substance Use Topics  . Alcohol use: No    Alcohol/week: 0.0 oz     Current Outpatient Medications:  .  albuterol (PROAIR HFA) 108 (90 Base) MCG/ACT inhaler, Inhale 2 puffs into the lungs every 6 (six) hours as needed for wheezing or shortness of breath., Disp: 1 Inhaler, Rfl: 0 .  azelastine (OPTIVAR) 0.05 % ophthalmic solution, INSTILL 2 DROPS INTO BOTH EYES TWICE A DAY, Disp: 6 mL, Rfl: 5 .  BREO ELLIPTA 200-25 MCG/INH AEPB, TAKE 1 PUFF BY MOUTH EVERY DAY, Disp: , Rfl: 5 .  CIALIS 5 MG tablet, Take 1 tablet (5 mg total) by mouth daily., Disp: 30 tablet, Rfl: 5 .  docusate sodium (COLACE) 100 MG  capsule, Take 1 capsule (100 mg total) by mouth 2 (two) times daily. (Patient taking differently: Take 100 mg by mouth daily. ), Disp: 30 capsule, Rfl: 0 .  ELIQUIS 5 MG TABS tablet, TAKE 1 TABLET BY MOUTH TWICE A DAY, Disp: 60 tablet, Rfl: 1 .  fexofenadine (ALLEGRA) 180 MG tablet, Take 180 mg by mouth daily as needed for allergies or rhinitis., Disp: , Rfl:  .  fexofenadine-pseudoephedrine (ALLEGRA-D 24) 180-240 MG 24 hr tablet, Take 1 tablet by mouth daily as needed (for allergies.)., Disp: , Rfl:  .  fluticasone (FLONASE) 50 MCG/ACT nasal spray, Place 2 sprays into both nostrils daily., Disp: 16 g, Rfl: 5 .   Glucosamine-MSM-Hyaluronic Acd (JOINT HEALTH PO), Take 1 tablet by mouth daily. INSTAFLEX ADVANCED JOINT SUPPORT, Disp: , Rfl:  .  Lactobacillus Rhamnosus, GG, (CULTURELLE) CAPS, Take by mouth., Disp: , Rfl:  .  Misc Natural Products (NF FORMULAS TESTOSTERONE PO), Take 2 tablets by mouth daily. NATURE'S PLUS T-MALE SUPPLEMENT, Disp: , Rfl:  .  montelukast (SINGULAIR) 10 MG tablet, Take 1 tablet (10 mg total) by mouth daily., Disp: 30 tablet, Rfl: 5 .  Multiple Vitamins-Minerals (MULTIVITAMIN ADULTS 50+ PO), Take 1 Package by mouth daily. NATURE'S CODE MEN OVER 50 MULTIVITAMIN PACK, Disp: , Rfl:  .  OVER THE COUNTER MEDICATION, Take 1 tablet by mouth daily. TEST-HD TESTOSTERONE SUPPORT, Disp: , Rfl:  .  patiromer (VELTASSA) 8.4 g packet, Take 8.4 g by mouth at bedtime., Disp: , Rfl:  .  Polyethyl Glycol-Propyl Glycol (SYSTANE ULTRA) 0.4-0.3 % SOLN, Place 1-2 drops into both eyes 3 (three) times daily as needed (for dry/irritated eyes.)., Disp: , Rfl:  .  tiZANidine (ZANAFLEX) 2 MG tablet, Take 1 tablet (2 mg total) by mouth every 8 (eight) hours as needed for muscle spasms., Disp: 60 tablet, Rfl: 0 .  traMADol (ULTRAM) 50 MG tablet, Take by mouth., Disp: , Rfl:  .  tamsulosin (FLOMAX) 0.4 MG CAPS capsule, Take 1 capsule (0.4 mg total) by mouth every evening. (Patient not taking: Reported on 07/02/2017), Disp: 30 capsule, Rfl: 5  Allergies  Allergen Reactions  . Lactose Intolerance (Gi) Other (See Comments)    MIGRAINES  . Shellfish Allergy Other (See Comments)    Congestion/breathing problems/migraines.    ROS  Constitutional: Negative for fever or weight change.  Respiratory: positive  for cough and shortness of breath.   Cardiovascular: positive  for chest pain ( pleuritic ) but no  palpitations.  Gastrointestinal: Negative for abdominal pain, no bowel changes.  Musculoskeletal: positive  for gait problem and some knee  joint swelling.  Skin: Negative for rash.  Neurological: Negative for  dizziness or headache.  No other specific complaints in a complete review of systems (except as listed in HPI above).  Objective  Vitals:   07/02/17 1130  BP: 134/82  Pulse: 86  Resp: 16  Temp: 98.1 F (36.7 C)  TempSrc: Oral  SpO2: 98%  Weight: 263 lb 6.4 oz (119.5 kg)  Height: 6\' 2"  (1.88 m)    Body mass index is 33.82 kg/m.    Physical Exam  Constitutional: Patient appears well-developed and well-nourished. Obese  No distress.  HEENT: head atraumatic, normocephalic, pupils equal and reactive to light, left eye with subconjunctival hemorrhage  neck supple, throat within normal limits Cardiovascular: Normal rate, regular rhythm and normal heart sounds.  No murmur heard. No BLE edema. Pulmonary/Chest: Effort normal and breath sounds normal. No respiratory distress. Abdominal: Soft.  There is no tenderness.  Psychiatric: Patient has a normal mood and affect. behavior is normal. Judgment and thought content normal.   Recent Results (from the past 2160 hour(s))  Iron and TIBC     Status: Abnormal   Collection Time: 05/26/17  4:25 PM  Result Value Ref Range   Iron 43 (L) 45 - 182 ug/dL   TIBC 280 250 - 450 ug/dL   Saturation Ratios 15 (L) 17.9 - 39.5 %   UIBC 237 ug/dL    Comment: Performed at [facility], [address] [place], [place] [address]  Folate     Status: None   Collection Time: 05/26/17  4:25 PM  Result Value Ref Range   Folate 14.4 >5.9 ng/mL    Comment: Performed at [facility], [address] [place], [place] [address]  CBC     Status: Abnormal   Collection Time: 05/26/17  4:25 PM  Result Value Ref Range   WBC 6.3 3.8 - 10.6 K/uL   RBC 4.00 (L) 4.40 - 5.90 MIL/uL   Hemoglobin 11.1 (L) 13.0 - 18.0 g/dL   HCT 33.5 (L) 40.0 - 52.0 %   MCV 83.8 80.0 - 100.0 fL   MCH 27.6 26.0 - 34.0 pg   MCHC 33.0 32.0 - 36.0 g/dL   RDW 18.5 (H) 11.5 - 14.5 %   Platelets 208 150 - 440 K/uL    Comment: Performed at [facility], [address], [place], [place] [address]  Ferritin     Status: Abnormal   Collection Time: 05/26/17  4:25 PM  Result Value Ref Range   Ferritin 423 (H) 24 - 336 ng/mL    Comment: Performed at [facility], [address], [place], [place] [address]     Assessment & Plan  1. Dry cough  - DG Chest 2 View; Future - CBC - azithromycin (ZITHROMAX) 250 MG tablet; 500 mg first day after that one daily  Dispense: 6 tablet; Refill: 0  2. Need for vaccination for pneumococcus  - Pneumococcal polysaccharide vaccine 23-valent greater than or equal to 2yo subcutaneous/IM  3. Mild intermittent asthma with acute exacerbation  Continue inhaler.   4. Primary osteoarthritis of left hip    5. Other fatigue  - DG Chest 2 View; Future - CBC - cefTRIAXone (ROCEPHIN) injection 1 g - azithromycin (ZITHROMAX) 250 MG tablet; 500 mg first day after that one daily  Dispense: 6 tablet; Refill: 0  6. SOB (shortness of breath)  - DG Chest 2 View; Future - CBC - cefTRIAXone (ROCEPHIN) injection 1 g - azithromycin (ZITHROMAX) 250 MG tablet; 500 mg first day after that one daily  Dispense: 6 tablet; Refill: 0

## 2017-07-03 ENCOUNTER — Telehealth: Payer: Self-pay

## 2017-07-03 NOTE — Telephone Encounter (Signed)
What would they pay for? Does it need to go to mail order?

## 2017-07-03 NOTE — Telephone Encounter (Signed)
Fax from the pharmacy stated that the amount has exceeded the plan limitation for [facility]

## 2017-07-10 ENCOUNTER — Ambulatory Visit: Payer: Medicare Other | Admitting: Nurse Practitioner

## 2017-07-13 ENCOUNTER — Ambulatory Visit
Admission: RE | Admit: 2017-07-13 | Discharge: 2017-07-13 | Disposition: A | Discharge status: Home / Self Care | Payer: Medicare Other | Source: Ambulatory Visit | Attending: Orthopedic Surgery | Admitting: Orthopedic Surgery

## 2017-07-13 DIAGNOSIS — M25552 Pain in left hip: Secondary | ICD-10-CM | POA: Diagnosis not present

## 2017-07-13 MED ORDER — IOPAMIDOL (ISOVUE-M 200) INJECTION 41%
1.0000 mL | Freq: Once | INTRAMUSCULAR | Status: AC
  Administered 2017-07-13: 1 mL via INTRA_ARTICULAR

## 2017-07-13 MED ORDER — METHYLPREDNISOLONE ACETATE 40 MG/ML INJ SUSP (RADIOLOG
120.0000 mg | Freq: Once | INTRAMUSCULAR | Status: AC
  Administered 2017-07-13: 120 mg via INTRA_ARTICULAR

## 2017-07-13 NOTE — Telephone Encounter (Signed)
I believe this issue has been resolved.

## 2017-07-13 NOTE — Discharge Instructions (Signed)

## 2017-07-16 ENCOUNTER — Other Ambulatory Visit: Payer: Medicare Other

## 2017-07-28 ENCOUNTER — Other Ambulatory Visit: Payer: Self-pay | Admitting: Family Medicine

## 2017-07-28 DIAGNOSIS — I824Z2 Acute embolism and thrombosis of unspecified deep veins of left distal lower extremity: Secondary | ICD-10-CM

## 2017-07-28 DIAGNOSIS — M545 Low back pain, unspecified: Secondary | ICD-10-CM

## 2017-07-28 NOTE — Telephone Encounter (Signed)
Refill request for general medication. T[facility]anidine to [facility]  Last office visit 07/02/17   Follow up on 09/15/2017

## 2017-07-28 NOTE — Telephone Encounter (Addendum)
Refill request for general medication. Eliquis to [facility].   Last office visit: 07/02/17  Follow up on 09/15/2017

## 2017-07-30 ENCOUNTER — Other Ambulatory Visit: Payer: Self-pay | Admitting: Family Medicine

## 2017-07-30 ENCOUNTER — Telehealth: Payer: Self-pay | Admitting: Family Medicine

## 2017-07-30 MED ORDER — TADALAFIL 5 MG PO TABS: 5.0000 mg | ORAL_TABLET | Freq: Every day | ORAL | 5 refills | Status: DC | PRN

## 2017-07-30 NOTE — Progress Notes (Signed)
c 

## 2017-07-30 NOTE — Telephone Encounter (Unsigned)
Copied from [place] [phone]. Topic: General - Other >> Jul 30, 2017 10:25 AM Judyann Munson wrote: Reason for CRM: Patient wife Neoma Laming is calling to advise that the medication CIALIS 5 MG tablet  is now expensive and she is wanting to know if the patient can try the generic brand  called Tadalafil.  Please advise The best pharmacy is [id number] [address], [place] (Phone) [phone] (Fax)

## 2017-08-11 ENCOUNTER — Other Ambulatory Visit: Payer: Self-pay | Admitting: Family Medicine

## 2017-08-11 NOTE — Telephone Encounter (Signed)
Copied from [place] [id number]. Topic: Quick Communication - Rx Refill/Question >> Aug 11, 2017  1:51 PM Margot Ables wrote: Medication: BREO ELLIPTA 200-25 MCG/INH AEPB - pt wife called stating that pharmacy advised new RX is needed and pt wants to stay with the Breo 249mcg - the 143mcg that he tried was not enough Has the patient contacted their pharmacy?yes Preferred Pharmacy (with phone number or street name): [id number] [address], [place] [phone] (Phone) [phone] (Fax

## 2017-08-12 MED ORDER — BREO ELLIPTA 200-25 MCG/INH IN AEPB: 1.0000 | INHALATION_SPRAY | Freq: Every day | RESPIRATORY_TRACT | 2 refills | Status: DC

## 2017-08-19 ENCOUNTER — Other Ambulatory Visit: Payer: Self-pay | Admitting: Family Medicine

## 2017-08-19 DIAGNOSIS — M545 Low back pain, unspecified: Secondary | ICD-10-CM

## 2017-08-19 MED ORDER — TIZANIDINE HCL 2 MG PO TABS: 2.0000 mg | ORAL_TABLET | Freq: Three times a day (TID) | ORAL | 0 refills | Status: DC | PRN

## 2017-08-19 NOTE — Telephone Encounter (Signed)
Copied from [place] [id number]. Topic: Quick Communication - Rx Refill/Question >> Aug 19, 2017 10:43 AM Gardiner Ramus wrote: Medication:tiZANidine (ZANAFLEX) 2 MG tablet [[id number]] pt does not have 3 day supply   Has the patient contacted their pharmacy?no  Preferred Pharmacy (with phone number or street name): [id number] [address], [place] [phone] (Phone) [phone] (Fax)  Agent: Please be advised that RX refills may take up to 3 business days. We ask that you follow-up with your pharmacy.

## 2017-08-25 ENCOUNTER — Inpatient Hospital Stay: Payer: Medicare Other | Attending: Oncology | Admitting: Oncology

## 2017-08-25 ENCOUNTER — Encounter: Payer: Self-pay | Admitting: Oncology

## 2017-08-25 ENCOUNTER — Inpatient Hospital Stay: Payer: Medicare Other

## 2017-08-25 VITALS — BP 105/66 | HR 77 | Temp 97.4°F | Resp 18 | Ht 74.0 in | Wt 274.0 lb

## 2017-08-25 DIAGNOSIS — D649 Anemia, unspecified: Secondary | ICD-10-CM

## 2017-08-25 DIAGNOSIS — D509 Iron deficiency anemia, unspecified: Secondary | ICD-10-CM

## 2017-08-25 DIAGNOSIS — D631 Anemia in chronic kidney disease: Secondary | ICD-10-CM | POA: Diagnosis not present

## 2017-08-25 DIAGNOSIS — N189 Chronic kidney disease, unspecified: Secondary | ICD-10-CM

## 2017-08-25 DIAGNOSIS — D638 Anemia in other chronic diseases classified elsewhere: Secondary | ICD-10-CM

## 2017-08-25 DIAGNOSIS — Z862 Personal history of diseases of the blood and blood-forming organs and certain disorders involving the immune mechanism: Secondary | ICD-10-CM

## 2017-08-25 LAB — COMPREHENSIVE METABOLIC PANEL
ALBUMIN: 4 g/dL (ref 3.5–5.0)
ALK PHOS: 87 U/L (ref 38–126)
ALT: 20 U/L (ref 0–44)
ANION GAP: 11 (ref 5–15)
AST: 31 U/L (ref 15–41)
BILIRUBIN TOTAL: 0.7 mg/dL (ref 0.3–1.2)
BUN: 31 mg/dL — ABNORMAL HIGH (ref 8–23)
CALCIUM: 9.3 mg/dL (ref 8.9–10.3)
CO2: 24 mmol/L (ref 22–32)
Chloride: 103 mmol/L (ref 98–111)
Creatinine, Ser: 1.56 mg/dL — ABNORMAL HIGH (ref 0.61–1.24)
GFR calc Af Amer: 52 mL/min — ABNORMAL LOW (ref >60)
GFR calc non Af Amer: 45 mL/min — ABNORMAL LOW (ref >60)
Glucose, Bld: 137 mg/dL — ABNORMAL HIGH (ref 70–99)
POTASSIUM: 3.8 mmol/L (ref 3.5–5.1)
SODIUM: 138 mmol/L (ref 135–145)
TOTAL PROTEIN: 7.6 g/dL (ref 6.5–8.1)

## 2017-08-25 LAB — CBC
HCT: 35.9 % — ABNORMAL LOW (ref 40.0–52.0)
Hemoglobin: 12 g/dL — ABNORMAL LOW (ref 13.0–18.0)
MCH: 29.2 pg (ref 26.0–34.0)
MCHC: 33.3 g/dL (ref 32.0–36.0)
MCV: 87.8 fL (ref 80.0–100.0)
PLATELETS: 236 10*3/uL (ref 150–440)
RBC: 4.09 MIL/uL — AB (ref 4.40–5.90)
RDW: 13.9 % (ref 11.5–14.5)
WBC: 6.7 10*3/uL (ref 3.8–10.6)

## 2017-08-25 LAB — IRON AND TIBC
Iron: 33 ug/dL — ABNORMAL LOW (ref 45–182)
SATURATION RATIOS: 12 % — AB (ref 17.9–39.5)
TIBC: 274 ug/dL (ref 250–450)
UIBC: 241 ug/dL

## 2017-08-25 LAB — FOLATE: Folate: 21.3 ng/mL (ref >5.9)

## 2017-08-25 LAB — FERRITIN: FERRITIN: 319 ng/mL (ref 24–336)

## 2017-08-27 NOTE — Progress Notes (Signed)
Hematology/Oncology Consult note [facility]  [phone] [phone]  Patient Care Team: Steele Sizer, MD as PCP - General (Family Medicine) Dagoberto Ligas, MD as Referring Physician (Internal Medicine) Dorna Leitz, MD as Consulting Physician (Orthopedic Surgery)   Name of the patient: [name]  [id number]  1950/10/01   Date of visit: 08/27/17  Diagnosis- anemia probably secondary to chronic disease/chronic kidney disease with some component of iron deficiency  Chief complaint/ Reason for visit- routine f/u of anemia  Heme/Onc history: Patient is a 67 yr old male who is scheduled to undergo right total arthroplasty next month. He has been referred to [place] for his anemia prior to upcoming surgery. Recent cbc from 03/11/16 showed wbc of 6.5, H/H of 10.3/31.6 and platelet count of 210. TSH was normal at 1.63. LFTs were normal. Iron studies showed normal TIBC of 256, normal iron of 50 and iron saturation of 20%. Ferritin was levetaed at 631. 10 months prior to that ferritin was normal. 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 Other PMH includes HTN, OA, asthma.   Results of bloodwork from 05/05/2016 were as follows: CBC showed white count of 7.3, H&H of 10.1/30.4 and a platelet count of 234. CMP was normal except for elevated BUN of 50 and creatinine of 1.97. Ferritin was elevated at 430. Iron studies showed iron saturation of 20%. Coombs test was negative, folate level was normal and B12 level was elevated at 1170. ESR was elevated at 46. Myeloma panel did not reveal any monoclonal protein. epo level was 13.7  Patient received40000weekly X 4 doses Day -21, Day-14 Day -7 and on the day of knee surgery. His hb went up to 12.9. He did not require blood transfusion post surgery  Patient sees nephrology for his CKD as well. He has not required EPO for anemia of CKD so far.  He received 2 doses of  Feraheme in March 2019   Interval history-overall he is doing well and has recovered well from his bilateral knee surgeries.  However his left hip has been giving him some pain and was seen by orthopedics for the same.  At some point he would need left hip surgery down the line but as of now plan is to continue with conservative management and pain medications  ECOG PS- 1 Pain scale- 3 Opioid associated constipation- no  Review of systems- Review of Systems  Constitutional: Negative for chills, fever, malaise/fatigue and weight loss.  HENT: Negative for congestion, ear discharge and nosebleeds.   Eyes: Negative for blurred vision.  Respiratory: Negative for cough, hemoptysis, sputum production, shortness of breath and wheezing.   Cardiovascular: Negative for chest pain, palpitations, orthopnea and claudication.  Gastrointestinal: Negative for abdominal pain, blood in stool, constipation, diarrhea, heartburn, melena, nausea and vomiting.  Genitourinary: Negative for dysuria, flank pain, frequency, hematuria and urgency.  Musculoskeletal: Positive for joint pain. Negative for back pain and myalgias.  Skin: Negative for rash.  Neurological: Negative for dizziness, tingling, focal weakness, seizures, weakness and headaches.  Endo/Heme/Allergies: Does not bruise/bleed easily.  Psychiatric/Behavioral: Negative for depression and suicidal ideas. The patient does not have insomnia.       Allergies  Allergen Reactions  . Lactose Intolerance (Gi) Other (See Comments)    MIGRAINES  . Shellfish Allergy Other (See Comments)    Congestion/breathing problems/migraines.     Past Medical History:  Diagnosis Date  . Allergic rhinitis   . Anemia   . BPH (  benign prostatic hyperplasia)   . Chronic sinusitis   . Complication of anesthesia    work up during surgery 2x in the past   . Decreased libido   . ED (erectile dysfunction)   . Fatigue   . Hematuria   . HTN (hypertension)   .  Hypogonadism in male   . Hypokalemia    history of   . IBS (irritable bowel syndrome)   . Low serum vitamin D   . Lumbago   . Migraine   . Mild intermittent asthma   . Reflux   . Shingles   . Unilateral inguinal hernia without obstruction or gangrene      Past Surgical History:  Procedure Laterality Date  . ADRENALECTOMY Left 02/11/2016   [facility]  . COLONOSCOPY  02/2012   normal  . JOINT REPLACEMENT    . KNEE ARTHROSCOPY Left 10/06/2009  . SINUS EXPLORATION    . TOTAL KNEE ARTHROPLASTY Right 06/20/2016  . TOTAL KNEE ARTHROPLASTY Right 06/20/2016   Procedure: TOTAL KNEE ARTHROPLASTY;  Surgeon: Dorna Leitz, MD;  Location: [facility];  Service: Orthopedics;  Laterality: Right;  . TOTAL KNEE ARTHROPLASTY Left 12/19/2016   Procedure: LEFT TOTAL KNEE ARTHROPLASTY;  Surgeon: Dorna Leitz, MD;  Location: [facility];  Service: Orthopedics;  Laterality: Left;  Adductor Block    Social History   Socioeconomic History  . Marital status: Married    Spouse name: Not on file  . Number of children: Not on file  . Years of education: Not on file  . Highest education level: Not on file  Occupational History  . Not on file  Social Needs  . Financial resource strain: Not on file  . Food insecurity:    Worry: Not on file    Inability: Not on file  . Transportation needs:    Medical: Not on file    Non-medical: Not on file  Tobacco Use  . Smoking status: Former Smoker    Packs/day: 1.00    Years: 10.00    Pack years: 10.00  . Smokeless tobacco: Never Used  . Tobacco comment: 38 years ago 70 when he stopped  Substance and Sexual Activity  . Alcohol use: No    Alcohol/week: 0.0 standard drinks  . Drug use: No  . Sexual activity: Yes    Partners: Female  Lifestyle  . Physical activity:    Days per week: Not on file    Minutes per session: Not on file  . Stress: Not on file  Relationships  . Social connections:    Talks on phone: Not on file    Gets together: Not on file    Attends  religious service: Not on file    Active member of club or organization: Not on file    Attends meetings of clubs or organizations: Not on file    Relationship status: Not on file  . Intimate partner violence:    Fear of current or ex partner: Not on file    Emotionally abused: Not on file    Physically abused: Not on file    Forced sexual activity: Not on file  Other Topics Concern  . Not on file  Social History Narrative  . Not on file    Family History  Problem Relation Age of Onset  . Diabetes Mother   . Heart disease Mother   . Lung disease Mother   . Seizures Maternal Grandmother      Current Outpatient Medications:  .  azelastine (OPTIVAR)  0.05 % ophthalmic solution, INSTILL 2 DROPS INTO BOTH EYES TWICE A DAY, Disp: 6 mL, Rfl: 5 .  BREO ELLIPTA 200-25 MCG/INH AEPB, Inhale 1 puff into the lungs daily., Disp: 60 each, Rfl: 2 .  ELIQUIS 5 MG TABS tablet, TAKE 1 TABLET BY MOUTH TWICE A DAY, Disp: 60 tablet, Rfl: 5 .  fluticasone (FLONASE) 50 MCG/ACT nasal spray, Place 2 sprays into both nostrils daily., Disp: 16 g, Rfl: 5 .  Glucosamine-MSM-Hyaluronic Acd (JOINT HEALTH PO), Take 1 tablet by mouth daily. INSTAFLEX ADVANCED JOINT SUPPORT, Disp: , Rfl:  .  Lactobacillus Rhamnosus, GG, (CULTURELLE) CAPS, Take by mouth., Disp: , Rfl:  .  Misc Natural Products (NF FORMULAS TESTOSTERONE PO), Take 2 tablets by mouth daily. NATURE'S PLUS T-MALE SUPPLEMENT, Disp: , Rfl:  .  montelukast (SINGULAIR) 10 MG tablet, Take 1 tablet (10 mg total) by mouth daily., Disp: 30 tablet, Rfl: 5 .  Multiple Vitamins-Minerals (MULTIVITAMIN ADULTS 50+ PO), Take 1 Package by mouth daily. NATURE'S CODE MEN OVER 50 MULTIVITAMIN PACK, Disp: , Rfl:  .  OVER THE COUNTER MEDICATION, Take 1 tablet by mouth daily. TEST-HD TESTOSTERONE SUPPORT, Disp: , Rfl:  .  patiromer (VELTASSA) 8.4 g packet, Take 8.4 g by mouth at bedtime., Disp: , Rfl:  .  tadalafil (CIALIS) 5 MG tablet, Take 1 tablet (5 mg total) by mouth  daily as needed for erectile dysfunction., Disp: 30 tablet, Rfl: 5 .  tiZANidine (ZANAFLEX) 2 MG tablet, Take 1 tablet (2 mg total) by mouth every 8 (eight) hours as needed for muscle spasms., Disp: 60 tablet, Rfl: 0 .  traMADol (ULTRAM) 50 MG tablet, Take by mouth., Disp: , Rfl:  .  albuterol (PROAIR HFA) 108 (90 Base) MCG/ACT inhaler, Inhale 2 puffs into the lungs every 6 (six) hours as needed for wheezing or shortness of breath. (Patient not taking: Reported on 08/25/2017), Disp: 1 Inhaler, Rfl: 0 .  azithromycin (ZITHROMAX) 250 MG tablet, 500 mg first day after that one daily (Patient not taking: Reported on 08/25/2017), Disp: 6 tablet, Rfl: 0 .  docusate sodium (COLACE) 100 MG capsule, Take 1 capsule (100 mg total) by mouth 2 (two) times daily. (Patient not taking: Reported on 08/25/2017), Disp: 30 capsule, Rfl: 0 .  fexofenadine (ALLEGRA) 180 MG tablet, Take 180 mg by mouth daily as needed for allergies or rhinitis., Disp: , Rfl:  .  fexofenadine-pseudoephedrine (ALLEGRA-D 24) 180-240 MG 24 hr tablet, Take 1 tablet by mouth daily as needed (for allergies.)., Disp: , Rfl:  .  Polyethyl Glycol-Propyl Glycol (SYSTANE ULTRA) 0.4-0.3 % SOLN, Place 1-2 drops into both eyes 3 (three) times daily as needed (for dry/irritated eyes.)., Disp: , Rfl:  .  tamsulosin (FLOMAX) 0.4 MG CAPS capsule, Take 1 capsule (0.4 mg total) by mouth every evening. (Patient not taking: Reported on 07/02/2017), Disp: 30 capsule, Rfl: 5  Current Facility-Administered Medications:  .  cefTRIAXone (ROCEPHIN) injection 1 g, 1 g, Intramuscular, Q24H, Sowles, Drue Stager, MD, 1 g at 07/02/17 1241  Physical exam:  Vitals:   08/25/17 1442  BP: 105/66  Pulse: 77  Resp: 18  Temp: (!) 97.4 F (36.3 C)  TempSrc: Tympanic  SpO2: 96%  Weight: 274 lb (124.3 kg)  Height: 6' 2"  (1.88 m)   Physical Exam  Constitutional: He is oriented to person, place, and time. He appears well-developed and well-nourished.  He is ambulating with a cane    HENT:  Head: Normocephalic and atraumatic.  Eyes: Pupils are equal, round, and reactive to  light. EOM are normal.  Neck: Normal range of motion.  Cardiovascular: Normal rate, regular rhythm and normal heart sounds.  Pulmonary/Chest: Effort normal and breath sounds normal.  Abdominal: Soft. Bowel sounds are normal.  Neurological: He is alert and oriented to person, place, and time.  Skin: Skin is warm and dry.     CMP Latest Ref Rng & Units 08/25/2017  Glucose 70 - 99 mg/dL 137(H)  BUN 8 - 23 mg/dL 31(H)  Creatinine 0.61 - 1.24 mg/dL 1.56(H)  Sodium 135 - 145 mmol/L 138  Potassium 3.5 - 5.1 mmol/L 3.8  Chloride 98 - 111 mmol/L 103  CO2 22 - 32 mmol/L 24  Calcium 8.9 - 10.3 mg/dL 9.3  Total Protein 6.5 - 8.1 g/dL 7.6  Total Bilirubin 0.3 - 1.2 mg/dL 0.7  Alkaline Phos 38 - 126 U/L 87  AST 15 - 41 U/L 31  ALT 0 - 44 U/L 20   CBC Latest Ref Rng & Units 08/25/2017  WBC 3.8 - 10.6 K/uL 6.7  Hemoglobin 13.0 - 18.0 g/dL 12.0(L)  Hematocrit 40.0 - 52.0 % 35.9(L)  Platelets 150 - 440 K/uL 236    Assessment and plan- Patient is a 67 y.o. male with normocytic anemia likely secondary to anemia of chronic disease/anemia of chronic kidney disease with some component of iron deficiency  Today patient's hemoglobin is 12 and his iron studies are normal.  He does not require any EPo or IV iron at this time.  Also his kidney functions are stable and he continues to follow-up with nephrology.  Repeat CBC ferritin and iron studies in 3 in 6 months and I will see him back in 6 months   Visit Diagnosis 1. History of anemia due to chronic kidney disease   2. Anemia of chronic disease   3. Iron deficiency anemia, unspecified iron deficiency anemia type      Dr. Randa Evens, MD, MPH [facility] at [facility] [phone] 08/27/2017 9:10 AM

## 2017-09-15 ENCOUNTER — Encounter: Payer: Self-pay | Admitting: Family Medicine

## 2017-09-15 ENCOUNTER — Ambulatory Visit (INDEPENDENT_AMBULATORY_CARE_PROVIDER_SITE_OTHER): Payer: Medicare Other | Admitting: Family Medicine

## 2017-09-15 VITALS — BP 124/84 | HR 105 | Temp 98.1°F | Resp 16 | Ht 72.0 in | Wt 273.3 lb

## 2017-09-15 DIAGNOSIS — J4540 Moderate persistent asthma, uncomplicated: Secondary | ICD-10-CM | POA: Diagnosis not present

## 2017-09-15 DIAGNOSIS — M545 Low back pain, unspecified: Secondary | ICD-10-CM

## 2017-09-15 DIAGNOSIS — Z23 Encounter for immunization: Secondary | ICD-10-CM

## 2017-09-15 DIAGNOSIS — N401 Enlarged prostate with lower urinary tract symptoms: Secondary | ICD-10-CM | POA: Diagnosis not present

## 2017-09-15 DIAGNOSIS — R739 Hyperglycemia, unspecified: Secondary | ICD-10-CM

## 2017-09-15 DIAGNOSIS — I700 Atherosclerosis of aorta: Secondary | ICD-10-CM | POA: Diagnosis not present

## 2017-09-15 DIAGNOSIS — N138 Other obstructive and reflux uropathy: Secondary | ICD-10-CM | POA: Diagnosis not present

## 2017-09-15 DIAGNOSIS — E896 Postprocedural adrenocortical (-medullary) hypofunction: Secondary | ICD-10-CM | POA: Diagnosis not present

## 2017-09-15 DIAGNOSIS — E785 Hyperlipidemia, unspecified: Secondary | ICD-10-CM | POA: Diagnosis not present

## 2017-09-15 DIAGNOSIS — Z0000 Encounter for general adult medical examination without abnormal findings: Secondary | ICD-10-CM

## 2017-09-15 MED ORDER — TAMSULOSIN HCL 0.4 MG PO CAPS: 0.4000 mg | ORAL_CAPSULE | Freq: Every evening | ORAL | 5 refills | Status: DC

## 2017-09-15 MED ORDER — TADALAFIL 5 MG PO TABS: 5.0000 mg | ORAL_TABLET | Freq: Every day | ORAL | 5 refills | Status: DC | PRN

## 2017-09-15 MED ORDER — ALBUTEROL SULFATE HFA 108 (90 BASE) MCG/ACT IN AERS: 2.0000 | INHALATION_SPRAY | Freq: Four times a day (QID) | RESPIRATORY_TRACT | 0 refills | Status: DC | PRN

## 2017-09-15 MED ORDER — TIZANIDINE HCL 2 MG PO TABS: 2.0000 mg | ORAL_TABLET | Freq: Three times a day (TID) | ORAL | 5 refills | Status: DC

## 2017-09-15 MED ORDER — BREO ELLIPTA 200-25 MCG/INH IN AEPB: 1.0000 | INHALATION_SPRAY | Freq: Every day | RESPIRATORY_TRACT | 5 refills | Status: DC

## 2017-09-15 NOTE — Patient Instructions (Signed)
Call insurance to find out if they for shingrix vaccine  Preventive Care 40 Years and Older, Male Preventive care refers to lifestyle choices and visits with your health care provider that can promote health and wellness. What does preventive care include?  A yearly physical exam. This is also called an annual well check.  Dental exams once or twice a year.  Routine eye exams. Ask your health care provider how often you should have your eyes checked.  Personal lifestyle choices, including: ? Daily care of your teeth and gums. ? Regular physical activity. ? Eating a healthy diet. ? Avoiding tobacco and drug use. ? Limiting alcohol use. ? Practicing safe sex. ? Taking low doses of aspirin every day. ? Taking vitamin and mineral supplements as recommended by your health care provider. What happens during an annual well check? The services and screenings done by your health care provider during your annual well check will depend on your age, overall health, lifestyle risk factors, and family history of disease. Counseling Your health care provider may ask you questions about your:  Alcohol use.  Tobacco use.  Drug use.  Emotional well-being.  Home and relationship well-being.  Sexual activity.  Eating habits.  History of falls.  Memory and ability to understand (cognition).  Work and work Statistician.  Screening You may have the following tests or measurements:  Height, weight, and BMI.  Blood pressure.  Lipid and cholesterol levels. These may be checked every 5 years, or more frequently if you are over 65 years old.  Skin check.  Lung cancer screening. You may have this screening every year starting at age 84 if you have a 30-pack-year history of smoking and currently smoke or have quit within the past 15 years.  Fecal occult blood test (FOBT) of the stool. You may have this test every year starting at age 7.  Flexible sigmoidoscopy or colonoscopy. You may  have a sigmoidoscopy every 5 years or a colonoscopy every 10 years starting at age 31.  Prostate cancer screening. Recommendations will vary depending on your family history and other risks.  Hepatitis C blood test.  Hepatitis B blood test.  Sexually transmitted disease (STD) testing.  Diabetes screening. This is done by checking your blood sugar (glucose) after you have not eaten for a while (fasting). You may have this done every 1-3 years.  Abdominal aortic aneurysm (AAA) screening. You may need this if you are a current or former smoker.  Osteoporosis. You may be screened starting at age 35 if you are at high risk.  Talk with your health care provider about your test results, treatment options, and if necessary, the need for more tests. Vaccines Your health care provider may recommend certain vaccines, such as:  Influenza vaccine. This is recommended every year.  Tetanus, diphtheria, and acellular pertussis (Tdap, Td) vaccine. You may need a Td booster every 10 years.  Varicella vaccine. You may need this if you have not been vaccinated.  Zoster vaccine. You may need this after age 66.  Measles, mumps, and rubella (MMR) vaccine. You may need at least one dose of MMR if you were born in 1957 or later. You may also need a second dose.  Pneumococcal 13-valent conjugate (PCV13) vaccine. One dose is recommended after age 19.  Pneumococcal polysaccharide (PPSV23) vaccine. One dose is recommended after age 56.  Meningococcal vaccine. You may need this if you have certain conditions.  Hepatitis A vaccine. You may need this if you have certain  conditions or if you travel or work in places where you may be exposed to hepatitis A.  Hepatitis B vaccine. You may need this if you have certain conditions or if you travel or work in places where you may be exposed to hepatitis B.  Haemophilus influenzae type b (Hib) vaccine. You may need this if you have certain risk factors.  Talk to  your health care provider about which screenings and vaccines you need and how often you need them. This information is not intended to replace advice given to you by your health care provider. Make sure you discuss any questions you have with your health care provider. Document Released: 01/19/2015 Document Revised: 09/12/2015 Document Reviewed: 10/24/2014 Elsevier Interactive Patient Education  [employer].

## 2017-09-15 NOTE — Progress Notes (Addendum)
Name: [name]   MRN: [id number]    DOB: 10/01/1950   Date:09/15/2017       Progress Note  Subjective  Chief Complaint  Chief Complaint  Patient presents with  . Medicare Wellness  . Medication Refill    HPI  Patient presents for annual CPE and follow up.  HTN:bp has normalized since adrenal gland removed.  Obese: he has lost almost 20 lbs since March 2018 he gained some back and now weight has been stable. He tries to eat healthy, but they still buy junk food.   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 Last PSA was normal. Recheck PSA  CKI stage III: and hyperkalemia, under the care of Dr. Holley Raring. No pruritus.   Dyslipidemia: he is not on medication, low HDl recheck labs.   USPSTF grade A and B recommendations:  Diet: balanced diet    Depression:  Depression screen [facility] 2/9 09/15/2017 09/15/2017 06/02/2017 11/26/2016 10/10/2015  Decreased Interest 0 0 0 0 0  Down, Depressed, Hopeless 0 0 0 0 0  PHQ - 2 Score 0 0 0 0 0  Altered sleeping 0 - - - -  Tired, decreased energy 1 - - - -  Change in appetite 0 - - - -  Feeling bad or failure about yourself  0 - - - -  Trouble concentrating 0 - - - -  Moving slowly or fidgety/restless 0 - - - -  Suicidal thoughts 0 - - - -  PHQ-9 Score 1 - - - -  Difficult doing work/chores Not difficult at all - - - -    Hypertension:  BP Readings from Last 3 Encounters:  09/15/17 124/84  08/25/17 105/66  07/02/17 134/82    Obesity: Wt Readings from Last 3 Encounters:  09/15/17 273 lb 4.8 oz  (124 kg)  08/25/17 274 lb (124.3 kg)  07/02/17 263 lb 6.4 oz (119.5 kg)   BMI Readings from Last 3 Encounters:  09/15/17 37.07 kg/m  08/25/17 35.18 kg/m  07/02/17 33.82 kg/m     Lipids:  Lab Results  Component Value Date   CHOL 189 01/26/2017   CHOL 201 (H) 06/20/2015   CHOL 175 10/31/2014   Lab Results  Component Value Date   HDL 48 01/26/2017   HDL 50 06/20/2015   HDL 39 (L) 10/31/2014   Lab Results  Component Value Date   LDLCALC 118 (H) 01/26/2017   LDLCALC 129 (H) 06/20/2015   LDLCALC 116 (H) 10/31/2014   Lab Results  Component Value Date   TRIG 122 01/26/2017   TRIG 111 06/20/2015   TRIG 99 10/31/2014   Lab Results  Component Value Date   CHOLHDL 3.9 01/26/2017   CHOLHDL 4.0 06/20/2015   CHOLHDL 4.5 10/31/2014   No results found for: LDLDIRECT Glucose:  Glucose, Bld  Date Value Ref Range Status  08/25/2017 137 (H) 70 - 99 mg/dL Final  01/26/2017 107 (H) 65 - 99 mg/dL Final    Comment:    .  Fasting reference interval . For someone without known diabetes, a glucose value between 100 and 125 mg/dL is consistent with prediabetes and should be confirmed with a follow-up test. .   12/21/2016 143 (H) 65 - 99 mg/dL Final      Office Visit from 09/15/2017 in [facility]  AUDIT-C Score  0       Married STD testing and prevention (HIV/chl/gon/syphilis): N/A   Skin cancer: no atypical lesions  Colorectal cancer: 2014 Prostate cancer: check PSA today because of BPH  Lab Results  Component Value Date   PSA 1.9 06/01/2013    IPSS Questionnaire (AUA-7): Over the past month.   1)  How often have you had a sensation of not emptying your bladder completely after you finish urinating?  1 - Less than 1 time in 5  2)  How often have you had to urinate again less than two hours after you finished urinating? 0 - Not at all  3)  How often have you found you stopped and started again several times when you urinated?  1-  less than 1 time in 5  4) How difficult have you found it to postpone urination?  0 - Not at all  5) How often have you had a weak urinary stream?  3 - About half the time  6) How often have you had to push or strain to begin urination?  1 - Less than 1 time in 5  7) How many times did you most typically get up to urinate from the time you went to bed until the time you got up in the morning?  2 - 2 times  Total score:  0-7 mildly symptomatic   8-19 moderately symptomatic   20-35 severely symptomatic   Lung cancer:  Low Dose CT Chest recommended if Age 45-80 years, 30 pack-year currently smoking OR have quit w/in 15years. Patient does not qualify.   AAA: had CT 2017  ECG:  2018   Advanced Care Planning: A voluntary discussion about advance care planning including the explanation and discussion of advance directives.  Discussed health care proxy and Living will, and the patient was able to identify a health care proxy as wife. Patient does not have a living will at present time.  Patient Active Problem List   Diagnosis Date Noted  . 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 Primary osteoarthritis of left knee 12/19/2016  . Anemia, unspecified 04/30/2016  . History of benign neoplasm of adrenal gland 02/11/2016  . History of iron deficiency anemia 10/10/2015  . BPH (benign prostatic hyperplasia) 06/20/2015  . ED (erectile dysfunction) 06/20/2015  . Allergic rhinitis, seasonal 06/20/2015  . Anemia of chronic disease 06/20/2015  . Hypogonadism in male 06/20/2015  . Asthma, well controlled, moderate persistent 06/20/2015  . Hypertension, benign 06/20/2015  . Hyperglycemia 06/20/2015  . History of shingles 06/20/2015  . GERD without esophagitis 06/20/2015  . Chronic radicular low back pain 06/20/2015  . History of epilepsy 06/20/2015  . Migraine without aura and without status migrainosus, not intractable 06/20/2015  . Dyslipidemia 06/20/2015  .  Primary osteoarthritis of both knees 06/20/2015    Past Surgical History:  Procedure Laterality Date  . ADRENALECTOMY Left 02/11/2016   [facility]  . COLONOSCOPY  02/2012   normal  . JOINT REPLACEMENT    . KNEE ARTHROSCOPY Left 10/06/2009  . SINUS EXPLORATION    . TOTAL KNEE ARTHROPLASTY Right 06/20/2016  . TOTAL KNEE  ARTHROPLASTY Right 06/20/2016   Procedure: TOTAL KNEE ARTHROPLASTY;  Surgeon: Dorna Leitz, MD;  Location: [facility];  Service: Orthopedics;  Laterality: Right;  . TOTAL KNEE ARTHROPLASTY Left 12/19/2016   Procedure: LEFT TOTAL KNEE ARTHROPLASTY;  Surgeon: Dorna Leitz, MD;  Location: [facility];  Service: Orthopedics;  Laterality: Left;  Adductor Block    Family History  Problem Relation Age of Onset  . Diabetes Mother   . Heart disease Mother   . Lung disease Mother   . Seizures Maternal Grandmother     Social History   Socioeconomic History  . Marital status: Married    Spouse name: Not on file  . Number of children: Not on file  . Years of education: Not on file  . Highest education level: Professional school degree (e.g., MD, DDS, DVM, JD)  Occupational History  . Occupation: [employer]  Social Needs  . Financial resource strain: Not hard at all  . Food insecurity:    Worry: Never true    Inability: Never true  . Transportation needs:    Medical: No    Non-medical: No  Tobacco Use  . Smoking status: Former Smoker    Packs/day: 1.00    Years: 10.00    Pack years: 10.00  . Smokeless tobacco: Never Used  . Tobacco comment: 38 years ago 1 when he stopped  Substance and Sexual Activity  . Alcohol use: No    Alcohol/week: 0.0 standard drinks  . Drug use: No  . Sexual activity: Yes    Partners: Female  Lifestyle  . Physical activity:    Days per week: 0 days    Minutes per session: 0 min  . Stress: Not at all  Relationships  . Social connections:    Talks on phone: Twice a week    Gets together: Three times a week    Attends religious  service: More than 4 times per year    Active member of club or organization: Yes    Attends meetings of clubs or organizations: More than 4 times per year    Relationship status: Married  . Intimate partner violence:    Fear of current or ex partner: No    Emotionally abused: No    Physically abused: No    Forced sexual activity: No  Other Topics Concern  . Not on file  Social History Narrative  . Not on file     Current Outpatient Medications:  .  albuterol (PROAIR HFA) 108 (90 Base) MCG/ACT inhaler, Inhale 2 puffs into the lungs every 6 (six) hours as needed for wheezing or shortness of breath., Disp: 1 Inhaler, Rfl: 0 .  azelastine (OPTIVAR) 0.05 % ophthalmic solution, INSTILL 2 DROPS INTO BOTH EYES TWICE A DAY, Disp: 6 mL, Rfl: 5 .  BREO ELLIPTA 200-25 MCG/INH AEPB, Inhale 1 puff into the lungs daily., Disp: 60 each, Rfl: 5 .  docusate sodium (COLACE) 100 MG capsule, Take 1 capsule (100 mg total) by mouth 2 (two) times daily., Disp: 30 capsule, Rfl: 0 .  ELIQUIS 5 MG TABS tablet, TAKE 1 TABLET BY MOUTH TWICE A DAY, Disp: 60 tablet, Rfl: 5 .  fexofenadine (ALLEGRA) 180 MG tablet, Take 180 mg by mouth daily as needed for allergies or rhinitis., Disp: , Rfl:  .  fluticasone (FLONASE) 50 MCG/ACT nasal spray, Place 2 sprays into both nostrils daily., Disp: 16 g, Rfl: 5 .  Glucosamine-MSM-Hyaluronic Acd (JOINT HEALTH PO), Take 1 tablet by mouth daily. INSTAFLEX ADVANCED JOINT  SUPPORT, Disp: , Rfl:  .  Lactobacillus Rhamnosus, GG, (CULTURELLE) CAPS, Take by mouth., Disp: , Rfl:  .  Misc Natural Products (NF FORMULAS TESTOSTERONE PO), Take 2 tablets by mouth daily. NATURE'S PLUS T-MALE SUPPLEMENT, Disp: , Rfl:  .  montelukast (SINGULAIR) 10 MG tablet, Take 1 tablet (10 mg total) by mouth daily., Disp: 30 tablet, Rfl: 5 .  Multiple Vitamins-Minerals (MULTIVITAMIN ADULTS 50+ PO), Take 1 Package by mouth daily. NATURE'S CODE MEN OVER 50 MULTIVITAMIN PACK, Disp: , Rfl:  .  patiromer (VELTASSA)  8.4 g packet, Take 8.4 g by mouth at bedtime., Disp: , Rfl:  .  Polyethyl Glycol-Propyl Glycol (SYSTANE ULTRA) 0.4-0.3 % SOLN, Place 1-2 drops into both eyes 3 (three) times daily as needed (for dry/irritated eyes.)., Disp: , Rfl:  .  tamsulosin (FLOMAX) 0.4 MG CAPS capsule, Take 1 capsule (0.4 mg total) by mouth every evening., Disp: 30 capsule, Rfl: 5 .  tiZANidine (ZANAFLEX) 2 MG tablet, Take 1 tablet (2 mg total) by mouth 3 (three) times daily., Disp: 90 tablet, Rfl: 5 .  traMADol (ULTRAM) 50 MG tablet, Take by mouth., Disp: , Rfl:  .  fexofenadine-pseudoephedrine (ALLEGRA-D 24) 180-240 MG 24 hr tablet, Take 1 tablet by mouth daily as needed (for allergies.)., Disp: , Rfl:  .  OVER THE COUNTER MEDICATION, Take 1 tablet by mouth daily. TEST-HD TESTOSTERONE SUPPORT, Disp: , Rfl:  .  tadalafil (CIALIS) 5 MG tablet, Take 1 tablet (5 mg total) by mouth daily as needed for erectile dysfunction., Disp: 30 tablet, Rfl: 5  Allergies  Allergen Reactions  . Lactose Intolerance (Gi) Other (See Comments)    MIGRAINES  . Shellfish Allergy Other (See Comments)    Congestion/breathing problems/migraines.     ROS  Constitutional: Negative for fever or weight change.  Respiratory: Negative for cough and shortness of breath.   Cardiovascular: Negative for chest pain or palpitations.  Gastrointestinal: Negative for abdominal pain, no bowel changes.  Musculoskeletal: Positive  for gait problem positive for intermittent  joint swelling.  Skin: Negative for rash.  Neurological: Negative for dizziness or headache.  No other specific complaints in a complete review of systems (except as listed in HPI above).  Objective  Vitals:   09/15/17 1442  BP: 124/84  Pulse: (!) 105  Resp: 16  Temp: 98.1 F (36.7 C)  TempSrc: Oral  SpO2: 98%  Weight: 273 lb 4.8 oz (124 kg)  Height: 6' (1.829 m)    Body mass index is 37.07 kg/m.  Physical Exam  Constitutional: Patient appears well-developed and obese.  No distress.  HENT: Head: Normocephalic and atraumatic. Ears: B TMs ok, no erythema or effusion; Nose: Nose normal. Mouth/Throat: Oropharynx is clear and moist. No oropharyngeal exudate.  Eyes: Conjunctivae and EOM are normal. Pupils are equal, round, and reactive to light. No scleral icterus.  Neck: Normal range of motion. Neck supple. No JVD present. No thyromegaly present.  Cardiovascular: Normal rate, regular rhythm and normal heart sounds.  No murmur heard. No BLE edema. Pulmonary/Chest: Effort normal and breath sounds normal. No respiratory distress. Abdominal: Soft. Bowel sounds are normal, no distension. There is no tenderness. no masses MALE GENITALIA: Normal descended testes bilaterally, no masses palpated, no hernias, no lesions, no discharge RECTAL: Prostate enlarged, small nodule on the right side - he states already biopsied and monitored and released from urologist and does not want to go back, no rectal masses or hemorrhoids Musculoskeletal: decrease in extension both knees but has over 90 degree of  flexion  Neurological: he is alert and oriented to person, place, and time. No cranial nerve deficit. Coordination, balance, strength, speech and gait are normal.  Skin: Skin is warm and dry. No rash noted. No erythema.  Psychiatric: Patient has a normal mood and affect. behavior is normal. Judgment and thought content normal.  Recent Results (from the past 2160 hour(s))  CBC     Status: Abnormal   Collection Time: 07/02/17  2:03 PM  Result Value Ref Range   WBC 5.1 3.8 - 10.6 K/uL   RBC 4.09 (L) 4.40 - 5.90 MIL/uL   Hemoglobin 11.6 (L) 13.0 - 18.0 g/dL   HCT 35.9 (L) 40.0 - 52.0 %   MCV 87.7 80.0 - 100.0 fL   MCH 28.5 26.0 - 34.0 pg   MCHC 32.4 32.0 - 36.0 g/dL   RDW 15.5 (H) 11.5 - 14.5 %   Platelets 240 150 - 440 K/uL    Comment: Performed at [facility], [address] [place], [place] [address]  CBC     Status: Abnormal   Collection Time: 08/25/17  1:59 PM   Result Value Ref Range   WBC 6.7 3.8 - 10.6 K/uL   RBC 4.09 (L) 4.40 - 5.90 MIL/uL   Hemoglobin 12.0 (L) 13.0 - 18.0 g/dL   HCT 35.9 (L) 40.0 - 52.0 %   MCV 87.8 80.0 - 100.0 fL   MCH 29.2 26.0 - 34.0 pg   MCHC 33.3 32.0 - 36.0 g/dL   RDW 13.9 11.5 - 14.5 %   Platelets 236 150 - 440 K/uL    Comment: Performed at [facility], [address] [place], [place] [address]  Ferritin     Status: None   Collection Time: 08/25/17  1:59 PM  Result Value Ref Range   Ferritin 319 24 - 336 ng/mL    Comment: Performed at [facility], [address] [place], [place] [address]  Folate     Status: None   Collection Time: 08/25/17  1:59 PM  Result Value Ref Range   Folate 21.3 >5.9 ng/mL    Comment: Performed at [facility], [address] [place], [place] [address]  Iron and TIBC     Status: Abnormal   Collection Time: 08/25/17  1:59 PM  Result Value Ref Range   Iron 33 (L) 45 - 182 ug/dL   TIBC 274 250 - 450 ug/dL   Saturation Ratios 12 (L) 17.9 - 39.5 %   UIBC 241 ug/dL    Comment: Performed at [facility], [address] [place],  [address]  Comprehensive metabolic panel     Status: Abnormal   Collection Time: 08/25/17  1:59 PM  Result Value Ref Range   Sodium 138 135 - 145 mmol/L   Potassium 3.8 3.5 - 5.1 mmol/L   Chloride 103 98 - 111 mmol/L   CO2 24 22 - 32 mmol/L   Glucose, Bld 137 (H) 70 - 99 mg/dL   BUN 31 (H) 8 - 23 mg/dL   Creatinine, Ser 1.56 (H) 0.61 - 1.24 mg/dL   Calcium 9.3 8.9 - 10.3 mg/dL   Total Protein 7.6 6.5 - 8.1 g/dL   Albumin 4.0 3.5 - 5.0 g/dL   AST 31 15 - 41 U/L   ALT 20 0 - 44 U/L   Alkaline Phosphatase 87 38 - 126 U/L   Total Bilirubin 0.7 0.3 - 1.2 mg/dL   GFR calc non Af Amer 45 (L) >60  mL/min   GFR calc Af Amer 52 (L) >60 mL/min    Comment: (NOTE) The eGFR has been calculated using the CKD EPI equation. This calculation has not been validated in all clinical situations. eGFR's persistently <60  mL/min signify possible Chronic Kidney Disease.    Anion gap 11 5 - 15    Comment: Performed at [facility], [address] [place], [place] [address]     PHQ2/9: Depression screen [facility] 2/9 09/15/2017 09/15/2017 06/02/2017 11/26/2016 10/10/2015  Decreased Interest 0 0 0 0 0  Down, Depressed, Hopeless 0 0 0 0 0  PHQ - 2 Score 0 0 0 0 0  Altered sleeping 0 - - - -  Tired, decreased energy 1 - - - -  Change in appetite 0 - - - -  Feeling bad or failure about yourself  0 - - - -  Trouble concentrating 0 - - - -  Moving slowly or fidgety/restless 0 - - - -  Suicidal thoughts 0 - - - -  PHQ-9 Score 1 - - - -  Difficult doing work/chores Not difficult at all - - - -     Fall Risk: Fall Risk  09/15/2017 06/02/2017 01/19/2017 11/26/2016 10/10/2015  Falls in the past year? Yes No No No No  Number falls in past yr: 2 or more - - - -  Injury with Fall? No - - - -     Functional Status Survey: Is the patient deaf or have difficulty hearing?: Yes Does the patient have difficulty seeing, even when wearing glasses/contacts?: No Does the patient have difficulty concentrating, remembering, or making decisions?: No Does the patient have difficulty walking or climbing stairs?: No Does the patient have difficulty dressing or bathing?: No Does the patient have difficulty doing errands alone such as visiting a doctor's office or shopping?: No   Assessment & Plan  1. Encounter for routine history and physical exam for male   2. Need for influenza vaccination  - Flu vaccine HIGH DOSE PF (Fluzone High dose)  3. Benign prostatic hyperplasia with urinary obstruction  - tadalafil (CIALIS) 5 MG tablet; Take 1 tablet (5 mg total) by mouth daily as needed for erectile dysfunction.  Dispense: 30 tablet; Refill: 5 - tamsulosin (FLOMAX) 0.4 MG CAPS capsule; Take 1 capsule (0.4 mg total) by mouth every evening.  Dispense: 30 capsule; Refill: 5 - PSA  4. Intermittent low back pain  - tiZANidine  (ZANAFLEX) 2 MG tablet; Take 1 tablet (2 mg total) by mouth 3 (three) times daily.  Dispense: 90 tablet; Refill: 5  5. Asthma, well controlled, moderate persistent  - BREO ELLIPTA 200-25 MCG/INH AEPB; Inhale 1 puff into the lungs daily.  Dispense: 60 each; Refill: 5 - albuterol (PROAIR HFA) 108 (90 Base) MCG/ACT inhaler; Inhale 2 puffs into the lungs every 6 (six) hours as needed for wheezing or shortness of breath.  Dispense: 1 Inhaler; Refill: 0  6. Dyslipidemia  - Lipid panel  7. Hyperglycemia  - Hemoglobin A1c  -Prostate cancer screening and PSA options (with potential risks and benefits of testing vs not testing) were discussed along with recent recs/guidelines. -USPSTF grade A and B recommendations reviewed with patient; age-appropriate recommendations, preventive care, screening tests, etc discussed and encouraged; healthy living encouraged; see AVS for patient education given to patient -Discussed importance of 150 minutes of physical activity weekly, eat two servings of fish weekly, eat one serving of tree nuts ( cashews, pistachios, pecans, almonds.Marland Kitchen) every other day, eat  6 servings of fruit/vegetables daily and drink plenty of water and avoid sweet beverages.

## 2017-09-16 LAB — LIPID PANEL
CHOL/HDL RATIO: 4 (calc) (ref <5.0)
Cholesterol: 178 mg/dL (ref <200)
HDL: 44 mg/dL (ref >40)
LDL CHOLESTEROL (CALC): 108 mg/dL — AB
NON-HDL CHOLESTEROL (CALC): 134 mg/dL — AB (ref <130)
Triglycerides: 151 mg/dL — ABNORMAL HIGH (ref <150)

## 2017-09-16 LAB — HEMOGLOBIN A1C
EAG (MMOL/L): 6.2 (calc)
HEMOGLOBIN A1C: 5.5 %{Hb} (ref <5.7)
MEAN PLASMA GLUCOSE: 111 (calc)

## 2017-09-16 LAB — PSA: PSA: 2.7 ng/mL (ref <=4.0)

## 2017-10-13 DIAGNOSIS — M1611 Unilateral primary osteoarthritis, right hip: Secondary | ICD-10-CM | POA: Diagnosis not present

## 2017-10-19 DIAGNOSIS — D631 Anemia in chronic kidney disease: Secondary | ICD-10-CM | POA: Diagnosis not present

## 2017-10-19 DIAGNOSIS — E875 Hyperkalemia: Secondary | ICD-10-CM | POA: Diagnosis not present

## 2017-10-19 DIAGNOSIS — N183 Chronic kidney disease, stage 3 (moderate): Secondary | ICD-10-CM | POA: Diagnosis not present

## 2017-10-19 DIAGNOSIS — I10 Essential (primary) hypertension: Secondary | ICD-10-CM | POA: Diagnosis not present

## 2017-10-20 DIAGNOSIS — M1612 Unilateral primary osteoarthritis, left hip: Secondary | ICD-10-CM | POA: Diagnosis not present

## 2017-11-03 DIAGNOSIS — M1612 Unilateral primary osteoarthritis, left hip: Secondary | ICD-10-CM | POA: Diagnosis not present

## 2017-11-10 DIAGNOSIS — M1612 Unilateral primary osteoarthritis, left hip: Secondary | ICD-10-CM | POA: Diagnosis not present

## 2017-11-20 ENCOUNTER — Other Ambulatory Visit: Payer: Self-pay | Admitting: Orthopedic Surgery

## 2017-11-24 ENCOUNTER — Encounter: Payer: Self-pay | Admitting: Family Medicine

## 2017-11-24 ENCOUNTER — Inpatient Hospital Stay: Payer: Medicare Other | Attending: Oncology

## 2017-11-24 DIAGNOSIS — Z862 Personal history of diseases of the blood and blood-forming organs and certain disorders involving the immune mechanism: Secondary | ICD-10-CM

## 2017-11-24 DIAGNOSIS — N189 Chronic kidney disease, unspecified: Secondary | ICD-10-CM | POA: Diagnosis not present

## 2017-11-24 DIAGNOSIS — D631 Anemia in chronic kidney disease: Secondary | ICD-10-CM | POA: Insufficient documentation

## 2017-11-24 LAB — CBC
HCT: 36.4 % — ABNORMAL LOW (ref 39.0–52.0)
HEMOGLOBIN: 11.5 g/dL — AB (ref 13.0–17.0)
MCH: 28 pg (ref 26.0–34.0)
MCHC: 31.6 g/dL (ref 30.0–36.0)
MCV: 88.8 fL (ref 80.0–100.0)
Platelets: 231 10*3/uL (ref 150–400)
RBC: 4.1 MIL/uL — ABNORMAL LOW (ref 4.22–5.81)
RDW: 13.2 % (ref 11.5–15.5)
WBC: 6.5 10*3/uL (ref 4.0–10.5)
nRBC: 0 % (ref 0.0–0.2)

## 2017-11-24 LAB — IRON AND TIBC
IRON: 32 ug/dL — AB (ref 45–182)
SATURATION RATIOS: 11 % — AB (ref 17.9–39.5)
TIBC: 299 ug/dL (ref 250–450)
UIBC: 267 ug/dL

## 2017-11-24 LAB — FERRITIN: FERRITIN: 394 ng/mL — AB (ref 24–336)

## 2017-11-25 ENCOUNTER — Telehealth: Payer: Self-pay | Admitting: Family Medicine

## 2017-11-25 NOTE — Telephone Encounter (Signed)
He needs to be cleared from hematologist for the Eliquis, I can see him for medical clearance and he will need a follow up

## 2017-11-25 NOTE — Telephone Encounter (Signed)
Copied from [place] [id number]. Topic: Quick Communication - See Telephone Encounter >> Nov 25, 2017 11:46 AM Rutherford Nail, NT wrote: CRM for notification. See Telephone encounter for: 11/25/17. Patient's wife calling to check the status of the paperwork that [facility] sent over. Please advise. Would like a call back regarding this. Unsure if an appointment is needed. Patient had surgery scheduled for 12/14/17. CB#: [phone]

## 2017-11-26 NOTE — Telephone Encounter (Signed)
appt scheduled for 11.25.19

## 2017-11-30 ENCOUNTER — Encounter: Payer: Self-pay | Admitting: Family Medicine

## 2017-11-30 ENCOUNTER — Ambulatory Visit (INDEPENDENT_AMBULATORY_CARE_PROVIDER_SITE_OTHER): Payer: Medicare Other | Admitting: Family Medicine

## 2017-11-30 ENCOUNTER — Encounter

## 2017-11-30 VITALS — BP 110/76 | HR 65 | Temp 98.2°F | Resp 16 | Ht 72.0 in | Wt 275.6 lb

## 2017-11-30 DIAGNOSIS — E785 Hyperlipidemia, unspecified: Secondary | ICD-10-CM

## 2017-11-30 DIAGNOSIS — N183 Chronic kidney disease, stage 3 unspecified: Secondary | ICD-10-CM

## 2017-11-30 DIAGNOSIS — I700 Atherosclerosis of aorta: Secondary | ICD-10-CM | POA: Diagnosis not present

## 2017-11-30 DIAGNOSIS — I10 Essential (primary) hypertension: Secondary | ICD-10-CM

## 2017-11-30 DIAGNOSIS — N2889 Other specified disorders of kidney and ureter: Secondary | ICD-10-CM

## 2017-11-30 DIAGNOSIS — D638 Anemia in other chronic diseases classified elsewhere: Secondary | ICD-10-CM

## 2017-11-30 DIAGNOSIS — I82562 Chronic embolism and thrombosis of left calf muscular vein: Secondary | ICD-10-CM

## 2017-11-30 DIAGNOSIS — M1612 Unilateral primary osteoarthritis, left hip: Secondary | ICD-10-CM

## 2017-11-30 DIAGNOSIS — I824Z2 Acute embolism and thrombosis of unspecified deep veins of left distal lower extremity: Secondary | ICD-10-CM

## 2017-11-30 DIAGNOSIS — J4540 Moderate persistent asthma, uncomplicated: Secondary | ICD-10-CM

## 2017-11-30 DIAGNOSIS — Z01818 Encounter for other preprocedural examination: Secondary | ICD-10-CM

## 2017-11-30 MED ORDER — APIXABAN 5 MG PO TABS: 5.0000 mg | ORAL_TABLET | Freq: Two times a day (BID) | ORAL | 5 refills | Status: DC

## 2017-11-30 MED ORDER — MONTELUKAST SODIUM 10 MG PO TABS: 10.0000 mg | ORAL_TABLET | Freq: Every day | ORAL | 5 refills | Status: DC

## 2017-11-30 NOTE — Progress Notes (Signed)
Name: [name]   MRN: [id number]    DOB: [birth year]   Date:11/30/2017       Progress Note  Subjective  Chief Complaint  Chief Complaint  Patient presents with  . Medical Clearance    for surgery    HPI  Pre-op Clearance: he has a history of DVT after knee surgery 12/2016, he is still on Eliquis has nose bleeds intermittently, anemia resolved, he will have left hip surgery on 12/14/2017. His surgeon Dr. Berenice Primas is requesting clearance. No previous history of heart disease, asthma has been controlled, not a smoker, no previous reactions to anesthesia. History of DVT and we will stop Eliquis 2 days prior to surgery and resume when safe after surgery as soon as possible He has daily left hip pain, using a cane to help with ambulation   HTN:bp has normalized since adrenal gland removed.Unchanged   Obese: he has lost almost 20 lbs since March 2018 he gained some back and now weight has been stable. He tries to eat healthy and is avoiding junk food   Asthma Moderate: he is using [employer] daily, Proairprn only at most once a week - currently triggered by cold  he states he has been doing well with no cough, wheezing or SOB at this time   History of anemia: but seen by hematologist and levels improved, current at 11.5 . No sob, pica, palpitation.   CKI stage III: and hyperkalemia, under the care of Dr. Holley Raring. No pruritus. Good urine output   Dyslipidemia: he is not on medication, low HDl reviewed labs with patient   Patient Active Problem List   Diagnosis Date Noted  . 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 Atherosclerosis of abdominal aorta ([place]) 09/15/2017  . 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 Primary osteoarthritis of left knee 12/19/2016  . Anemia, unspecified 04/30/2016  . History of benign neoplasm of adrenal gland 02/11/2016  . History of iron deficiency anemia 10/10/2015  . BPH  (benign prostatic hyperplasia) 06/20/2015  . ED (erectile dysfunction) 06/20/2015  . Allergic rhinitis, seasonal 06/20/2015  . Anemia of chronic disease 06/20/2015  . Hypogonadism in male 06/20/2015  . Asthma, well controlled, moderate persistent 06/20/2015  . Hypertension, benign 06/20/2015  . Hyperglycemia 06/20/2015  . History of shingles 06/20/2015  . GERD without esophagitis 06/20/2015  . Chronic radicular low back pain 06/20/2015  . History of epilepsy 06/20/2015  . Migraine without aura and without status migrainosus, not intractable 06/20/2015  . Dyslipidemia 06/20/2015  . Primary osteoarthritis of both knees 06/20/2015    Past Surgical History:  Procedure Laterality Date  . ADRENALECTOMY Left 02/11/2016   [facility]  . COLONOSCOPY  02/2012   normal  . JOINT REPLACEMENT    . KNEE ARTHROSCOPY Left 10/06/2009  . SINUS EXPLORATION    . TOTAL KNEE ARTHROPLASTY Right 06/20/2016  . TOTAL KNEE ARTHROPLASTY Right 06/20/2016   Procedure: TOTAL KNEE ARTHROPLASTY;  Surgeon: Dorna Leitz, MD;  Location: [facility];  Service: Orthopedics;  Laterality: Right;  . TOTAL KNEE ARTHROPLASTY Left 12/19/2016   Procedure: LEFT TOTAL KNEE ARTHROPLASTY;  Surgeon: Dorna Leitz, MD;  Location[facility];  Service: Orthopedics;  Laterality: Left;  Adductor Block    Family History  Problem Relation Age of Onset  . Diabetes Mother   . Heart disease Mother   . Lung disease Mother   . Seizures Maternal Grandmother     Social History   Socioeconomic History  .  Marital status: Married    Spouse name: Not on file  . Number of children: Not on file  . Years of education: Not on file  . Highest education level: Professional school degree (e.g., MD, DDS, DVM, JD)  Occupational History  . Occupation: [employer]  Social Needs  . Financial resource strain: Not hard at all  . Food insecurity:    Worry: Never true    Inability: Never true  . Transportation needs:    Medical: No    Non-medical: No   Tobacco Use  . Smoking status: Former Smoker    Packs/day: 1.00    Years: 10.00    Pack years: 10.00  . Smokeless tobacco: Never Used  . Tobacco comment: 38 years ago 55 when he stopped  Substance and Sexual Activity  . Alcohol use: No    Alcohol/week: 0.0 standard drinks  . Drug use: No  . Sexual activity: Yes    Partners: Female  Lifestyle  . Physical activity:    Days per week: 0 days    Minutes per session: 0 min  . Stress: Not at all  Relationships  . Social connections:    Talks on phone: Twice a week    Gets together: Three times a week    Attends religious service: More than 4 times per year    Active member of club or organization: Yes    Attends meetings of clubs or organizations: More than 4 times per year    Relationship status: Married  . Intimate partner violence:    Fear of current or ex partner: No    Emotionally abused: No    Physically abused: No    Forced sexual activity: No  Other Topics Concern  . Not on file  Social History Narrative  . Not on file     Current Outpatient Medications:  .  albuterol (PROAIR HFA) 108 (90 Base) MCG/ACT inhaler, Inhale 2 puffs into the lungs every 6 (six) hours as needed for wheezing or shortness of breath., Disp: 1 Inhaler, Rfl: 0 .  azelastine (OPTIVAR) 0.05 % ophthalmic solution, INSTILL 2 DROPS INTO BOTH EYES TWICE A DAY (Patient taking differently: Place 2 drops into both eyes 2 (two) times daily. ), Disp: 6 mL, Rfl: 5 .  BREO ELLIPTA 200-25 MCG/INH AEPB, Inhale 1 puff into the lungs daily., Disp: 60 each, Rfl: 5 .  cholecalciferol (VITAMIN D3) 25 MCG (1000 UT) tablet, Take 1,000 Units by mouth daily., Disp: , Rfl:  .  docusate sodium (COLACE) 100 MG capsule, Take 1 capsule (100 mg total) by mouth 2 (two) times daily. (Patient taking differently: Take 100 mg by mouth daily as needed for moderate constipation. ), Disp: 30 capsule, Rfl: 0 .  ELIQUIS 5 MG TABS tablet, TAKE 1 TABLET BY MOUTH TWICE A DAY (Patient taking  differently: Take 5 mg by mouth 2 (two) times daily. ), Disp: 60 tablet, Rfl: 5 .  fexofenadine (ALLEGRA) 180 MG tablet, Take 180 mg by mouth daily as needed for allergies or rhinitis., Disp: , Rfl:  .  fexofenadine-pseudoephedrine (ALLEGRA-D 24) 180-240 MG 24 hr tablet, Take 1 tablet by mouth daily as needed (for allergies.)., Disp: , Rfl:  .  fluticasone (FLONASE) 50 MCG/ACT nasal spray, Place 2 sprays into both nostrils daily. (Patient taking differently: Place 2 sprays into both nostrils daily as needed for allergies. ), Disp: 16 g, Rfl: 5 .  Glucosamine-MSM-Hyaluronic Acd (JOINT HEALTH PO), Take 1 tablet by mouth daily. INSTAFLEX ADVANCED  JOINT SUPPORT, Disp: , Rfl:  .  Lactobacillus Rhamnosus, GG, (CULTURELLE) CAPS, Take 1 capsule by mouth daily. , Disp: , Rfl:  .  Misc Natural Products (GINSENG COMPLEX PO), Take 1 tablet by mouth daily., Disp: , Rfl:  .  Misc Natural Products (NF FORMULAS TESTOSTERONE PO), Take 2 tablets by mouth daily. NATURE'S PLUS T-MALE SUPPLEMENT, Disp: , Rfl:  .  montelukast (SINGULAIR) 10 MG tablet, Take 1 tablet (10 mg total) by mouth daily., Disp: 30 tablet, Rfl: 5 .  Multiple Vitamins-Minerals (MULTIVITAMIN ADULTS 50+ PO), Take 1 tablet by mouth daily. NATURE'S CODE MEN OVER 50 MULTIVITAMIN PACK , Disp: , Rfl:  .  OVER THE COUNTER MEDICATION, Take 1 tablet by mouth 3 (three) times daily. TEST-HD TESTOSTERONE SUPPORT , Disp: , Rfl:  .  patiromer (VELTASSA) 8.4 g packet, Take 8.4 g by mouth daily. , Disp: , Rfl:  .  Polyethyl Glycol-Propyl Glycol (SYSTANE ULTRA) 0.4-0.3 % SOLN, Place 1-2 drops into both eyes 3 (three) times daily as needed (for dry/irritated eyes.)., Disp: , Rfl:  .  tadalafil (CIALIS) 5 MG tablet, Take 1 tablet (5 mg total) by mouth daily as needed for erectile dysfunction. (Patient taking differently: Take 5 mg by mouth daily. ), Disp: 30 tablet, Rfl: 5 .  tamsulosin (FLOMAX) 0.4 MG CAPS capsule, Take 1 capsule (0.4 mg total) by mouth every evening.,  Disp: 30 capsule, Rfl: 5 .  tiZANidine (ZANAFLEX) 2 MG tablet, Take 1 tablet (2 mg total) by mouth 3 (three) times daily., Disp: 90 tablet, Rfl: 5 .  traMADol (ULTRAM) 50 MG tablet, Take 50 mg by mouth every 6 (six) hours as needed for moderate pain. , Disp: , Rfl:  .  vitamin B-12 (CYANOCOBALAMIN) 500 MCG tablet, Take 500 mcg by mouth daily., Disp: , Rfl:   Allergies  Allergen Reactions  . Lactose Intolerance (Gi) Other (See Comments)    MIGRAINES  . Shellfish Allergy Other (See Comments)    Congestion/breathing problems/migraines.    I personally reviewed active problem list, medication list, allergies, family history, social history with the patient/caregiver today.   ROS  Constitutional: Negative for fever or weight change.  Respiratory: Negative for cough and shortness of breath.   Cardiovascular: Negative for chest pain or palpitations.  Gastrointestinal: Negative for abdominal pain, no bowel changes.  Musculoskeletal: Negative for gait problem or joint swelling.  Skin: Negative for rash.  Neurological: Negative for dizziness or headache.  No other specific complaints in a complete review of systems (except as listed in HPI above).  Objective  Vitals:   11/30/17 0948  BP: 110/76  Pulse: 65  Resp: 16  Temp: 98.2 F (36.8 C)  TempSrc: Oral  SpO2: 97%  Weight: 275 lb 9.6 oz (125 kg)  Height: 6' (1.829 m)    Body mass index is 37.38 kg/m.  Physical Exam  Constitutional: Patient appears well-developed and well-nourished. Obese  No distress.  HEENT: head atraumatic, normocephalic, pupils equal and reactive to light, neck supple, throat within normal limits Cardiovascular: Normal rate, regular rhythm and normal heart sounds.  No murmur heard. No BLE edema. Pulmonary/Chest: Effort normal and breath sounds normal. No respiratory distress. Abdominal: Soft.  There is no tenderness. Muscular Skeletal: pain with rom of left hip, using a cane Psychiatric: Patient has a  normal mood and affect. behavior is normal. Judgment and thought content normal.  Recent Results (from the past 2160 hour(s))  PSA     Status: None   Collection Time: 09/15/17  4:11 PM  Result Value Ref Range   PSA 2.7 < OR = 4.0 ng/mL    Comment: The total PSA value from this assay system is  standardized against the WHO standard. The test  result will be approximately 20% lower when compared  to the equimolar-standardized total PSA (Beckman  Coulter). Comparison of serial PSA results should be  interpreted with this fact in mind. . This test was performed using the Siemens  chemiluminescent method. Values obtained from  different assay methods cannot be used interchangeably. PSA levels, regardless of value, should not be interpreted as absolute evidence of the presence or absence of disease.   Lipid panel     Status: Abnormal   Collection Time: 09/15/17  4:11 PM  Result Value Ref Range   Cholesterol 178 <200 mg/dL   HDL 44 >40 mg/dL   Triglycerides 151 (H) <150 mg/dL   LDL Cholesterol (Calc) 108 (H) mg/dL (calc)    Comment: Reference range: <100 . Desirable range <100 mg/dL for primary prevention;   <70 mg/dL for patients with CHD or diabetic patients  with > or = 2 CHD risk factors. Marland Kitchen LDL-C is now calculated using the Martin-Hopkins  calculation, which is a validated novel method providing  better accuracy than the Friedewald equation in the  estimation of LDL-C.  Cresenciano Genre et al. Annamaria Helling. 3662;947(65): 2061-2068  (http://education.[employer].com/faq/FAQ164)    Total CHOL/HDL Ratio 4.0 <5.0 (calc)   Non-HDL Cholesterol (Calc) 134 (H) <130 mg/dL (calc)    Comment: For patients with diabetes plus 1 major ASCVD risk  factor, treating to a non-HDL-C goal of <100 mg/dL  (LDL-C of <70 mg/dL) is considered a therapeutic  option.   Hemoglobin A1c     Status: None   Collection Time: 09/15/17  4:11 PM  Result Value Ref Range   Hgb A1c MFr Bld 5.5 <5.7 % of total Hgb     Comment: For the purpose of screening for the presence of diabetes: . <5.7%       Consistent with the absence of diabetes 5.7-6.4%    Consistent with increased risk for diabetes             (prediabetes) > or =6.5%  Consistent with diabetes . This assay result is consistent with a decreased risk of diabetes. . Currently, no consensus exists regarding use of hemoglobin A1c for diagnosis of diabetes in children. . According to American Diabetes Association (ADA) guidelines, hemoglobin A1c <7.0% represents optimal control in non-pregnant diabetic patients. Different metrics may apply to specific patient populations.  Standards of Medical Care in Diabetes(ADA). .    Mean Plasma Glucose 111 (calc)   eAG (mmol/L) 6.2 (calc)  Iron and TIBC     Status: Abnormal   Collection Time: 11/24/17  4:11 PM  Result Value Ref Range   Iron 32 (L) 45 - 182 ug/dL   TIBC 299 250 - 450 ug/dL   Saturation Ratios 11 (L) 17.9 - 39.5 %   UIBC 267 ug/dL    Comment: Performed at [facility], [address] [place], [place] [address]  Ferritin     Status: Abnormal   Collection Time: 11/24/17  4:11 PM  Result Value Ref Range   Ferritin 394 (H) 24 - 336 ng/mL    Comment: Performed at [facility], [address] [place], [place] [address]  CBC     Status: Abnormal   Collection Time: 11/24/17  4:11 PM  Result Value Ref Range   WBC  6.5 4.0 - 10.5 K/uL   RBC 4.10 (L) 4.22 - 5.81 MIL/uL   Hemoglobin 11.5 (L) 13.0 - 17.0 g/dL   HCT 36.4 (L) 39.0 - 52.0 %   MCV 88.8 80.0 - 100.0 fL   MCH 28.0 26.0 - 34.0 pg   MCHC 31.6 30.0 - 36.0 g/dL   RDW 13.2 11.5 - 15.5 %   Platelets 231 150 - 400 K/uL   nRBC 0.0 0.0 - 0.2 %    Comment: Performed at [facility], [address] [place], [place] [address]     PHQ2/9: Depression screen [facility] 2/9 09/15/2017 09/15/2017 06/02/2017 11/26/2016 10/10/2015  Decreased Interest 0 0 0 0 0  Down, Depressed, Hopeless 0 0 0 0 0  PHQ - 2 Score 0 0 0 0  0  Altered sleeping 0 - - - -  Tired, decreased energy 1 - - - -  Change in appetite 0 - - - -  Feeling bad or failure about yourself  0 - - - -  Trouble concentrating 0 - - - -  Moving slowly or fidgety/restless 0 - - - -  Suicidal thoughts 0 - - - -  PHQ-9 Score 1 - - - -  Difficult doing work/chores Not difficult at all - - - -     Fall Risk: Fall Risk  11/30/2017 09/15/2017 06/02/2017 01/19/2017 11/26/2016  Falls in the past year? 0 Yes No No No  Number falls in past yr: - 2 or more - - -  Injury with Fall? - No - - -    Assessment & Plan  1. Primary osteoarthritis of left hip  May go to surgery : based on MACE : low risk for major adverse effects, history of DVT recommend to resume Eliquis after surgery is possible   2. Asthma, well controlled, moderate persistent  - montelukast (SINGULAIR) 10 MG tablet; Take 1 tablet (10 mg total) by mouth daily.  Dispense: 30 tablet; Refill: 5  3. Atherosclerosis of abdominal aorta ([place])   4. Anemia of chronic disease   5. Chronic kidney insufficiency, stage 3 (moderate) (HCC)  Stable   6. Dyslipidemia   7. Essential hypertension  Resolved   8. Chronic deep vein thrombosis (DVT) of calf muscle vein of left lower extremity  Continue Eliquis   - apixaban (ELIQUIS) 5 MG TABS tablet; Take 1 tablet (5 mg total) by mouth 2 (two) times daily.  Dispense: 60 tablet; Refill: 5  9. Pre-op evaluation  Proceed to surgery

## 2017-12-01 NOTE — Pre-Procedure Instructions (Addendum)
[name]  12/01/2017      [id number] [address], [place] [address] Phone: [phone] Fax: [phone]    Your procedure is scheduled on Dec. 9  Report to [facility] Admitting at 10:25 A.M.  Call this number if you have problems the morning of surgery:  [phone]   Remember:  Do not eat or drink after midnight.      Take these medicines the morning of surgery with A SIP OF WATER:              Albuterol inhaler if needed--bring to hospital             Eye drops              breo inhaler--bring to hospital             Tizanidine ( zanaflex)              Tramadol if needed               flonase spray               allegra              7 days prior to surgery STOP taking any Aspirin(unless otherwise instructed by your surgeon), Aleve, Naproxen, Ibuprofen, Motrin, Advil, Goody's, BC's, all herbal medications, fish oil, and all vitamins            Follow your surgeon's instructions on when to stop Eliquis.  If no instructions were given by your surgeon then you will need to call the office to get those instructions.      Do not wear jewelry.  Do not wear lotions, powders, or perfumes, or deodorant.  Do not shave 48 hours prior to surgery.  Men may shave face and neck.  Do not bring valuables to the hospital.  [facility] is not responsible for any belongings or valuables.  Contacts, dentures or bridgework may not be worn into surgery.  Leave your suitcase in the car.  After surgery it may be brought to your room.  For patients admitted to the hospital, discharge time will be determined by your treatment team.  Patients discharged the day of surgery will not be allowed to drive home.    Special instructions:  - Preparing For Surgery  Before surgery, you can play an important role. Because skin is not sterile, your skin needs to be as free of germs as possible. You can reduce the  number of germs on your skin by washing with CHG (chlorahexidine gluconate) Soap before surgery.  CHG is an antiseptic cleaner which kills germs and bonds with the skin to continue killing germs even after washing.    Oral Hygiene is also important to reduce your risk of infection.  Remember - BRUSH YOUR TEETH THE MORNING OF SURGERY WITH YOUR REGULAR TOOTHPASTE  Please do not use if you have an allergy to CHG or antibacterial soaps. If your skin becomes reddened/irritated stop using the CHG.  Do not shave (including legs and underarms) for at least 48 hours prior to first CHG shower. It is OK to shave your face.  Please follow these instructions carefully.   1. Shower the NIGHT BEFORE SURGERY and the MORNING OF SURGERY with CHG.   2. If you chose to wash your hair, wash your hair first as usual with your normal shampoo.  3. After you  shampoo, rinse your hair and body thoroughly to remove the shampoo.  4. Use CHG as you would any other liquid soap. You can apply CHG directly to the skin and wash gently with a scrungie or a clean washcloth.   5. Apply the CHG Soap to your body ONLY FROM THE NECK DOWN.  Do not use on open wounds or open sores. Avoid contact with your eyes, ears, mouth and genitals (private parts). Wash Face and genitals (private parts)  with your normal soap.  6. Wash thoroughly, paying special attention to the area where your surgery will be performed.  7. Thoroughly rinse your body with warm water from the neck down.  8. DO NOT shower/wash with your normal soap after using and rinsing off the CHG Soap.  9. Pat yourself dry with a CLEAN TOWEL.  10. Wear CLEAN PAJAMAS to bed the night before surgery, wear comfortable clothes the morning of surgery  11. Place CLEAN SHEETS on your bed the night of your first shower and DO NOT SLEEP WITH PETS.    Day of Surgery:  Do not apply any deodorants/lotions.  Please wear clean clothes to the hospital/surgery center.   Remember  to brush your teeth WITH YOUR REGULAR TOOTHPASTE.    Please read over the following fact sheets that you were given. Coughing and Deep Breathing, MRSA Information and Surgical Site Infection Prevention

## 2017-12-02 ENCOUNTER — Encounter (HOSPITAL_COMMUNITY): Payer: Self-pay

## 2017-12-02 ENCOUNTER — Other Ambulatory Visit (HOSPITAL_COMMUNITY): Payer: Medicare Other

## 2017-12-02 ENCOUNTER — Encounter (HOSPITAL_COMMUNITY)
Admission: RE | Admit: 2017-12-02 | Discharge: 2017-12-02 | Disposition: A | Discharge status: Home / Self Care | Payer: Medicare Other | Source: Ambulatory Visit | Attending: Orthopedic Surgery | Admitting: Orthopedic Surgery

## 2017-12-02 ENCOUNTER — Ambulatory Visit (HOSPITAL_COMMUNITY)
Admission: RE | Admit: 2017-12-02 | Discharge: 2017-12-02 | Disposition: A | Discharge status: Home / Self Care | Payer: Medicare Other | Source: Ambulatory Visit | Attending: Orthopedic Surgery | Admitting: Orthopedic Surgery

## 2017-12-02 DIAGNOSIS — Z01818 Encounter for other preprocedural examination: Secondary | ICD-10-CM | POA: Diagnosis not present

## 2017-12-02 HISTORY — DX: Pneumonia, unspecified organism: J18.9

## 2017-12-02 HISTORY — DX: Acute embolism and thrombosis of unspecified deep veins of unspecified lower extremity: I82.409

## 2017-12-02 HISTORY — DX: Chronic kidney disease, unspecified: N18.9

## 2017-12-02 LAB — CBC WITH DIFFERENTIAL/PLATELET
Abs Immature Granulocytes: 0.02 10*3/uL (ref 0.00–0.07)
BASOS ABS: 0.1 10*3/uL (ref 0.0–0.1)
Basophils Relative: 1 %
EOS ABS: 0.1 10*3/uL (ref 0.0–0.5)
EOS PCT: 2 %
HEMATOCRIT: 39.8 % (ref 39.0–52.0)
Hemoglobin: 12.1 g/dL — ABNORMAL LOW (ref 13.0–17.0)
IMMATURE GRANULOCYTES: 0 %
Lymphocytes Relative: 31 %
Lymphs Abs: 1.8 10*3/uL (ref 0.7–4.0)
MCH: 27.4 pg (ref 26.0–34.0)
MCHC: 30.4 g/dL (ref 30.0–36.0)
MCV: 90.2 fL (ref 80.0–100.0)
Monocytes Absolute: 0.5 10*3/uL (ref 0.1–1.0)
Monocytes Relative: 9 %
NEUTROS PCT: 57 %
NRBC: 0 % (ref 0.0–0.2)
Neutro Abs: 3.3 10*3/uL (ref 1.7–7.7)
PLATELETS: 234 10*3/uL (ref 150–400)
RBC: 4.41 MIL/uL (ref 4.22–5.81)
RDW: 13.2 % (ref 11.5–15.5)
WBC: 5.8 10*3/uL (ref 4.0–10.5)

## 2017-12-02 LAB — COMPREHENSIVE METABOLIC PANEL
ALBUMIN: 4 g/dL (ref 3.5–5.0)
ALT: 23 U/L (ref 0–44)
ANION GAP: 7 (ref 5–15)
AST: 27 U/L (ref 15–41)
Alkaline Phosphatase: 82 U/L (ref 38–126)
BILIRUBIN TOTAL: 0.6 mg/dL (ref 0.3–1.2)
BUN: 29 mg/dL — AB (ref 8–23)
CO2: 27 mmol/L (ref 22–32)
Calcium: 9.8 mg/dL (ref 8.9–10.3)
Chloride: 101 mmol/L (ref 98–111)
Creatinine, Ser: 1.26 mg/dL — ABNORMAL HIGH (ref 0.61–1.24)
GFR calc Af Amer: >60 mL/min (ref >60)
GFR, EST NON AFRICAN AMERICAN: 59 mL/min — AB (ref >60)
GLUCOSE: 98 mg/dL (ref 70–99)
POTASSIUM: 4.1 mmol/L (ref 3.5–5.1)
Sodium: 135 mmol/L (ref 135–145)
TOTAL PROTEIN: 7.7 g/dL (ref 6.5–8.1)

## 2017-12-02 LAB — URINALYSIS, ROUTINE W REFLEX MICROSCOPIC
BILIRUBIN URINE: NEGATIVE
Glucose, UA: NEGATIVE mg/dL
Hgb urine dipstick: NEGATIVE
KETONES UR: NEGATIVE mg/dL
Leukocytes, UA: NEGATIVE
Nitrite: NEGATIVE
PH: 7 (ref 5.0–8.0)
Protein, ur: NEGATIVE mg/dL
SPECIFIC GRAVITY, URINE: 1.018 (ref 1.005–1.030)

## 2017-12-02 LAB — SURGICAL PCR SCREEN
MRSA, PCR: NEGATIVE
STAPHYLOCOCCUS AUREUS: NEGATIVE

## 2017-12-02 LAB — TYPE AND SCREEN
ABO/RH(D): O POS
ANTIBODY SCREEN: NEGATIVE

## 2017-12-02 LAB — APTT: APTT: 30 s (ref 24–36)

## 2017-12-02 LAB — PROTIME-INR
INR: 1.04
PROTHROMBIN TIME: 13.5 s (ref 11.4–15.2)

## 2017-12-02 NOTE — Progress Notes (Signed)
PCP: Dr. Ancil Boozer  No cardiologist Oncologist: Dr. Janese Banks Nephrologist: Dr.  Holley Raring in [facility]  Pt. Instructed to stop eliquis 2 days prior to surgery per Dr. Berenice Primas

## 2017-12-04 NOTE — Progress Notes (Addendum)
Anesthesia Chart Review:   Case:  [id number] Date/Time:  12/14/17 1210   Procedure:  TOTAL HIP ARTHROPLASTY ANTERIOR APPROACH (Left )   Anesthesia type:  Spinal   Pre-op diagnosis:  OSTEOARTHRITIS LEFT HIP   Location:  [place] OR ROOM 06 / [facility] [facility]   Surgeon:  Dorna Leitz, MD      DISCUSSION:  - Pt is a 67 year old male with hx post-op DVT after TKA 12/2016 (on eliquis), HTN, CKD, anemia of chronic disease  - S/p L TKA 12/19/16.  S/p R TKA 06/20/16  - Pt instructed to stop eliquis 2 days prior to surgery by PCP. See addendum below.  - Has medical clearance for surgery    VS: BP 140/81   Pulse 89   Temp 36.8 C   Resp 18   Ht 6\' 2"  (1.88 m)   Wt 125.6 kg   SpO2 96%   BMI 35.55 kg/m    PROVIDERS: - PCP is Steele Sizer, MD who cleared pt for surgery at last office visit 11/30/17.  - Hematologist is Randa Evens, MD - Nephrologist is Anthonette Legato, MD    LABS: Labs reviewed: Acceptable for surgery. (all labs ordered are listed, but only abnormal results are displayed)  Labs Reviewed  CBC WITH DIFFERENTIAL/PLATELET - Abnormal; Notable for the following components:      Result Value   Hemoglobin 12.1 (*)    All other components within normal limits  COMPREHENSIVE METABOLIC PANEL - Abnormal; Notable for the following components:   BUN 29 (*)    Creatinine, Ser 1.26 (*)    GFR calc non Af Amer 59 (*)    All other components within normal limits  SURGICAL PCR SCREEN  APTT  PROTIME-INR  URINALYSIS, ROUTINE W REFLEX MICROSCOPIC  TYPE AND SCREEN     IMAGES:  CXR 12/02/17: No acute findings   EKG 12/02/17: NSR with sinus arrhythmia. Minimal voltage criteria for LVH, may be normal variant   CV:  Past Medical History:  Diagnosis Date  . Allergic rhinitis   . Anemia   . BPH (benign prostatic hyperplasia)   . Chronic kidney disease   . Chronic sinusitis   . Complication of anesthesia    work up during surgery 2x in the past   . Decreased libido   . DVT (deep  venous thrombosis) (HCC)    left leg after left knee surgery  . ED (erectile dysfunction)   . Fatigue   . Hematuria   . HTN (hypertension)    history of  . Hypogonadism in male   . Hypokalemia    history of   . IBS (irritable bowel syndrome)   . Low serum vitamin D   . Lumbago   . Migraine   . Mild intermittent asthma   . Osteoarthritis of hip    left (bone-on-bone) Dr. Alta Corning & Alvina Filbert. Modena Slater, PA-C  . Pneumonia    walking pneumonia  . Reflux   . Shingles   . Unilateral inguinal hernia without obstruction or gangrene     Past Surgical History:  Procedure Laterality Date  . ADRENALECTOMY Left 02/11/2016   [facility]  . COLONOSCOPY  02/2012   normal  . JOINT REPLACEMENT    . KNEE ARTHROSCOPY Left 10/06/2009  . SINUS EXPLORATION    . TOTAL KNEE ARTHROPLASTY Right 06/20/2016  . TOTAL KNEE ARTHROPLASTY Right 06/20/2016   Procedure: TOTAL KNEE ARTHROPLASTY;  Surgeon: Dorna Leitz, MD;  Location: [facility];  Service: Orthopedics;  Laterality: Right;  . TOTAL KNEE ARTHROPLASTY Left 12/19/2016   Procedure: LEFT TOTAL KNEE ARTHROPLASTY;  Surgeon: Dorna Leitz, MD;  Location: [facility];  Service: Orthopedics;  Laterality: Left;  Adductor Block    MEDICATIONS: . albuterol (PROAIR HFA) 108 (90 Base) MCG/ACT inhaler  . apixaban (ELIQUIS) 5 MG TABS tablet  . azelastine (OPTIVAR) 0.05 % ophthalmic solution  . BREO ELLIPTA 200-25 MCG/INH AEPB  . cholecalciferol (VITAMIN D3) 25 MCG (1000 UT) tablet  . docusate sodium (COLACE) 100 MG capsule  . fexofenadine (ALLEGRA) 180 MG tablet  . fexofenadine-pseudoephedrine (ALLEGRA-D 24) 180-240 MG 24 hr tablet  . fluticasone (FLONASE) 50 MCG/ACT nasal spray  . Glucosamine-MSM-Hyaluronic Acd (JOINT HEALTH PO)  . Lactobacillus Rhamnosus, GG, (CULTURELLE) CAPS  . Misc Natural Products (GINSENG COMPLEX PO)  . Misc Natural Products (NF FORMULAS TESTOSTERONE PO)  . montelukast (SINGULAIR) 10 MG tablet  . Multiple Vitamins-Minerals (MULTIVITAMIN ADULTS  50+ PO)  . OVER THE COUNTER MEDICATION  . patiromer (VELTASSA) 8.4 g packet  . Polyethyl Glycol-Propyl Glycol (SYSTANE ULTRA) 0.4-0.3 % SOLN  . tadalafil (CIALIS) 5 MG tablet  . tamsulosin (FLOMAX) 0.4 MG CAPS capsule  . tiZANidine (ZANAFLEX) 2 MG tablet  . traMADol (ULTRAM) 50 MG tablet  . vitamin B-12 (CYANOCOBALAMIN) 500 MCG tablet   No current facility-administered medications for this encounter.     - PCP's office is closed.  Will leave chart for f/u on getting permission to hold eliquis 3 days prior to surgery.    ADDENDUM 12/09/2017: Pt given okay from PCP to stop eliquis 3 days prior to surgery to allow for spinal anesthesia  Willeen Cass, FNP-BC [facility]/Anesthesiology Phone: [phone] 12/04/2017 2:04 PM

## 2017-12-07 ENCOUNTER — Ambulatory Visit: Payer: Medicare Other | Admitting: Family Medicine

## 2017-12-08 NOTE — Telephone Encounter (Signed)
Colletta Maryland calling from [facility] called and stated that because of the spinal anesthesia he would need to stop Eliquis 72 hours prior to appointment. She would like to know if this will be okay. Please advise Cb#[phone]

## 2017-12-09 NOTE — Telephone Encounter (Signed)
Per Dr. Steele Sizer, I contacted Colletta Maryland so that I can inform her that yes he can stop the Eliquis 72 hours prior to his appointment.  She said ok, great and thanks.

## 2017-12-09 NOTE — Telephone Encounter (Signed)
Yes to stop 72 hour prior

## 2017-12-10 ENCOUNTER — Other Ambulatory Visit: Payer: Self-pay | Admitting: Orthopedic Surgery

## 2017-12-10 NOTE — Care Plan (Signed)
Patient known to me from prior surgeries. Plan is to discharge to home and go to [address] for [facility]. Will follow for needs.   Ladell Heads, [place]

## 2017-12-11 MED ORDER — DEXTROSE 5 % IV SOLN
3.0000 g | Freq: Once | INTRAVENOUS | Status: AC
  Administered 2017-12-14: 3 g via INTRAVENOUS
  Filled 2017-12-11: qty 3

## 2017-12-11 MED ORDER — TRANEXAMIC ACID-NACL 1000-0.7 MG/100ML-% IV SOLN
1000.0000 mg | INTRAVENOUS | Status: AC
  Administered 2017-12-14: 1000 mg via INTRAVENOUS
  Filled 2017-12-11: qty 100

## 2017-12-11 MED ORDER — BUPIVACAINE LIPOSOME 1.3 % IJ SUSP
20.0000 mL | Freq: Once | INTRAMUSCULAR | Status: DC
  Filled 2017-12-11: qty 20

## 2017-12-14 ENCOUNTER — Inpatient Hospital Stay (HOSPITAL_COMMUNITY): Payer: Medicare Other | Admitting: Certified Registered Nurse Anesthetist

## 2017-12-14 ENCOUNTER — Other Ambulatory Visit: Payer: Self-pay

## 2017-12-14 ENCOUNTER — Encounter (HOSPITAL_COMMUNITY)
Admission: RE | Disposition: A | Discharge status: Home / Self Care | Payer: Self-pay | Source: Home / Self Care | Attending: Orthopedic Surgery

## 2017-12-14 ENCOUNTER — Inpatient Hospital Stay (HOSPITAL_COMMUNITY): Payer: Medicare Other | Admitting: Emergency Medicine

## 2017-12-14 ENCOUNTER — Inpatient Hospital Stay (HOSPITAL_COMMUNITY): Payer: Medicare Other

## 2017-12-14 ENCOUNTER — Inpatient Hospital Stay (HOSPITAL_COMMUNITY)
Admission: RE | Admit: 2017-12-14 | Discharge: 2017-12-18 | DRG: 470 | Disposition: A | Discharge status: Home / Self Care | Payer: Medicare Other | Attending: Orthopedic Surgery | Admitting: Orthopedic Surgery

## 2017-12-14 ENCOUNTER — Encounter (HOSPITAL_COMMUNITY): Payer: Self-pay

## 2017-12-14 DIAGNOSIS — N529 Male erectile dysfunction, unspecified: Secondary | ICD-10-CM | POA: Diagnosis present

## 2017-12-14 DIAGNOSIS — J302 Other seasonal allergic rhinitis: Secondary | ICD-10-CM | POA: Diagnosis present

## 2017-12-14 DIAGNOSIS — Z7982 Long term (current) use of aspirin: Secondary | ICD-10-CM | POA: Diagnosis not present

## 2017-12-14 DIAGNOSIS — M1612 Unilateral primary osteoarthritis, left hip: Principal | ICD-10-CM | POA: Diagnosis present

## 2017-12-14 DIAGNOSIS — N183 Chronic kidney disease, stage 3 (moderate): Secondary | ICD-10-CM | POA: Diagnosis present

## 2017-12-14 DIAGNOSIS — I129 Hypertensive chronic kidney disease with stage 1 through stage 4 chronic kidney disease, or unspecified chronic kidney disease: Secondary | ICD-10-CM | POA: Diagnosis present

## 2017-12-14 DIAGNOSIS — Z96642 Presence of left artificial hip joint: Secondary | ICD-10-CM | POA: Diagnosis not present

## 2017-12-14 DIAGNOSIS — Z87891 Personal history of nicotine dependence: Secondary | ICD-10-CM

## 2017-12-14 DIAGNOSIS — Z96653 Presence of artificial knee joint, bilateral: Secondary | ICD-10-CM | POA: Diagnosis present

## 2017-12-14 DIAGNOSIS — Z6835 Body mass index (BMI) 35.0-35.9, adult: Secondary | ICD-10-CM

## 2017-12-14 DIAGNOSIS — M5416 Radiculopathy, lumbar region: Secondary | ICD-10-CM | POA: Diagnosis present

## 2017-12-14 DIAGNOSIS — K219 Gastro-esophageal reflux disease without esophagitis: Secondary | ICD-10-CM | POA: Diagnosis present

## 2017-12-14 DIAGNOSIS — E669 Obesity, unspecified: Secondary | ICD-10-CM | POA: Diagnosis present

## 2017-12-14 DIAGNOSIS — N400 Enlarged prostate without lower urinary tract symptoms: Secondary | ICD-10-CM | POA: Diagnosis present

## 2017-12-14 DIAGNOSIS — Z91013 Allergy to seafood: Secondary | ICD-10-CM

## 2017-12-14 DIAGNOSIS — Z7951 Long term (current) use of inhaled steroids: Secondary | ICD-10-CM

## 2017-12-14 DIAGNOSIS — D62 Acute posthemorrhagic anemia: Secondary | ICD-10-CM | POA: Diagnosis not present

## 2017-12-14 DIAGNOSIS — I10 Essential (primary) hypertension: Secondary | ICD-10-CM | POA: Diagnosis not present

## 2017-12-14 DIAGNOSIS — Z7901 Long term (current) use of anticoagulants: Secondary | ICD-10-CM | POA: Diagnosis not present

## 2017-12-14 DIAGNOSIS — E739 Lactose intolerance, unspecified: Secondary | ICD-10-CM | POA: Diagnosis present

## 2017-12-14 DIAGNOSIS — M25552 Pain in left hip: Secondary | ICD-10-CM | POA: Diagnosis not present

## 2017-12-14 DIAGNOSIS — Z419 Encounter for procedure for purposes other than remedying health state, unspecified: Secondary | ICD-10-CM

## 2017-12-14 DIAGNOSIS — I82592 Chronic embolism and thrombosis of other specified deep vein of left lower extremity: Secondary | ICD-10-CM | POA: Diagnosis present

## 2017-12-14 DIAGNOSIS — E785 Hyperlipidemia, unspecified: Secondary | ICD-10-CM | POA: Diagnosis present

## 2017-12-14 DIAGNOSIS — D638 Anemia in other chronic diseases classified elsewhere: Secondary | ICD-10-CM | POA: Diagnosis not present

## 2017-12-14 DIAGNOSIS — Z79899 Other long term (current) drug therapy: Secondary | ICD-10-CM | POA: Diagnosis not present

## 2017-12-14 DIAGNOSIS — J4520 Mild intermittent asthma, uncomplicated: Secondary | ICD-10-CM | POA: Diagnosis present

## 2017-12-14 DIAGNOSIS — Z471 Aftercare following joint replacement surgery: Secondary | ICD-10-CM | POA: Diagnosis not present

## 2017-12-14 HISTORY — PX: TOTAL HIP ARTHROPLASTY: SHX124

## 2017-12-14 LAB — PROTIME-INR
INR: 0.99
Prothrombin Time: 13 seconds (ref 11.4–15.2)

## 2017-12-14 LAB — GLUCOSE, CAPILLARY: GLUCOSE-CAPILLARY: 134 mg/dL — AB (ref 70–99)

## 2017-12-14 SURGERY — ARTHROPLASTY, HIP, TOTAL, ANTERIOR APPROACH
Anesthesia: Spinal | Site: Hip | Laterality: Left

## 2017-12-14 MED ORDER — PROPOFOL 1000 MG/100ML IV EMUL
INTRAVENOUS | Status: AC
  Filled 2017-12-14: qty 100

## 2017-12-14 MED ORDER — METHOCARBAMOL 500 MG PO TABS
ORAL_TABLET | ORAL | Status: AC
  Filled 2017-12-14: qty 1

## 2017-12-14 MED ORDER — FENTANYL CITRATE (PF) 100 MCG/2ML IJ SOLN
INTRAMUSCULAR | Status: AC
  Filled 2017-12-14: qty 2

## 2017-12-14 MED ORDER — PROPOFOL 500 MG/50ML IV EMUL
INTRAVENOUS | Status: DC | PRN
  Administered 2017-12-14: 75 ug/kg/min via INTRAVENOUS

## 2017-12-14 MED ORDER — PROPOFOL 10 MG/ML IV BOLUS
INTRAVENOUS | Status: AC
  Filled 2017-12-14: qty 20

## 2017-12-14 MED ORDER — FENTANYL CITRATE (PF) 250 MCG/5ML IJ SOLN
INTRAMUSCULAR | Status: AC
  Filled 2017-12-14: qty 5

## 2017-12-14 MED ORDER — ACETAMINOPHEN 325 MG PO TABS
325.0000 mg | ORAL_TABLET | Freq: Four times a day (QID) | ORAL | Status: DC | PRN
  Administered 2017-12-17: 650 mg via ORAL
  Filled 2017-12-14: qty 2

## 2017-12-14 MED ORDER — FENTANYL CITRATE (PF) 100 MCG/2ML IJ SOLN
INTRAMUSCULAR | Status: DC | PRN
  Administered 2017-12-14: 25 ug via INTRAVENOUS
  Administered 2017-12-14 (×3): 50 ug via INTRAVENOUS
  Administered 2017-12-14 (×2): 25 ug via INTRAVENOUS
  Administered 2017-12-14 (×3): 50 ug via INTRAVENOUS
  Administered 2017-12-14: 25 ug via INTRAVENOUS
  Administered 2017-12-14 (×3): 50 ug via INTRAVENOUS

## 2017-12-14 MED ORDER — HYDROMORPHONE HCL 1 MG/ML IJ SOLN
0.5000 mg | INTRAMUSCULAR | Status: DC | PRN
  Administered 2017-12-14 – 2017-12-17 (×4): 1 mg via INTRAVENOUS
  Filled 2017-12-14 (×4): qty 1

## 2017-12-14 MED ORDER — ONDANSETRON HCL 4 MG/2ML IJ SOLN
INTRAMUSCULAR | Status: AC
  Filled 2017-12-14: qty 2

## 2017-12-14 MED ORDER — FLUTICASONE FUROATE-VILANTEROL 200-25 MCG/INH IN AEPB
1.0000 | INHALATION_SPRAY | Freq: Every day | RESPIRATORY_TRACT | Status: DC
  Administered 2017-12-15 – 2017-12-18 (×4): 1 via RESPIRATORY_TRACT
  Filled 2017-12-14: qty 28

## 2017-12-14 MED ORDER — FENTANYL CITRATE (PF) 100 MCG/2ML IJ SOLN
25.0000 ug | INTRAMUSCULAR | Status: DC | PRN
  Administered 2017-12-14 (×2): 50 ug via INTRAVENOUS

## 2017-12-14 MED ORDER — POLYETHYLENE GLYCOL 3350 17 G PO PACK: 17.0000 g | PACK | Freq: Every day | ORAL | Status: DC | PRN

## 2017-12-14 MED ORDER — MIDAZOLAM HCL 5 MG/5ML IJ SOLN
INTRAMUSCULAR | Status: DC | PRN
  Administered 2017-12-14: 2 mg via INTRAVENOUS

## 2017-12-14 MED ORDER — METHOCARBAMOL 1000 MG/10ML IJ SOLN
500.0000 mg | Freq: Four times a day (QID) | INTRAVENOUS | Status: DC | PRN
  Filled 2017-12-14: qty 5

## 2017-12-14 MED ORDER — CEFAZOLIN SODIUM-DEXTROSE 2-4 GM/100ML-% IV SOLN
2.0000 g | Freq: Four times a day (QID) | INTRAVENOUS | Status: AC
  Administered 2017-12-14 (×2): 2 g via INTRAVENOUS
  Filled 2017-12-14 (×2): qty 100

## 2017-12-14 MED ORDER — PHENYLEPHRINE 40 MCG/ML (10ML) SYRINGE FOR IV PUSH (FOR BLOOD PRESSURE SUPPORT)
PREFILLED_SYRINGE | INTRAVENOUS | Status: AC
  Filled 2017-12-14: qty 10

## 2017-12-14 MED ORDER — HYDROMORPHONE HCL 1 MG/ML IJ SOLN
0.5000 mg | INTRAMUSCULAR | Status: DC | PRN
  Administered 2017-12-14 (×3): 0.5 mg via INTRAVENOUS

## 2017-12-14 MED ORDER — BUPIVACAINE IN DEXTROSE 0.75-8.25 % IT SOLN
INTRATHECAL | Status: DC | PRN
  Administered 2017-12-14: 2 mL via INTRATHECAL

## 2017-12-14 MED ORDER — HYDROMORPHONE HCL 1 MG/ML IJ SOLN
INTRAMUSCULAR | Status: AC
  Filled 2017-12-14: qty 1

## 2017-12-14 MED ORDER — ALBUTEROL SULFATE (2.5 MG/3ML) 0.083% IN NEBU
2.5000 mg | INHALATION_SOLUTION | Freq: Four times a day (QID) | RESPIRATORY_TRACT | Status: DC | PRN
  Administered 2017-12-15: 2.5 mg via RESPIRATORY_TRACT
  Filled 2017-12-14 (×2): qty 3

## 2017-12-14 MED ORDER — EPHEDRINE SULFATE-NACL 50-0.9 MG/10ML-% IV SOSY
PREFILLED_SYRINGE | INTRAVENOUS | Status: DC | PRN
  Administered 2017-12-14: 10 mg via INTRAVENOUS

## 2017-12-14 MED ORDER — ONDANSETRON HCL 4 MG PO TABS: 4.0000 mg | ORAL_TABLET | Freq: Four times a day (QID) | ORAL | Status: DC | PRN

## 2017-12-14 MED ORDER — DOCUSATE SODIUM 100 MG PO CAPS: 100.0000 mg | ORAL_CAPSULE | Freq: Two times a day (BID) | ORAL | 0 refills | Status: DC

## 2017-12-14 MED ORDER — PROPOFOL 10 MG/ML IV BOLUS
INTRAVENOUS | Status: DC | PRN
  Administered 2017-12-14: 160 mg via INTRAVENOUS
  Administered 2017-12-14: 100 mg via INTRAVENOUS
  Administered 2017-12-14 (×2): 20 mg via INTRAVENOUS

## 2017-12-14 MED ORDER — EPHEDRINE 5 MG/ML INJ
INTRAVENOUS | Status: AC
  Filled 2017-12-14: qty 10

## 2017-12-14 MED ORDER — SODIUM CHLORIDE 0.9 % IV SOLN
INTRAVENOUS | Status: DC
  Administered 2017-12-14 – 2017-12-18 (×2): via INTRAVENOUS

## 2017-12-14 MED ORDER — ONDANSETRON HCL 4 MG/2ML IJ SOLN
INTRAMUSCULAR | Status: DC | PRN
  Administered 2017-12-14: 4 mg via INTRAVENOUS

## 2017-12-14 MED ORDER — OXYCODONE HCL 5 MG PO TABS
ORAL_TABLET | ORAL | Status: AC
  Filled 2017-12-14: qty 2

## 2017-12-14 MED ORDER — MAGNESIUM CITRATE PO SOLN
1.0000 | Freq: Once | ORAL | Status: AC | PRN
  Administered 2017-12-18: 1 via ORAL
  Filled 2017-12-14: qty 296

## 2017-12-14 MED ORDER — GABAPENTIN 300 MG PO CAPS
300.0000 mg | ORAL_CAPSULE | Freq: Two times a day (BID) | ORAL | Status: DC
  Administered 2017-12-14 – 2017-12-18 (×8): 300 mg via ORAL
  Filled 2017-12-14 (×8): qty 1

## 2017-12-14 MED ORDER — APIXABAN 5 MG PO TABS
5.0000 mg | ORAL_TABLET | Freq: Two times a day (BID) | ORAL | Status: DC
  Administered 2017-12-15 – 2017-12-18 (×7): 5 mg via ORAL
  Filled 2017-12-14 (×7): qty 1

## 2017-12-14 MED ORDER — PATIROMER SORBITEX CALCIUM 8.4 G PO PACK
8.4000 g | PACK | ORAL | Status: DC
  Administered 2017-12-15 – 2017-12-17 (×3): 8.4 g via ORAL
  Filled 2017-12-14 (×4): qty 1

## 2017-12-14 MED ORDER — TAMSULOSIN HCL 0.4 MG PO CAPS
0.4000 mg | ORAL_CAPSULE | Freq: Every evening | ORAL | Status: DC
  Administered 2017-12-14 – 2017-12-17 (×4): 0.4 mg via ORAL
  Filled 2017-12-14 (×4): qty 1

## 2017-12-14 MED ORDER — MONTELUKAST SODIUM 10 MG PO TABS
10.0000 mg | ORAL_TABLET | Freq: Every day | ORAL | Status: DC
  Administered 2017-12-14 – 2017-12-17 (×4): 10 mg via ORAL
  Filled 2017-12-14 (×4): qty 1

## 2017-12-14 MED ORDER — MIDAZOLAM HCL 2 MG/2ML IJ SOLN
INTRAMUSCULAR | Status: AC
  Filled 2017-12-14: qty 2

## 2017-12-14 MED ORDER — ALBUMIN HUMAN 5 % IV SOLN
INTRAVENOUS | Status: DC | PRN
  Administered 2017-12-14 (×3): via INTRAVENOUS

## 2017-12-14 MED ORDER — BUPIVACAINE HCL 0.5 % IJ SOLN
INTRAMUSCULAR | Status: AC
  Filled 2017-12-14: qty 1

## 2017-12-14 MED ORDER — BUPIVACAINE LIPOSOME 1.3 % IJ SUSP
INTRAMUSCULAR | Status: DC | PRN
  Administered 2017-12-14: 20 mL

## 2017-12-14 MED ORDER — TRANEXAMIC ACID-NACL 1000-0.7 MG/100ML-% IV SOLN
1000.0000 mg | Freq: Once | INTRAVENOUS | Status: AC
  Administered 2017-12-14: 1000 mg via INTRAVENOUS
  Filled 2017-12-14: qty 100

## 2017-12-14 MED ORDER — BUPIVACAINE HCL 0.5 % IJ SOLN
INTRAMUSCULAR | Status: DC | PRN
  Administered 2017-12-14: 40 mL

## 2017-12-14 MED ORDER — LIDOCAINE 2% (20 MG/ML) 5 ML SYRINGE
INTRAMUSCULAR | Status: AC
  Filled 2017-12-14: qty 5

## 2017-12-14 MED ORDER — DIPHENHYDRAMINE HCL 12.5 MG/5ML PO ELIX: 12.5000 mg | ORAL_SOLUTION | ORAL | Status: DC | PRN

## 2017-12-14 MED ORDER — DEXAMETHASONE SODIUM PHOSPHATE 10 MG/ML IJ SOLN
10.0000 mg | Freq: Two times a day (BID) | INTRAMUSCULAR | Status: AC
  Administered 2017-12-14 – 2017-12-15 (×3): 10 mg via INTRAVENOUS
  Filled 2017-12-14 (×3): qty 1

## 2017-12-14 MED ORDER — ONDANSETRON HCL 4 MG/2ML IJ SOLN: 4.0000 mg | Freq: Four times a day (QID) | INTRAMUSCULAR | Status: DC | PRN

## 2017-12-14 MED ORDER — BISACODYL 5 MG PO TBEC
5.0000 mg | DELAYED_RELEASE_TABLET | Freq: Every day | ORAL | Status: DC | PRN
  Administered 2017-12-17: 5 mg via ORAL
  Filled 2017-12-14 (×2): qty 1

## 2017-12-14 MED ORDER — PHENYLEPHRINE 40 MCG/ML (10ML) SYRINGE FOR IV PUSH (FOR BLOOD PRESSURE SUPPORT)
PREFILLED_SYRINGE | INTRAVENOUS | Status: DC | PRN
  Administered 2017-12-14: 80 ug via INTRAVENOUS
  Administered 2017-12-14: 40 ug via INTRAVENOUS
  Administered 2017-12-14 (×4): 80 ug via INTRAVENOUS
  Administered 2017-12-14: 40 ug via INTRAVENOUS

## 2017-12-14 MED ORDER — ALUM & MAG HYDROXIDE-SIMETH 200-200-20 MG/5ML PO SUSP: 30.0000 mL | ORAL | Status: DC | PRN

## 2017-12-14 MED ORDER — KETOTIFEN FUMARATE 0.025 % OP SOLN
2.0000 [drp] | Freq: Two times a day (BID) | OPHTHALMIC | Status: DC
  Administered 2017-12-14 – 2017-12-18 (×8): 2 [drp] via OPHTHALMIC
  Filled 2017-12-14: qty 5

## 2017-12-14 MED ORDER — ONDANSETRON HCL 4 MG/2ML IJ SOLN: 4.0000 mg | Freq: Once | INTRAMUSCULAR | Status: DC | PRN

## 2017-12-14 MED ORDER — OXYCODONE-ACETAMINOPHEN 5-325 MG PO TABS: 1.0000 | ORAL_TABLET | Freq: Four times a day (QID) | ORAL | 0 refills | Status: DC | PRN

## 2017-12-14 MED ORDER — 0.9 % SODIUM CHLORIDE (POUR BTL) OPTIME
TOPICAL | Status: DC | PRN
  Administered 2017-12-14: 1000 mL

## 2017-12-14 MED ORDER — DOCUSATE SODIUM 100 MG PO CAPS
100.0000 mg | ORAL_CAPSULE | Freq: Two times a day (BID) | ORAL | Status: DC
  Administered 2017-12-14 – 2017-12-18 (×9): 100 mg via ORAL
  Filled 2017-12-14 (×8): qty 1

## 2017-12-14 MED ORDER — OXYCODONE HCL 5 MG PO TABS
5.0000 mg | ORAL_TABLET | ORAL | Status: DC | PRN
  Administered 2017-12-14 – 2017-12-18 (×8): 10 mg via ORAL
  Filled 2017-12-14: qty 2
  Filled 2017-12-14: qty 1
  Filled 2017-12-14 (×3): qty 2
  Filled 2017-12-14: qty 1
  Filled 2017-12-14 (×4): qty 2

## 2017-12-14 MED ORDER — METHOCARBAMOL 500 MG PO TABS
500.0000 mg | ORAL_TABLET | Freq: Four times a day (QID) | ORAL | Status: DC | PRN
  Administered 2017-12-14 – 2017-12-18 (×5): 500 mg via ORAL
  Filled 2017-12-14 (×4): qty 1

## 2017-12-14 MED ORDER — DEXAMETHASONE SODIUM PHOSPHATE 10 MG/ML IJ SOLN
INTRAMUSCULAR | Status: AC
  Filled 2017-12-14: qty 1

## 2017-12-14 MED ORDER — CHLORHEXIDINE GLUCONATE 4 % EX LIQD: 60.0000 mL | Freq: Once | CUTANEOUS | Status: DC

## 2017-12-14 MED ORDER — LACTATED RINGERS IV SOLN
INTRAVENOUS | Status: DC
  Administered 2017-12-14 (×2): via INTRAVENOUS

## 2017-12-14 SURGICAL SUPPLY — 62 items
BENZOIN TINCTURE PRP APPL 2/3 (GAUZE/BANDAGES/DRESSINGS) ×3 IMPLANT
BLADE CLIPPER SURG (BLADE) IMPLANT
BNDG COHESIVE 6X5 TAN STRL LF (GAUZE/BANDAGES/DRESSINGS) IMPLANT
BNDG GAUZE ELAST 4 BULKY (GAUZE/BANDAGES/DRESSINGS) IMPLANT
CELLS DAT CNTRL 66122 CELL SVR (MISCELLANEOUS) IMPLANT
CLOSURE STERI-STRIP 1/2X4 (GAUZE/BANDAGES/DRESSINGS)
CLSR STERI-STRIP ANTIMIC 1/2X4 (GAUZE/BANDAGES/DRESSINGS) IMPLANT
COVER PERINEAL POST (MISCELLANEOUS) ×3 IMPLANT
COVER SURGICAL LIGHT HANDLE (MISCELLANEOUS) ×3 IMPLANT
COVER WAND RF STERILE (DRAPES) ×3 IMPLANT
DRAPE C-ARM 42X72 X-RAY (DRAPES) ×3 IMPLANT
DRAPE STERI IOBAN 125X83 (DRAPES) ×3 IMPLANT
DRAPE U-SHAPE 47X51 STRL (DRAPES) ×6 IMPLANT
DRESSING AQUACEL AG SP 3.5X10 (GAUZE/BANDAGES/DRESSINGS) ×1 IMPLANT
DRSG AQUACEL AG ADV 3.5X10 (GAUZE/BANDAGES/DRESSINGS) ×3 IMPLANT
DRSG AQUACEL AG SP 3.5X10 (GAUZE/BANDAGES/DRESSINGS) ×3
DURAPREP 26ML APPLICATOR (WOUND CARE) ×3 IMPLANT
ELECT BLADE 4.0 EZ CLEAN MEGAD (MISCELLANEOUS)
ELECT CAUTERY BLADE 6.4 (BLADE) ×3 IMPLANT
ELECT REM PT RETURN 9FT ADLT (ELECTROSURGICAL) ×3
ELECTRODE BLDE 4.0 EZ CLN MEGD (MISCELLANEOUS) IMPLANT
ELECTRODE REM PT RTRN 9FT ADLT (ELECTROSURGICAL) ×1 IMPLANT
ELIMINATOR HOLE APEX DEPUY (Hips) ×3 IMPLANT
GLOVE BIOGEL PI IND STRL 8 (GLOVE) ×2 IMPLANT
GLOVE BIOGEL PI INDICATOR 8 (GLOVE) ×4
GLOVE ECLIPSE 7.5 STRL STRAW (GLOVE) ×6 IMPLANT
GOWN STRL REUS W/ TWL LRG LVL3 (GOWN DISPOSABLE) ×2 IMPLANT
GOWN STRL REUS W/ TWL XL LVL3 (GOWN DISPOSABLE) ×2 IMPLANT
GOWN STRL REUS W/TWL LRG LVL3 (GOWN DISPOSABLE) ×4
GOWN STRL REUS W/TWL XL LVL3 (GOWN DISPOSABLE) ×4
HEAD CERAMIC 36 PLUS 8.5 12 14 (Hips) ×3 IMPLANT
HOOD PEEL AWAY FACE SHEILD DIS (HOOD) ×6 IMPLANT
KIT BASIN OR (CUSTOM PROCEDURE TRAY) ×3 IMPLANT
KIT TURNOVER KIT B (KITS) ×3 IMPLANT
MANIFOLD NEPTUNE II (INSTRUMENTS) ×3 IMPLANT
NEEDLE SPNL 22GX3.5 QUINCKE BK (NEEDLE) ×3 IMPLANT
NS IRRIG 1000ML POUR BTL (IV SOLUTION) ×3 IMPLANT
PACK TOTAL JOINT (CUSTOM PROCEDURE TRAY) ×3 IMPLANT
PACK UNIVERSAL I (CUSTOM PROCEDURE TRAY) ×3 IMPLANT
PAD ARMBOARD 7.5X6 YLW CONV (MISCELLANEOUS) ×6 IMPLANT
PIN SECT CUP 56MM (Hips) ×3 IMPLANT
PINNACLE ALTRX PLUS 4 N 36X56 (Hips) ×3 IMPLANT
RTRCTR WOUND ALEXIS 18CM MED (MISCELLANEOUS)
RTRCTR WOUND ALEXIS 18CM SML (INSTRUMENTS) ×3
SAVER CELL AAL HAEMONETICS (INSTRUMENTS) ×1 IMPLANT
SAW OSC TIP CART 19.5X105X1.3 (SAW) ×3 IMPLANT
SPONGE LAP 18X18 X RAY DECT (DISPOSABLE) IMPLANT
STAPLER VISISTAT 35W (STAPLE) IMPLANT
STEM CORAIL KA12 (Stem) ×3 IMPLANT
SUT ETHIBOND NAB CT1 #1 30IN (SUTURE) ×6 IMPLANT
SUT MNCRL AB 3-0 PS2 18 (SUTURE) IMPLANT
SUT VIC AB 0 CT1 27 (SUTURE) ×2
SUT VIC AB 0 CT1 27XBRD ANBCTR (SUTURE) ×1 IMPLANT
SUT VIC AB 1 CT1 27 (SUTURE) ×4
SUT VIC AB 1 CT1 27XBRD ANBCTR (SUTURE) ×2 IMPLANT
SUT VIC AB 2-0 CT1 27 (SUTURE) ×2
SUT VIC AB 2-0 CT1 TAPERPNT 27 (SUTURE) ×1 IMPLANT
SYR 50ML LL SCALE MARK (SYRINGE) ×3 IMPLANT
TOWEL OR 17X24 6PK STRL BLUE (TOWEL DISPOSABLE) ×3 IMPLANT
TOWEL OR 17X26 10 PK STRL BLUE (TOWEL DISPOSABLE) ×3 IMPLANT
TRAY CATH 16FR W/PLASTIC CATH (SET/KITS/TRAYS/PACK) IMPLANT
TRAY FOLEY CATH SILVER 16FR (SET/KITS/TRAYS/PACK) IMPLANT

## 2017-12-14 NOTE — H&P (Addendum)
TOTAL HIP ADMISSION H&P  Patient is admitted for left total hip arthroplasty.  Subjective:  Chief Complaint: left hip pain  HPI: [name], 67 y.o. male, has a history of pain and functional disability in the left hip(s) due to arthritis and patient has failed non-surgical conservative treatments for greater than 12 weeks to include NSAID's and/or analgesics, corticosteriod injections, flexibility and strengthening excercises, supervised PT with diminished ADL's post treatment, use of assistive devices and activity modification.  Onset of symptoms was gradual starting 3 years ago with gradually worsening course since that time.The patient noted no past surgery on the left hip(s).  Patient currently rates pain in the left hip at 8 out of 10 with activity. Patient has night pain, worsening of pain with activity and weight bearing, trendelenberg gait, pain that interfers with activities of daily living, pain with passive range of motion and joint swelling. Patient has evidence of subchondral sclerosis, periarticular osteophytes, joint subluxation and joint space narrowing by imaging studies. This condition presents safety issues increasing the risk of falls. This patient has had failure of all reasonable conservative care.  There is no current active infection.  Patient Active Problem List   Diagnosis Date Noted  . 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 Atherosclerosis of abdominal aorta ([place]) 09/15/2017  . Chronic deep vein thrombosis (DVT) of left popliteal vein ([place]) 06/26/2017  . Primary osteoarthritis of left knee 12/19/2016  . Anemia, unspecified 04/30/2016  . History of benign neoplasm of adrenal gland 02/11/2016  . History of iron deficiency anemia 10/10/2015  . BPH (benign prostatic hyperplasia) 06/20/2015  . ED (erectile dysfunction) 06/20/2015  . Allergic  rhinitis, seasonal 06/20/2015  . Anemia of chronic disease 06/20/2015  . Hypogonadism in male 06/20/2015  . Asthma, well controlled, moderate persistent 06/20/2015  . Hypertension, benign 06/20/2015  . Hyperglycemia 06/20/2015  . History of shingles 06/20/2015  . GERD without esophagitis 06/20/2015  . Chronic radicular low back pain 06/20/2015  . History of epilepsy 06/20/2015  . Migraine without aura and without status migrainosus, not intractable 06/20/2015  . Dyslipidemia 06/20/2015  . Primary osteoarthritis of both knees 06/20/2015   Past Medical History:  Diagnosis Date  . Allergic rhinitis   . Anemia   . BPH (benign prostatic hyperplasia)   . Chronic kidney disease   . Chronic sinusitis   . Complication of anesthesia    work up during surgery 2x in the past   . Decreased libido   . DVT (deep venous thrombosis) (H[facility])    left leg after left knee surgery  . ED (erectile dysfunction)   . Fatigue   . Hematuria   . HTN (hypertension)    history of  . Hypogonadism in male   . Hypokalemia    history of   . IBS (irritable bowel syndrome)   . Low serum vitamin D   . Lumbago   . Migraine   . Mild intermittent asthma   . Osteoarthritis of hip    left (bone-on-bone) Dr. Alta Corning & Alvina Filbert. Modena Slater, PA-C  . Pneumonia    walking pneumonia  . Reflux   . Shingles   . Unilateral inguinal hernia without obstruction or gangrene     Past Surgical History:  Procedure Laterality Date  . ADRENALECTOMY Left 02/11/2016   [facility]  . COLONOSCOPY  02/2012   normal  . JOINT REPLACEMENT    .  KNEE ARTHROSCOPY Left 10/06/2009  . SINUS EXPLORATION    . TOTAL KNEE ARTHROPLASTY Right 06/20/2016  . TOTAL KNEE ARTHROPLASTY Right 06/20/2016   Procedure: TOTAL KNEE ARTHROPLASTY;  Surgeon: Dorna Leitz, MD;  Location: [facility];  Service: Orthopedics;  Laterality: Right;  . TOTAL KNEE ARTHROPLASTY Left 12/19/2016   Procedure: LEFT TOTAL KNEE ARTHROPLASTY;  Surgeon: Dorna Leitz, MD;  Location: [facility];  Service: Orthopedics;  Laterality: Left;  Adductor Block    Current Facility-Administered Medications  Medication Dose Route Frequency Provider Last Rate Last Dose  . bupivacaine liposome (EXPAREL) 1.3 % injection 266 mg  20 mL Infiltration Once Dorna Leitz, MD      . ceFAZolin (ANCEF) 3 g in dextrose 5 % 50 mL IVPB  3 g Intravenous Once Dorna Leitz, MD      . tranexamic acid (CYKLOKAPRON) IVPB 1,000 mg  1,000 mg Intravenous To OR Dorna Leitz, MD       Current Outpatient Medications  Medication Sig Dispense Refill Last Dose  . albuterol (PROAIR HFA) 108 (90 Base) MCG/ACT inhaler Inhale 2 puffs into the lungs every 6 (six) hours as needed for wheezing or shortness of breath. 1 Inhaler 0 Taking  . azelastine (OPTIVAR) 0.05 % ophthalmic solution INSTILL 2 DROPS INTO BOTH EYES TWICE A DAY (Patient taking differently: Place 2 drops into both eyes 2 (two) times daily. ) 6 mL 5 Taking  . BREO ELLIPTA 200-25 MCG/INH AEPB Inhale 1 puff into the lungs daily. 60 each 5 Taking  . cholecalciferol (VITAMIN D3) 25 MCG (1000 UT) tablet Take 1,000 Units by mouth daily.   Taking  . docusate sodium (COLACE) 100 MG capsule Take 1 capsule (100 mg total) by mouth 2 (two) times daily. (Patient taking differently: Take 100 mg by mouth daily as needed for moderate constipation. ) 30 capsule 0 Taking  . fexofenadine (ALLEGRA) 180 MG tablet Take 180 mg by mouth daily as needed for allergies or rhinitis.   Taking  . fexofenadine-pseudoephedrine (ALLEGRA-D 24) 180-240 MG 24 hr tablet Take 1 tablet by mouth daily as needed (for allergies.).   Taking  . fluticasone (FLONASE) 50 MCG/ACT nasal spray Place 2 sprays into both nostrils daily. (Patient taking differently: Place 2 sprays into both nostrils daily as needed for allergies. ) 16 g 5 Taking  . Glucosamine-MSM-Hyaluronic Acd (JOINT HEALTH PO) Take 1 tablet by mouth daily. INSTAFLEX ADVANCED JOINT SUPPORT   Taking  . Lactobacillus Rhamnosus, GG, (CULTURELLE) CAPS  Take 1 capsule by mouth daily.    Taking  . Misc Natural Products (GINSENG COMPLEX PO) Take 1 tablet by mouth daily.   Taking  . Misc Natural Products (NF FORMULAS TESTOSTERONE PO) Take 2 tablets by mouth daily. NATURE'S PLUS T-MALE SUPPLEMENT   Taking  . Multiple Vitamins-Minerals (MULTIVITAMIN ADULTS 50+ PO) Take 1 tablet by mouth daily. NATURE'S CODE MEN OVER 50 MULTIVITAMIN PACK    Taking  . OVER THE COUNTER MEDICATION Take 1 tablet by mouth 3 (three) times daily. TEST-HD TESTOSTERONE SUPPORT    Taking  . patiromer (VELTASSA) 8.4 g packet Take 8.4 g by mouth daily.    Taking  . Polyethyl Glycol-Propyl Glycol (SYSTANE ULTRA) 0.4-0.3 % SOLN Place 1-2 drops into both eyes 3 (three) times daily as needed (for dry/irritated eyes.).   Taking  . tadalafil (CIALIS) 5 MG tablet Take 1 tablet (5 mg total) by mouth daily as needed for erectile dysfunction. (Patient taking differently: Take 5 mg by mouth daily. ) 30 tablet 5 Taking  .  tamsulosin (FLOMAX) 0.4 MG CAPS capsule Take 1 capsule (0.4 mg total) by mouth every evening. 30 capsule 5 Taking  . tiZANidine (ZANAFLEX) 2 MG tablet Take 1 tablet (2 mg total) by mouth 3 (three) times daily. 90 tablet 5 Taking  . traMADol (ULTRAM) 50 MG tablet Take 50 mg by mouth every 6 (six) hours as needed for moderate pain.    Taking  . vitamin B-12 (CYANOCOBALAMIN) 500 MCG tablet Take 500 mcg by mouth daily.   Taking  . apixaban (ELIQUIS) 5 MG TABS tablet Take 1 tablet (5 mg total) by mouth 2 (two) times daily. 60 tablet 5   . montelukast (SINGULAIR) 10 MG tablet Take 1 tablet (10 mg total) by mouth daily. 30 tablet 5    Allergies  Allergen Reactions  . Lactose Intolerance (Gi) Other (See Comments)    MIGRAINES  . Shellfish Allergy Other (See Comments)    Congestion/breathing problems/migraines.    Social History   Tobacco Use  . Smoking status: Former Smoker    Packs/day: 1.00    Years: 10.00    Pack years: 10.00  . Smokeless tobacco: Never Used  . Tobacco  comment: 38 years ago 61 when he stopped  Substance Use Topics  . Alcohol use: No    Alcohol/week: 0.0 standard drinks    Family History  Problem Relation Age of Onset  . Diabetes Mother   . Heart disease Mother   . Lung disease Mother   . Seizures Maternal Grandmother      ROS ROS: I have reviewed the patient's review of systems thoroughly and there are no positive responses as relates to the HPI. Objective:  Physical Exam  Vital signs in last 24 hours:    Vitals:   12/14/17 1041  BP: 113/75  Pulse: 73  Resp: 18  Temp: (!) 97.5 F (36.4 C)  SpO2: 97%    Well-developed well-nourished patient in no acute distress. Alert and oriented x3 HEENT:within normal limits Cardiac: Regular rate and rhythm Pulmonary: Lungs clear to auscultation Abdomen: Soft and nontender.  Normal active bowel sounds  Musculoskeletal: (left hip: Limited range of motion.  Pain with internal rotation.  No instability.  No effusion.  Grinding through range of motion. Labs: Recent Results (from the past 2160 hour(s))  PSA     Status: None   Collection Time: 09/15/17  4:11 PM  Result Value Ref Range   PSA 2.7 < OR = 4.0 ng/mL    Comment: The total PSA value from this assay system is  standardized against the WHO standard. The test  result will be approximately 20% lower when compared  to the equimolar-standardized total PSA (Beckman  Coulter). Comparison of serial PSA results should be  interpreted with this fact in mind. . This test was performed using the Siemens  chemiluminescent method. Values obtained from  different assay methods cannot be used interchangeably. PSA levels, regardless of value, should not be interpreted as absolute evidence of the presence or absence of disease.   Lipid panel     Status: Abnormal   Collection Time: 09/15/17  4:11 PM  Result Value Ref Range   Cholesterol 178 <200 mg/dL   HDL 44 >40 mg/dL   Triglycerides 151 (H) <150 mg/dL   LDL Cholesterol (Calc)  108 (H) mg/dL (calc)    Comment: Reference range: <100 . Desirable range <100 mg/dL for primary prevention;   <70 mg/dL for patients with CHD or diabetic patients  with > or = 2 CHD  risk factors. Marland Kitchen LDL-C is now calculated using the Martin-Hopkins  calculation, which is a validated novel method providing  better accuracy than the Friedewald equation in the  estimation of LDL-C.  Cresenciano Genre et al. Annamaria Helling. 4540;981(19): 2061-2068  (http://education.[facility].com/faq/FAQ164)    Total CHOL/HDL Ratio 4.0 <5.0 (calc)   Non-HDL Cholesterol (Calc) 134 (H) <130 mg/dL (calc)    Comment: For patients with diabetes plus 1 major ASCVD risk  factor, treating to a non-HDL-C goal of <100 mg/dL  (LDL-C of <70 mg/dL) is considered a therapeutic  option.   Hemoglobin A1c     Status: None   Collection Time: 09/15/17  4:11 PM  Result Value Ref Range   Hgb A1c MFr Bld 5.5 <5.7 % of total Hgb    Comment: For the purpose of screening for the presence of diabetes: . <5.7%       Consistent with the absence of diabetes 5.7-6.4%    Consistent with increased risk for diabetes             (prediabetes) > or =6.5%  Consistent with diabetes . This assay result is consistent with a decreased risk of diabetes. . Currently, no consensus exists regarding use of hemoglobin A1c for diagnosis of diabetes in children. . According to American Diabetes Association (ADA) guidelines, hemoglobin A1c <7.0% represents optimal control in non-pregnant diabetic patients. Different metrics may apply to specific patient populations.  Standards of Medical Care in Diabetes(ADA). .    Mean Plasma Glucose 111 (calc)   eAG (mmol/L) 6.2 (calc)  Iron and TIBC     Status: Abnormal   Collection Time: 11/24/17  4:11 PM  Result Value Ref Range   Iron 32 (L) 45 - 182 ug/dL   TIBC 299 250 - 450 ug/dL   Saturation Ratios 11 (L) 17.9 - 39.5 %   UIBC 267 ug/dL    Comment: Performed at [facility], [address] [place], [place] [address]  Ferritin     Status: Abnormal   Collection Time: 11/24/17  4:11 PM  Result Value Ref Range   Ferritin 394 (H) 24 - 336 ng/mL    Comment: Performed at [facility], [address] [place], [place] [address]  CBC     Status: Abnormal   Collection Time: 11/24/17  4:11 PM  Result Value Ref Range   WBC 6.5 4.0 - 10.5 K/uL   RBC 4.10 (L) 4.22 - 5.81 MIL/uL   Hemoglobin 11.5 (L) 13.0 - 17.0 g/dL   HCT 36.4 (L) 39.0 - 52.0 %   MCV 88.8 80.0 - 100.0 fL   MCH 28.0 26.0 - 34.0 pg   MCHC 31.6 30.0 - 36.0 g/dL   RDW 13.2 11.5 - 15.5 %   Platelets 231 150 - 400 K/uL   nRBC 0.0 0.0 - 0.2 %    Comment: Performed at [facility], [address] [place], [place] [address]  Surgical pcr screen     Status: None   Collection Time: 12/02/17 10:58 AM  Result Value Ref Range   MRSA, PCR NEGATIVE NEGATIVE   Staphylococcus aureus NEGATIVE NEGATIVE    Comment: (NOTE) The Xpert SA Assay (FDA approved for NASAL specimens in patients 54 years of age and older), is one component of a comprehensive surveillance program. It is not intended to diagnose infection nor to guide or monitor treatment. Performed at [facility], [place] [address] [place], [place] [address]   APTT     Status:  None   Collection Time: 12/02/17 10:59 AM  Result Value Ref Range   aPTT 30 24 - 36 seconds    Comment: Performed at [facility] [address], [place], [place] [address]  CBC WITH DIFFERENTIAL     Status: Abnormal   Collection Time: 12/02/17 10:59 AM  Result Value Ref Range   WBC 5.8 4.0 - 10.5 K/uL   RBC 4.41 4.22 - 5.81 MIL/uL   Hemoglobin 12.1 (L) 13.0 - 17.0 g/dL   HCT 39.8 39.0 - 52.0 %   MCV 90.2 80.0 - 100.0 fL   MCH 27.4 26.0 - 34.0 pg   MCHC 30.4 30.0 - 36.0 g/dL   RDW 13.2 11.5 - 15.5 %   Platelets 234 150 - 400 K/uL   nRBC 0.0 0.0 - 0.2 %   Neutrophils Relative % 57 %   Neutro Abs 3.3 1.7 - 7.7 K/uL   Lymphocytes Relative 31 %   Lymphs Abs 1.8  0.7 - 4.0 K/uL   Monocytes Relative 9 %   Monocytes Absolute 0.5 0.1 - 1.0 K/uL   Eosinophils Relative 2 %   Eosinophils Absolute 0.1 0.0 - 0.5 K/uL   Basophils Relative 1 %   Basophils Absolute 0.1 0.0 - 0.1 K/uL   Immature Granulocytes 0 %   Abs Immature Granulocytes 0.02 0.00 - 0.07 K/uL    Comment: Performed at [facility] [address], [place], [place] [address]  Comprehensive metabolic panel     Status: Abnormal   Collection Time: 12/02/17 10:59 AM  Result Value Ref Range   Sodium 135 135 - 145 mmol/L   Potassium 4.1 3.5 - 5.1 mmol/L   Chloride 101 98 - 111 mmol/L   CO2 27 22 - 32 mmol/L   Glucose, Bld 98 70 - 99 mg/dL   BUN 29 (H) 8 - 23 mg/dL   Creatinine, Ser 1.26 (H) 0.61 - 1.24 mg/dL   Calcium 9.8 8.9 - 10.3 mg/dL   Total Protein 7.7 6.5 - 8.1 g/dL   Albumin 4.0 3.5 - 5.0 g/dL   AST 27 15 - 41 U/L   ALT 23 0 - 44 U/L   Alkaline Phosphatase 82 38 - 126 U/L   Total Bilirubin 0.6 0.3 - 1.2 mg/dL   GFR calc non Af Amer 59 (L) >60 mL/min   GFR calc Af Amer >60 >60 mL/min   Anion gap 7 5 - 15    Comment: Performed at [facility] [address], [place], [place] [address]  Protime-INR     Status: None   Collection Time: 12/02/17 10:59 AM  Result Value Ref Range   Prothrombin Time 13.5 11.4 - 15.2 seconds   INR 1.04     Comment: Performed at [facility] [address], [place], [place] [address]  Urinalysis, Routine w reflex microscopic     Status: None   Collection Time: 12/02/17 10:59 AM  Result Value Ref Range   Color, Urine YELLOW YELLOW   APPearance CLEAR CLEAR   Specific Gravity, Urine 1.018 1.005 - 1.030   pH 7.0 5.0 - 8.0   Glucose, UA NEGATIVE NEGATIVE mg/dL   Hgb urine dipstick NEGATIVE NEGATIVE   Bilirubin Urine NEGATIVE NEGATIVE   Ketones, ur NEGATIVE NEGATIVE mg/dL   Protein, ur NEGATIVE NEGATIVE mg/dL   Nitrite NEGATIVE NEGATIVE   Leukocytes, UA NEGATIVE NEGATIVE    Comment: Performed at [facility] [place],  [place] [address]  Type and screen Order type and screen if day of surgery is less than 15 days from draw of preadmission visit or order morning of surgery if day of surgery is greater than 6 days from preadmission visit.     Status: None   Collection Time: 12/02/17 11:25 AM  Result Value Ref Range   ABO/RH(D) O POS    Antibody Screen NEG    Sample Expiration 12/16/2017    Extend sample reason      NO TRANSFUSIONS OR PREGNANCY IN THE PAST 3 MONTHS Performed at [facility], [place] [address], [place], [place] [address]     Estimated body mass index is 35.55 kg/m as calculated from the following:   Height as of 12/02/17: 6\' 2"  (1.88 m).   Weight as of 12/02/17: 125.6 kg.   Imaging Review Plain radiographs demonstrate severe degenerative joint disease of the left hip(s). The bone quality appears to be fair for age and reported activity level.    Preoperative templating of the joint replacement has been completed, documented, and submitted to the Operating Room personnel in order to optimize intra-operative equipment management.     Assessment/Plan:  End stage arthritis, left hip(s)  The patient history, physical examination, clinical judgement of the provider and imaging studies are consistent with end stage degenerative joint disease of the left hip(s) and total hip arthroplasty is deemed medically necessary. The treatment options including medical management, injection therapy, arthroscopy and arthroplasty were discussed at length. The risks and benefits of total hip arthroplasty were presented and reviewed. The risks due to aseptic loosening, infection, stiffness, dislocation/subluxation,  thromboembolic complications and other imponderables were discussed.  The patient acknowledged the explanation, agreed to proceed with the plan and consent was signed. Patient is being admitted for inpatient treatment for surgery, pain control, PT, OT, prophylactic antibiotics, VTE  prophylaxis, progressive ambulation and ADL's and discharge planning.The patient is planning to be discharged home with home health services

## 2017-12-14 NOTE — Progress Notes (Signed)
Dr. Ola Spurr notified patient takes Allegra-D every day and took DOS

## 2017-12-14 NOTE — Anesthesia Preprocedure Evaluation (Signed)
Anesthesia Evaluation  Patient identified by MRN, date of birth, ID band Patient awake    Reviewed: Allergy & Precautions, NPO status , Patient's Chart, lab work & pertinent test results  History of Anesthesia Complications (+) history of anesthetic complications  Airway Mallampati: III  TM Distance: >3 FB Neck ROM: Full    Dental no notable dental hx.    Pulmonary neg pulmonary ROS, asthma (mild, controlled) , former smoker,    Pulmonary exam normal breath sounds clear to auscultation       Cardiovascular hypertension, Pt. on medications + DVT  Normal cardiovascular exam Rhythm:Regular Rate:Normal     Neuro/Psych  Headaches, negative neurological ROS  negative psych ROS   GI/Hepatic negative GI ROS, Neg liver ROS, GERD  ,  Endo/Other  negative endocrine ROS  Renal/GU Renal InsufficiencyRenal diseasenegative Renal ROS  negative genitourinary   Musculoskeletal negative musculoskeletal ROS (+) Arthritis , Osteoarthritis,    Abdominal (+) + obese,   Peds negative pediatric ROS (+)  Hematology negative hematology ROS (+) anemia , On eliquis. Off >72hrs.   Anesthesia Other Findings Obese   Reproductive/Obstetrics negative OB ROS                             Lab Results  Component Value Date   WBC 5.8 12/02/2017   HGB 12.1 (L) 12/02/2017   HCT 39.8 12/02/2017   MCV 90.2 12/02/2017   PLT 234 12/02/2017   Lab Results  Component Value Date   INR 0.99 12/14/2017   INR 1.04 12/02/2017   INR 0.95 12/15/2016   Lab Results  Component Value Date   CREATININE 1.26 (H) 12/02/2017   BUN 29 (H) 12/02/2017   NA 135 12/02/2017   K 4.1 12/02/2017   CL 101 12/02/2017   CO2 27 12/02/2017    Anesthesia Physical  Anesthesia Plan  ASA: III  Anesthesia Plan: Spinal   Post-op Pain Management:    Induction: Intravenous  PONV Risk Score and Plan: 1 and Ondansetron and Propofol  infusion  Airway Management Planned: Mask and Natural Airway  Additional Equipment:   Intra-op Plan:   Post-operative Plan:   Informed Consent: I have reviewed the patients History and Physical, chart, labs and discussed the procedure including the risks, benefits and alternatives for the proposed anesthesia with the patient or authorized representative who has indicated his/her understanding and acceptance.   Dental advisory given  Plan Discussed with: CRNA and Anesthesiologist  Anesthesia Plan Comments:         Anesthesia Quick Evaluation

## 2017-12-14 NOTE — Op Note (Signed)
NAME: SULLIVAN, JACUINDE MEDICAL RECORD EH:[id number] ACCOUNT [id number] DATE OF BIRTH:[birth year] FACILITY: [facility] LOCATION: [facility] PHYSICIAN:Izak Anding L. 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  PROCEDURE:   1.  Left total hip replacement with a Corail stem size 12, a Pinnacle cup porous coated size 56, +4 neutral liner, and a +8 delta ceramic hip ball.   2.  Interpretation of multiple intraoperative fluoroscopic images.  SURGEON:  Dorna Leitz, MD  ASSISTANT:  Gaspar Skeeters, PA-C, who was present for the entire case and assisted with retraction, manipulation of the leg, and closing to minimize OR time.  BRIEF HISTORY:  The patient is a 67 year old male with a long history of significant complaints of left hip pain that has been treated for a long time with conservative care and x-ray showed severe bone-on-bone change in the hip. After failing  conservative care, is taken to the operating room for left total hip replacement.  DESCRIPTION OF PROCEDURE:  The patient was taken to the operating room after adequate anesthesia obtained with a spinal anesthetic, the patient was placed supine on the operating table.  Left leg was then prepped and draped in usual sterile fashion after  he was placed on the Hana bed.  Following this, an incision was made at that point, the patient was not appearing to be under spinal anesthetic and a general anesthetic was given.  At this point, an incision was continued down to the level of the tensor  fascia, which was identified and divided in line with its fibers and the muscle was finger fractured off of the fascia.  Retractors were put in place above and below the femoral neck and at this point the traction was placed and the  capsule was opened  and tagged.  Following this, the provisional neck cut was made.  The head was removed and measured on the back table to 53.  Retractor put in place above and below the socket and the socket was reamed sequentially to a level of 55 and a 56 mm porous  coated Pinnacle cup was hammered into place 45 degrees of lateral opening, 30 degrees of anteversion.  Following this, attention was turned to the stem side, which was externally rotated, extended and adducted.  Unfortunately, because of the patient's  knee flexion contracture, we had a very difficult time getting external rotation through the proximal end of the femur and tried multiple times to gain this access and it was difficult to gain.  Ultimately retractor was put in place.  We were able to  gain some access down the stem.  We were able to open it with a cookie cutter followed by a chili pepper followed by sequential rasping of stems up to a size 11.  We trialed it with an 11.  This was just a touch short and looked like the stem could be  lateralized a little bit.  We dislocated and brought it back up.  Lateralized a little bit went up to a size 12.  Opened and placed a 12 and trialed a +8 ball.  This gave [place] perfect symmetric leg lengths.  Ultimately, the delta ceramic hip ball 36 mm  plus 8 was opened and placed and the hip was then reduced.  Final images were taken and multiple images were taken throughout the case  to make sure that we were in the right location within the stem.  At this point, the hip had been reduced.  Final  images were taken.  The capsule was closed with #1 Vicryl running.  The tensor fascia was closed with 0 Vicryl running.  The skin was closed with 0 and 2-0 Vicryl and 3-0 Monocryl subcuticular.  Benzoin, Steri-Strips applied.  Sterile compressive  dressing was applied.  The patient was taken to recovery was noted to be in satisfactory condition.  Estimated blood loss from the anesthetic record was 2300 mL.   That seemed to be generous to me, but ultimately that with registered as their blood loss.   Of note, Gaspar Skeeters was present throughout the case and assisted in help manipulate the leg to use the surgical intervention and multiple intraoperative fluoroscopic images were taken as outlined.  At this point, the patient was taken to recovery and  noted to be in satisfactory condition.  TN/NUANCE  D:12/14/2017 T:12/14/2017 JOB:[id number]

## 2017-12-14 NOTE — Plan of Care (Signed)
Problem: Education: Goal: Knowledge of General Education information will improve Description Including pain rating scale, medication(s)/side effects and non-pharmacologic comfort measures Outcome: Progressing   Problem: Health Behavior/Discharge Planning: Goal: Ability to manage health-related needs will improve Outcome: Progressing   Problem: Clinical Measurements: Goal: Ability to maintain clinical measurements within normal limits will improve Outcome: Progressing Goal: Respiratory complications will improve Outcome: Progressing   Problem: Nutrition: Goal: Adequate nutrition will be maintained Outcome: Progressing   Problem: Coping: Goal: Level of anxiety will decrease Outcome: Progressing   Problem: Safety: Goal: Ability to remain free from injury will improve Outcome: Progressing   Problem: Skin Integrity: Goal: Risk for impaired skin integrity will decrease Outcome: Progressing   Problem: Education: Goal: Knowledge of the prescribed therapeutic regimen will improve Outcome: Progressing   Problem: Clinical Measurements: Goal: Postoperative complications will be avoided or minimized Outcome: Progressing

## 2017-12-14 NOTE — Transfer of Care (Signed)
Immediate Anesthesia Transfer of Care Note  Patient: [name]  Procedure(s) Performed: TOTAL HIP ARTHROPLASTY ANTERIOR APPROACH (Left Hip)  Patient Location: PACU  Anesthesia Type:General and Spinal  Level of Consciousness: drowsy and patient cooperative  Airway & Oxygen Therapy: Patient Spontanous Breathing and Patient connected to face mask oxygen  Post-op Assessment: Report given to RN and Post -op Vital signs reviewed and stable  Post vital signs: Reviewed and stable  Last Vitals:  Vitals Value Taken Time  BP 131/90 12/14/2017  3:30 PM  Temp 36.2 C 12/14/2017  3:30 PM  Pulse 88 12/14/2017  3:32 PM  Resp 16 12/14/2017  3:32 PM  SpO2 100 % 12/14/2017  3:32 PM  Vitals shown include unvalidated device data.  Last Pain:  Vitals:   12/14/17 1102  TempSrc:   PainSc: 0-No pain         Complications: No apparent anesthesia complications

## 2017-12-14 NOTE — Anesthesia Procedure Notes (Signed)
Spinal  Patient location during procedure: OR Start time: 12/14/2017 12:37 PM End time: 12/14/2017 12:45 PM Staffing Anesthesiologist: Suzette Battiest, MD Performed: anesthesiologist  Preanesthetic Checklist Completed: patient identified, site marked, surgical consent, pre-op evaluation, timeout performed, IV checked, risks and benefits discussed and monitors and equipment checked Spinal Block Patient position: sitting Prep: site prepped and draped and DuraPrep Patient monitoring: blood pressure, continuous pulse ox and heart rate Approach: midline Location: L4-5 Injection technique: single-shot Needle Needle type: Pencan  Needle gauge: 24 G Needle length: 9 cm

## 2017-12-14 NOTE — Brief Op Note (Signed)
12/14/2017  3:11 PM  PATIENT:  [name]  67 y.o. male  PRE-OPERATIVE DIAGNOSIS:  OSTEOARTHRITIS LEFT HIP  POST-OPERATIVE DIAGNOSIS:  OSTEOARTHRITIS LEFT HIP  PROCEDURE:  Procedure(s): TOTAL HIP ARTHROPLASTY ANTERIOR APPROACH (Left)  SURGEON:  Surgeon(s) and Role:    Dorna Leitz, MD - Primary  PHYSICIAN ASSISTANT:   ASSISTANTS: bethune   ANESTHESIA:   spinal and general  EBL:  2300 mL   BLOOD ADMINISTERED:none  DRAINS: none   LOCAL MEDICATIONS USED:  MARCAINE    and OTHER experel  SPECIMEN:  No Specimen  DISPOSITION OF SPECIMEN:  N/A  COUNTS:  YES  TOURNIQUET:  * No tourniquets in log *  DICTATION: .Other Dictation: Dictation Number [id number]  PLAN OF CARE: Admit to inpatient   PATIENT DISPOSITION:  PACU - hemodynamically stable.   Delay start of Pharmacological VTE agent (>24hrs) due to surgical blood loss or risk of bleeding: no

## 2017-12-14 NOTE — Anesthesia Postprocedure Evaluation (Signed)
Anesthesia Post Note  Patient: [name]  Procedure(s) Performed: TOTAL HIP ARTHROPLASTY ANTERIOR APPROACH (Left Hip)     Patient location during evaluation: PACU Anesthesia Type: General Level of consciousness: awake and alert Pain management: pain level controlled Vital Signs Assessment: post-procedure vital signs reviewed and stable Respiratory status: spontaneous breathing, nonlabored ventilation, respiratory function stable and patient connected to nasal cannula oxygen Cardiovascular status: blood pressure returned to baseline and stable Postop Assessment: no apparent nausea or vomiting Anesthetic complications: no    Last Vitals:  Vitals:   12/14/17 1645 12/14/17 1722  BP: 138/85 (!) 133/98  Pulse: 86 89  Resp: 16 16  Temp:  36.6 C  SpO2: 96% 100%    Last Pain:  Vitals:   12/14/17 1722  TempSrc: Oral  PainSc:                  [name]

## 2017-12-14 NOTE — Care Plan (Signed)
Patient has all needed equipment at home. He is scheduled to start OPPT on 12/17/17 @ 1040 at [facility] .    Please contact [place], [place], if this plan should need to change.

## 2017-12-14 NOTE — Anesthesia Procedure Notes (Signed)
Procedure Name: LMA Insertion Date/Time: 12/14/2017 1:15 PM Performed by: Genelle Bal, CRNA Pre-anesthesia Checklist: Patient identified, Emergency Drugs available, Suction available and Patient being monitored Patient Re-evaluated:Patient Re-evaluated prior to induction Oxygen Delivery Method: Circle system utilized Preoxygenation: Pre-oxygenation with 100% oxygen Induction Type: IV induction Ventilation: Mask ventilation without difficulty LMA: LMA inserted LMA Size: 5.0 Number of attempts: 1 Airway Equipment and Method: Bite block Placement Confirmation: positive ETCO2 Tube secured with: Tape Dental Injury: Teeth and Oropharynx as per pre-operative assessment

## 2017-12-14 NOTE — Progress Notes (Signed)
Pt arrived to room 5N26 via bed. Received report from [place], RN in PACU. See assessment. Will continue to monitor.

## 2017-12-15 ENCOUNTER — Encounter (HOSPITAL_COMMUNITY): Payer: Self-pay | Admitting: Orthopedic Surgery

## 2017-12-15 LAB — POCT I-STAT 4, (NA,K, GLUC, HGB,HCT)
Glucose, Bld: 144 mg/dL — ABNORMAL HIGH (ref 70–99)
HCT: 30 % — ABNORMAL LOW (ref 39.0–52.0)
Hemoglobin: 10.2 g/dL — ABNORMAL LOW (ref 13.0–17.0)
Potassium: 4.1 mmol/L (ref 3.5–5.1)
SODIUM: 138 mmol/L (ref 135–145)

## 2017-12-15 LAB — CBC
HCT: 29.8 % — ABNORMAL LOW (ref 39.0–52.0)
Hemoglobin: 9.3 g/dL — ABNORMAL LOW (ref 13.0–17.0)
MCH: 27.3 pg (ref 26.0–34.0)
MCHC: 31.2 g/dL (ref 30.0–36.0)
MCV: 87.4 fL (ref 80.0–100.0)
Platelets: 222 10*3/uL (ref 150–400)
RBC: 3.41 MIL/uL — ABNORMAL LOW (ref 4.22–5.81)
RDW: 13.2 % (ref 11.5–15.5)
WBC: 10 10*3/uL (ref 4.0–10.5)
nRBC: 0 % (ref 0.0–0.2)

## 2017-12-15 LAB — GLUCOSE, CAPILLARY
Glucose-Capillary: 130 mg/dL — ABNORMAL HIGH (ref 70–99)
Glucose-Capillary: 142 mg/dL — ABNORMAL HIGH (ref 70–99)
Glucose-Capillary: 142 mg/dL — ABNORMAL HIGH (ref 70–99)
Glucose-Capillary: 140 mg/dL — ABNORMAL HIGH (ref 70–99)

## 2017-12-15 NOTE — Care Management (Signed)
Patient is Ortho Bundle, discharge plan has been established by Levora Dredge, Case Manager.

## 2017-12-15 NOTE — Progress Notes (Signed)
Physical Therapy Treatment Patient Details Name: [name] MRN: [id number] DOB: Jan 05, 1951 Today's Date: 12/15/2017    History of Present Illness Pt is 67 y.o. male s/p left THA, direct anterior approach (12/14/17), secondary to osteoarthritis. PMH significant for bilateral TKA, DVT of left leg, HTN, CKD, GERD, chronic radicular low back pain, asthma, history of epilepsy.     PT Comments    Pt feeling better than this morning, has been sitting in chair since treatment earlier today. He has no reports of dizziness upon standing, able to ambulate in hall this afternoon. Pt completed bed mobility min guard and all other mobility with hands on min guard for safety, cuing for management of RW and regular breathing. Educated on supine HEP and importance of regular mobility and pain management. DC plan remains appropriate pending safety with stair navigation tomorrow. PT will continue to follow acutely.   Follow Up Recommendations  Follow surgeon's recommendation for DC plan and follow-up therapies;Supervision - Intermittent     Equipment Recommendations  None recommended by PT       Precautions / Restrictions Precautions Precautions: Fall Restrictions Weight Bearing Restrictions: Yes LLE Weight Bearing: Weight bearing as tolerated    Mobility  Bed Mobility Overal bed mobility: Needs Assistance Bed Mobility: Sit to Supine       Sit to supine: Min guard   General bed mobility comments: min guard for safety, vc for use of RLE to assist LLE back into bed, pt returned to supine with use of bedrail, completed in increased time and effort due to pain.   Transfers Overall transfer level: Needs assistance Equipment used: Rolling walker (2 wheeled) Transfers: Sit to/from [facility] Sit to Stand: Min assist Stand pivot transfers: Min assist       General transfer comment: light minA needed for stability during stance, pt completed sit to stand from chair and  stand pivot transfer from chair to bed, with vc for management of RW and safe hand placement with sit from stand.  Ambulation/Gait Ambulation/Gait assistance: Min guard;+2 safety/equipment(chair follow) Gait Distance (Feet): 50 Feet Assistive device: Rolling walker (2 wheeled) Gait Pattern/deviations: Step-to pattern;Decreased stance time - left;Decreased stride length   Gait velocity interpretation: <1.8 ft/sec, indicate of risk for recurrent falls General Gait Details: hands on min guard for safety, vc for management of RW, regular breathing, and has no reports of dizziness while walking. At end of ambulation, pt had 3/4 dyspnea noted with reports of feeling tired and mild dizziness which improved with seated rest.     Balance Overall balance assessment: Needs assistance Sitting-balance support: Bilateral upper extremity supported;Feet supported Sitting balance-Leahy Scale: Fair     Standing balance support: Bilateral upper extremity supported Standing balance-Leahy Scale: Poor Standing balance comment: pt heavily reliant on UE in standing                            Cognition Arousal/Alertness: Awake/alert Behavior During Therapy: WFL for tasks assessed/performed Overall Cognitive Status: Within Functional Limits for tasks assessed                                        Exercises Total Joint Exercises Ankle Circles/Pumps: AROM;Both;10 reps;Seated Heel Slides: AROM;Left;5 reps;Supine Hip ABduction/ADduction: AAROM;Left;5 reps;Supine  All exercises completed in decreased ROM, limited by pain and edema.     General Comments General comments (  skin integrity, edema, etc.): BP at start of treatment was 125/82, with no reports of dizziness with positional change. Some dizziness noted at end of ambulation, resolved wtih seated rest. Edema and some redness present through dressing, wife present for duration of treatment. Ice applied at end of session, pt  instructed to take off after 20 min.       Pertinent Vitals/Pain Pain Assessment: Faces Faces Pain Scale: Hurts even more Pain Location: left hip Pain Descriptors / Indicators: Grimacing;Guarding;Sore;Shooting;Tightness Pain Intervention(s): Limited activity within patient's tolerance;Monitored during session;Repositioned;Ice applied           PT Goals (current goals can now be found in the care plan section) Acute Rehab PT Goals Patient Stated Goal: go home PT Goal Formulation: With patient Time For Goal Achievement: 12/29/17 Potential to Achieve Goals: Fair Progress towards PT goals: Progressing toward goals    Frequency    7X/week      PT Plan Current plan remains appropriate       AM-PAC PT "6 Clicks" Mobility   Outcome Measure  Help needed turning from your back to your side while in a flat bed without using bedrails?: A Lot Help needed moving from lying on your back to sitting on the side of a flat bed without using bedrails?: A Little Help needed moving to and from a bed to a chair (including a wheelchair)?: A Little Help needed standing up from a chair using your arms (e.g., wheelchair or bedside chair)?: A Little Help needed to walk in hospital room?: A Little Help needed climbing 3-5 steps with a railing? : A Lot 6 Click Score: 16    End of Session Equipment Utilized During Treatment: Gait belt Activity Tolerance: Patient tolerated treatment well;Patient limited by fatigue;Patient limited by pain(low BP upon standing) Patient left: in bed;with call bell/phone within reach;with family/visitor present;with SCD's reapplied Nurse Communication: Mobility status PT Visit Diagnosis: Unsteadiness on feet (R26.81);Other abnormalities of gait and mobility (R26.89);Muscle weakness (generalized) (M62.81);Difficulty in walking, not elsewhere classified (R26.2);Pain Pain - Right/Left: Left Pain - part of body: Hip;Knee     Time: 3299-2426 PT Time Calculation (min)  (ACUTE ONLY): 37 min  Charges:  $Gait Training: 8-22 mins $Therapeutic Exercise: 8-22 mins                     Vernell Morgans, SPT Acute Rehabilitation Services Office [phone]    Vernell Morgans 12/15/2017, 4:58 PM

## 2017-12-15 NOTE — Progress Notes (Signed)
Subjective: 1 Day Post-Op Procedure(s) (LRB): TOTAL HIP ARTHROPLASTY ANTERIOR APPROACH (Left) Patient reports pain as moderate.  Taking by mouth okay.  Foley catheter still in place.  No dizziness.  Not out of bed yet.  Objective: Vital signs in last 24 hours: Temp:  [97.2 F (36.2 C)-99.3 F (37.4 C)] 99.3 F (37.4 C) (12/10 0422) Pulse Rate:  [73-119] 119 (12/10 0918) Resp:  [14-20] 18 (12/10 0918) BP: (113-149)/(75-103) 135/88 (12/10 0422) SpO2:  [94 %-100 %] 97 % (12/10 0918) Weight:  [125.6 kg] 125.6 kg (12/09 1102)  Intake/Output from previous day: 12/09 0701 - 12/10 0700 In: 2885 [P.O.:320; I.V.:1715; IV Piggyback:850] Out: 4076 [Urine:2325; Blood:2300] Intake/Output this shift: No intake/output data recorded.  Recent Labs    12/14/17 1447 12/15/17 0241  HGB 10.2* 9.3*   Recent Labs    12/14/17 1447 12/15/17 0241  WBC  --  10.0  RBC  --  3.41*  HCT 30.0* 29.8*  PLT  --  222   Recent Labs    12/14/17 1447  NA 138  K 4.1  GLUCOSE 144*   Recent Labs    12/14/17 1058  INR 0.99   Left hip exam: Neurovascular intact Sensation intact distally Intact pulses distally Dorsiflexion/Plantar flexion intact Incision: dressing C/D/I No cellulitis present Compartment soft   Assessment/Plan: 1 Day Post-Op Procedure(s) (LRB): TOTAL HIP ARTHROPLASTY ANTERIOR APPROACH (Left)  Plan: Up with physical therapy weightbearing as tolerated on left without hip precautions. Resume preoperative Eliquis.  Continue with TED hose and SCDs.  Will plan on discharge home tomorrow.    Erlene Senters 12/15/2017, 9:35 AM

## 2017-12-15 NOTE — Evaluation (Signed)
Physical Therapy Evaluation Patient Details Name: [name] MRN: [id number] DOB: 1950/07/09 Today's Date: 12/15/2017   History of Present Illness  Pt is 67 y.o. male s/p left THA, direct anterior approach (12/14/17), secondary to osteoarthritis. PMH significant for bilateral TKA, DVT of left leg, HTN, CKD, GERD, chronic radicular low back pain, asthma, history of epilepsy.   Clinical Impression  PTA pt was ambulating with cane, completing ADL's independently with occasional help from his wife as needed for things such as tieing his shoes. Pt was MinA for bed mobility and sit to stands, and MinAx2 for stand pivot transfer for stability. Pt's treatment limited by medical complication as he was symptomatic with low BP upon standing (refer to General Comments below for vitals). Education provided on importance of continued mobility for optimal healing and prevention of DVT's. Skilled therapy necessary to address deficits in strength, ROM, endurance, and balance to return to PLOF. PT recommending HHPT at DC, pending improvements in vitals this afternoon, and safety with ambulation and stair navigation.  PT will continue to follow acutely.    Follow Up Recommendations Follow surgeon's recommendation for DC plan and follow-up therapies;Supervision - Intermittent    Equipment Recommendations  None recommended by PT       Precautions / Restrictions Precautions Precautions: Fall Restrictions Weight Bearing Restrictions: Yes LLE Weight Bearing: Weight bearing as tolerated      Mobility  Bed Mobility Overal bed mobility: Needs Assistance Bed Mobility: Supine to Sit     Supine to sit: Min assist;HOB elevated     General bed mobility comments: MinA for management of LLE off of bed, vc for sequencing and hand placement, use of bedrail to assist. Pt has good use of UE and trunk but requires increased time and effort, limited by pain.   Transfers Overall transfer level: Needs  assistance Equipment used: Rolling walker (2 wheeled) Transfers: Sit to/from [facility] Sit to Stand: Min assist;From elevated surface Stand pivot transfers: Min assist;+2 physical assistance;From elevated surface       General transfer comment: MinA for steadying as pt came to standing, vc for hand placement and foot placement for optimal power up. Pt is heavily reliant on UE to come to full upright. Each time after standing, pt reported increased dizziness and light headedness, returned to sitting and required ~5 minutes each time to decrease symptoms. MinAx2 for steadying during stand pivot transfer, vc for sequencing and management of RW.   Ambulation/Gait             General Gait Details: unable to attempt due to unstable vitals. Will reassess next visit.      Balance Overall balance assessment: Needs assistance Sitting-balance support: Bilateral upper extremity supported;Feet supported Sitting balance-Leahy Scale: Fair     Standing balance support: Bilateral upper extremity supported Standing balance-Leahy Scale: Poor Standing balance comment: pt heavily reliant on UE in standing                             Pertinent Vitals/Pain Pain Assessment: Faces Faces Pain Scale: Hurts even more Pain Location: left hip Pain Descriptors / Indicators: Grimacing;Guarding;Sore;Shooting;Tightness Pain Intervention(s): Limited activity within patient's tolerance;Monitored during session;Premedicated before session;Repositioned    Home Living Family/patient expects to be discharged to:: Private residence Living Arrangements: Spouse/significant other;Children Available Help at Discharge: Family;Available PRN/intermittently Type of Home: House Home Access: Stairs to enter Entrance Stairs-Rails: Left Entrance Stairs-Number of Steps: 3 Home Layout: One level Home  Equipment: Gilford Rile - 2 wheels;Cane - single point;Shower seat;Toilet riser;Grab bars -  toilet;Hand held shower head      Prior Function Level of Independence: Independent with assistive device(s)         Comments: ambulates with cane        Extremity/Trunk Assessment   Upper Extremity Assessment Upper Extremity Assessment: Overall WFL for tasks assessed    Lower Extremity Assessment Lower Extremity Assessment: LLE deficits/detail LLE Deficits / Details: s/p left THA (12/14/17)    Cervical / Trunk Assessment Cervical / Trunk Assessment: Normal  Communication   Communication: No difficulties  Cognition Arousal/Alertness: Awake/alert Behavior During Therapy: WFL for tasks assessed/performed Overall Cognitive Status: Within Functional Limits for tasks assessed                                        General Comments General comments (skin integrity, edema, etc.): Pt had increased dizziness and light headedness upon standing, with drops in BP (refer to chart below). Pt reports history of some dizziness with standing, has never been this severe. HR elevated to 130 bpm once during sit to stand, remainder of the time it remained in range of 110-128 bpm. Pt reported feeling better at end of treatment with chair reclined and feet elevated. SCD's reapplied, educated on importance of ankle pumps as well for prevention of DVT.    Orthostatic BPs Sitting after sit to stand 105/74  Sitting after stand pivot transfer to chair 77/58  Sitting after 5 minutes in chair 123/69     Exercises Total Joint Exercises Ankle Circles/Pumps: AROM;Both;10 reps;Seated   Assessment/Plan    PT Assessment Patient needs continued PT services  PT Problem List Decreased strength;Decreased range of motion;Decreased activity tolerance;Decreased balance;Decreased mobility;Decreased coordination;Decreased knowledge of use of DME;Pain       PT Treatment Interventions DME instruction;Gait training;Stair training;Functional mobility training;Therapeutic activities;Therapeutic  exercise;Balance training;Neuromuscular re-education;Patient/family education    PT Goals (Current goals can be found in the Care Plan section)  Acute Rehab PT Goals Patient Stated Goal: go home PT Goal Formulation: With patient Time For Goal Achievement: 12/29/17 Potential to Achieve Goals: Fair    Frequency 7X/week    AM-PAC PT "6 Clicks" Mobility  Outcome Measure Help needed turning from your back to your side while in a flat bed without using bedrails?: A Lot Help needed moving from lying on your back to sitting on the side of a flat bed without using bedrails?: A Lot Help needed moving to and from a bed to a chair (including a wheelchair)?: A Lot Help needed standing up from a chair using your arms (e.g., wheelchair or bedside chair)?: A Little Help needed to walk in hospital room?: A Lot Help needed climbing 3-5 steps with a railing? : A Lot 6 Click Score: 13    End of Session Equipment Utilized During Treatment: Gait belt Activity Tolerance: Treatment limited secondary to medical complications (Comment)(low BP upon standing) Patient left: in chair;with call bell/phone within reach;with chair alarm set;with SCD's reapplied Nurse Communication: Mobility status;Other (comment)(pt's vitals) PT Visit Diagnosis: Unsteadiness on feet (R26.81);Other abnormalities of gait and mobility (R26.89);Muscle weakness (generalized) (M62.81);Difficulty in walking, not elsewhere classified (R26.2);Pain Pain - Right/Left: Left Pain - part of body: Hip;Knee    Time: 3532-9924 PT Time Calculation (min) (ACUTE ONLY): 49 min   Charges:   PT Evaluation $PT Eval Moderate Complexity: 1 Mod PT Treatments $Therapeutic  Activity: 23-37 mins        Vernell Morgans, SPT Acute Rehabilitation Services Office [phone]   Vernell Morgans 12/15/2017, 11:58 AM

## 2017-12-15 NOTE — Discharge Instructions (Signed)

## 2017-12-16 LAB — CBC
HCT: 27.7 % — ABNORMAL LOW (ref 39.0–52.0)
Hemoglobin: 8.9 g/dL — ABNORMAL LOW (ref 13.0–17.0)
MCH: 27.5 pg (ref 26.0–34.0)
MCHC: 32.1 g/dL (ref 30.0–36.0)
MCV: 85.5 fL (ref 80.0–100.0)
Platelets: 198 10*3/uL (ref 150–400)
RBC: 3.24 MIL/uL — ABNORMAL LOW (ref 4.22–5.81)
RDW: 13.4 % (ref 11.5–15.5)
WBC: 12.9 10*3/uL — ABNORMAL HIGH (ref 4.0–10.5)
nRBC: 0 % (ref 0.0–0.2)

## 2017-12-16 LAB — GLUCOSE, CAPILLARY
Glucose-Capillary: 125 mg/dL — ABNORMAL HIGH (ref 70–99)
Glucose-Capillary: 81 mg/dL (ref 70–99)

## 2017-12-16 LAB — BASIC METABOLIC PANEL
ANION GAP: 11 (ref 5–15)
BUN: 33 mg/dL — ABNORMAL HIGH (ref 8–23)
CHLORIDE: 101 mmol/L (ref 98–111)
CO2: 22 mmol/L (ref 22–32)
Calcium: 8.5 mg/dL — ABNORMAL LOW (ref 8.9–10.3)
Creatinine, Ser: 1.46 mg/dL — ABNORMAL HIGH (ref 0.61–1.24)
GFR calc Af Amer: 57 mL/min — ABNORMAL LOW (ref >60)
GFR calc non Af Amer: 49 mL/min — ABNORMAL LOW (ref >60)
Glucose, Bld: 127 mg/dL — ABNORMAL HIGH (ref 70–99)
Potassium: 3.9 mmol/L (ref 3.5–5.1)
Sodium: 134 mmol/L — ABNORMAL LOW (ref 135–145)

## 2017-12-16 MED ORDER — SODIUM CHLORIDE 0.9 % IV BOLUS
1000.0000 mL | Freq: Once | INTRAVENOUS | Status: AC
  Administered 2017-12-16: 1000 mL via INTRAVENOUS

## 2017-12-16 NOTE — Progress Notes (Signed)
Paged Clair Gulling PA for positive orthostatics. Refer to flowsheet. Ordered 1L NS bolus. Last BMP was 2 days ago, so repeat BMP is ordered since pt is on [place]. Will reevaluate discharge tomorrow per PA.

## 2017-12-16 NOTE — Progress Notes (Signed)
Physical Therapy Treatment Patient Details Name: [name] MRN: [id number] DOB: 1950/07/23 Today's Date: 12/16/2017    History of Present Illness Pt is 67 y.o. male s/p left THA, direct anterior approach (12/14/17), secondary to osteoarthritis. PMH significant for bilateral TKA, DVT of left leg, HTN, CKD, GERD, chronic radicular low back pain, asthma, history of epilepsy.     PT Comments    Pt reclined in chair at start of treatment, reports he is not feeling well this afternoon. Orthostatic BP was measured with each positional change (refer to General Comments below). Treatment session and functional mobility limited due to symptomatic drops in BP with positional changes. Pt completed mobility with light MinA for management of RW during transfer and assisting LLE back into bed at end of session. DC plan remains appropriate pending stabilization of vitals for safety with mobility. PT will continue to follow acutely.   Follow Up Recommendations  Follow surgeon's recommendation for DC plan and follow-up therapies;Supervision - Intermittent     Equipment Recommendations  None recommended by PT       Precautions / Restrictions Precautions Precautions: Fall Restrictions Weight Bearing Restrictions: Yes LLE Weight Bearing: Weight bearing as tolerated    Mobility  Bed Mobility Overal bed mobility: Needs Assistance         Sit to supine: Min assist   General bed mobility comments: light MinA for management of LLE back into bed, pt used bed rails to assist himself.  Transfers Overall transfer level: Needs assistance Equipment used: Rolling walker (2 wheeled) Transfers: Sit to/from [facility] Sit to Stand: Min assist Stand pivot transfers: Min guard       General transfer comment: MinA for management of RW during power up, hands on min guard for stand pivot transfer to EOB.   Ambulation/Gait             General Gait Details: unable to  ambulate due to unstable vitals.      Balance Overall balance assessment: Needs assistance Sitting-balance support: Bilateral upper extremity supported;Feet supported Sitting balance-Leahy Scale: Fair     Standing balance support: Bilateral upper extremity supported Standing balance-Leahy Scale: Poor Standing balance comment: pt heavily reliant on UE in standing                            Cognition Arousal/Alertness: Awake/alert Behavior During Therapy: WFL for tasks assessed/performed Overall Cognitive Status: Within Functional Limits for tasks assessed                                        Exercises  Unable to perform due to unstable vitals and pt not feeling well. Pt reports he has been doing some exercises as able.     General Comments  Pt's vitals were measured in supine reclined in chair at start of treatment, in seated with back unsupported, in standing, in standing at EOB after completing stand pivot with RW, and once more in supine in bed. HR elevated from 108 bpm at rest to 135 bpm with mobility. HR returned to 113 bpm at end of session in bed.   Orthostatic BPs  Supine 132/84  Sitting 135/80  Standing 117/83  Standing after 5 min 127/101  Supine 140/80        Pertinent Vitals/Pain Pain Assessment: Faces Faces Pain Scale: Hurts even more Pain Location: left hip  Pain Descriptors / Indicators: Grimacing;Guarding;Sore;Shooting;Tightness Pain Intervention(s): Limited activity within patient's tolerance;Monitored during session;Repositioned           PT Goals (current goals can now be found in the care plan section) Acute Rehab PT Goals Patient Stated Goal: go home PT Goal Formulation: With patient Time For Goal Achievement: 12/29/17 Potential to Achieve Goals: Fair Progress towards PT goals: (P) Progressing toward goals(progression limited by unstable vitals )    Frequency    7X/week      PT Plan Current plan remains  appropriate;Other (comment)(pending management of symptomatic BP fluctuations.)       AM-PAC PT "6 Clicks" Mobility   Outcome Measure  Help needed turning from your back to your side while in a flat bed without using bedrails?: A Lot Help needed moving from lying on your back to sitting on the side of a flat bed without using bedrails?: A Little Help needed moving to and from a bed to a chair (including a wheelchair)?: A Little Help needed standing up from a chair using your arms (e.g., wheelchair or bedside chair)?: A Little Help needed to walk in hospital room?: A Little Help needed climbing 3-5 steps with a railing? : A Lot 6 Click Score: 16    End of Session Equipment Utilized During Treatment: Gait belt Activity Tolerance: (P) Treatment limited secondary to medical complications (Comment)(symptomatic low BP with positional change) Patient left: (P) with call bell/phone within reach;with SCD's reapplied;in bed Nurse Communication: (P) Mobility status;Other (comment)(orthostatic BP measurements) PT Visit Diagnosis: Unsteadiness on feet (R26.81);Other abnormalities of gait and mobility (R26.89);Muscle weakness (generalized) (M62.81);Difficulty in walking, not elsewhere classified (R26.2);Pain Pain - Right/Left: Left Pain - part of body: Hip;Knee     Time: (P) 1519-(P) 1536 PT Time Calculation (min) (ACUTE ONLY): (P) 17 min  Charges:  $Therapeutic Activity: 8-22 mins                     Vernell Morgans, SPT Acute Rehabilitation Services Office [phone]    Vernell Morgans 12/16/2017, 3:52 PM

## 2017-12-16 NOTE — Progress Notes (Signed)
Subjective: 2 Days Post-Op Procedure(s) (LRB): TOTAL HIP ARTHROPLASTY ANTERIOR APPROACH (Left) Patient reports pain as mild.  Complaints of some left knee pain.  No dizziness.  Progressing with physical therapy.  Objective: Vital signs in last 24 hours: Temp:  [98.4 F (36.9 C)-99.1 F (37.3 C)] 98.4 F (36.9 C) (12/11 0323) Pulse Rate:  [109-119] 119 (12/11 0911) Resp:  [16-18] 18 (12/11 0911) BP: (112-138)/(81-87) 138/81 (12/11 0323) SpO2:  [95 %-99 %] 95 % (12/11 0911)  Intake/Output from previous day: 12/10 0701 - 12/11 0700 In: 720 [P.O.:720] Out: 3075 [Urine:3075] Intake/Output this shift: Total I/O In: 240 [P.O.:240] Out: 425 [Urine:425]  Recent Labs    12/14/17 1447 12/15/17 0241 12/16/17 0250  HGB 10.2* 9.3* 8.9*   Recent Labs    12/15/17 0241 12/16/17 0250  WBC 10.0 12.9*  RBC 3.41* 3.24*  HCT 29.8* 27.7*  PLT 222 198   Recent Labs    12/14/17 1447  NA 138  K 4.1  GLUCOSE 144*   Recent Labs    12/14/17 1058  INR 0.99   Left hip exam: Neurovascular intact Sensation intact distally Intact pulses distally Dorsiflexion/Plantar flexion intact Incision: dressing C/D/I Compartment soft  Assessment/Plan: 2 Days Post-Op Procedure(s) (LRB): TOTAL HIP ARTHROPLASTY ANTERIOR APPROACH (Left)  Plan: Discharge home today with home health PT. Weight-bear as tolerated on left without hip precautions. Patient has resumed his preoperative Eliquis. Follow-up with Dr. Berenice Primas in 2 weeks.    Erlene Senters 12/16/2017, 9:51 AM

## 2017-12-16 NOTE — Discharge Summary (Addendum)
Patient ID: [name] MRN: [id number] DOB/AGE: 09-Jun-1950 67 y.o.  Admit date: 12/14/2017 Discharge date: 12/18/2017  Admission Diagnoses:  Principal Problem:   Primary osteoarthritis of left hip Active Problems:   Acute blood loss as cause of postoperative anemia   Discharge Diagnoses:  Same  Past Medical History:  Diagnosis Date  . Allergic rhinitis   . Anemia   . BPH (benign prostatic hyperplasia)   . Chronic kidney disease   . Chronic sinusitis   . Complication of anesthesia    work up during surgery 2x in the past   . Decreased libido   . DVT (deep venous thrombosis) (HCC)    left leg after left knee surgery  . ED (erectile dysfunction)   . Fatigue   . Hematuria   . HTN (hypertension)    history of  . Hypogonadism in male   . Hypokalemia    history of   . IBS (irritable bowel syndrome)   . Low serum vitamin D   . Lumbago   . Migraine   . Mild intermittent asthma   . Osteoarthritis of hip    left (bone-on-bone) Dr. Alta Corning & Alvina Filbert. Modena Slater, PA-C  . Pneumonia    walking pneumonia  . Reflux   . Shingles   . Unilateral inguinal hernia without obstruction or gangrene     Surgeries: Procedure(s): Left TOTAL HIP ARTHROPLASTY ANTERIOR APPROACH on 12/14/2017   Discharged Condition: Improved  Hospital Course: [name] is an 67 y.o. male who was admitted 12/14/2017 for operative treatment ofPrimary osteoarthritis of left hip. Patient has severe unremitting pain that affects sleep, daily activities, and work/hobbies. After pre-op clearance the patient was taken to the operating room on 12/14/2017 and underwent  Procedure(s): Left [place].    Patient was given perioperative antibiotics:  Anti-infectives (From admission, onward)   Start     Dose/Rate Route Frequency Ordered Stop   12/14/17 1800  ceFAZolin (ANCEF) IVPB 2g/100 mL premix     2 g 200 mL/hr over 30 Minutes Intravenous Every 6 hours  12/14/17 1710 12/15/17 0002   12/14/17 0600  ceFAZolin (ANCEF) 3 g in dextrose 5 % 50 mL IVPB     3 g 100 mL/hr over 30 Minutes Intravenous  Once 12/11/17 0842 12/14/17 1323       Patient was given sequential compression devices, early ambulation, and chemoprophylaxis to prevent DVT.  He was resumed on his preop Eliquis postoperatively.  The patient had symptomatic orthostatic hypotension and was anemic.  He had symptomatic anemia.  2 units of packed RBCs were transfused on 12/17/2017.  On 12/18/2017 the day of discharge patient was much more energetic and stated he felt much better.  He no longer had any dizziness.  Patient benefited maximally from hospital stay and there were no complications.    Recent vital signs:  Patient Vitals for the past 24 hrs:  BP Temp Temp src Pulse Resp SpO2  12/18/17 0300 122/86 98.8 F (37.1 C) Oral 92 18 98 %  12/17/17 2135 118/84 99.2 F (37.3 C) Oral 98 20 99 %  12/17/17 1805 (!) 145/97 98.8 F (37.1 C) Oral (!) 103 18 98 %  12/17/17 1750 (!) 148/86 98.6 F (37 C) Oral 90 18 95 %  12/17/17 1539 114/81 98.2 F (36.8 C) Oral 92 - 98 %  12/17/17 1439 113/75 98.2 F (36.8 C) Oral 94 - 98 %  12/17/17 1420 112/78 98.9 F (37.2 C) Oral  98 - 98 %     Recent laboratory studies:  Recent Labs    12/16/17 0250 12/16/17 1358 12/17/17 0220 12/18/17 0130  WBC 12.9*  --  12.8*  --   HGB 8.9*  --  7.9* 9.0*  HCT 27.7*  --  24.7* 28.5*  PLT 198  --  198  --   NA  --  134*  --   --   K  --  3.9  --   --   CL  --  101  --   --   CO2  --  22  --   --   BUN  --  33*  --   --   CREATININE  --  1.46*  --   --   GLUCOSE  --  127*  --   --   CALCIUM  --  8.5*  --   --      Discharge Medications:   Allergies as of 12/18/2017      Reactions   Almond (diagnostic) Other (See Comments)   Migraines   Lactose Intolerance (gi) Other (See Comments)   MIGRAINES   Peanut-containing Drug Products Other (See Comments)   Migraines   Shellfish Allergy Other (See  Comments)   Congestion/breathing problems/migraines.      Medication List    TAKE these medications   albuterol 108 (90 Base) MCG/ACT inhaler Commonly known as:  PROAIR HFA Inhale 2 puffs into the lungs every 6 (six) hours as needed for wheezing or shortness of breath.   apixaban 5 MG Tabs tablet Commonly known as:  ELIQUIS Take 1 tablet (5 mg total) by mouth 2 (two) times daily.   azelastine 0.05 % ophthalmic solution Commonly known as:  OPTIVAR INSTILL 2 DROPS INTO BOTH EYES TWICE A DAY What changed:  See the new instructions.   BREO ELLIPTA 200-25 MCG/INH Aepb Generic drug:  fluticasone furoate-vilanterol Inhale 1 puff into the lungs daily.   cholecalciferol 25 MCG (1000 UT) tablet Commonly known as:  VITAMIN D3 Take 1,000 Units by mouth daily.   CULTURELLE Caps Take 1 capsule by mouth daily.   docusate sodium 100 MG capsule Commonly known as:  COLACE Take 1 capsule (100 mg total) by mouth 2 (two) times daily. What changed:    when to take this  reasons to take this   docusate sodium 100 MG capsule Commonly known as:  COLACE Take 1 capsule (100 mg total) by mouth 2 (two) times daily. What changed:  You were already taking a medication with the same name, and this prescription was added. Make sure you understand how and when to take each.   fexofenadine 180 MG tablet Commonly known as:  ALLEGRA Take 180 mg by mouth daily as needed for allergies or rhinitis.   fexofenadine-pseudoephedrine 180-240 MG 24 hr tablet Commonly known as:  ALLEGRA-D 24 Take 1 tablet by mouth daily as needed (for allergies.).   fluticasone 50 MCG/ACT nasal spray Commonly known as:  FLONASE Place 2 sprays into both nostrils daily. What changed:    when to take this  reasons to take this   JOINT HEALTH PO Take 1 tablet by mouth daily. INSTAFLEX ADVANCED JOINT SUPPORT   montelukast 10 MG tablet Commonly known as:  SINGULAIR Take 1 tablet (10 mg total) by mouth daily.    MULTIVITAMIN ADULTS 50+ PO Take 1 tablet by mouth daily. NATURE'S CODE MEN OVER 50 MULTIVITAMIN PACK   NF FORMULAS TESTOSTERONE PO Take 2 tablets by  mouth daily. NATURE'S PLUS T-MALE SUPPLEMENT   GINSENG COMPLEX PO Take 1 tablet by mouth daily.   OVER THE COUNTER MEDICATION Take 1 tablet by mouth 3 (three) times daily. TEST-HD TESTOSTERONE SUPPORT   oxyCODONE-acetaminophen 5-325 MG tablet Commonly known as:  PERCOCET/ROXICET Take 1-2 tablets by mouth every 6 (six) hours as needed for severe pain.   SYSTANE ULTRA 0.4-0.3 % Soln Generic drug:  Polyethyl Glycol-Propyl Glycol Place 1-2 drops into both eyes 3 (three) times daily as needed (for dry/irritated eyes.).   tadalafil 5 MG tablet Commonly known as:  CIALIS Take 1 tablet (5 mg total) by mouth daily as needed for erectile dysfunction. What changed:  when to take this   tamsulosin 0.4 MG Caps capsule Commonly known as:  FLOMAX Take 1 capsule (0.4 mg total) by mouth every evening.   tiZANidine 2 MG tablet Commonly known as:  ZANAFLEX Take 1 tablet (2 mg total) by mouth 3 (three) times daily.   traMADol 50 MG tablet Commonly known as:  ULTRAM Take 50 mg by mouth every 6 (six) hours as needed for moderate pain.   VELTASSA 8.4 g packet Generic drug:  patiromer Take 8.4 g by mouth daily.   vitamin B-12 500 MCG tablet Commonly known as:  CYANOCOBALAMIN Take 500 mcg by mouth daily.            Discharge Care Instructions  (From admission, onward)         Start     Ordered   12/18/17 0000  Weight bearing as tolerated    Question Answer Comment  Laterality left   Extremity Lower      12/18/17 0919          Diagnostic Studies: Dg Chest 2 View  Result Date: 12/02/2017 CLINICAL DATA:  Preop. EXAM: CHEST - 2 VIEW COMPARISON:  07/02/2017. FINDINGS: Trachea is midline. Heart size normal. Descending thoracic aorta is tortuous. Lungs are clear. No pleural fluid. IMPRESSION: No acute findings. Electronically  Signed   By: Lorin Picket M.D.   On: 12/02/2017 16:06   Dg C-arm 1-60 Min  Result Date: 12/14/2017 CLINICAL DATA:  Status post anterior approach left total hip joint prosthesis placement. EXAM: OPERATIVE left HIP (WITH PELVIS IF PERFORMED) 2 VIEWS TECHNIQUE: Fluoroscopic spot image(s) were submitted for interpretation post-operatively. COMPARISON:  None. FINDINGS: Reported fluoro time is 30 seconds. The patient has undergone left total hip joint prosthesis placement. Radiographic positioning of the prosthetic components is good. The interface of the prosthetic components with the native bone is normal. IMPRESSION: No immediate complication following left total hip joint prosthesis placement using the anterior approach. Electronically Signed   By: David  Martinique M.D.   On: 12/14/2017 15:19   Dg C-arm 1-60 Min  Result Date: 12/14/2017 CLINICAL DATA:  Status post anterior approach left total hip joint prosthesis placement. EXAM: OPERATIVE left HIP (WITH PELVIS IF PERFORMED) 2 VIEWS TECHNIQUE: Fluoroscopic spot image(s) were submitted for interpretation post-operatively. COMPARISON:  None. FINDINGS: Reported fluoro time is 30 seconds. The patient has undergone left total hip joint prosthesis placement. Radiographic positioning of the prosthetic components is good. The interface of the prosthetic components with the native bone is normal. IMPRESSION: No immediate complication following left total hip joint prosthesis placement using the anterior approach. Electronically Signed   By: David  Martinique M.D.   On: 12/14/2017 15:19   Dg Hip Operative Unilat W Or W/o Pelvis Left  Result Date: 12/14/2017 CLINICAL DATA:  Status post anterior approach left total hip  joint prosthesis placement. EXAM: OPERATIVE left HIP (WITH PELVIS IF PERFORMED) 2 VIEWS TECHNIQUE: Fluoroscopic spot image(s) were submitted for interpretation post-operatively. COMPARISON:  None. FINDINGS: Reported fluoro time is 30 seconds. The patient  has undergone left total hip joint prosthesis placement. Radiographic positioning of the prosthetic components is good. The interface of the prosthetic components with the native bone is normal. IMPRESSION: No immediate complication following left total hip joint prosthesis placement using the anterior approach. Electronically Signed   By: David  Martinique M.D.   On: 12/14/2017 15:19    Disposition: Discharge disposition: 01-Home or Self Care       Discharge Instructions    Call MD / Call 911   Complete by:  As directed    If you experience chest pain or shortness of breath, CALL 911 and be transported to the hospital emergency room.  If you develope a fever above 101 F, pus (white drainage) or increased drainage or redness at the wound, or calf pain, call your surgeon's office.   Call MD / Call 911   Complete by:  As directed    If you experience chest pain or shortness of breath, CALL 911 and be transported to the hospital emergency room.  If you develope a fever above 101 F, pus (white drainage) or increased drainage or redness at the wound, or calf pain, call your surgeon's office.   Diet general   Complete by:  As directed    Increase activity slowly as tolerated   Complete by:  As directed    Increase activity slowly as tolerated   Complete by:  As directed    Weight bearing as tolerated   Complete by:  As directed    Laterality:  left   Extremity:  Lower      Follow-up Information    Dorna Leitz, MD. Go on 12/28/2017.   Specialty:  Orthopedic Surgery Why:  Your appointment has been set for 1000 Contact information: [place] [address] [phone]        [facility] Follow up.   Specialty:  [place] Why:  A representative from [facility] will contact you to arrange start date and time for your therapy. Contact information: [address] [place] [address] [phone]        [facility], [place]. Go on 12/28/2017.   Why:  Your outpatient physical therapy appointment has been set for 12/28/17 @ 200. Please arrive by 140 to complet your paperwork.  Contact information: [facility] [place] [address] [phone]            Signed: Erlene Senters 12/18/2017, 9:24 AM

## 2017-12-16 NOTE — Care Plan (Signed)
Ortho Bundle Case Management Note  Patient Details  Name: [name] MRN: [id number] Date of Birth: Mar 04, 1950  Patient has been slow to respond to therapy due to dizziness and low blood pressure. Will change discharge plan to add HHPT with [facility] and move OPPT appointment to 12/28/17. Will communicate this with wife and patient. Has used [facility] prior with no complaints.                DME Arranged:    DME Agency:     HH Arranged:  PT HH Agency:  [facility] (formerly [facility])  Additional Comments: Please contact me with any questions of if this plan should need to change.  Ladell Heads,  [place] [facility]  [phone] 12/16/2017, 1:11 PM

## 2017-12-16 NOTE — Progress Notes (Signed)
Physical Therapy Treatment Patient Details Name: [name] MRN: [id number] DOB: Feb 24, 1950 Today's Date: 12/16/2017    History of Present Illness Pt is 67 y.o. male s/p left THA, direct anterior approach (12/14/17), secondary to osteoarthritis. PMH significant for bilateral TKA, DVT of left leg, HTN, CKD, GERD, chronic radicular low back pain, asthma, history of epilepsy.     PT Comments    Pt up in chair at start of treatment, agreeable to therapy. Treatment limited by complications with symptomatic drop in BP. Initiated stair training today, pt able to demonstrate proper sequencing with hands on min guard initially. Progressed to [place] as he reported increased dizziness with ascending steps, and severity increased as he was descending steps. MaxAx2 stand pivot transfer with use of RW to chair, where pt was reclined and vitals were checked (refer to General Comments below). Nurse was notified of vitals and mobility, DC plan remains appropriate pending pt's improvement with management of BP. Currently unable to progress mobility, limited by pain and unstable vitals. PT will continue to follow acutely.   Follow Up Recommendations  Follow surgeon's recommendation for DC plan and follow-up therapies;Supervision - Intermittent     Equipment Recommendations  None recommended by PT       Precautions / Restrictions Precautions Precautions: Fall Restrictions Weight Bearing Restrictions: Yes LLE Weight Bearing: Weight bearing as tolerated    Mobility  Bed Mobility               General bed mobility comments: pt up in chair at start of treatment  Transfers Overall transfer level: Needs assistance Equipment used: Rolling walker (2 wheeled) Transfers: Sit to/from [facility] Sit to Stand: Min guard Stand pivot transfers: Max assist;+2 physical assistance       General transfer comment: hands on min guard for sit to stand from chair, MaxAx2 for stand  pivot transfer with use of RW after onset of severe dizziness with stair training. Pt denies dizziness upon first sit to stand from chair.   Ambulation/Gait Ambulation/Gait assistance: Min guard Gait Distance (Feet): 2 Feet Assistive device: Rolling walker (2 wheeled) Gait Pattern/deviations: Step-to pattern;Decreased stance time - left;Decreased stride length;Decreased weight shift to left   Gait velocity interpretation: <1.8 ft/sec, indicate of risk for recurrent falls General Gait Details: hands on min guard for safety, pt ambulated short distance from chair to stairs.    Stairs Stairs: Yes Stairs assistance: Min assist;Max assist;+2 physical assistance Stair Management: Two rails;Step to pattern Number of Stairs: 2 General stair comments: Pt ambulated stairs with heavy reliance on UE, decreased weight shift to LLE. Pt reports onset of dizziness after ascending second step, reports worsening dizziness but able to descend steps with proper form. vc for regular breathing, sequencing and hand placement on rail.       Balance Overall balance assessment: Needs assistance Sitting-balance support: Bilateral upper extremity supported;Feet supported Sitting balance-Leahy Scale: Fair     Standing balance support: Bilateral upper extremity supported Standing balance-Leahy Scale: Poor Standing balance comment: pt heavily reliant on UE in standing                            Cognition Arousal/Alertness: Awake/alert Behavior During Therapy: WFL for tasks assessed/performed Overall Cognitive Status: Within Functional Limits for tasks assessed  General Comments General comments (skin integrity, edema, etc.): (P) Pt is symptomatic of low BP after stair navigation in rehab gym, reporting severe dizziness. He was returned to chair and reclined with feet elevated. BP monitor was retrieved and BP was measured to be 128/72 after  pt had been sitting reclined for ~3 minutes. BP was taken again 5 minutes later at 124/88. Nurse notified, contacting physician about BP fluctuations with mobility.       Pertinent Vitals/Pain Pain Assessment: Faces Faces Pain Scale: Hurts whole lot Pain Location: left hip Pain Descriptors / Indicators: Grimacing;Guarding;Sore;Shooting;Tightness Pain Intervention(s): Limited activity within patient's tolerance;Monitored during session;Repositioned           PT Goals (current goals can now be found in the care plan section) Acute Rehab PT Goals Patient Stated Goal: go home PT Goal Formulation: With patient Time For Goal Achievement: 12/29/17 Potential to Achieve Goals: Fair Progress towards PT goals: Progressing toward goals    Frequency    7X/week      PT Plan Current plan remains appropriate;Other (comment)(pending management of symptomatic BP fluctuations.)       AM-PAC PT "6 Clicks" Mobility   Outcome Measure  Help needed turning from your back to your side while in a flat bed without using bedrails?: A Lot Help needed moving from lying on your back to sitting on the side of a flat bed without using bedrails?: A Little Help needed moving to and from a bed to a chair (including a wheelchair)?: A Little Help needed standing up from a chair using your arms (e.g., wheelchair or bedside chair)?: A Little Help needed to walk in hospital room?: A Little Help needed climbing 3-5 steps with a railing? : A Lot 6 Click Score: 16    End of Session Equipment Utilized During Treatment: Gait belt Activity Tolerance: Treatment limited secondary to medical complications (Comment)(symptomatic low BP with stair navigation) Patient left: with call bell/phone within reach;in chair;with SCD's reapplied Nurse Communication: Mobility status PT Visit Diagnosis: Unsteadiness on feet (R26.81);Other abnormalities of gait and mobility (R26.89);Muscle weakness (generalized) (M62.81);Difficulty  in walking, not elsewhere classified (R26.2);Pain Pain - Right/Left: Left Pain - part of body: Hip;Knee     Time: 0254-2706 PT Time Calculation (min) (ACUTE ONLY): 30 min  Charges:  $Gait Training: 8-22 mins $Therapeutic Activity: 8-22 mins                     Vernell Morgans, SPT Acute Rehabilitation Services Office [phone]    Vernell Morgans 12/16/2017, 12:22 PM

## 2017-12-17 LAB — CBC
HCT: 24.7 % — ABNORMAL LOW (ref 39.0–52.0)
Hemoglobin: 7.9 g/dL — ABNORMAL LOW (ref 13.0–17.0)
MCH: 27.5 pg (ref 26.0–34.0)
MCHC: 32 g/dL (ref 30.0–36.0)
MCV: 86.1 fL (ref 80.0–100.0)
Platelets: 198 10*3/uL (ref 150–400)
RBC: 2.87 MIL/uL — AB (ref 4.22–5.81)
RDW: 13.3 % (ref 11.5–15.5)
WBC: 12.8 10*3/uL — ABNORMAL HIGH (ref 4.0–10.5)
nRBC: 0 % (ref 0.0–0.2)

## 2017-12-17 LAB — PREPARE RBC (CROSSMATCH)

## 2017-12-17 MED ORDER — SODIUM CHLORIDE 0.9% IV SOLUTION
Freq: Once | INTRAVENOUS | Status: AC
  Administered 2017-12-17: 12:00:00 via INTRAVENOUS

## 2017-12-17 MED ORDER — FUROSEMIDE 10 MG/ML IJ SOLN
20.0000 mg | Freq: Once | INTRAMUSCULAR | Status: AC
  Administered 2017-12-17: 20 mg via INTRAVENOUS
  Filled 2017-12-17: qty 2

## 2017-12-17 NOTE — Plan of Care (Signed)
  Problem: Education: Goal: Knowledge of General Education information will improve Description Including pain rating scale, medication(s)/side effects and non-pharmacologic comfort measures Outcome: Progressing   Problem: Health Behavior/Discharge Planning: Goal: Ability to manage health-related needs will improve Outcome: Progressing   Problem: Clinical Measurements: Goal: Ability to maintain clinical measurements within normal limits will improve Outcome: Progressing Goal: Will remain free from infection Outcome: Progressing Goal: Diagnostic test results will improve Outcome: Progressing Goal: Respiratory complications will improve Outcome: Progressing Goal: Cardiovascular complication will be avoided Outcome: Progressing   Problem: Activity: Goal: Risk for activity intolerance will decrease Outcome: Progressing   Problem: Nutrition: Goal: Adequate nutrition will be maintained Outcome: Progressing   Problem: Coping: Goal: Level of anxiety will decrease Outcome: Progressing   Problem: Elimination: Goal: Will not experience complications related to bowel motility Outcome: Progressing Goal: Will not experience complications related to urinary retention Outcome: Progressing   Problem: Pain Managment: Goal: General experience of comfort will improve Outcome: Progressing   Problem: Safety: Goal: Ability to remain free from injury will improve Outcome: Progressing   Problem: Skin Integrity: Goal: Risk for impaired skin integrity will decrease Outcome: Progressing   Problem: Education: Goal: Knowledge of the prescribed therapeutic regimen will improve Outcome: Progressing Goal: Understanding of discharge needs will improve Outcome: Progressing Goal: Individualized Educational Video(s) Outcome: Progressing   Problem: Clinical Measurements: Goal: Postoperative complications will be avoided or minimized Outcome: Progressing

## 2017-12-17 NOTE — Progress Notes (Signed)
Physical Therapy Treatment Patient Details Name: [name] MRN: [id number] DOB: August 16, 1950 Today's Date: 12/17/2017    History of Present Illness Pt is 67 y.o. male s/p left THA, direct anterior approach (12/14/17), secondary to osteoarthritis. PMH significant for bilateral TKA, DVT of left leg, HTN, CKD, GERD, chronic radicular low back pain, asthma, history of epilepsy.     PT Comments    Pt receiving blood supine in bed at start of treatment, limited treatment to exercises in bed. Pt educated on importance of continued mobility throughout the day and pain management. Reviewed supine HEP and educated on proper form to return to normal motor planning strategies for movement of LLE. DC plan remains appropriate pending pt's ability to safely ambulate tomorrow. PT will continue to follow acutely.   Follow Up Recommendations  Home health PT;Other (comment);Supervision - Intermittent(HHRN)     Equipment Recommendations  None recommended by PT       Precautions / Restrictions Precautions Precautions: Fall Restrictions Weight Bearing Restrictions: Yes LLE Weight Bearing: Weight bearing as tolerated    Mobility  Bed Mobility Overal bed mobility: Needs Assistance Bed Mobility: (repositioning and scooting in bed)       Sit to supine: Min guard   General bed mobility comments: min guard for safety, pt able to reposition and scoot himself up in the bed with vc, no assistance needed.      Balance Overall balance assessment: Needs assistance Sitting-balance support: Bilateral upper extremity supported;Feet supported Sitting balance-Leahy Scale: Fair     Standing balance support: Bilateral upper extremity supported Standing balance-Leahy Scale: Fair                              Cognition Arousal/Alertness: Awake/alert Behavior During Therapy: WFL for tasks assessed/performed Overall Cognitive Status: Within Functional Limits for tasks assessed                                         Exercises Total Joint Exercises Ankle Circles/Pumps: AROM;Both;10 reps;Supine Quad Sets: AROM;Both;5 reps;Supine Heel Slides: AROM;Left;5 reps;Supine Hip ABduction/ADduction: AAROM;Left;5 reps;Supine Straight Leg Raises: AAROM;Left;5 reps;Supine PROM performed on LLE to assess available ROM, pt requires moderate verbal and tactile cues to relax, still guards and blocks PT from moving hip and knee into flexion passed ~60 deg (grossly assessed, not measured).    General Comments General comments (skin integrity, edema, etc.): Pt receiving blood transfusion at start of treatment, limited treatment to bed exercises and working on hip and knee ROM of LLE.       Pertinent Vitals/Pain Pain Assessment: Faces Faces Pain Scale: Hurts little more Pain Location: left knee>left hip Pain Descriptors / Indicators: Grimacing;Guarding;Sore;Shooting Pain Intervention(s): Limited activity within patient's tolerance;Monitored during session           PT Goals (current goals can now be found in the care plan section) Acute Rehab PT Goals Patient Stated Goal: go home PT Goal Formulation: With patient Time For Goal Achievement: 12/29/17 Potential to Achieve Goals: Fair Progress towards PT goals: Progressing toward goals    Frequency    7X/week      PT Plan Current plan remains appropriate       AM-PAC PT "6 Clicks" Mobility   Outcome Measure  Help needed turning from your back to your side while in a flat bed without using bedrails?: A Lot Help  needed moving from lying on your back to sitting on the side of a flat bed without using bedrails?: A Little Help needed moving to and from a bed to a chair (including a wheelchair)?: A Little Help needed standing up from a chair using your arms (e.g., wheelchair or bedside chair)?: A Little Help needed to walk in hospital room?: Total Help needed climbing 3-5 steps with a railing? : Total 6 Click  Score: 13    End of Session Equipment Utilized During Treatment: Gait belt Activity Tolerance: Patient tolerated treatment well;Other (comment)(Pt receiving blood ) Patient left: in bed;with call bell/phone within reach   PT Visit Diagnosis: Unsteadiness on feet (R26.81);Other abnormalities of gait and mobility (R26.89);Muscle weakness (generalized) (M62.81);Difficulty in walking, not elsewhere classified (R26.2);Pain Pain - Right/Left: Left Pain - part of body: Hip;Knee     Time: 2233-6122 PT Time Calculation (min) (ACUTE ONLY): 23 min  Charges:  $Therapeutic Exercise: 23-37 mins                     Vernell Morgans, SPT Acute Rehabilitation Services Office [phone]    Vernell Morgans 12/17/2017, 5:34 PM

## 2017-12-17 NOTE — Progress Notes (Signed)
Pt educated on orders for 2 units of RBCs to be given... pt verbalizes understanding.

## 2017-12-17 NOTE — Progress Notes (Signed)
Physical Therapy Treatment Patient Details Name: [name] MRN: [id number] DOB: 03-27-1950 Today's Date: 12/17/2017    History of Present Illness Pt is 67 y.o. male s/p left THA, direct anterior approach (12/14/17), secondary to osteoarthritis. PMH significant for bilateral TKA, DVT of left leg, HTN, CKD, GERD, chronic radicular low back pain, asthma, history of epilepsy.     PT Comments    Pt agreeable to therapy, treatment remains limited due to fluctuations in BP with positional changes (refer to General Comments below). Pt completed bed mobility min guard with increased time and effort, no reports of dizziness upon sitting up. Pt symptomatic upon standing, able to complete 5 sit to stands and some exercises, intermittent seated rest breaks taken as needed to manage dizziness and pain. He has increased pain with exercises, with emphasis being in the left knee during knee flexion activities. Pt became tearful during therapy, reporting concern with getting better and decreased ability to manage his pain independently. Spoke with nurse to provide additional education on pain management. PT recommending additional nursing assistance upon DC home to monitor vitals and maintain safety while pt increases mobility. PT will continue to treat acutely to progress mobility as able.  Follow Up Recommendations  Home health PT;Other [facility]);Supervision - Intermittent     Equipment Recommendations  None recommended by PT       Precautions / Restrictions Precautions Precautions: Fall Restrictions Weight Bearing Restrictions: Yes LLE Weight Bearing: Weight bearing as tolerated    Mobility  Bed Mobility Overal bed mobility: Needs Assistance         Sit to supine: Min guard   General bed mobility comments: min guard for safety, pt able to come to sitting on EOB without assistance, used RLE and UE to help assist LLE off of the bed, heavy use of bed rails, vc for  breathing.  Transfers Overall transfer level: Needs assistance Equipment used: Rolling walker (2 wheeled) Transfers: Sit to/from Stand Sit to Stand: Min assist         General transfer comment: MinA for management of RW during power up. Pt completed 5 sit to stands from EOB with use of RW, improved power up and stability compared to yesterday, patient is still symptomatic with BP fluctuations during positional changes.    Ambulation/Gait             General Gait Details: unable to ambulate due to unstable vitals.      Balance Overall balance assessment: Needs assistance Sitting-balance support: Bilateral upper extremity supported;Feet supported Sitting balance-Leahy Scale: Fair     Standing balance support: Bilateral upper extremity supported Standing balance-Leahy Scale: Fair Standing balance comment: pt able to perform standing marches with UE support, able to let go of RW for brief periods. Balance limited by symptomatic drops in BP upon standing.                            Cognition Arousal/Alertness: Awake/alert Behavior During Therapy: WFL for tasks assessed/performed Overall Cognitive Status: Within Functional Limits for tasks assessed                                        Exercises Total Joint Exercises Hip ABduction/ADduction: AROM;Left;10 reps;Standing Marching in Standing: AROM;Both;15 reps;Standing  Increases in left knee pain noted with knee flexion during standing marches. Less pain with hip abduction at knee  was kept extended.     General Comments General comments (skin integrity, edema, etc.): Pt continues to be symptomatic with positional changes, reporting dizziness upon standing and drops in BP.  BP fluctuated from 134/89 - 103/80 during sit to stands at EOB. HR ranged from 105 - 130 bpm during mobility.       Pertinent Vitals/Pain Pain Assessment: 0-10 Pain Score: 8  Pain Location: left knee>left hip Pain  Descriptors / Indicators: Grimacing;Guarding;Sore;Shooting Pain Intervention(s): Limited activity within patient's tolerance;Monitored during session;Repositioned           PT Goals (current goals can now be found in the care plan section) Acute Rehab PT Goals Patient Stated Goal: go home PT Goal Formulation: With patient Time For Goal Achievement: 12/29/17 Potential to Achieve Goals: Fair Progress towards PT goals: Progressing toward goals(progression limited due to unstable vitals)    Frequency    7X/week      PT Plan Discharge plan needs to be updated       AM-PAC PT "6 Clicks" Mobility   Outcome Measure  Help needed turning from your back to your side while in a flat bed without using bedrails?: A Lot Help needed moving from lying on your back to sitting on the side of a flat bed without using bedrails?: A Little Help needed moving to and from a bed to a chair (including a wheelchair)?: A Little Help needed standing up from a chair using your arms (e.g., wheelchair or bedside chair)?: A Little Help needed to walk in hospital room?: A Little Help needed climbing 3-5 steps with a railing? : A Lot 6 Click Score: 16    End of Session Equipment Utilized During Treatment: Gait belt Activity Tolerance: Treatment limited secondary to medical complications (Comment)(symptomatic low BP with positional change) Patient left: in bed;with nursing/sitter in room;Other (comment)(sitting EOB with nurse tech in room preparing bath) Nurse Communication: Mobility status;Other (comment)(orthostatic BP, need for consult with PA regarding tx plan) PT Visit Diagnosis: Unsteadiness on feet (R26.81);Other abnormalities of gait and mobility (R26.89);Muscle weakness (generalized) (M62.81);Difficulty in walking, not elsewhere classified (R26.2);Pain Pain - Right/Left: Left Pain - part of body: Hip;Knee     Time: 1005-1037 PT Time Calculation (min) (ACUTE ONLY): 32 min  Charges:   $Therapeutic Exercise: 8-22 mins $Therapeutic Activity: 8-22 mins                     Vernell Morgans, SPT Acute Rehabilitation Services Office [phone]    Vernell Morgans 12/17/2017, 11:56 AM

## 2017-12-17 NOTE — Progress Notes (Signed)
Pt alert and oriented x4, no complaints of pain or discomfort.  Bed in low position, call bell within reach.  Bed alarms on and functioning.  Assessment done and charted.  Will continue to monitor and do hourly rounding throughout the shift 

## 2017-12-17 NOTE — Progress Notes (Signed)
Subjective: 3 Days Post-Op Procedure(s) (LRB): TOTAL HIP ARTHROPLASTY ANTERIOR APPROACH (Left) Patient reports pain as mild.  Patient does complain of some dizziness when standing.  Objective: Vital signs in last 24 hours: Temp:  [98.9 F (37.2 C)-99.7 F (37.6 C)] 98.9 F (37.2 C) (12/12 0427) Pulse Rate:  [92-150] 92 (12/12 0427) Resp:  [18-19] 18 (12/12 0427) BP: (88-131)/(66-77) 130/72 (12/12 0427) SpO2:  [95 %-100 %] 98 % (12/12 0427)  Intake/Output from previous day: 12/11 0701 - 12/12 0700 In: 1680 [P.O.:680; IV Piggyback:1000] Out: 1925 [Urine:1925] Intake/Output this shift: No intake/output data recorded.  Recent Labs    12/14/17 1447 12/15/17 0241 12/16/17 0250 12/17/17 0220  HGB 10.2* 9.3* 8.9* 7.9*   Recent Labs    12/16/17 0250 12/17/17 0220  WBC 12.9* 12.8*  RBC 3.24* 2.87*  HCT 27.7* 24.7*  PLT 198 198   Recent Labs    12/14/17 1447 12/16/17 1358  NA 138 134*  K 4.1 3.9  CL  --  101  CO2  --  22  BUN  --  33*  CREATININE  --  1.46*  GLUCOSE 144* 127*  CALCIUM  --  8.5*   Recent Labs    12/14/17 1058  INR 0.99    Neurologically intact ABD soft Neurovascular intact Sensation intact distally Intact pulses distally No cellulitis present Compartment soft    Assessment/Plan: 3 Days Post-Op Procedure(s) (LRB): TOTAL HIP ARTHROPLASTY ANTERIOR APPROACH (Left) Advance diet Up with therapy  I will have a discussion with his hematology oncology physician today regarding whether he should have transfusion while in the hospital given his inability to make blood cells and history of anemia.  Hopefully he will not be dizzy when getting up today and will be able to be discharged home.  If he continues to be dizzy we will have to make some determination of whether he will need blood transfusion or additional help upon discharge from the hospital.    Alta Corning 12/17/2017, 8:26 AM

## 2017-12-17 NOTE — Care Management Important Message (Signed)
Important Message  Patient Details  Name: [name] MRN: [id number] Date of Birth: 11-05-1950   Medicare Important Message Given:       Orbie Pyo 12/17/2017, 2:48 PM

## 2017-12-18 ENCOUNTER — Other Ambulatory Visit: Payer: Self-pay | Admitting: Family Medicine

## 2017-12-18 DIAGNOSIS — D62 Acute posthemorrhagic anemia: Secondary | ICD-10-CM

## 2017-12-18 LAB — TYPE AND SCREEN
ABO/RH(D): O POS
Antibody Screen: NEGATIVE
Unit division: 0
Unit division: 0

## 2017-12-18 LAB — BPAM RBC
BLOOD PRODUCT EXPIRATION DATE: 202001102359
Blood Product Expiration Date: 202001102359
ISSUE DATE / TIME: 201912121418
ISSUE DATE / TIME: 201912121818
Unit Type and Rh: 5100
Unit Type and Rh: 5100

## 2017-12-18 LAB — HEMOGLOBIN AND HEMATOCRIT, BLOOD
HCT: 28.5 % — ABNORMAL LOW (ref 39.0–52.0)
HEMOGLOBIN: 9 g/dL — AB (ref 13.0–17.0)

## 2017-12-18 NOTE — Progress Notes (Signed)
Physical Therapy Treatment Patient Details Name: [name] MRN: [id number] DOB: 14-Apr-1950 Today's Date: 12/18/2017    History of Present Illness Pt is 67 y.o. male s/p left THA, direct anterior approach (12/14/17), secondary to osteoarthritis. PMH significant for bilateral TKA, DVT of left leg, HTN, CKD, GERD, chronic radicular low back pain, asthma, history of epilepsy.     PT Comments    Patient seen for mobility progression. Pt is making progress toward PT goals and able to ascend/descend stairs simulating home entrance with min A. Continue to progress as tolerated.   Follow Up Recommendations  Home health PT;Other (comment);Supervision - Intermittent(HHRN)     Equipment Recommendations  None recommended by PT    Recommendations for Other Services       Precautions / Restrictions Precautions Precautions: Fall Restrictions Weight Bearing Restrictions: Yes LLE Weight Bearing: Weight bearing as tolerated    Mobility  Bed Mobility Overal bed mobility: Needs Assistance Bed Mobility: Supine to Sit     Supine to sit: Min guard;HOB elevated     General bed mobility comments: heavy use of rails; increased time and effort; no physical assistance needed  Transfers Overall transfer level: Needs assistance Equipment used: Rolling walker (2 wheeled) Transfers: Sit to/from Stand Sit to Stand: Min assist         General transfer comment: assist to power up from EOB and to steay from EOB and recliner; cues for safe hand placement  Ambulation/Gait Ambulation/Gait assistance: Min guard;Min assist Gait Distance (Feet): (~52ft total during session) Assistive device: Rolling walker (2 wheeled) Gait Pattern/deviations: Step-to pattern;Decreased stance time - left;Decreased step length - right;Decreased weight shift to left;Antalgic;Trunk flexed     General Gait Details: multimodal cues for posture; vc for sequencing and step length symmetry   Stairs Stairs:  Yes Stairs assistance: Min assist Stair Management: One rail Left;Step to pattern;Forwards;With cane Number of Stairs: 3 General stair comments: cues for sequencing and assist to steady   Wheelchair Mobility    Modified Rankin (Stroke Patients Only)       Balance Overall balance assessment: Needs assistance Sitting-balance support: Bilateral upper extremity supported;Feet supported Sitting balance-Leahy Scale: Fair     Standing balance support: Bilateral upper extremity supported Standing balance-Leahy Scale: Fair                              Cognition Arousal/Alertness: Awake/alert Behavior During Therapy: WFL for tasks assessed/performed Overall Cognitive Status: Within Functional Limits for tasks assessed                                        Exercises      General Comments        Pertinent Vitals/Pain Pain Assessment: Faces Faces Pain Scale: Hurts even more Pain Location: left knee>left hip Pain Descriptors / Indicators: Grimacing;Guarding;Sore Pain Intervention(s): Limited activity within patient's tolerance;Monitored during session;Repositioned    Home Living                      Prior Function            PT Goals (current goals can now be found in the care plan section) Acute Rehab PT Goals Patient Stated Goal: go home Progress towards PT goals: Progressing toward goals    Frequency    7X/week      PT Plan  Current plan remains appropriate    Co-evaluation              AM-PAC PT "6 Clicks" Mobility   Outcome Measure  Help needed turning from your back to your side while in a flat bed without using bedrails?: A Little Help needed moving from lying on your back to sitting on the side of a flat bed without using bedrails?: A Little Help needed moving to and from a bed to a chair (including a wheelchair)?: A Little Help needed standing up from a chair using your arms (e.g., wheelchair or bedside  chair)?: A Little Help needed to walk in hospital room?: A Little Help needed climbing 3-5 steps with a railing? : A Little 6 Click Score: 18    End of Session Equipment Utilized During Treatment: Gait belt Activity Tolerance: Patient tolerated treatment well Patient left: with call bell/phone within reach;in chair Nurse Communication: Mobility status PT Visit Diagnosis: Unsteadiness on feet (R26.81);Other abnormalities of gait and mobility (R26.89);Muscle weakness (generalized) (M62.81);Difficulty in walking, not elsewhere classified (R26.2);Pain Pain - Right/Left: Left Pain - part of body: Hip;Knee     Time: 7262-0355 PT Time Calculation (min) (ACUTE ONLY): 26 min  Charges:  $Gait Training: 23-37 mins                     Earney Navy, PTA Acute Rehabilitation Services Pager: [phone] Office: [phone]     Darliss Cheney 12/18/2017, 1:05 PM

## 2017-12-18 NOTE — Progress Notes (Signed)
Physical Therapy Treatment Patient Details Name: [name] MRN: [id number] DOB: 03-03-1950 Today's Date: 12/18/2017    History of Present Illness Pt is 67 y.o. male s/p left THA, direct anterior approach (12/14/17), secondary to osteoarthritis. PMH significant for bilateral TKA, DVT of left leg, HTN, CKD, GERD, chronic radicular low back pain, asthma, history of epilepsy.     PT Comments    Patient continues to make progress toward PT goals and tolerated increased gait distance of 150 ft with min guard assist.  Current plan remains appropriate.   Follow Up Recommendations  Home health PT;Other (comment);Supervision - Intermittent(HHRN)     Equipment Recommendations  None recommended by PT    Recommendations for Other Services       Precautions / Restrictions Precautions Precautions: Fall Restrictions Weight Bearing Restrictions: Yes LLE Weight Bearing: Weight bearing as tolerated    Mobility  Bed Mobility Overal bed mobility: Needs Assistance Bed Mobility: Supine to Sit     Supine to sit: Min guard;HOB elevated Sit to supine: Min guard   General bed mobility comments: heavy use of rails; increased time and effort; no physical assistance needed  Transfers Overall transfer level: Needs assistance Equipment used: Rolling walker (2 wheeled) Transfers: Sit to/from Stand Sit to Stand: Min guard         General transfer comment: min guard to stand and assist to stabilize RW  Ambulation/Gait Ambulation/Gait assistance: Min guard;Min assist Gait Distance (Feet): 150 Feet Assistive device: Rolling walker (2 wheeled) Gait Pattern/deviations: Decreased stance time - left;Decreased step length - right;Decreased weight shift to left;Antalgic;Trunk flexed;Step-through pattern;Step-to pattern Gait velocity: decreased   General Gait Details: cues for posture, sequencing, and increased stride and step length symmetry   Stairs Stairs: Yes Stairs assistance:  Min assist Stair Management: One rail Left;Step to pattern;Forwards;With cane Number of Stairs: 3 General stair comments: cues for sequencing and assist to steady   Wheelchair Mobility    Modified Rankin (Stroke Patients Only)       Balance Overall balance assessment: Needs assistance Sitting-balance support: Bilateral upper extremity supported;Feet supported Sitting balance-Leahy Scale: Fair     Standing balance support: Bilateral upper extremity supported Standing balance-Leahy Scale: Poor                              Cognition Arousal/Alertness: Awake/alert Behavior During Therapy: WFL for tasks assessed/performed Overall Cognitive Status: Within Functional Limits for tasks assessed                                        Exercises      General Comments General comments (skin integrity, edema, etc.): HEP handout given and reviewed with pt      Pertinent Vitals/Pain Pain Assessment: 0-10 Pain Score: 5  Faces Pain Scale: Hurts even more Pain Location: left hip Pain Descriptors / Indicators: Grimacing;Guarding;Sore Pain Intervention(s): Monitored during session;Limited activity within patient's tolerance;Repositioned;Premedicated before session    Home Living                      Prior Function            PT Goals (current goals can now be found in the care plan section) Acute Rehab PT Goals Patient Stated Goal: go home Progress towards PT goals: Progressing toward goals    Frequency    7X/week  PT Plan Current plan remains appropriate    Co-evaluation              AM-PAC PT "6 Clicks" Mobility   Outcome Measure  Help needed turning from your back to your side while in a flat bed without using bedrails?: A Little Help needed moving from lying on your back to sitting on the side of a flat bed without using bedrails?: A Little Help needed moving to and from a bed to a chair (including a wheelchair)?:  A Little Help needed standing up from a chair using your arms (e.g., wheelchair or bedside chair)?: A Little Help needed to walk in hospital room?: A Little Help needed climbing 3-5 steps with a railing? : A Little 6 Click Score: 18    End of Session Equipment Utilized During Treatment: Gait belt Activity Tolerance: Patient tolerated treatment well Patient left: with call bell/phone within reach;in bed Nurse Communication: Mobility status PT Visit Diagnosis: Unsteadiness on feet (R26.81);Other abnormalities of gait and mobility (R26.89);Muscle weakness (generalized) (M62.81);Difficulty in walking, not elsewhere classified (R26.2);Pain Pain - Right/Left: Left Pain - part of body: Hip;Knee     Time: 1340-1408 PT Time Calculation (min) (ACUTE ONLY): 28 min  Charges:  $Gait Training: 23-37 mins                     Earney Navy, PTA Acute Rehabilitation Services Pager: [phone] Office: [phone]     Darliss Cheney 12/18/2017, 4:20 PM

## 2017-12-18 NOTE — Progress Notes (Signed)
Written and verbal discharge instructions were given to the patient.  The patient is aware of Home Health following up with him and his scheduled follow up appointments.  The patient will be discharged to his home with his wife.

## 2017-12-18 NOTE — Telephone Encounter (Signed)
Copied from [place] [id number]. Topic: Quick Communication - Rx Refill/Question >> Dec 18, 2017  2:14 PM Bea Graff, NT wrote: Medication: Allegra D 24 hr  Has the patient contacted their pharmacy? Yes.   (Agent: If no, request that the patient contact the pharmacy for the refill.) (Agent: If yes, when and what did the pharmacy advise?)  Preferred Pharmacy (with phone number or street name): [id number] [address], [place] [phone] (Phone) [phone] (Fax)    Agent: Please be advised that RX refills may take up to 3 business days. We ask that you follow-up with your pharmacy.

## 2017-12-18 NOTE — Progress Notes (Signed)
Subjective: 4 Days Post-Op Procedure(s) (LRB): TOTAL HIP ARTHROPLASTY ANTERIOR APPROACH (Left) Patient reports pain as mild.  Taking by mouth and voiding okay.  Feels much more energetic than he did prior to getting 2 units of packed RBCs transfused yesterday.  Objective: Vital signs in last 24 hours: Temp:  [98.2 F (36.8 C)-99.2 F (37.3 C)] 98.8 F (37.1 C) (12/13 0300) Pulse Rate:  [90-103] 92 (12/13 0300) Resp:  [18-20] 18 (12/13 0300) BP: (112-148)/(75-97) 122/86 (12/13 0300) SpO2:  [95 %-99 %] 98 % (12/13 0300)  Intake/Output from previous day: 12/12 0701 - 12/13 0700 In: 1035 [P.O.:720; Blood:315] Out: 1800 [Urine:1800] Intake/Output this shift: No intake/output data recorded.  Recent Labs    12/16/17 0250 12/17/17 0220 12/18/17 0130  HGB 8.9* 7.9* 9.0*   Recent Labs    12/16/17 0250 12/17/17 0220 12/18/17 0130  WBC 12.9* 12.8*  --   RBC 3.24* 2.87*  --   HCT 27.7* 24.7* 28.5*  PLT 198 198  --    Recent Labs    12/16/17 1358  NA 134*  K 3.9  CL 101  CO2 22  BUN 33*  CREATININE 1.46*  GLUCOSE 127*  CALCIUM 8.5*   No results for input(s): LABPT, INR in the last 72 hours. Left hip exam: Neurovascular intact Sensation intact distally Intact pulses distally Dorsiflexion/Plantar flexion intact Incision: dressing C/D/I Compartment soft  Assessment/Plan: 4 Days Post-Op Procedure(s) (LRB): TOTAL HIP ARTHROPLASTY ANTERIOR APPROACH (Left)  Acute blood loss anemia postoperatively symptomatic.  Improved after transfusion of 2 units packed RBCs. Plan: Continue Eliquis that he was taking preoperatively for DVT prophylaxis. Up with therapy Discharge home with home health Follow-up with Dr. Berenice Primas in 10 to 14 days.   Erlene Senters 12/18/2017, 9:21 AM

## 2017-12-19 DIAGNOSIS — Z96653 Presence of artificial knee joint, bilateral: Secondary | ICD-10-CM | POA: Diagnosis not present

## 2017-12-19 DIAGNOSIS — G43009 Migraine without aura, not intractable, without status migrainosus: Secondary | ICD-10-CM | POA: Diagnosis not present

## 2017-12-19 DIAGNOSIS — I129 Hypertensive chronic kidney disease with stage 1 through stage 4 chronic kidney disease, or unspecified chronic kidney disease: Secondary | ICD-10-CM | POA: Diagnosis not present

## 2017-12-19 DIAGNOSIS — Z471 Aftercare following joint replacement surgery: Secondary | ICD-10-CM | POA: Diagnosis not present

## 2017-12-19 DIAGNOSIS — K589 Irritable bowel syndrome without diarrhea: Secondary | ICD-10-CM | POA: Diagnosis not present

## 2017-12-19 DIAGNOSIS — I82532 Chronic embolism and thrombosis of left popliteal vein: Secondary | ICD-10-CM | POA: Diagnosis not present

## 2017-12-19 DIAGNOSIS — N189 Chronic kidney disease, unspecified: Secondary | ICD-10-CM | POA: Diagnosis not present

## 2017-12-19 DIAGNOSIS — Z87891 Personal history of nicotine dependence: Secondary | ICD-10-CM | POA: Diagnosis not present

## 2017-12-19 DIAGNOSIS — I82562 Chronic embolism and thrombosis of left calf muscular vein: Secondary | ICD-10-CM | POA: Diagnosis not present

## 2017-12-19 DIAGNOSIS — G40909 Epilepsy, unspecified, not intractable, without status epilepticus: Secondary | ICD-10-CM | POA: Diagnosis not present

## 2017-12-19 DIAGNOSIS — Z7901 Long term (current) use of anticoagulants: Secondary | ICD-10-CM | POA: Diagnosis not present

## 2017-12-19 DIAGNOSIS — J4540 Moderate persistent asthma, uncomplicated: Secondary | ICD-10-CM | POA: Diagnosis not present

## 2017-12-19 DIAGNOSIS — N400 Enlarged prostate without lower urinary tract symptoms: Secondary | ICD-10-CM | POA: Diagnosis not present

## 2017-12-19 DIAGNOSIS — Z96642 Presence of left artificial hip joint: Secondary | ICD-10-CM | POA: Diagnosis not present

## 2017-12-20 MED ORDER — FEXOFENADINE-PSEUDOEPHED ER 180-240 MG PO TB24: 1.0000 | ORAL_TABLET | Freq: Every day | ORAL | 0 refills | Status: DC | PRN

## 2017-12-22 DIAGNOSIS — J4540 Moderate persistent asthma, uncomplicated: Secondary | ICD-10-CM | POA: Diagnosis not present

## 2017-12-22 DIAGNOSIS — G40909 Epilepsy, unspecified, not intractable, without status epilepticus: Secondary | ICD-10-CM | POA: Diagnosis not present

## 2017-12-22 DIAGNOSIS — N189 Chronic kidney disease, unspecified: Secondary | ICD-10-CM | POA: Diagnosis not present

## 2017-12-22 DIAGNOSIS — Z471 Aftercare following joint replacement surgery: Secondary | ICD-10-CM | POA: Diagnosis not present

## 2017-12-22 DIAGNOSIS — I82532 Chronic embolism and thrombosis of left popliteal vein: Secondary | ICD-10-CM | POA: Diagnosis not present

## 2017-12-22 DIAGNOSIS — I129 Hypertensive chronic kidney disease with stage 1 through stage 4 chronic kidney disease, or unspecified chronic kidney disease: Secondary | ICD-10-CM | POA: Diagnosis not present

## 2017-12-23 DIAGNOSIS — I82532 Chronic embolism and thrombosis of left popliteal vein: Secondary | ICD-10-CM | POA: Diagnosis not present

## 2017-12-23 DIAGNOSIS — I129 Hypertensive chronic kidney disease with stage 1 through stage 4 chronic kidney disease, or unspecified chronic kidney disease: Secondary | ICD-10-CM | POA: Diagnosis not present

## 2017-12-23 DIAGNOSIS — Z471 Aftercare following joint replacement surgery: Secondary | ICD-10-CM | POA: Diagnosis not present

## 2017-12-23 DIAGNOSIS — J4540 Moderate persistent asthma, uncomplicated: Secondary | ICD-10-CM | POA: Diagnosis not present

## 2017-12-23 DIAGNOSIS — G40909 Epilepsy, unspecified, not intractable, without status epilepticus: Secondary | ICD-10-CM | POA: Diagnosis not present

## 2017-12-23 DIAGNOSIS — N189 Chronic kidney disease, unspecified: Secondary | ICD-10-CM | POA: Diagnosis not present

## 2017-12-24 DIAGNOSIS — I82532 Chronic embolism and thrombosis of left popliteal vein: Secondary | ICD-10-CM | POA: Diagnosis not present

## 2017-12-24 DIAGNOSIS — G40909 Epilepsy, unspecified, not intractable, without status epilepticus: Secondary | ICD-10-CM | POA: Diagnosis not present

## 2017-12-24 DIAGNOSIS — J4540 Moderate persistent asthma, uncomplicated: Secondary | ICD-10-CM | POA: Diagnosis not present

## 2017-12-24 DIAGNOSIS — Z471 Aftercare following joint replacement surgery: Secondary | ICD-10-CM | POA: Diagnosis not present

## 2017-12-24 DIAGNOSIS — I129 Hypertensive chronic kidney disease with stage 1 through stage 4 chronic kidney disease, or unspecified chronic kidney disease: Secondary | ICD-10-CM | POA: Diagnosis not present

## 2017-12-24 DIAGNOSIS — N189 Chronic kidney disease, unspecified: Secondary | ICD-10-CM | POA: Diagnosis not present

## 2017-12-25 DIAGNOSIS — Z471 Aftercare following joint replacement surgery: Secondary | ICD-10-CM | POA: Diagnosis not present

## 2017-12-25 DIAGNOSIS — I82532 Chronic embolism and thrombosis of left popliteal vein: Secondary | ICD-10-CM | POA: Diagnosis not present

## 2017-12-25 DIAGNOSIS — G40909 Epilepsy, unspecified, not intractable, without status epilepticus: Secondary | ICD-10-CM | POA: Diagnosis not present

## 2017-12-25 DIAGNOSIS — J4540 Moderate persistent asthma, uncomplicated: Secondary | ICD-10-CM | POA: Diagnosis not present

## 2017-12-25 DIAGNOSIS — N189 Chronic kidney disease, unspecified: Secondary | ICD-10-CM | POA: Diagnosis not present

## 2017-12-25 DIAGNOSIS — I129 Hypertensive chronic kidney disease with stage 1 through stage 4 chronic kidney disease, or unspecified chronic kidney disease: Secondary | ICD-10-CM | POA: Diagnosis not present

## 2017-12-28 DIAGNOSIS — R260 Ataxic gait: Secondary | ICD-10-CM | POA: Diagnosis not present

## 2017-12-28 DIAGNOSIS — Z96642 Presence of left artificial hip joint: Secondary | ICD-10-CM | POA: Diagnosis not present

## 2017-12-28 DIAGNOSIS — Z471 Aftercare following joint replacement surgery: Secondary | ICD-10-CM | POA: Diagnosis not present

## 2017-12-28 DIAGNOSIS — M25552 Pain in left hip: Secondary | ICD-10-CM | POA: Diagnosis not present

## 2017-12-28 DIAGNOSIS — M1612 Unilateral primary osteoarthritis, left hip: Secondary | ICD-10-CM | POA: Diagnosis not present

## 2017-12-29 DIAGNOSIS — R260 Ataxic gait: Secondary | ICD-10-CM | POA: Diagnosis not present

## 2017-12-29 DIAGNOSIS — Z471 Aftercare following joint replacement surgery: Secondary | ICD-10-CM | POA: Diagnosis not present

## 2017-12-29 DIAGNOSIS — M25552 Pain in left hip: Secondary | ICD-10-CM | POA: Diagnosis not present

## 2017-12-29 DIAGNOSIS — Z96642 Presence of left artificial hip joint: Secondary | ICD-10-CM | POA: Diagnosis not present

## 2018-01-04 DIAGNOSIS — Z471 Aftercare following joint replacement surgery: Secondary | ICD-10-CM | POA: Diagnosis not present

## 2018-01-04 DIAGNOSIS — R260 Ataxic gait: Secondary | ICD-10-CM | POA: Diagnosis not present

## 2018-01-04 DIAGNOSIS — M25552 Pain in left hip: Secondary | ICD-10-CM | POA: Diagnosis not present

## 2018-01-04 DIAGNOSIS — Z96642 Presence of left artificial hip joint: Secondary | ICD-10-CM | POA: Diagnosis not present

## 2018-01-07 DIAGNOSIS — Z96642 Presence of left artificial hip joint: Secondary | ICD-10-CM | POA: Diagnosis not present

## 2018-01-07 DIAGNOSIS — Z471 Aftercare following joint replacement surgery: Secondary | ICD-10-CM | POA: Diagnosis not present

## 2018-01-07 DIAGNOSIS — R260 Ataxic gait: Secondary | ICD-10-CM | POA: Diagnosis not present

## 2018-01-07 DIAGNOSIS — M25552 Pain in left hip: Secondary | ICD-10-CM | POA: Diagnosis not present

## 2018-01-11 ENCOUNTER — Other Ambulatory Visit: Payer: Self-pay | Admitting: Family Medicine

## 2018-01-11 DIAGNOSIS — J302 Other seasonal allergic rhinitis: Secondary | ICD-10-CM

## 2018-01-11 DIAGNOSIS — M25552 Pain in left hip: Secondary | ICD-10-CM | POA: Diagnosis not present

## 2018-01-11 NOTE — Telephone Encounter (Signed)
Refill request for general medication: [facility]   Last office visit: 11/30/2017  Last physical exam: 09/15/2017  Follow-ups on file. 03/03/2018

## 2018-01-12 DIAGNOSIS — M25552 Pain in left hip: Secondary | ICD-10-CM | POA: Diagnosis not present

## 2018-01-12 DIAGNOSIS — Z471 Aftercare following joint replacement surgery: Secondary | ICD-10-CM | POA: Diagnosis not present

## 2018-01-12 DIAGNOSIS — R260 Ataxic gait: Secondary | ICD-10-CM | POA: Diagnosis not present

## 2018-01-12 DIAGNOSIS — Z96642 Presence of left artificial hip joint: Secondary | ICD-10-CM | POA: Diagnosis not present

## 2018-01-19 DIAGNOSIS — M25552 Pain in left hip: Secondary | ICD-10-CM | POA: Diagnosis not present

## 2018-01-19 DIAGNOSIS — Z96642 Presence of left artificial hip joint: Secondary | ICD-10-CM | POA: Diagnosis not present

## 2018-01-19 DIAGNOSIS — R260 Ataxic gait: Secondary | ICD-10-CM | POA: Diagnosis not present

## 2018-01-19 DIAGNOSIS — Z471 Aftercare following joint replacement surgery: Secondary | ICD-10-CM | POA: Diagnosis not present

## 2018-01-28 ENCOUNTER — Ambulatory Visit: Payer: Medicare Other | Admitting: Nurse Practitioner

## 2018-01-28 ENCOUNTER — Ambulatory Visit (INDEPENDENT_AMBULATORY_CARE_PROVIDER_SITE_OTHER): Payer: Medicare Other | Admitting: Nurse Practitioner

## 2018-01-28 ENCOUNTER — Ambulatory Visit
Admission: RE | Admit: 2018-01-28 | Discharge: 2018-01-28 | Disposition: A | Discharge status: Home / Self Care | Payer: Medicare Other | Attending: Nurse Practitioner | Admitting: Nurse Practitioner

## 2018-01-28 ENCOUNTER — Encounter: Payer: Self-pay | Admitting: Nurse Practitioner

## 2018-01-28 ENCOUNTER — Ambulatory Visit
Admission: RE | Admit: 2018-01-28 | Discharge: 2018-01-28 | Disposition: A | Discharge status: Home / Self Care | Payer: Medicare Other | Source: Ambulatory Visit | Attending: Nurse Practitioner | Admitting: Nurse Practitioner

## 2018-01-28 VITALS — BP 122/74 | HR 99 | Temp 97.9°F | Resp 14 | Ht 72.0 in | Wt 282.3 lb

## 2018-01-28 DIAGNOSIS — R05 Cough: Secondary | ICD-10-CM

## 2018-01-28 DIAGNOSIS — R059 Cough, unspecified: Secondary | ICD-10-CM

## 2018-01-28 DIAGNOSIS — F1120 Opioid dependence, uncomplicated: Secondary | ICD-10-CM

## 2018-01-28 MED ORDER — FAMOTIDINE 20 MG PO TABS: 20.0000 mg | ORAL_TABLET | Freq: Two times a day (BID) | ORAL | 0 refills | Status: DC

## 2018-01-28 MED ORDER — BENZONATATE 100 MG PO CAPS: 100.0000 mg | ORAL_CAPSULE | Freq: Three times a day (TID) | ORAL | 0 refills | Status: DC | PRN

## 2018-01-28 NOTE — Progress Notes (Signed)
Name: [name]   MRN: [id number]    DOB: 01-Jun-1950   Date:01/28/2018       Progress Note  Subjective  Chief Complaint  Chief Complaint  Patient presents with  . Cough    dry cough for about a month or so - patient has used lozengers and his inhalers  . Withdrawal    from pain meds. sx: head and stomach issues (diarrhea), achy, hot/cold    HPI  Patient endorses one month of intermittent nausea, fatigue, diarrhea, rhinorrhea and aches. States he has had recently had left hip surgery and has been on tramadol and oxycodone states he came of the oxycodone 3 weeks ago and has been trying to get off the tramadol but when he stops taking it all of a sudden he gets the symptoms mentioned before. Called orthopedic and was told not to stop cold [place] again and to taper down, but not sure to taper down.    Currently taking tramadol: 50mg  of tramadol every 6 hours.   Patient states dry cough has been constant for a cough and occasionally gets up a little mucus but this is rare. Cough is worse at night. Has tried dayquil and nyquil and throat lozenge without relief. Takes allergra daily   Patient Active Problem List   Diagnosis Date Noted  . Acute blood loss as cause of postoperative anemia 12/18/2017  . Primary osteoarthritis of left hip 12/14/2017  . 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 Atherosclerosis of abdominal aorta ([place]) 09/15/2017  . Chronic deep vein thrombosis (DVT) of left popliteal vein ([place]) 06/26/2017  . Primary osteoarthritis of left knee 12/19/2016  . Anemia, unspecified 04/30/2016  . History of benign neoplasm of adrenal gland 02/11/2016  . History of iron deficiency anemia 10/10/2015  . BPH (benign prostatic hyperplasia) 06/20/2015  . ED (erectile dysfunction) 06/20/2015  . Allergic rhinitis, seasonal 06/20/2015  . Anemia of chronic disease  06/20/2015  . Hypogonadism in male 06/20/2015  . Asthma, well controlled, moderate persistent 06/20/2015  . Hypertension, benign 06/20/2015  . Hyperglycemia 06/20/2015  . History of shingles 06/20/2015  . GERD without esophagitis 06/20/2015  . Chronic radicular low back pain 06/20/2015  . History of epilepsy 06/20/2015  . Migraine without aura and without status migrainosus, not intractable 06/20/2015  . Dyslipidemia 06/20/2015  . Primary osteoarthritis of both knees 06/20/2015    Past Medical History:  Diagnosis Date  . Allergic rhinitis   . Anemia   . BPH (benign prostatic hyperplasia)   . Chronic kidney disease   . Chronic sinusitis   . Complication of anesthesia    work up during surgery 2x in the past   . Decreased libido   . DVT (deep venous thrombosis) (HCC)    left leg after left knee surgery  . ED (erectile dysfunction)   . Fatigue   . Hematuria   . HTN (hypertension)    history of  . Hypogonadism in male   . Hypokalemia    history of   . IBS (irritable bowel syndrome)   . Low serum vitamin D   . Lumbago   . Migraine   . Mild intermittent asthma   . Osteoarthritis of hip    left (bone-on-bone) Dr. Alta Corning & Alvina Filbert. Modena Slater, PA-C  . Pneumonia    walking pneumonia  . Reflux   . Shingles   . Unilateral inguinal  hernia without obstruction or gangrene     Past Surgical History:  Procedure Laterality Date  . ADRENALECTOMY Left 02/11/2016   [facility]  . COLONOSCOPY  02/2012   normal  . JOINT REPLACEMENT    . KNEE ARTHROSCOPY Left 10/06/2009  . SINUS EXPLORATION    . TOTAL HIP ARTHROPLASTY Left 12/14/2017   Procedure: TOTAL HIP ARTHROPLASTY ANTERIOR APPROACH;  Surgeon: Dorna Leitz, MD;  Location: [facility];  Service: Orthopedics;  Laterality: Left;  . TOTAL KNEE ARTHROPLASTY Right 06/20/2016  . TOTAL KNEE ARTHROPLASTY Right 06/20/2016   Procedure: TOTAL KNEE ARTHROPLASTY;  Surgeon: Dorna Leitz, MD;  Location: [facility];  Service: Orthopedics;  Laterality: Right;   . TOTAL KNEE ARTHROPLASTY Left 12/19/2016   Procedure: LEFT TOTAL KNEE ARTHROPLASTY;  Surgeon: Dorna Leitz, MD;  Location: [facility];  Service: Orthopedics;  Laterality: Left;  Adductor Block    Social History   Tobacco Use  . Smoking status: Former Smoker    Packs/day: 1.00    Years: 10.00    Pack years: 10.00  . Smokeless tobacco: Never Used  . Tobacco comment: 38 years ago 62 when he stopped  Substance Use Topics  . Alcohol use: No    Alcohol/week: 0.0 standard drinks     Current Outpatient Medications:  .  albuterol (PROAIR HFA) 108 (90 Base) MCG/ACT inhaler, Inhale 2 puffs into the lungs every 6 (six) hours as needed for wheezing or shortness of breath., Disp: 1 Inhaler, Rfl: 0 .  apixaban (ELIQUIS) 5 MG TABS tablet, Take 1 tablet (5 mg total) by mouth 2 (two) times daily., Disp: 60 tablet, Rfl: 5 .  azelastine (OPTIVAR) 0.05 % ophthalmic solution, INSTILL 2 DROPS INTO BOTH EYES TWICE A DAY, Disp: 6 mL, Rfl: 5 .  BREO ELLIPTA 200-25 MCG/INH AEPB, Inhale 1 puff into the lungs daily., Disp: 60 each, Rfl: 5 .  cholecalciferol (VITAMIN D3) 25 MCG (1000 UT) tablet, Take 1,000 Units by mouth daily., Disp: , Rfl:  .  docusate sodium (COLACE) 100 MG capsule, Take 1 capsule (100 mg total) by mouth 2 (two) times daily. (Patient taking differently: Take 100 mg by mouth daily as needed for moderate constipation. ), Disp: 30 capsule, Rfl: 0 .  docusate sodium (COLACE) 100 MG capsule, Take 1 capsule (100 mg total) by mouth 2 (two) times daily., Disp: 30 capsule, Rfl: 0 .  fexofenadine (ALLEGRA) 180 MG tablet, Take 180 mg by mouth daily as needed for allergies or rhinitis., Disp: , Rfl:  .  fexofenadine-pseudoephedrine (ALLEGRA-D 24) 180-240 MG 24 hr tablet, Take 1 tablet by mouth daily as needed (for allergies.)., Disp: 30 tablet, Rfl: 0 .  fluticasone (FLONASE) 50 MCG/ACT nasal spray, Place 2 sprays into both nostrils daily. (Patient taking differently: Place 2 sprays into both nostrils daily as  needed for allergies. ), Disp: 16 g, Rfl: 5 .  Glucosamine-MSM-Hyaluronic Acd (JOINT HEALTH PO), Take 1 tablet by mouth daily. INSTAFLEX ADVANCED JOINT SUPPORT, Disp: , Rfl:  .  Lactobacillus Rhamnosus, GG, (CULTURELLE) CAPS, Take 1 capsule by mouth daily. , Disp: , Rfl:  .  Misc Natural Products (GINSENG COMPLEX PO), Take 1 tablet by mouth daily., Disp: , Rfl:  .  Misc Natural Products (NF FORMULAS TESTOSTERONE PO), Take 2 tablets by mouth daily. NATURE'S PLUS T-MALE SUPPLEMENT, Disp: , Rfl:  .  montelukast (SINGULAIR) 10 MG tablet, Take 1 tablet (10 mg total) by mouth daily., Disp: 30 tablet, Rfl: 5 .  Multiple Vitamins-Minerals (MULTIVITAMIN ADULTS 50+ PO), Take 1 tablet  by mouth daily. NATURE'S CODE MEN OVER 50 MULTIVITAMIN PACK , Disp: , Rfl:  .  OVER THE COUNTER MEDICATION, Take 1 tablet by mouth 3 (three) times daily. TEST-HD TESTOSTERONE SUPPORT , Disp: , Rfl:  .  oxyCODONE (OXY IR/ROXICODONE) 5 MG immediate release tablet, , Disp: , Rfl:  .  oxyCODONE-acetaminophen (PERCOCET/ROXICET) 5-325 MG tablet, Take 1-2 tablets by mouth every 6 (six) hours as needed for severe pain., Disp: 60 tablet, Rfl: 0 .  patiromer (VELTASSA) 8.4 g packet, Take 8.4 g by mouth daily. , Disp: , Rfl:  .  Polyethyl Glycol-Propyl Glycol (SYSTANE ULTRA) 0.4-0.3 % SOLN, Place 1-2 drops into both eyes 3 (three) times daily as needed (for dry/irritated eyes.)., Disp: , Rfl:  .  tadalafil (CIALIS) 5 MG tablet, Take 1 tablet (5 mg total) by mouth daily as needed for erectile dysfunction. (Patient taking differently: Take 5 mg by mouth daily. ), Disp: 30 tablet, Rfl: 5 .  tamsulosin (FLOMAX) 0.4 MG CAPS capsule, Take 1 capsule (0.4 mg total) by mouth every evening., Disp: 30 capsule, Rfl: 5 .  tiZANidine (ZANAFLEX) 2 MG tablet, Take 1 tablet (2 mg total) by mouth 3 (three) times daily., Disp: 90 tablet, Rfl: 5 .  traMADol (ULTRAM) 50 MG tablet, Take 50 mg by mouth every 6 (six) hours as needed for moderate pain. , Disp: , Rfl:   .  vitamin B-12 (CYANOCOBALAMIN) 500 MCG tablet, Take 500 mcg by mouth daily., Disp: , Rfl:   Allergies  Allergen Reactions  . Almond (Diagnostic) Other (See Comments)    Migraines  . Lactose Intolerance (Gi) Other (See Comments)    MIGRAINES  . Peanut-Containing Drug Products Other (See Comments)    Migraines  . Shellfish Allergy Other (See Comments)    Congestion/breathing problems/migraines.    ROS   No other specific complaints in a complete review of systems (except as listed in HPI above).  Objective  Vitals:   01/28/18 1109  BP: 122/74  Pulse: 99  Resp: 14  Temp: 97.9 F (36.6 C)  TempSrc: Oral  SpO2: 98%  Weight: 282 lb 4.8 oz (128.1 kg)  Height: 6' (1.829 m)    Body mass index is 38.29 kg/m.  Nursing Note and Vital Signs reviewed.  Physical Exam Constitutional:      Appearance: Normal appearance.  HENT:     Head: Normocephalic and atraumatic.  Eyes:     Conjunctiva/sclera: Conjunctivae normal.     Pupils: Pupils are equal, round, and reactive to light.  Cardiovascular:     Rate and Rhythm: Normal rate and regular rhythm.  Pulmonary:     [place]: Pulmonary effort is normal.     Breath sounds: Normal breath sounds.  Musculoskeletal:     Comments: Uses cane  Skin:    General: Skin is warm and dry.     Findings: No erythema.  Neurological:     General: No focal deficit present.     Mental Status: He is alert and oriented to person, place, and time.  Psychiatric:        Mood and Affect: Mood normal.        Thought Content: Thought content normal.       No results found for this or any previous visit (from the past 48 hour(s)).  Assessment & Plan  1. Cough - Please go across the street to get a chest x-ray. You should receive a phone call before the weekend to discuss results - Please take cough medicine every  8 hours for the first 3 days and then just as needed. - Please take famotidine nightly for the next week to see if this resolves  coughing some as well.  - DG Chest 2 View; Future - benzonatate (TESSALON) 100 MG capsule; Take 1 capsule (100 mg total) by mouth 3 (three) times daily as needed for cough.  Dispense: 30 capsule; Refill: 0 - famotidine (PEPCID) 20 MG tablet; Take 1 tablet (20 mg total) by mouth 2 (two) times daily.  Dispense: 30 tablet; Refill: 0  2. Uncomplicated opioid dependence (HCC) TAPERING TRAMADOL: Currently taking: 50mg  of tramadol every 6 hours  Continue taking tramadol every 6 hours for the next 3 days.  -If able to tolerate cut down to taking tramadol every 8 hours for one week. And additionally take acetaminophen 650mg  every 8 hours. ( no more than 3,000mg  of acetaminophen in 24 hours).  - If able to tolerate start taking tramadol every 12 hours.And additionally take acetaminophen 650mg  every 6 hours. ( no more thant 3,000mg  of acetaminophen in 24 hours).  - If able to tolerate start taking tramadol just as needed in the day and take acetaminophen 650 mg first for pain, using ice/heat, icy-hot patches and lidocaine patches OTC.    Do not make any adjustments before 2-3 days of having a stable dose.  Discuss this plan with your orthopedic.

## 2018-01-28 NOTE — Patient Instructions (Addendum)
-   Please go across the street to get a chest x-ray. You should receive a phone call before the weekend to discuss results - Please take cough medicine every 8 hours for the first 3 days and then just as needed. - Please take famotidine nightly for the next week to see if this resolves coughing some as well.   TAPERING TRAMADOL: Currently taking: 50mg  of tramadol every 6 hours  Continue taking tramadol every 6 hours for the next 3 days.  -If able to tolerate cut down to taking tramadol every 8 hours for one week. And additionally take acetaminophen 650mg  every 8 hours. ( no more than 3,000mg  of acetaminophen in 24 hours).  - If able to tolerate start taking tramadol every 12 hours.And additionally take acetaminophen 650mg  every 6 hours. ( no more thant 3,000mg  of acetaminophen in 24 hours).  - If able to tolerate start taking tramadol just as needed in the day and take acetaminophen 650 mg first for pain, using ice/heat, icy-hot patches and lidocaine patches OTC.    Do not make any adjustments before 2-3 days of having a stable dose.  Discuss this plan with your orthopedic.

## 2018-02-15 ENCOUNTER — Other Ambulatory Visit: Payer: Self-pay | Admitting: Nurse Practitioner

## 2018-02-15 DIAGNOSIS — R05 Cough: Secondary | ICD-10-CM

## 2018-02-15 DIAGNOSIS — R059 Cough, unspecified: Secondary | ICD-10-CM

## 2018-02-23 ENCOUNTER — Inpatient Hospital Stay: Payer: Medicare Other

## 2018-02-23 ENCOUNTER — Inpatient Hospital Stay: Payer: Medicare Other | Attending: Oncology | Admitting: Oncology

## 2018-02-25 DIAGNOSIS — Z9889 Other specified postprocedural states: Secondary | ICD-10-CM | POA: Diagnosis not present

## 2018-02-25 DIAGNOSIS — M1612 Unilateral primary osteoarthritis, left hip: Secondary | ICD-10-CM | POA: Diagnosis not present

## 2018-03-03 ENCOUNTER — Ambulatory Visit: Payer: Medicare Other | Admitting: Family Medicine

## 2018-05-04 ENCOUNTER — Other Ambulatory Visit: Payer: Self-pay | Admitting: Nurse Practitioner

## 2018-05-04 DIAGNOSIS — R059 Cough, unspecified: Secondary | ICD-10-CM

## 2018-05-04 DIAGNOSIS — R05 Cough: Secondary | ICD-10-CM

## 2018-05-20 DIAGNOSIS — I10 Essential (primary) hypertension: Secondary | ICD-10-CM | POA: Diagnosis not present

## 2018-05-20 DIAGNOSIS — N183 Chronic kidney disease, stage 3 (moderate): Secondary | ICD-10-CM | POA: Diagnosis not present

## 2018-05-20 DIAGNOSIS — N182 Chronic kidney disease, stage 2 (mild): Secondary | ICD-10-CM | POA: Diagnosis not present

## 2018-05-20 DIAGNOSIS — E875 Hyperkalemia: Secondary | ICD-10-CM | POA: Diagnosis not present

## 2018-05-20 DIAGNOSIS — D631 Anemia in chronic kidney disease: Secondary | ICD-10-CM | POA: Diagnosis not present

## 2018-06-10 ENCOUNTER — Other Ambulatory Visit: Payer: Self-pay

## 2018-06-10 DIAGNOSIS — R05 Cough: Secondary | ICD-10-CM

## 2018-06-10 DIAGNOSIS — R059 Cough, unspecified: Secondary | ICD-10-CM

## 2018-06-11 ENCOUNTER — Other Ambulatory Visit: Payer: Self-pay

## 2018-06-11 ENCOUNTER — Other Ambulatory Visit: Payer: Self-pay | Admitting: Family Medicine

## 2018-06-11 DIAGNOSIS — R05 Cough: Secondary | ICD-10-CM

## 2018-06-11 DIAGNOSIS — R059 Cough, unspecified: Secondary | ICD-10-CM

## 2018-06-11 MED ORDER — FAMOTIDINE 20 MG PO TABS: 20.0000 mg | ORAL_TABLET | Freq: Two times a day (BID) | ORAL | 0 refills | Status: DC

## 2018-06-11 NOTE — Telephone Encounter (Signed)
Copied from [place] [id number]. Topic: Quick Communication - Rx Refill/Question >> Jun 11, 2018  3:38 PM Gustavus Messing wrote: Medication: famotidine (PEPCID) 20 MG tablet   Has the patient contacted their pharmacy? Yes.   (Agent: If yes, when and what did the pharmacy advise?) The patient has spoken to the pharmacy and the office about refiling this medication. I do not see any records of that. The patient is completely out of this medication  Preferred Pharmacy (with phone number or street name): [id number] [address], [place] [phone] (Phone) [phone] (Fax)    Agent: Please be advised that RX refills may take up to 3 business days. We ask that you follow-up with your pharmacy.

## 2018-06-11 NOTE — Telephone Encounter (Signed)
Has an appt on 06/22/2018

## 2018-06-15 ENCOUNTER — Other Ambulatory Visit: Payer: Self-pay

## 2018-06-15 DIAGNOSIS — R059 Cough, unspecified: Secondary | ICD-10-CM

## 2018-06-15 DIAGNOSIS — R05 Cough: Secondary | ICD-10-CM

## 2018-06-15 MED ORDER — FAMOTIDINE 20 MG PO TABS: 20.0000 mg | ORAL_TABLET | Freq: Two times a day (BID) | ORAL | 0 refills | Status: DC

## 2018-06-15 NOTE — Telephone Encounter (Signed)
Copied from [place] [id number]. Topic: Quick Communication - Rx Refill/Question >> Jun 11, 2018  3:38 PM Gustavus Messing wrote: Medication: famotidine (PEPCID) 20 MG tablet   Has the patient contacted their pharmacy? Yes.   (Agent: If yes, when and what did the pharmacy advise?) The patient has spoken to the pharmacy and the office about refiling this medication. I do not see any records of that. The patient is completely out of this medication  Preferred Pharmacy (with phone number or street name): [id number] [address], [place] [phone] (Phone) [phone] (Fax)    Agent: Please be advised that RX refills may take up to 3 business days. We ask that you follow-up with your pharmacy.

## 2018-06-22 ENCOUNTER — Ambulatory Visit (INDEPENDENT_AMBULATORY_CARE_PROVIDER_SITE_OTHER): Payer: Medicare Other | Admitting: Family Medicine

## 2018-06-22 ENCOUNTER — Other Ambulatory Visit: Payer: Self-pay

## 2018-06-22 ENCOUNTER — Encounter: Payer: Self-pay | Admitting: Family Medicine

## 2018-06-22 VITALS — BP 120/90 | HR 101 | Temp 98.1°F

## 2018-06-22 DIAGNOSIS — K219 Gastro-esophageal reflux disease without esophagitis: Secondary | ICD-10-CM

## 2018-06-22 DIAGNOSIS — N138 Other obstructive and reflux uropathy: Secondary | ICD-10-CM | POA: Diagnosis not present

## 2018-06-22 DIAGNOSIS — J301 Allergic rhinitis due to pollen: Secondary | ICD-10-CM | POA: Diagnosis not present

## 2018-06-22 DIAGNOSIS — Z79899 Other long term (current) drug therapy: Secondary | ICD-10-CM | POA: Diagnosis not present

## 2018-06-22 DIAGNOSIS — J4540 Moderate persistent asthma, uncomplicated: Secondary | ICD-10-CM | POA: Diagnosis not present

## 2018-06-22 DIAGNOSIS — I82562 Chronic embolism and thrombosis of left calf muscular vein: Secondary | ICD-10-CM

## 2018-06-22 DIAGNOSIS — D638 Anemia in other chronic diseases classified elsewhere: Secondary | ICD-10-CM | POA: Diagnosis not present

## 2018-06-22 DIAGNOSIS — I700 Atherosclerosis of aorta: Secondary | ICD-10-CM

## 2018-06-22 DIAGNOSIS — N401 Enlarged prostate with lower urinary tract symptoms: Secondary | ICD-10-CM

## 2018-06-22 LAB — COMPLETE METABOLIC PANEL WITH GFR
AG Ratio: 1.3 (calc) (ref 1.0–2.5)
ALT: 17 U/L (ref 9–46)
AST: 20 U/L (ref 10–35)
Albumin: 4.3 g/dL (ref 3.6–5.1)
Alkaline phosphatase (APISO): 92 U/L (ref 35–144)
BUN/Creatinine Ratio: 17 (calc) (ref 6–22)
BUN: 30 mg/dL — ABNORMAL HIGH (ref 7–25)
CO2: 26 mmol/L (ref 20–32)
Calcium: 10.1 mg/dL (ref 8.6–10.3)
Chloride: 103 mmol/L (ref 98–110)
Creat: 1.75 mg/dL — ABNORMAL HIGH (ref 0.70–1.25)
GFR, Est African American: 46 mL/min/{1.73_m2} — ABNORMAL LOW (ref >=60)
GFR, Est Non African American: 39 mL/min/{1.73_m2} — ABNORMAL LOW (ref >=60)
Globulin: 3.4 g/dL (calc) (ref 1.9–3.7)
Glucose, Bld: 108 mg/dL — ABNORMAL HIGH (ref 65–99)
Potassium: 5 mmol/L (ref 3.5–5.3)
Sodium: 136 mmol/L (ref 135–146)
Total Bilirubin: 0.4 mg/dL (ref 0.2–1.2)
Total Protein: 7.7 g/dL (ref 6.1–8.1)

## 2018-06-22 LAB — CBC WITH DIFFERENTIAL/PLATELET
Absolute Monocytes: 540 cells/uL (ref 200–950)
Basophils Absolute: 48 cells/uL (ref 0–200)
Basophils Relative: 0.8 %
Eosinophils Absolute: 192 cells/uL (ref 15–500)
Eosinophils Relative: 3.2 %
HCT: 38.1 % — ABNORMAL LOW (ref 38.5–50.0)
Hemoglobin: 12.4 g/dL — ABNORMAL LOW (ref 13.2–17.1)
Lymphs Abs: 2046 cells/uL (ref 850–3900)
MCH: 27.8 pg (ref 27.0–33.0)
MCHC: 32.5 g/dL (ref 32.0–36.0)
MCV: 85.4 fL (ref 80.0–100.0)
MPV: 10.7 fL (ref 7.5–12.5)
Monocytes Relative: 9 %
Neutro Abs: 3174 cells/uL (ref 1500–7800)
Neutrophils Relative %: 52.9 %
Platelets: 267 10*3/uL (ref 140–400)
RBC: 4.46 10*6/uL (ref 4.20–5.80)
RDW: 13.4 % (ref 11.0–15.0)
Total Lymphocyte: 34.1 %
WBC: 6 10*3/uL (ref 3.8–10.8)

## 2018-06-22 LAB — IRON,TIBC AND FERRITIN PANEL
%SAT: 17 % (calc) — ABNORMAL LOW (ref 20–48)
Ferritin: 383 ng/mL — ABNORMAL HIGH (ref 24–380)
Iron: 51 ug/dL (ref 50–180)
TIBC: 292 mcg/dL (calc) (ref 250–425)

## 2018-06-22 MED ORDER — MONTELUKAST SODIUM 10 MG PO TABS: 10.0000 mg | ORAL_TABLET | Freq: Every day | ORAL | 5 refills | Status: DC

## 2018-06-22 MED ORDER — APIXABAN 5 MG PO TABS: 5.0000 mg | ORAL_TABLET | Freq: Two times a day (BID) | ORAL | 5 refills | Status: DC

## 2018-06-22 MED ORDER — FAMOTIDINE 20 MG PO TABS: 20.0000 mg | ORAL_TABLET | Freq: Two times a day (BID) | ORAL | 5 refills | Status: DC

## 2018-06-22 MED ORDER — TAMSULOSIN HCL 0.4 MG PO CAPS: 0.4000 mg | ORAL_CAPSULE | Freq: Every evening | ORAL | 5 refills | Status: DC

## 2018-06-22 MED ORDER — ATORVASTATIN CALCIUM 20 MG PO TABS: 20.0000 mg | ORAL_TABLET | Freq: Every day | ORAL | 1 refills | Status: DC

## 2018-06-22 MED ORDER — TADALAFIL 5 MG PO TABS: 5.0000 mg | ORAL_TABLET | Freq: Every day | ORAL | 5 refills | Status: DC

## 2018-06-22 MED ORDER — BREO ELLIPTA 200-25 MCG/INH IN AEPB: 1.0000 | INHALATION_SPRAY | Freq: Every day | RESPIRATORY_TRACT | 5 refills | Status: DC

## 2018-06-22 MED ORDER — FLUTICASONE PROPIONATE 50 MCG/ACT NA SUSP: 2.0000 | Freq: Every day | NASAL | 5 refills | Status: DC

## 2018-06-22 MED ORDER — ALBUTEROL SULFATE HFA 108 (90 BASE) MCG/ACT IN AERS: 2.0000 | INHALATION_SPRAY | Freq: Four times a day (QID) | RESPIRATORY_TRACT | 0 refills | Status: DC | PRN

## 2018-06-22 NOTE — Progress Notes (Signed)
Name: [name]   MRN: [id number]    DOB: Jun 29, 1950   Date:06/22/2018       Progress Note  Subjective  Chief Complaint  Chief Complaint  Patient presents with  . Asthma  . Hypertension  . Medication Refill  . Gastroesophageal Reflux    continues to have burping. Wants to find out what the source of the burping may be.    I connected with  [name]  on 06/22/18 at  8:20 AM EDT by a video enabled telemedicine application and verified that I am speaking with the correct person using two identifiers.  I discussed the limitations of evaluation and management by telemedicine and the availability of in person appointments. The patient expressed understanding and agreed to proceed. Staff also discussed with the patient that there may be a patient responsible charge related to this service. [facility]  Provider Location: at home  Additional Individuals present: wife  HPI   Obese: he has lost almost 20 lbs since March 2018 but gained it all back , last visit one month ago with [name] weight was 303 lbs. He states not moving as much because of COVID-19, working from home  Asthma Moderate: he is using [employer] daily, Proairprn only at most once a week usually when moderate physical activity   History of anemia: but seen by hematologist level back in Nov was  11.5 . Had blood transfusion durin after his left hip replacement surgery Dec 2020. His last hbg at hospital was 9, discussed repeating labs now. No pica  Left Hip surgery : doing well, finished PT. No DVT this time around   CKI stage II: and hyperkalemia, under the care of [name]. No pruritus. Good urine output Per patient his last level was slightly low, but fluctuates , last GFR 64.   Dyslipidemia: he is not on medication, low HDl reviewed labs with patient,  Explained he has atherosclerosis of aorta and is important to take medication he is willing to take it    Chronic DVT: on Eliquis, no side effects of medication , no easy bruising. No leg pain with ambulation  GERD and eructation: he continues to have bloating and burping, mild GERD, taking Pepcid, advised to stop drinking from a straw, not to chew gum   Patient Active Problem List   Diagnosis Date Noted  . Acute blood loss as cause of postoperative anemia 12/18/2017  . Primary osteoarthritis of left hip 12/14/2017  . 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 Atherosclerosis of abdominal aorta ([place]) 09/15/2017  . Chronic deep vein thrombosis (DVT) of left popliteal vein ([place]) 06/26/2017  . Primary osteoarthritis of left knee 12/19/2016  . Anemia, unspecified 04/30/2016  . History of benign neoplasm of adrenal gland 02/11/2016  . History of iron deficiency anemia 10/10/2015  . BPH (benign prostatic hyperplasia) 06/20/2015  . ED (erectile dysfunction) 06/20/2015  . Allergic rhinitis, seasonal 06/20/2015  . Anemia of chronic disease 06/20/2015  . Hypogonadism in male 06/20/2015  . Asthma, well controlled, moderate persistent 06/20/2015  . Hypertension, benign 06/20/2015  . Hyperglycemia 06/20/2015  . History of shingles 06/20/2015  . GERD without esophagitis 06/20/2015  . Chronic radicular low back pain 06/20/2015  . History of epilepsy 06/20/2015  . Migraine without aura and without status migrainosus, not intractable 06/20/2015  . Dyslipidemia 06/20/2015  . Primary osteoarthritis of both knees  06/20/2015    Past Surgical History:  Procedure Laterality Date  . ADRENALECTOMY Left 02/11/2016   [facility]  . COLONOSCOPY  02/2012   normal  . JOINT REPLACEMENT    . KNEE ARTHROSCOPY Left 10/06/2009  . SINUS EXPLORATION    . TOTAL HIP ARTHROPLASTY Left 12/14/2017   Procedure: TOTAL HIP ARTHROPLASTY ANTERIOR APPROACH;  Surgeon: Terry Leitz, MD;  Location: [facility];  Service:  Orthopedics;  Laterality: Left;  . TOTAL KNEE ARTHROPLASTY Right 06/20/2016  . TOTAL KNEE ARTHROPLASTY Right 06/20/2016   Procedure: TOTAL KNEE ARTHROPLASTY;  Surgeon: Terry Leitz, MD;  Location: [facility];  Service: Orthopedics;  Laterality: Right;  . TOTAL KNEE ARTHROPLASTY Left 12/19/2016   Procedure: LEFT TOTAL KNEE ARTHROPLASTY;  Surgeon: Terry Leitz, MD;  Location: [facility];  Service: Orthopedics;  Laterality: Left;  Adductor Block    Family History  Problem Relation Age of Onset  . Diabetes Mother   . Heart disease Mother   . Lung disease Mother   . Seizures Maternal Grandmother   . Alzheimer's disease Brother     Social History   Socioeconomic History  . Marital status: Married    Spouse name: [name]  . Number of children: 2  . Years of education: Not on file  . Highest education level: Professional school degree (e.g., MD, DDS, DVM, JD)  Occupational History  . Occupation: [employer]  Social Needs  . Financial resource strain: Not hard at all  . Food insecurity    Worry: Never true    Inability: Never true  . Transportation needs    Medical: No    Non-medical: No  Tobacco Use  . Smoking status: Former Smoker    Packs/day: 1.00    Years: 10.00    Pack years: 10.00  . Smokeless tobacco: Never Used  . Tobacco comment: 38 years ago 89 when he stopped  Substance and Sexual Activity  . Alcohol use: No    Alcohol/week: 0.0 standard drinks  . Drug use: No  . Sexual activity: Yes    Partners: Female  Lifestyle  . Physical activity    Days per week: 0 days    Minutes per session: 0 min  . Stress: Not at all  Relationships  . Social Herbalist on phone: Twice a week    Gets together: Three times a week    Attends religious service: More than 4 times per year    Active member of club or organization: Yes    Attends meetings of clubs or organizations: More than 4 times per year    Relationship status: Married  . Intimate partner violence     Fear of current or ex partner: No    Emotionally abused: No    Physically abused: No    Forced sexual activity: No  Other Topics Concern  . Not on file  Social History Narrative   Patient has 8 grands     Current Outpatient Medications:  .  albuterol (PROAIR HFA) 108 (90 Base) MCG/ACT inhaler, Inhale 2 puffs into the lungs every 6 (six) hours as needed for wheezing or shortness of breath., Disp: 1 Inhaler, Rfl: 0 .  apixaban (ELIQUIS) 5 MG TABS tablet, Take 1 tablet (5 mg total) by mouth 2 (two) times daily., Disp: 60 tablet, Rfl: 5 .  azelastine (OPTIVAR) 0.05 % ophthalmic solution, INSTILL 2 DROPS INTO BOTH EYES TWICE A DAY, Disp: 6 mL, Rfl: 5 .  BREO ELLIPTA 200-25 MCG/INH AEPB, Inhale  1 puff into the lungs daily., Disp: 60 each, Rfl: 5 .  cholecalciferol (VITAMIN D3) 25 MCG (1000 UT) tablet, Take 1,000 Units by mouth daily., Disp: , Rfl:  .  docusate sodium (COLACE) 100 MG capsule, Take 1 capsule (100 mg total) by mouth 2 (two) times daily. (Patient taking differently: Take 100 mg by mouth daily as needed for moderate constipation. ), Disp: 30 capsule, Rfl: 0 .  docusate sodium (COLACE) 100 MG capsule, Take 1 capsule (100 mg total) by mouth 2 (two) times daily., Disp: 30 capsule, Rfl: 0 .  famotidine (PEPCID) 20 MG tablet, Take 1 tablet (20 mg total) by mouth 2 (two) times daily., Disp: 60 tablet, Rfl: 5 .  fexofenadine (ALLEGRA) 180 MG tablet, Take 180 mg by mouth daily as needed for allergies or rhinitis., Disp: , Rfl:  .  fexofenadine-pseudoephedrine (ALLEGRA-D 24) 180-240 MG 24 hr tablet, Take 1 tablet by mouth daily as needed (for allergies.)., Disp: 30 tablet, Rfl: 0 .  fluticasone (FLONASE) 50 MCG/ACT nasal spray, Place 2 sprays into both nostrils daily., Disp: 16 g, Rfl: 5 .  Glucosamine-MSM-Hyaluronic Acd (JOINT HEALTH PO), Take 1 tablet by mouth daily. INSTAFLEX ADVANCED JOINT SUPPORT, Disp: , Rfl:  .  Lactobacillus Rhamnosus, GG, (CULTURELLE) CAPS, Take 1 capsule by mouth  daily. , Disp: , Rfl:  .  Misc Natural Products (GINSENG COMPLEX PO), Take 1 tablet by mouth daily., Disp: , Rfl:  .  Misc Natural Products (NF FORMULAS TESTOSTERONE PO), Take 2 tablets by mouth daily. NATURE'S PLUS T-MALE SUPPLEMENT, Disp: , Rfl:  .  montelukast (SINGULAIR) 10 MG tablet, Take 1 tablet (10 mg total) by mouth daily., Disp: 30 tablet, Rfl: 5 .  Multiple Vitamins-Minerals (MULTIVITAMIN ADULTS 50+ PO), Take 1 tablet by mouth daily. NATURE'S CODE MEN OVER 50 MULTIVITAMIN PACK , Disp: , Rfl:  .  patiromer (VELTASSA) 8.4 g packet, Take 8.4 g by mouth daily. , Disp: , Rfl:  .  Polyethyl Glycol-Propyl Glycol (SYSTANE ULTRA) 0.4-0.3 % SOLN, Place 1-2 drops into both eyes 3 (three) times daily as needed (for dry/irritated eyes.)., Disp: , Rfl:  .  tadalafil (CIALIS) 5 MG tablet, Take 1 tablet (5 mg total) by mouth daily., Disp: 30 tablet, Rfl: 5 .  tamsulosin (FLOMAX) 0.4 MG CAPS capsule, Take 1 capsule (0.4 mg total) by mouth every evening., Disp: 30 capsule, Rfl: 5 .  tiZANidine (ZANAFLEX) 2 MG tablet, Take 1 tablet (2 mg total) by mouth 3 (three) times daily., Disp: 90 tablet, Rfl: 5 .  vitamin B-12 (CYANOCOBALAMIN) 500 MCG tablet, Take 500 mcg by mouth daily., Disp: , Rfl:  .  OVER THE COUNTER MEDICATION, Take 1 tablet by mouth 3 (three) times daily. TEST-HD TESTOSTERONE SUPPORT , Disp: , Rfl:   Allergies  Allergen Reactions  . Almond (Diagnostic) Other (See Comments)    Migraines  . Lactose Intolerance (Gi) Other (See Comments)    MIGRAINES  . Peanut-Containing Drug Products Other (See Comments)    Migraines  . Shellfish Allergy Other (See Comments)    Congestion/breathing problems/migraines.    I personally reviewed active problem list, medication list, allergies, family history, social history with the patient/caregiver today.   ROS  Ten systems reviewed and is negative except as mentioned in HPI  Objective  He came in for labs and had vitals done Today's Vitals    06/22/18 1039  BP: 120/90  Pulse: (!) 101  Temp: 98.1 F (36.7 C)  TempSrc: Oral  SpO2: 96%   There  is no height or weight on file to calculate BMI.  Physical Exam  Awake, alert and oriented and in no distress    PHQ2/9: Depression screen [facility] 2/9 06/22/2018 01/28/2018 09/15/2017 09/15/2017 06/02/2017  Decreased Interest 0 0 0 0 0  Down, Depressed, Hopeless 0 1 0 0 0  PHQ - 2 Score 0 1 0 0 0  Altered sleeping 0 - 0 - -  Tired, decreased energy 0 - 1 - -  Change in appetite 0 - 0 - -  Feeling bad or failure about yourself  0 - 0 - -  Trouble concentrating 0 - 0 - -  Moving slowly or fidgety/restless 0 - 0 - -  Suicidal thoughts 0 - 0 - -  PHQ-9 Score 0 - 1 - -  Difficult doing work/chores - - Not difficult at all - -   PHQ-2/9 Result is negative.    Fall Risk: Fall Risk  06/22/2018 01/28/2018 11/30/2017 09/15/2017 06/02/2017  Falls in the past year? 0 0 0 Yes No  Number falls in past yr: 0 0 - 2 or more -  Injury with Fall? 0 0 - No -    Assessment & Plan   1. Asthma, well controlled, moderate persistent  - albuterol (PROAIR HFA) 108 (90 Base) MCG/ACT inhaler; Inhale 2 puffs into the lungs every 6 (six) hours as needed for wheezing or shortness of breath.  Dispense: 1 Inhaler; Refill: 0 - BREO ELLIPTA 200-25 MCG/INH AEPB; Inhale 1 puff into the lungs daily.  Dispense: 60 each; Refill: 5 - montelukast (SINGULAIR) 10 MG tablet; Take 1 tablet (10 mg total) by mouth daily.  Dispense: 30 tablet; Refill: 5  2. Chronic deep vein thrombosis (DVT) of calf muscle vein of left lower extremity  - apixaban (ELIQUIS) 5 MG TABS tablet; Take 1 tablet (5 mg total) by mouth 2 (two) times daily.  Dispense: 60 tablet; Refill: 5  3. Seasonal allergic rhinitis due to pollen  - fluticasone (FLONASE) 50 MCG/ACT nasal spray; Place 2 sprays into both nostrils daily.  Dispense: 16 g; Refill: 5  4. Benign prostatic hyperplasia with urinary obstruction  - tadalafil (CIALIS) 5 MG tablet; Take 1  tablet (5 mg total) by mouth daily.  Dispense: 30 tablet; Refill: 5 - tamsulosin (FLOMAX) 0.4 MG CAPS capsule; Take 1 capsule (0.4 mg total) by mouth every evening.  Dispense: 30 capsule; Refill: 5  5. Gastroesophageal reflux disease without esophagitis  - famotidine (PEPCID) 20 MG tablet; Take 1 tablet (20 mg total) by mouth 2 (two) times daily.  Dispense: 60 tablet; Refill: 5  6. Atherosclerosis of abdominal aorta (HCC)  Discussed statin therapy, and he is willing to try it   7. Anemia of chronic disease  - CBC with Differential/Platelet - Iron, TIBC and Ferritin Panel  8. Long-term use of high-risk medication  - COMPLETE METABOLIC PANEL WITH GFR I discussed the assessment and treatment plan with the patient. The patient was provided an opportunity to ask questions and all were answered. The patient agreed with the plan and demonstrated an understanding of the instructions.  The patient was advised to call back or seek an in-person evaluation if the symptoms worsen or if the condition fails to improve as anticipated.  I provided 25  minutes of non-face-to-face time during this encounter.

## 2018-07-20 IMAGING — CR DG CHEST 2V
2 series · 2 of 2 positions shown · non-contrast
Comparison: none

CLINICAL DATA: Knee replacement.  Preoperative chest x-ray .

EXAM:
CHEST  2 VIEW

[w chest pa]
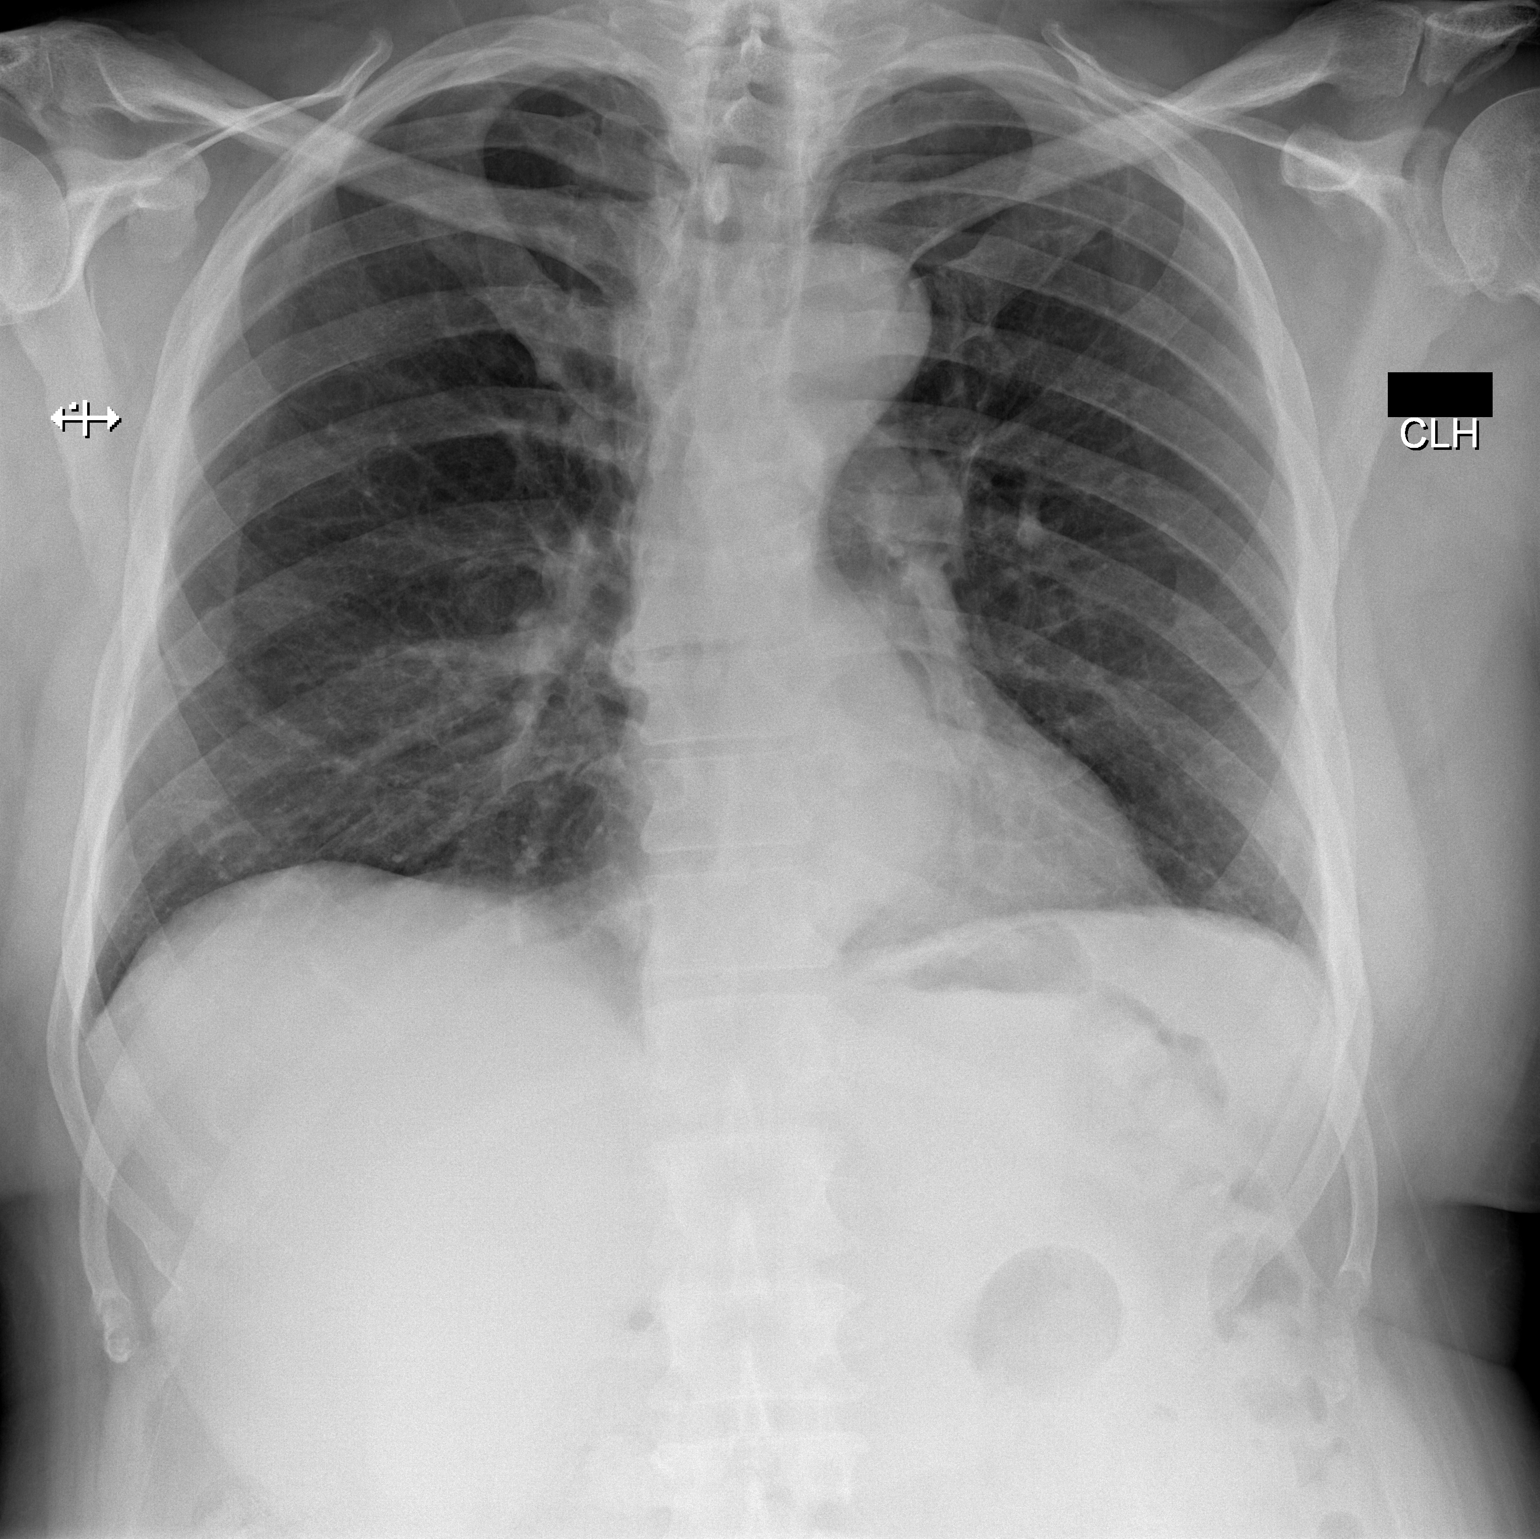

[w chest lat]
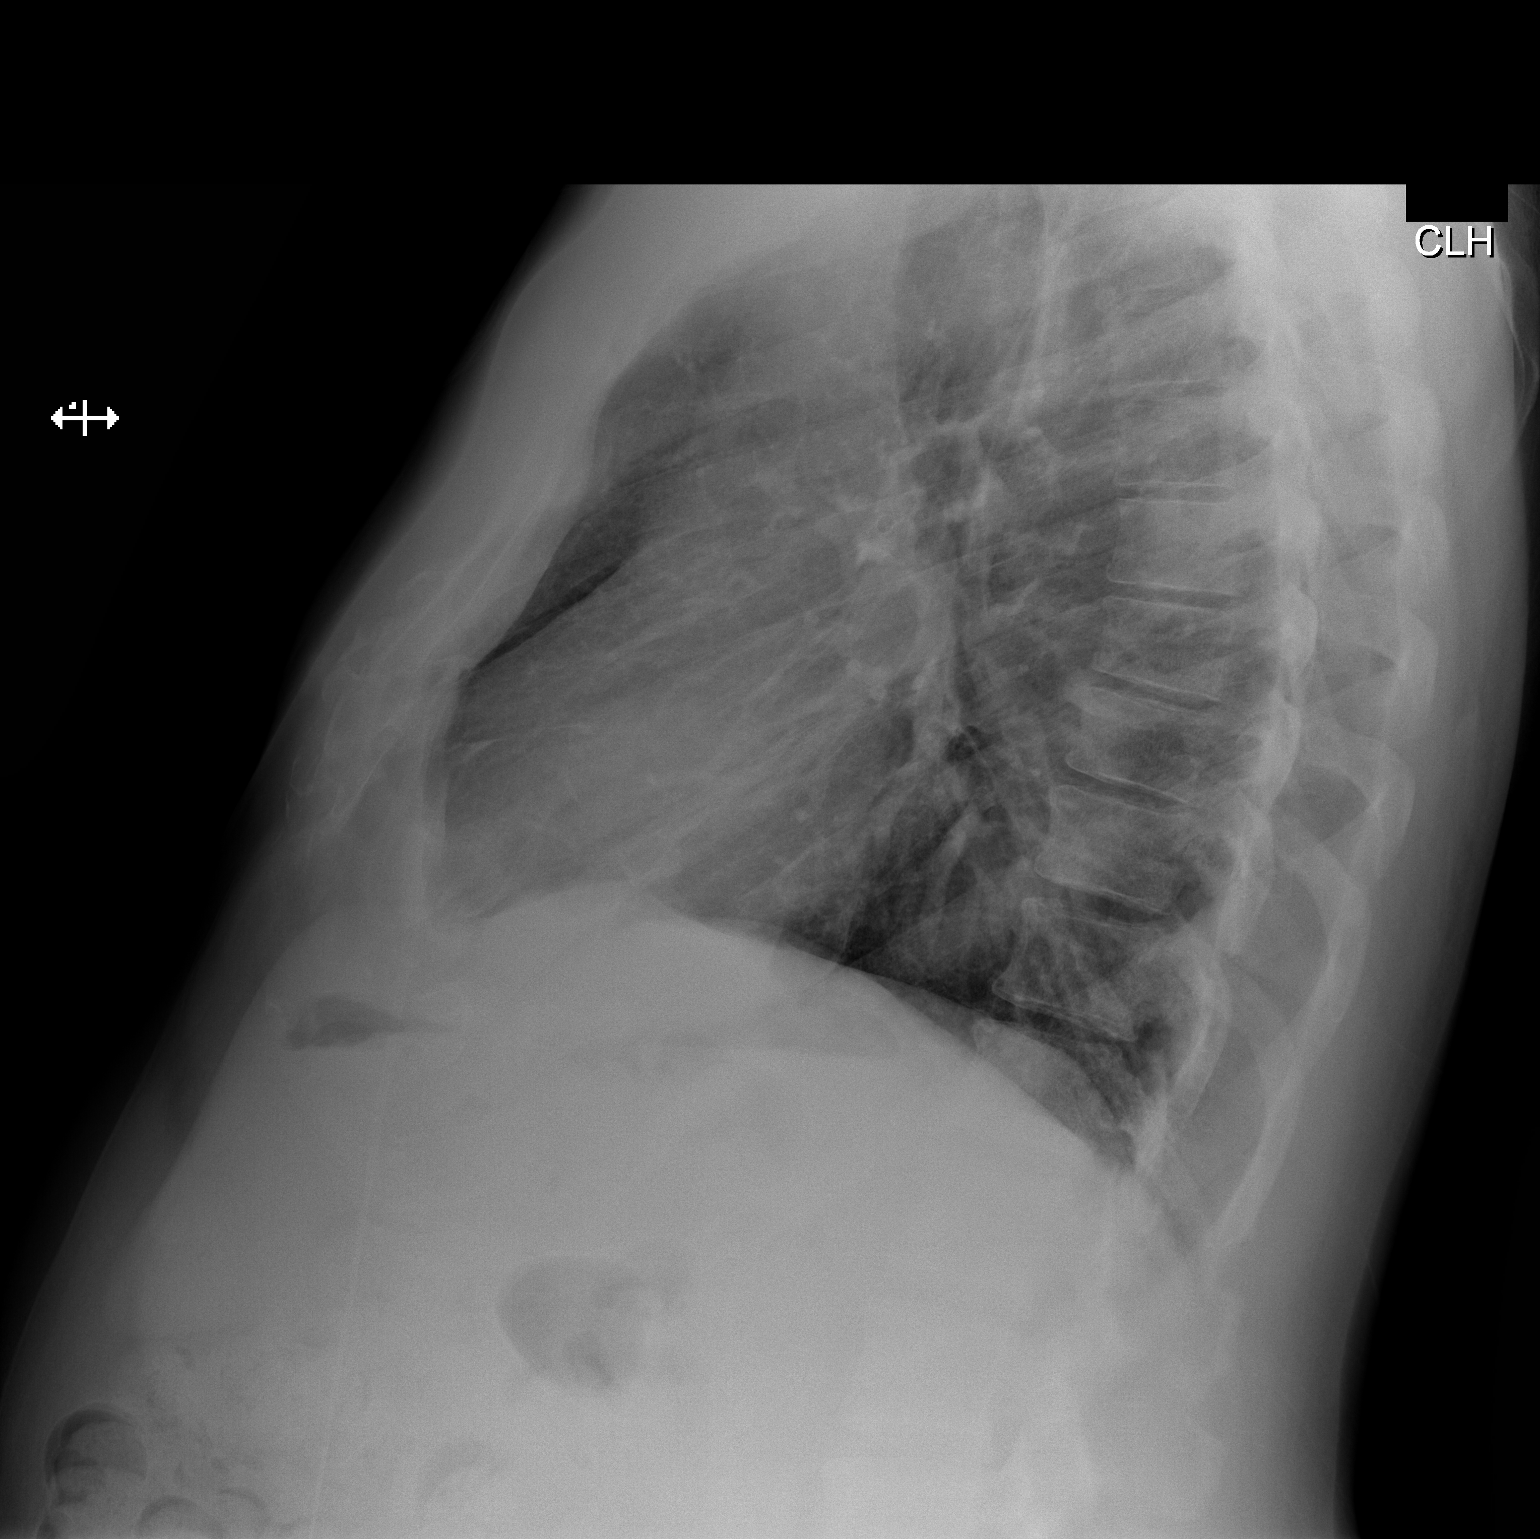

[2 of 2 positions shown; findings below may reference images not displayed]

FINDINGS: Mediastinum and hilar structures normal. Heart size normal. No focal
infiltrate. No pleural effusion or pneumothorax. Degenerative
changes thoracic spine with scoliosis .
IMPRESSION: No acute cardiopulmonary disease.

## 2018-09-10 ENCOUNTER — Other Ambulatory Visit: Payer: Self-pay | Admitting: Family Medicine

## 2018-09-10 DIAGNOSIS — J4540 Moderate persistent asthma, uncomplicated: Secondary | ICD-10-CM

## 2018-09-10 NOTE — Telephone Encounter (Signed)
Requested medication (s) are due for refill today: yes  Requested medication (s) are on the active medication list: yes  Last refill:  08/26/2018  Future visit scheduled:no  Notes to clinic:  One inhaler should last at least one month. If the patient is requesting refills earlier,    Requested Prescriptions  Pending Prescriptions Disp Refills   albuterol (VENTOLIN HFA) 108 (90 Base) MCG/ACT inhaler [Pharmacy Med Name: ALBUTEROL HFA (PROAIR) INHALER]  0    Sig: TAKE 2 PUFFS BY MOUTH EVERY 6 HOURS AS NEEDED FOR WHEEZE OR SHORTNESS OF BREATH     Pulmonology:  Beta Agonists Failed - 09/10/2018  1:42 AM      Failed - One inhaler should last at least one month. If the patient is requesting refills earlier, contact the patient to check for uncontrolled symptoms.      Passed - Valid encounter within last 12 months    Recent Outpatient Visits          2 months ago Gastroesophageal reflux disease without esophagitis   [facility] Steele Sizer, MD   7 months ago Cough   [place], NP   9 months ago Primary osteoarthritis of left hip   [facility] Steele Sizer, MD   12 months ago Encounter for routine history and physical exam for male   [facility] Steele Sizer, MD   1 year ago Dry cough   [facility] Steele Sizer, MD

## 2018-10-12 ENCOUNTER — Other Ambulatory Visit: Payer: Self-pay | Admitting: Family Medicine

## 2018-10-12 DIAGNOSIS — M545 Low back pain, unspecified: Secondary | ICD-10-CM

## 2018-10-12 NOTE — Telephone Encounter (Signed)
Requested medication (s) are due for refill today: yes  Requested medication (s) are on the active medication list: yes  Last refill:  09/14/2018  Future visit scheduled: yes  Notes to clinic:  Refill cannot be delegated    Requested Prescriptions  Pending Prescriptions Disp Refills   tiZANidine (ZANAFLEX) 2 MG tablet [Pharmacy Med Name: TIZANIDINE HCL 2 MG TABLET] 90 tablet 5    Sig: Take 1 tablet (2 mg total) by mouth 3 (three) times daily.     Not Delegated - Cardiovascular:  Alpha-2 Agonists - tizanidine Failed - 10/12/2018  1:35 AM      Failed - This refill cannot be delegated      Passed - Valid encounter within last 6 months    Recent Outpatient Visits          3 months ago Gastroesophageal reflux disease without esophagitis   [facility] Steele Sizer, MD   8 months ago Cough   [place], NP   10 months ago Primary osteoarthritis of left hip   [facility] Steele Sizer, MD   1 year ago Encounter for routine history and physical exam for male   [facility] Steele Sizer, MD   1 year ago Dry cough   [facility] Steele Sizer, MD      Future Appointments            In 2 months Steele Sizer, MD [facility], [facility]

## 2018-10-28 DIAGNOSIS — N1831 Chronic kidney disease, stage 3a: Secondary | ICD-10-CM | POA: Diagnosis not present

## 2018-10-28 DIAGNOSIS — D631 Anemia in chronic kidney disease: Secondary | ICD-10-CM | POA: Diagnosis not present

## 2018-10-28 DIAGNOSIS — E875 Hyperkalemia: Secondary | ICD-10-CM | POA: Diagnosis not present

## 2018-10-28 DIAGNOSIS — N2581 Secondary hyperparathyroidism of renal origin: Secondary | ICD-10-CM | POA: Diagnosis not present

## 2018-10-29 ENCOUNTER — Telehealth: Payer: Self-pay | Admitting: Family Medicine

## 2018-10-29 ENCOUNTER — Encounter: Payer: Self-pay | Admitting: Family Medicine

## 2018-10-29 NOTE — Chronic Care Management (AMB) (Signed)
°  Chronic Care Management   Outreach Note  10/29/2018 Name: [name] MRN: [id number] DOB: 1950/12/25  Referred by: Steele Sizer, MD Reason for referral : Chronic Care Management (Initial CCM outreach was unsuccessful.)   An unsuccessful telephone outreach was attempted today. The patient was referred to the case management team by for assistance with care management and care coordination.   Follow Up Plan: The care management team will reach out to the patient again over the next 7 days.   [place]  [email or web address]   ??[phone]

## 2018-11-02 ENCOUNTER — Other Ambulatory Visit: Payer: Self-pay | Admitting: Family Medicine

## 2018-11-02 DIAGNOSIS — J302 Other seasonal allergic rhinitis: Secondary | ICD-10-CM

## 2018-11-03 NOTE — Chronic Care Management (AMB) (Signed)
Chronic Care Management   Note  11/03/2018 Name: [name] MRN: [id number] DOB: [birth year]  [name] is a 68 y.o. year old male who is a primary care patient of Steele Sizer, MD. I reached out to [employer] by phone today in response to a referral sent by [name]'s health plan.     [name] was given information about Chronic Care Management services today including:  1. CCM service includes personalized support from designated clinical staff supervised by his physician, including individualized plan of care and coordination with other care providers 2. 24/7 contact phone numbers for assistance for urgent and routine care needs. 3. Service will only be billed when office clinical staff spend 20 minutes or more in a month to coordinate care. 4. Only one practitioner may furnish and bill the service in a calendar month. 5. The patient may stop CCM services at any time (effective at the end of the month) by phone call to the office staff. 6. The patient will be responsible for cost sharing (co-pay) of up to 20% of the service fee (after annual deductible is met).  Patient did not agree to enrollment in care management services and does not wish to consider at this time.  Follow up plan: The patient has been provided with contact information for the chronic care management team and has been advised to call with any health related questions or concerns.   [place]  [email or web address]   ??[phone]

## 2018-12-09 ENCOUNTER — Telehealth: Payer: Self-pay | Admitting: Family Medicine

## 2018-12-09 NOTE — Telephone Encounter (Signed)
Wife calling to request the Rx for  Allegra D 24 hr be sent to the    [place], [place] [phone] (Phone) [phone] (Fax)   She states [facility] wants [facility] for [facility] Rx, and it only has one refill that will expire 12/20/18.  She would like that sent to the [facility] please

## 2018-12-10 MED ORDER — FEXOFENADINE HCL 180 MG PO TABS: 180.0000 mg | ORAL_TABLET | Freq: Every day | ORAL | 0 refills | Status: DC | PRN

## 2018-12-13 NOTE — Telephone Encounter (Signed)
Patient's wife called again stating that script was supposed to go to the [facility]. Please advise.

## 2018-12-13 NOTE — Telephone Encounter (Signed)
Cancelled Alleg[facility] at [facility] and called in verbal prescription of Allegra to [facility] for patient.

## 2018-12-14 NOTE — Telephone Encounter (Signed)
Pt's wife calling.  States that the RX that needed to be called in to [place] was Allegra D 24 hour.  States that the regular Allegra was called in, they need it to say Allegra D 24 hour.  Pt's wife would like a call back when this has been done so she will know to go pick it up. Hilda Blades can be reached at [phone].

## 2018-12-14 NOTE — Telephone Encounter (Signed)
Spoke with Neoma [place] regarding Allegra-D and informed her per Dr. Ancil Boozer: Delma Freeze D is not safe for long term use and can cause elevation of bp, I only recommend it for a few days and a rx would not be appropriate    Patient wife states he has been taking Allegra D for years and has had no trouble with it raising his BP. Just that his prescription was sent to the wrong [facility] of [facility]. Informed wife after seeing the prescription of "Allegra" 180 mg  Was [facility]. I did call [facility] and cancel prescription and rerouted verbal order per Dr. Ancil Boozer Allegra 180 mg to [facility] for patient. If patient has a dispute regarding long term use of Allegra D he should discuss this with her during his appointment tomorrow 12/15/2018 at 8 a.m. with Dr. Ancil Boozer.

## 2018-12-15 ENCOUNTER — Encounter: Payer: Self-pay | Admitting: Family Medicine

## 2018-12-15 ENCOUNTER — Other Ambulatory Visit: Payer: Self-pay

## 2018-12-15 ENCOUNTER — Ambulatory Visit (INDEPENDENT_AMBULATORY_CARE_PROVIDER_SITE_OTHER): Payer: Medicare Other | Admitting: Family Medicine

## 2018-12-15 VITALS — Wt 287.0 lb

## 2018-12-15 DIAGNOSIS — N138 Other obstructive and reflux uropathy: Secondary | ICD-10-CM | POA: Diagnosis not present

## 2018-12-15 DIAGNOSIS — E785 Hyperlipidemia, unspecified: Secondary | ICD-10-CM | POA: Diagnosis not present

## 2018-12-15 DIAGNOSIS — J4540 Moderate persistent asthma, uncomplicated: Secondary | ICD-10-CM

## 2018-12-15 DIAGNOSIS — N401 Enlarged prostate with lower urinary tract symptoms: Secondary | ICD-10-CM

## 2018-12-15 DIAGNOSIS — K219 Gastro-esophageal reflux disease without esophagitis: Secondary | ICD-10-CM | POA: Diagnosis not present

## 2018-12-15 DIAGNOSIS — I700 Atherosclerosis of aorta: Secondary | ICD-10-CM | POA: Diagnosis not present

## 2018-12-15 DIAGNOSIS — N183 Chronic kidney disease, stage 3 unspecified: Secondary | ICD-10-CM | POA: Diagnosis not present

## 2018-12-15 DIAGNOSIS — I10 Essential (primary) hypertension: Secondary | ICD-10-CM

## 2018-12-15 DIAGNOSIS — I82562 Chronic embolism and thrombosis of left calf muscular vein: Secondary | ICD-10-CM

## 2018-12-15 DIAGNOSIS — J301 Allergic rhinitis due to pollen: Secondary | ICD-10-CM

## 2018-12-15 DIAGNOSIS — D638 Anemia in other chronic diseases classified elsewhere: Secondary | ICD-10-CM

## 2018-12-15 MED ORDER — TADALAFIL 5 MG PO TABS: 5.0000 mg | ORAL_TABLET | Freq: Every day | ORAL | 5 refills | Status: DC

## 2018-12-15 MED ORDER — ATORVASTATIN CALCIUM 20 MG PO TABS: 20.0000 mg | ORAL_TABLET | Freq: Every day | ORAL | 5 refills | Status: DC

## 2018-12-15 MED ORDER — FEXOFENADINE-PSEUDOEPHED ER 180-240 MG PO TB24: 1.0000 | ORAL_TABLET | Freq: Every day | ORAL | 2 refills | Status: DC | PRN

## 2018-12-15 MED ORDER — FAMOTIDINE 20 MG PO TABS: 20.0000 mg | ORAL_TABLET | Freq: Two times a day (BID) | ORAL | 5 refills | Status: DC | PRN

## 2018-12-15 MED ORDER — TAMSULOSIN HCL 0.4 MG PO CAPS: 0.4000 mg | ORAL_CAPSULE | Freq: Every evening | ORAL | 5 refills | Status: DC

## 2018-12-15 MED ORDER — MONTELUKAST SODIUM 10 MG PO TABS: 10.0000 mg | ORAL_TABLET | Freq: Every day | ORAL | 5 refills | Status: DC

## 2018-12-15 MED ORDER — BREO ELLIPTA 200-25 MCG/INH IN AEPB: 1.0000 | INHALATION_SPRAY | Freq: Every day | RESPIRATORY_TRACT | 5 refills | Status: DC

## 2018-12-15 MED ORDER — FLUTICASONE PROPIONATE 50 MCG/ACT NA SUSP: 2.0000 | Freq: Every day | NASAL | 5 refills | Status: DC

## 2018-12-15 MED ORDER — APIXABAN 5 MG PO TABS: 5.0000 mg | ORAL_TABLET | Freq: Two times a day (BID) | ORAL | 5 refills | Status: DC

## 2018-12-15 NOTE — Progress Notes (Signed)
Name: [name]   MRN: [id number]    DOB: 12/08/1950   Date:12/15/2018       Progress Note  Subjective  Chief Complaint  Chief Complaint  Patient presents with  . Hypertension  . Gastroesophageal Reflux  . Asthma  . Medication Refill    Concerns about getting prescription for Allegra D. He has serious allergies. He perfers to take Allegra D instead of Allegra.    I connected with  Isaias Bozard Lubrano  on 12/15/18 at  8:00 AM EST by a video enabled telemedicine application and verified that I am speaking with the correct person using two identifiers.  I discussed the limitations of evaluation and management by telemedicine and the availability of in person appointments. The patient expressed understanding and agreed to proceed. Staff also discussed with the patient that there may be a patient responsible charge related to this service. Patient Location: at work  Provider Location: [facility]   HPI  Obese: he was up to 303 lbs May 2020 at home today he is down to 287 lbs and is trying to eat healthier   Asthma Moderate: he is using [employer] daily, Proairprn onlyat most once a week usually when moderate physical activity . Unchanged   History of anemia: but seen by hematologist level back in Nov 2019 and Hgb  was  11.5 . Had blood transfusion  after his left hip replacement surgery Dec 2020. His last hbg at hospital prior to discharge was was 9, labs done June 2020 showed Hbg of 12.4 ( still low) and high iron storage, explained anemia of chronic disease, may stop taking iron supplementation and we will recheck it next visit when he comes in person   CKI stage III: and hyperkalemia, under the care of Dr. Holley Raring. No pruritus.Good urine outputPer patient his last level was slightly low, but fluctuates , last GFR  Was 47 - done by Dr. Holley Raring . CKI III stage 3a  Dyslipidemia: he is not on medication, low HDl reviewed labs with patient,   Explained he has atherosclerosis of aorta . He is taking Atorvastatin and is doing well, it was a remote visit and we will hold off on labs at this time  Chronic DVT: on Eliquis, no side effects of medication , no easy bruising. No leg pain or swelling  with ambulation  GERD and eructation: he continues to have bloating and burping, mild GERD, taking Pepcid, advised to stop drinking from a straw, not to chew gum on his last visit and he is doing better now  Patient Active Problem List   Diagnosis Date Noted  . Primary osteoarthritis of left hip 12/14/2017  . Chronic deep vein thrombosis (DVT) of calf muscle vein of left lower extremity ([place]) 11/30/2017  . 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 Atherosclerosis of abdominal aorta ([place]) 09/15/2017  . Chronic deep vein thrombosis (DVT) of left popliteal vein ([place]) 06/26/2017  . Primary osteoarthritis of left knee 12/19/2016  . Anemia, unspecified 04/30/2016  . History of benign neoplasm of adrenal gland 02/11/2016  . History of iron deficiency anemia 10/10/2015  . BPH (benign prostatic hyperplasia) 06/20/2015  . ED (erectile dysfunction) 06/20/2015  . Allergic rhinitis, seasonal 06/20/2015  . Anemia of chronic disease 06/20/2015  . Hypogonadism in male 06/20/2015  . Asthma, well controlled, moderate persistent 06/20/2015  . Hypertension, benign 06/20/2015  . Hyperglycemia 06/20/2015  . History of shingles 06/20/2015  . GERD without esophagitis 06/20/2015  . Chronic  radicular low back pain 06/20/2015  . History of epilepsy 06/20/2015  . Migraine without aura and without status migrainosus, not intractable 06/20/2015  . Dyslipidemia 06/20/2015  . Primary osteoarthritis of both knees 06/20/2015    Past Surgical History:  Procedure Laterality Date  . ADRENALECTOMY Left 02/11/2016   [facility]  . COLONOSCOPY  02/2012   normal  . JOINT REPLACEMENT    . KNEE ARTHROSCOPY Left 10/06/2009  . SINUS EXPLORATION    . TOTAL  HIP ARTHROPLASTY Left 12/14/2017   Procedure: TOTAL HIP ARTHROPLASTY ANTERIOR APPROACH;  Surgeon: Dorna Leitz, MD;  Location: [place];  Service: Orthopedics;  Laterality: Left;  . TOTAL KNEE ARTHROPLASTY Right 06/20/2016  . TOTAL KNEE ARTHROPLASTY Right 06/20/2016   Procedure: TOTAL KNEE ARTHROPLASTY;  Surgeon: Dorna Leitz, MD;  Location: [place];  Service: Orthopedics;  Laterality: Right;  . TOTAL KNEE ARTHROPLASTY Left 12/19/2016   Procedure: LEFT TOTAL KNEE ARTHROPLASTY;  Surgeon: Dorna Leitz, MD;  Location: [facility];  Service: Orthopedics;  Laterality: Left;  Adductor Block    Family History  Problem Relation Age of Onset  . Diabetes Mother   . Heart disease Mother   . Lung disease Mother   . Seizures Maternal Grandmother   . Alzheimer's disease Brother     Social History   Socioeconomic History  . Marital status: Married    Spouse name: deborah  . Number of children: 2  . Years of education: Not on file  . Highest education level: Professional school degree (e.g., MD, DDS, DVM, JD)  Occupational History  . Occupation: [employer]  Social Needs  . Financial resource strain: Not hard at all  . Food insecurity    Worry: Never true    Inability: Never true  . Transportation needs    Medical: No    Non-medical: No  Tobacco Use  . Smoking status: Former Smoker    Packs/day: 1.00    Years: 10.00    Pack years: 10.00  . Smokeless tobacco: Never Used  . Tobacco comment: 38 years ago 49 when he stopped  Substance and Sexual Activity  . Alcohol use: No    Alcohol/week: 0.0 standard drinks  . Drug use: No  . Sexual activity: Yes    Partners: Female  Lifestyle  . Physical activity    Days per week: 5 days    Minutes per session: 30 min  . Stress: Not at all  Relationships  . Social Herbalist on phone: Twice a week    Gets together: Three times a week    Attends religious service: More than 4 times per year    Active member of club or organization:  Yes    Attends meetings of clubs or organizations: More than 4 times per year    Relationship status: Married  . Intimate partner violence    Fear of current or ex partner: No    Emotionally abused: No    Physically abused: No    Forced sexual activity: No  Other Topics Concern  . Not on file  Social History Narrative   Patient has 8 grands     Current Outpatient Medications:  .  albuterol (VENTOLIN HFA) 108 (90 Base) MCG/ACT inhaler, TAKE 2 PUFFS BY MOUTH EVERY 6 HOURS AS NEEDED FOR WHEEZE OR SHORTNESS OF BREATH, Disp: 18 g, Rfl: 0 .  apixaban (ELIQUIS) 5 MG TABS tablet, Take 1 tablet (5 mg total) by mouth 2 (two) times daily., Disp: 60 tablet,  Rfl: 5 .  atorvastatin (LIPITOR) 20 MG tablet, Take 1 tablet (20 mg total) by mouth daily., Disp: 30 tablet, Rfl: 5 .  azelastine (OPTIVAR) 0.05 % ophthalmic solution, INSTILL 2 DROPS INTO BOTH EYES TWICE A DAY, Disp: 6 mL, Rfl: 5 .  BREO ELLIPTA 200-25 MCG/INH AEPB, Inhale 1 puff into the lungs daily., Disp: 60 each, Rfl: 5 .  cholecalciferol (VITAMIN D3) 25 MCG (1000 UT) tablet, Take 1,000 Units by mouth daily., Disp: , Rfl:  .  famotidine (PEPCID) 20 MG tablet, Take 1 tablet (20 mg total) by mouth 2 (two) times daily as needed for heartburn or indigestion., Disp: 60 tablet, Rfl: 5 .  fexofenadine-pseudoephedrine (ALLEGRA-D 24) 180-240 MG 24 hr tablet, Take 1 tablet by mouth daily as needed (for allergies.)., Disp: 30 tablet, Rfl: 2 .  fluticasone (FLONASE) 50 MCG/ACT nasal spray, Place 2 sprays into both nostrils daily., Disp: 16 g, Rfl: 5 .  Glucosamine-MSM-Hyaluronic Acd (JOINT HEALTH PO), Take 1 tablet by mouth daily. INSTAFLEX ADVANCED JOINT SUPPORT, Disp: , Rfl:  .  Lactobacillus Rhamnosus, GG, (CULTURELLE) CAPS, Take 1 capsule by mouth daily. , Disp: , Rfl:  .  Misc Natural Products (GINSENG COMPLEX PO), Take 1 tablet by mouth daily., Disp: , Rfl:  .  Misc Natural Products (NF FORMULAS TESTOSTERONE PO), Take 2 tablets by mouth daily.  NATURE'S PLUS T-MALE SUPPLEMENT, Disp: , Rfl:  .  montelukast (SINGULAIR) 10 MG tablet, Take 1 tablet (10 mg total) by mouth daily., Disp: 30 tablet, Rfl: 5 .  Multiple Vitamins-Minerals (MULTIVITAMIN ADULTS 50+ PO), Take 1 tablet by mouth daily. NATURE'S CODE MEN OVER 50 MULTIVITAMIN PACK , Disp: , Rfl:  .  OVER THE COUNTER MEDICATION, Take 1 tablet by mouth 3 (three) times daily. TEST-HD TESTOSTERONE SUPPORT , Disp: , Rfl:  .  patiromer (VELTASSA) 8.4 g packet, Take 8.4 g by mouth daily. , Disp: , Rfl:  .  Polyethyl Glycol-Propyl Glycol (SYSTANE ULTRA) 0.4-0.3 % SOLN, Place 1-2 drops into both eyes 3 (three) times daily as needed (for dry/irritated eyes.)., Disp: , Rfl:  .  tadalafil (CIALIS) 5 MG tablet, Take 1 tablet (5 mg total) by mouth daily., Disp: 30 tablet, Rfl: 5 .  tamsulosin (FLOMAX) 0.4 MG CAPS capsule, Take 1 capsule (0.4 mg total) by mouth every evening., Disp: 30 capsule, Rfl: 5 .  tiZANidine (ZANAFLEX) 2 MG tablet, TAKE 1 TABLET (2 MG TOTAL) BY MOUTH 3 (THREE) TIMES DAILY., Disp: 90 tablet, Rfl: 1 .  vitamin B-12 (CYANOCOBALAMIN) 500 MCG tablet, Take 500 mcg by mouth daily., Disp: , Rfl:   Allergies  Allergen Reactions  . Almond (Diagnostic) Other (See Comments)    Migraines  . Lactose Intolerance (Gi) Other (See Comments)    MIGRAINES  . Peanut-Containing Drug Products Other (See Comments)    Migraines  . Shellfish Allergy Other (See Comments)    Congestion/breathing problems/migraines.    I personally reviewed active problem list, medication list, allergies, family history, social history, health maintenance with the patient/caregiver today.   ROS  Ten systems reviewed and is negative except as mentioned in HPI   Objective  Virtual encounter, vitals not obtained.  Body mass index is 38.92 kg/m.  Physical Exam  Awake, alert and oriented   PHQ2/9: Depression screen [facility] 2/9 12/15/2018 06/22/2018 01/28/2018 09/15/2017 09/15/2017  Decreased Interest 0 0 0 0 0   Down, Depressed, Hopeless 0 0 1 0 0  PHQ - 2 Score 0 0 1 0 0  Altered sleeping 0  0 - 0 -  Tired, decreased energy 0 0 - 1 -  Change in appetite 0 0 - 0 -  Feeling bad or failure about yourself  0 0 - 0 -  Trouble concentrating 0 0 - 0 -  Moving slowly or fidgety/restless 0 0 - 0 -  Suicidal thoughts 0 0 - 0 -  PHQ-9 Score 0 0 - 1 -  Difficult doing work/chores - - - Not difficult at all -   PHQ-2/9 Result is negative.    Fall Risk: Fall Risk  12/15/2018 06/22/2018 01/28/2018 11/30/2017 09/15/2017  Falls in the past year? 0 0 0 0 Yes  Number falls in past yr: 0 0 0 - 2 or more  Injury with Fall? 0 0 0 - No     Assessment & Plan  1. Dyslipidemia  Continue Atorvastatin   2. Chronic deep vein thrombosis (DVT) of calf muscle vein of left lower extremity (HCC)  - apixaban (ELIQUIS) 5 MG TABS tablet; Take 1 tablet (5 mg total) by mouth 2 (two) times daily.  Dispense: 60 tablet; Refill: 5  3. Seasonal allergic rhinitis due to pollen  - fexofenadine-pseudoephedrine (ALLEGRA-D 24) 180-240 MG 24 hr tablet; Take 1 tablet by mouth daily as needed (for allergies.).  Dispense: 30 tablet; Refill: 2 - fluticasone (FLONASE) 50 MCG/ACT nasal spray; Place 2 sprays into both nostrils daily.  Dispense: 16 g; Refill: 5  4. Gastroesophageal reflux disease without esophagitis  - famotidine (PEPCID) 20 MG tablet; Take 1 tablet (20 mg total) by mouth 2 (two) times daily as needed for heartburn or indigestion.  Dispense: 60 tablet; Refill: 5  5. Chronic kidney insufficiency, stage 3 (moderate)  Continue follow up with Dr. Holley Raring   6. Asthma, well controlled, moderate persistent  - BREO ELLIPTA 200-25 MCG/INH AEPB; Inhale 1 puff into the lungs daily.  Dispense: 60 each; Refill: 5 - montelukast (SINGULAIR) 10 MG tablet; Take 1 tablet (10 mg total) by mouth daily.  Dispense: 30 tablet; Refill: 5  7. Anemia of chronic disease  stable  8. Hypertension, benign  At goal   9. Atherosclerosis of  abdominal aorta (HCC)  - atorvastatin (LIPITOR) 20 MG tablet; Take 1 tablet (20 mg total) by mouth daily.  Dispense: 30 tablet; Refill: 5  10. Benign prostatic hyperplasia with urinary obstruction  Doing well   - tamsulosin (FLOMAX) 0.4 MG CAPS capsule; Take 1 capsule (0.4 mg total) by mouth every evening.  Dispense: 30 capsule; Refill: 5 - tadalafil (CIALIS) 5 MG tablet; Take 1 tablet (5 mg total) by mouth daily.  Dispense: 30 tablet; Refill: 5  I discussed the assessment and treatment plan with the patient. The patient was provided an opportunity to ask questions and all were answered. The patient agreed with the plan and demonstrated an understanding of the instructions.  The patient was advised to call back or seek an in-person evaluation if the symptoms worsen or if the condition fails to improve as anticipated.  I provided 25  minutes of non-face-to-face time during this encounter.

## 2019-01-06 ENCOUNTER — Other Ambulatory Visit: Payer: Self-pay | Admitting: Family Medicine

## 2019-01-06 DIAGNOSIS — J4540 Moderate persistent asthma, uncomplicated: Secondary | ICD-10-CM

## 2019-01-25 IMAGING — CR DG CHEST 2V
2 series · 2 of 2 positions shown · non-contrast
Comparison: Chest radiograph 06/09/2016

CLINICAL DATA: Preoperative evaluation.

EXAM:
CHEST  2 VIEW

[w chest pa]
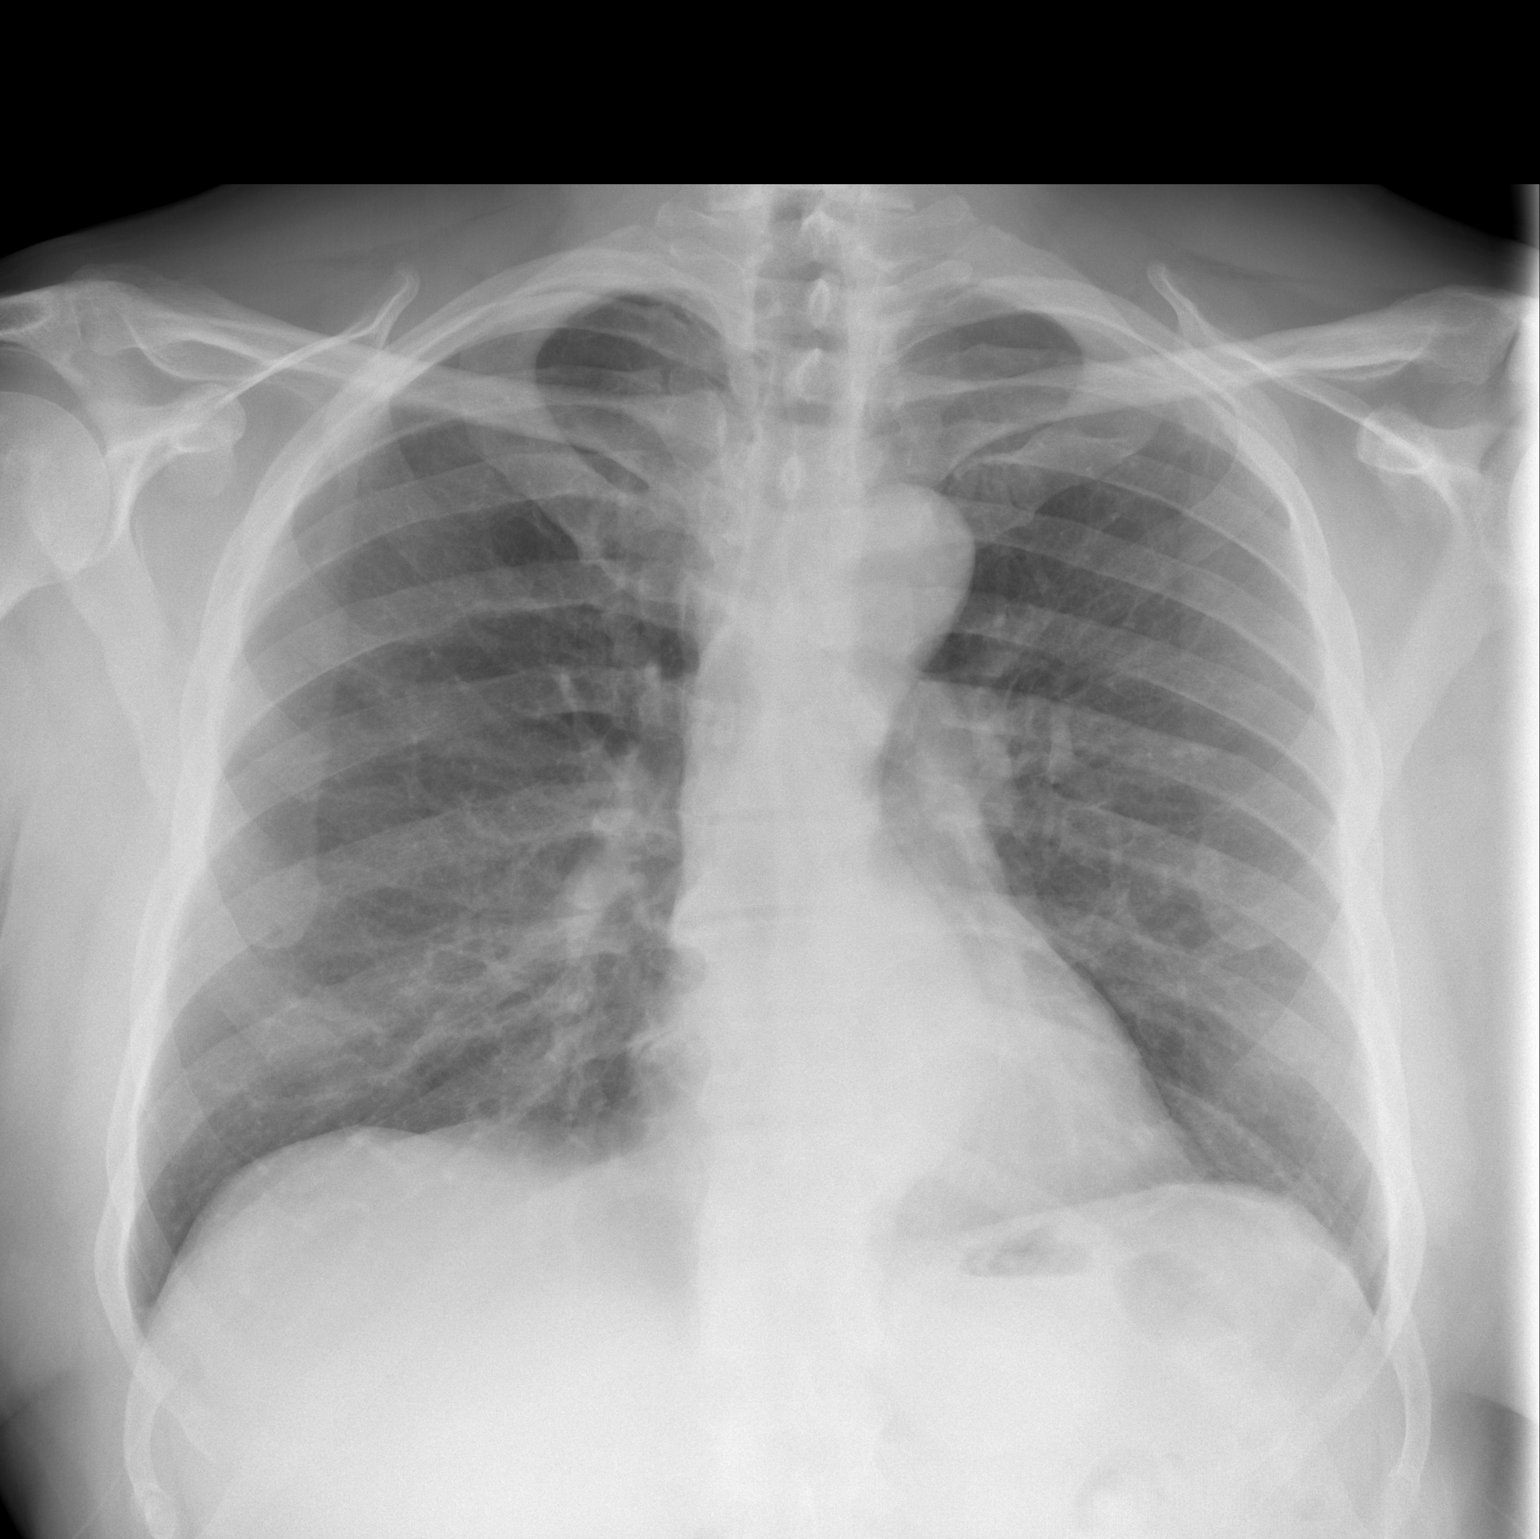

[w chest lat]
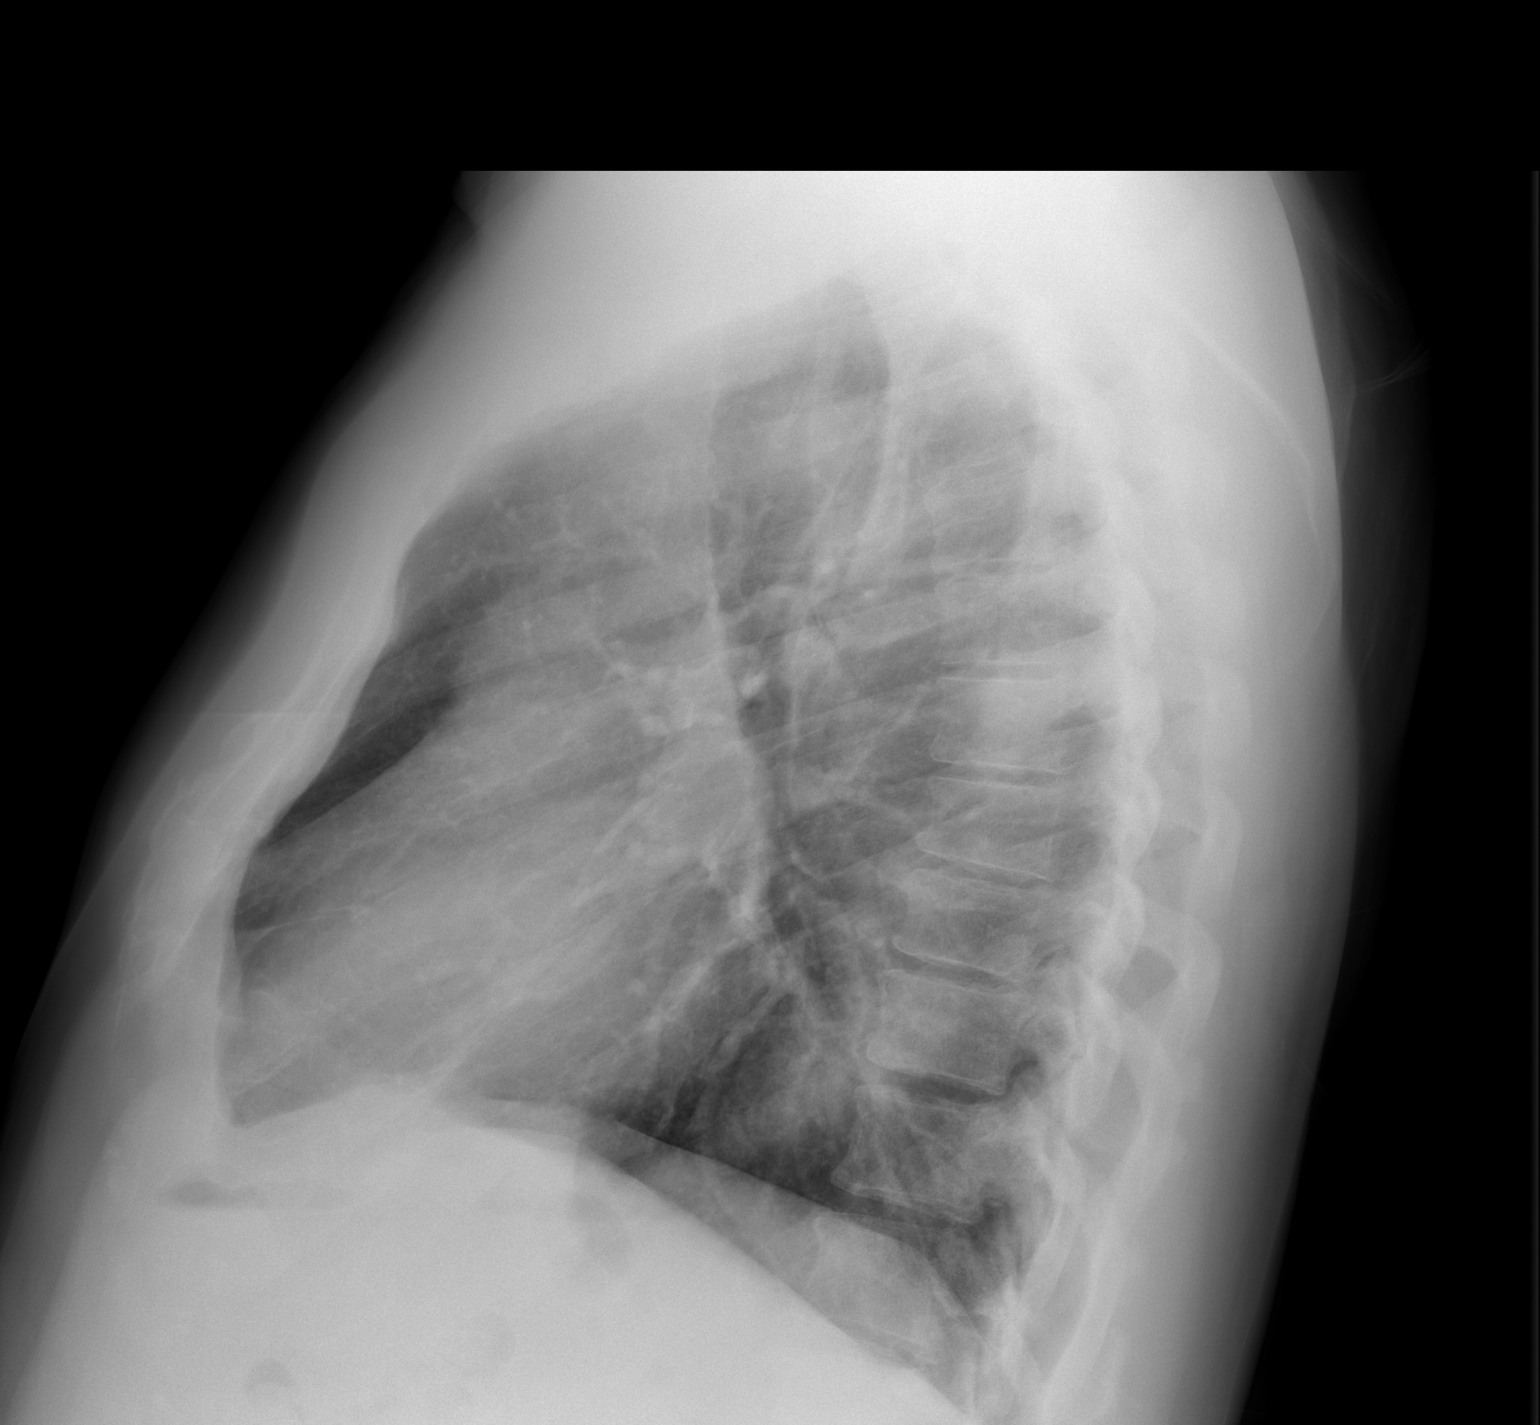

[2 of 2 positions shown; findings below may reference images not displayed]

FINDINGS: Normal cardiac contours. Tortuosity of the thoracic aorta. No large
area pulmonary consolidation. No pleural effusion or pneumothorax.
Thoracic spine degenerative changes.
IMPRESSION: No acute cardiopulmonary process.

## 2019-02-18 ENCOUNTER — Other Ambulatory Visit: Payer: Self-pay | Admitting: Family Medicine

## 2019-02-18 DIAGNOSIS — M545 Low back pain, unspecified: Secondary | ICD-10-CM

## 2019-02-18 NOTE — Telephone Encounter (Signed)
Notes to clinic:  This Rx is not able to be delegated.    Requested Prescriptions  Pending Prescriptions Disp Refills   tiZANidine (ZANAFLEX) 2 MG tablet [Pharmacy Med Name: TIZANIDINE HCL 2 MG TABLET] 90 tablet 1    Sig: TAKE 1 TABLET (2 MG TOTAL) BY MOUTH 3 (THREE) TIMES DAILY.      Not Delegated - Cardiovascular:  Alpha-2 Agonists - tizanidine Failed - 02/18/2019  2:11 AM      Failed - This refill cannot be delegated      Passed - Valid encounter within last 6 months    Recent Outpatient Visits           2 months ago Atherosclerosis of abdominal aorta [facility])   [facility] Steele Sizer, MD   8 months ago Gastroesophageal reflux disease without esophagitis   [facility] Steele Sizer, MD   1 year ago Cough   [place], NP   1 year ago Primary osteoarthritis of left hip   [facility] Steele Sizer, MD   1 year ago Encounter for routine history and physical exam for male   [facility] Steele Sizer, MD       Future Appointments             In 3 months Ancil Boozer, Drue Stager, MD [facility], [place]   In 4 months Steele Sizer, MD [facility], [facility]

## 2019-03-03 DIAGNOSIS — N2581 Secondary hyperparathyroidism of renal origin: Secondary | ICD-10-CM | POA: Diagnosis not present

## 2019-03-03 DIAGNOSIS — N1831 Chronic kidney disease, stage 3a: Secondary | ICD-10-CM | POA: Diagnosis not present

## 2019-03-03 DIAGNOSIS — D631 Anemia in chronic kidney disease: Secondary | ICD-10-CM | POA: Diagnosis not present

## 2019-03-03 DIAGNOSIS — E875 Hyperkalemia: Secondary | ICD-10-CM | POA: Diagnosis not present

## 2019-04-05 ENCOUNTER — Telehealth: Payer: Self-pay | Admitting: Family Medicine

## 2019-04-05 NOTE — Telephone Encounter (Signed)
Left message for patient to schedule Annual Wellness Visit.  Please schedule with Nurse Health Advisor Victoria Britt, RN at [place]  

## 2019-04-21 ENCOUNTER — Telehealth: Payer: Self-pay | Admitting: Family Medicine

## 2019-04-21 NOTE — Telephone Encounter (Signed)
Patient declined to do the [place]

## 2019-05-29 ENCOUNTER — Other Ambulatory Visit: Payer: Self-pay | Admitting: Family Medicine

## 2019-05-29 DIAGNOSIS — M545 Low back pain, unspecified: Secondary | ICD-10-CM

## 2019-05-29 NOTE — Telephone Encounter (Signed)
Requested medication (s) are due for refill today: yes  Requested medication (s) are on the active medication list: yes  Last refill:  02/17/18  Future visit scheduled: no  Notes to clinic:  Med not delegated to NT   Requested Prescriptions  Pending Prescriptions Disp Refills   tiZANidine (ZANAFLEX) 2 MG tablet [Pharmacy Med Name: TIZANIDINE HCL 2 MG TABLET] 90 tablet 1    Sig: TAKE 1 TABLET (2 MG TOTAL) BY MOUTH 3 (THREE) TIMES DAILY.      Not Delegated - Cardiovascular:  Alpha-2 Agonists - tizanidine Failed - 05/29/2019  9:49 AM      Failed - This refill cannot be delegated      Passed - Valid encounter within last 6 months    Recent Outpatient Visits           5 months ago Atherosclerosis of abdominal aorta [facility])   [facility] Steele Sizer, MD   11 months ago Gastroesophageal reflux disease without esophagitis   [facility] Steele Sizer, MD   1 year ago Cough   [place], NP   1 year ago Primary osteoarthritis of left hip   [facility] Steele Sizer, MD   1 year ago Encounter for routine history and physical exam for male   [facility] Steele Sizer, MD       Future Appointments             In 2 weeks Steele Sizer, MD [facility], [facility]

## 2019-06-13 DIAGNOSIS — M67911 Unspecified disorder of synovium and tendon, right shoulder: Secondary | ICD-10-CM | POA: Diagnosis not present

## 2019-06-14 ENCOUNTER — Other Ambulatory Visit: Payer: Self-pay | Admitting: Family Medicine

## 2019-06-14 DIAGNOSIS — J301 Allergic rhinitis due to pollen: Secondary | ICD-10-CM

## 2019-06-16 ENCOUNTER — Other Ambulatory Visit: Payer: Self-pay

## 2019-06-16 ENCOUNTER — Ambulatory Visit (INDEPENDENT_AMBULATORY_CARE_PROVIDER_SITE_OTHER): Payer: Medicare Other | Admitting: Family Medicine

## 2019-06-16 ENCOUNTER — Encounter: Payer: Self-pay | Admitting: Family Medicine

## 2019-06-16 VITALS — BP 110/70 | HR 103 | Temp 97.3°F | Resp 16 | Ht 74.0 in | Wt 294.7 lb

## 2019-06-16 DIAGNOSIS — I700 Atherosclerosis of aorta: Secondary | ICD-10-CM

## 2019-06-16 DIAGNOSIS — E291 Testicular hypofunction: Secondary | ICD-10-CM

## 2019-06-16 DIAGNOSIS — M159 Polyosteoarthritis, unspecified: Secondary | ICD-10-CM | POA: Insufficient documentation

## 2019-06-16 DIAGNOSIS — N138 Other obstructive and reflux uropathy: Secondary | ICD-10-CM

## 2019-06-16 DIAGNOSIS — I82562 Chronic embolism and thrombosis of left calf muscular vein: Secondary | ICD-10-CM

## 2019-06-16 DIAGNOSIS — I129 Hypertensive chronic kidney disease with stage 1 through stage 4 chronic kidney disease, or unspecified chronic kidney disease: Secondary | ICD-10-CM

## 2019-06-16 DIAGNOSIS — J302 Other seasonal allergic rhinitis: Secondary | ICD-10-CM

## 2019-06-16 DIAGNOSIS — M8949 Other hypertrophic osteoarthropathy, multiple sites: Secondary | ICD-10-CM

## 2019-06-16 DIAGNOSIS — E785 Hyperlipidemia, unspecified: Secondary | ICD-10-CM

## 2019-06-16 DIAGNOSIS — K219 Gastro-esophageal reflux disease without esophagitis: Secondary | ICD-10-CM

## 2019-06-16 DIAGNOSIS — J4540 Moderate persistent asthma, uncomplicated: Secondary | ICD-10-CM

## 2019-06-16 DIAGNOSIS — N401 Enlarged prostate with lower urinary tract symptoms: Secondary | ICD-10-CM

## 2019-06-16 DIAGNOSIS — D638 Anemia in other chronic diseases classified elsewhere: Secondary | ICD-10-CM | POA: Diagnosis not present

## 2019-06-16 DIAGNOSIS — N529 Male erectile dysfunction, unspecified: Secondary | ICD-10-CM

## 2019-06-16 DIAGNOSIS — J3089 Other allergic rhinitis: Secondary | ICD-10-CM

## 2019-06-16 DIAGNOSIS — N183 Chronic kidney disease, stage 3 unspecified: Secondary | ICD-10-CM

## 2019-06-16 DIAGNOSIS — I82532 Chronic embolism and thrombosis of left popliteal vein: Secondary | ICD-10-CM

## 2019-06-16 DIAGNOSIS — N1831 Chronic kidney disease, stage 3a: Secondary | ICD-10-CM

## 2019-06-16 DIAGNOSIS — N403 Nodular prostate with lower urinary tract symptoms: Secondary | ICD-10-CM

## 2019-06-16 LAB — LIPID PANEL
Cholesterol: 143 mg/dL (ref <200)
HDL: 44 mg/dL (ref >=40)
LDL Cholesterol (Calc): 79 mg/dL (calc)
Non-HDL Cholesterol (Calc): 99 mg/dL (calc) (ref <130)
Total CHOL/HDL Ratio: 3.3 (calc) (ref <5.0)
Triglycerides: 118 mg/dL (ref <150)

## 2019-06-16 LAB — IRON,TIBC AND FERRITIN PANEL
%SAT: 19 % (calc) — ABNORMAL LOW (ref 20–48)
Ferritin: 195 ng/mL (ref 24–380)
Iron: 53 ug/dL (ref 50–180)
TIBC: 279 mcg/dL (calc) (ref 250–425)

## 2019-06-16 LAB — COMPLETE METABOLIC PANEL WITH GFR
AG Ratio: 1.5 (calc) (ref 1.0–2.5)
ALT: 21 U/L (ref 9–46)
AST: 27 U/L (ref 10–35)
Albumin: 4.1 g/dL (ref 3.6–5.1)
Alkaline phosphatase (APISO): 82 U/L (ref 35–144)
BUN/Creatinine Ratio: 20 (calc) (ref 6–22)
BUN: 35 mg/dL — ABNORMAL HIGH (ref 7–25)
CO2: 24 mmol/L (ref 20–32)
Calcium: 9.1 mg/dL (ref 8.6–10.3)
Chloride: 105 mmol/L (ref 98–110)
Creat: 1.76 mg/dL — ABNORMAL HIGH (ref 0.70–1.25)
GFR, Est African American: 45 mL/min/{1.73_m2} — ABNORMAL LOW (ref >=60)
GFR, Est Non African American: 39 mL/min/{1.73_m2} — ABNORMAL LOW (ref >=60)
Globulin: 2.7 g/dL (calc) (ref 1.9–3.7)
Glucose, Bld: 126 mg/dL — ABNORMAL HIGH (ref 65–99)
Potassium: 4.5 mmol/L (ref 3.5–5.3)
Sodium: 138 mmol/L (ref 135–146)
Total Bilirubin: 0.6 mg/dL (ref 0.2–1.2)
Total Protein: 6.8 g/dL (ref 6.1–8.1)

## 2019-06-16 LAB — CBC WITH DIFFERENTIAL/PLATELET
Absolute Monocytes: 610 cells/uL (ref 200–950)
Basophils Absolute: 48 cells/uL (ref 0–200)
Basophils Relative: 0.9 %
Eosinophils Absolute: 201 cells/uL (ref 15–500)
Eosinophils Relative: 3.8 %
HCT: 33.5 % — ABNORMAL LOW (ref 38.5–50.0)
Hemoglobin: 11 g/dL — ABNORMAL LOW (ref 13.2–17.1)
Lymphs Abs: 1463 cells/uL (ref 850–3900)
MCH: 28.9 pg (ref 27.0–33.0)
MCHC: 32.8 g/dL (ref 32.0–36.0)
MCV: 87.9 fL (ref 80.0–100.0)
MPV: 10.6 fL (ref 7.5–12.5)
Monocytes Relative: 11.5 %
Neutro Abs: 2979 cells/uL (ref 1500–7800)
Neutrophils Relative %: 56.2 %
Platelets: 209 10*3/uL (ref 140–400)
RBC: 3.81 10*6/uL — ABNORMAL LOW (ref 4.20–5.80)
RDW: 13.6 % (ref 11.0–15.0)
Total Lymphocyte: 27.6 %
WBC: 5.3 10*3/uL (ref 3.8–10.8)

## 2019-06-16 LAB — PSA: PSA: 3.4 ng/mL (ref <=4.0)

## 2019-06-16 LAB — VITAMIN D 25 HYDROXY (VIT D DEFICIENCY, FRACTURES): Vit D, 25-Hydroxy: 50 ng/mL (ref 30–100)

## 2019-06-16 MED ORDER — MONTELUKAST SODIUM 10 MG PO TABS: 10.0000 mg | ORAL_TABLET | Freq: Every day | ORAL | 5 refills | Status: DC

## 2019-06-16 MED ORDER — FAMOTIDINE 20 MG PO TABS: 20.0000 mg | ORAL_TABLET | Freq: Two times a day (BID) | ORAL | 5 refills | Status: DC | PRN

## 2019-06-16 MED ORDER — ATORVASTATIN CALCIUM 20 MG PO TABS: 20.0000 mg | ORAL_TABLET | Freq: Every day | ORAL | 5 refills | Status: DC

## 2019-06-16 MED ORDER — TRELEGY ELLIPTA 100-62.5-25 MCG/INH IN AEPB
1.0000 | INHALATION_SPRAY | Freq: Every day | RESPIRATORY_TRACT | 5 refills | Status: DC
Start: 2019-06-16 — End: 2019-12-16

## 2019-06-16 MED ORDER — APIXABAN 5 MG PO TABS: 5.0000 mg | ORAL_TABLET | Freq: Two times a day (BID) | ORAL | 5 refills | Status: DC

## 2019-06-16 MED ORDER — TAMSULOSIN HCL 0.4 MG PO CAPS: 0.4000 mg | ORAL_CAPSULE | Freq: Every evening | ORAL | 5 refills | Status: DC

## 2019-06-16 MED ORDER — TADALAFIL 5 MG PO TABS: 5.0000 mg | ORAL_TABLET | Freq: Every day | ORAL | 5 refills | Status: DC

## 2019-06-16 MED ORDER — TADALAFIL 10 MG PO TABS: 10.0000 mg | ORAL_TABLET | ORAL | 0 refills | Status: DC | PRN

## 2019-06-16 MED ORDER — ALBUTEROL SULFATE HFA 108 (90 BASE) MCG/ACT IN AERS: 2.0000 | INHALATION_SPRAY | RESPIRATORY_TRACT | 0 refills | Status: DC | PRN

## 2019-06-16 MED ORDER — AZELASTINE HCL 0.05 % OP SOLN: 2.0000 [drp] | Freq: Two times a day (BID) | OPHTHALMIC | 5 refills | Status: DC

## 2019-06-16 NOTE — Progress Notes (Signed)
Name: [name]   MRN: [id number]    DOB: 02/22/1950   Date:06/16/2019       Progress Note  Subjective  Chief Complaint  Chief Complaint  Patient presents with  . Annual Exam    HPI  Morbid Obese: he was up to 303 lbs May 2020 , it was down to 287 lbs and today is up again at 194 lbs. He states he has been a little more stressed at work and is eating more. He has BMI above 35 with co-morbidities. GERD, OA, Dyslipidemia  Atherosclerosis of aorta: on Eliquis and atorvastatin   Asthma Moderate: he is using [employer] daily, he has been using rescue inhaler more often a few times a week. Discussed changing to Trelegy and only using albuterol prn. He has some sob and has been using inhaler more often, no wheezing or cough lately .  History of anemia: but seen by hematologistlevel back in Nov 2019 and Hgb  was11.5 . Had blood transfusion  after his left hip replacement surgery Dec 2020. His last hbg at hospital prior to discharge was was 9, labs done June 2020 showed Hbg of 12.4 , this past Feb went down to 10.3 explained anemia of chronic disease.  CKI stage III: and hyperkalemia, under the care of Dr. Holley Raring. No pruritus.Good urine outputPer patient his last level was slightly low, but fluctuates , last GFR was 49 7 - done by Dr. Holley Raring 02/2019 . CKI III stage 3a  Dyslipidemia: he is not on medication, low HDl reviewed labs with patient, Explained he has atherosclerosis of aorta . He is taking Atorvastatin and we will rechrecklevel today   Chronic DVT: on Eliquis, no side effects of medication , no easy bruising. No leg pain or swelling with ambulation  GERD and eructation: he continues to have bloating and burping, mild GERD, taking Pepcid, advised to stop drinking from a straw, not to chew gum, but no longer bothering him like it was  BPHED: he is taking cialis daily and also flomax, still getting up multiple times with nocturia. He is taking medications,  Cialis 5 mg is not working for ED, we will check PSA and add cialis 10 mg to take prn :   AR: he has been taking singulair, Allegra 180 mg daily and sometimes Allegra D. Also using eye drops to control itching and watery eyes.   Patient Active Problem List   Diagnosis Date Noted  . Primary osteoarthritis of left hip 12/14/2017  . Chronic deep vein thrombosis (DVT) of calf muscle vein of left lower extremity ([place]) 11/30/2017  . 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 Atherosclerosis of abdominal aorta ([place]) 09/15/2017  . Chronic deep vein thrombosis (DVT) of left popliteal vein ([place]) 06/26/2017  . Primary osteoarthritis of left knee 12/19/2016  . Anemia, unspecified 04/30/2016  . History of benign neoplasm of adrenal gland 02/11/2016  . History of iron deficiency anemia 10/10/2015  . BPH (benign prostatic hyperplasia) 06/20/2015  . ED (erectile dysfunction) 06/20/2015  . Allergic rhinitis, seasonal 06/20/2015  . Anemia of chronic disease 06/20/2015  . Hypogonadism in male 06/20/2015  . Asthma, well controlled, moderate persistent 06/20/2015  . Hypertension, benign 06/20/2015  . Hyperglycemia 06/20/2015  . History of shingles 06/20/2015  . GERD without esophagitis 06/20/2015  . Chronic radicular low back pain 06/20/2015  . History of epilepsy 06/20/2015  . Migraine without aura and without status migrainosus, not intractable 06/20/2015  . Dyslipidemia 06/20/2015  . Primary osteoarthritis of  both knees 06/20/2015    Past Surgical History:  Procedure Laterality Date  . ADRENALECTOMY Left 02/11/2016   [facility]  . COLONOSCOPY  02/2012   normal  . JOINT REPLACEMENT    . KNEE ARTHROSCOPY Left 10/06/2009  . SINUS EXPLORATION    . TOTAL HIP ARTHROPLASTY Left 12/14/2017   Procedure: TOTAL HIP ARTHROPLASTY ANTERIOR APPROACH;  Surgeon: Dorna Leitz, MD;  Location: [facility];  Service: Orthopedics;  Laterality: Left;  . TOTAL KNEE ARTHROPLASTY Right 06/20/2016  . TOTAL KNEE  ARTHROPLASTY Right 06/20/2016   Procedure: TOTAL KNEE ARTHROPLASTY;  Surgeon: Dorna Leitz, MD;  Location: [place];  Service: Orthopedics;  Laterality: Right;  . TOTAL KNEE ARTHROPLASTY Left 12/19/2016   Procedure: LEFT TOTAL KNEE ARTHROPLASTY;  Surgeon: Dorna Leitz, MD;  Location: [facility];  Service: Orthopedics;  Laterality: Left;  Adductor Block    Family History  Problem Relation Age of Onset  . Diabetes Mother   . Heart disease Mother   . Lung disease Mother   . Seizures Maternal Grandmother   . Alzheimer's disease Brother     Social History   Tobacco Use  . Smoking status: Former Smoker    Packs/day: 1.00    Years: 10.00    Pack years: 10.00  . Smokeless tobacco: Never Used  . Tobacco comment: 38 years ago 27 when he stopped  Substance Use Topics  . Alcohol use: No    Alcohol/week: 0.0 standard drinks     Current Outpatient Medications:  .  albuterol (VENTOLIN HFA) 108 (90 Base) MCG/ACT inhaler, TAKE 2 PUFFS BY MOUTH EVERY 6 HOURS AS NEEDED FOR WHEEZE OR SHORTNESS OF BREATH, Disp: 8.5 g, Rfl: 0 .  apixaban (ELIQUIS) 5 MG TABS tablet, Take 1 tablet (5 mg total) by mouth 2 (two) times daily., Disp: 60 tablet, Rfl: 5 .  atorvastatin (LIPITOR) 20 MG tablet, Take 1 tablet (20 mg total) by mouth daily., Disp: 30 tablet, Rfl: 5 .  azelastine (OPTIVAR) 0.05 % ophthalmic solution, INSTILL 2 DROPS INTO BOTH EYES TWICE A DAY, Disp: 6 mL, Rfl: 5 .  BREO ELLIPTA 200-25 MCG/INH AEPB, Inhale 1 puff into the lungs daily., Disp: 60 each, Rfl: 5 .  famotidine (PEPCID) 20 MG tablet, Take 1 tablet (20 mg total) by mouth 2 (two) times daily as needed for heartburn or indigestion., Disp: 60 tablet, Rfl: 5 .  fexofenadine-pseudoephedrine (ALLEGRA-D 24) 180-240 MG 24 hr tablet, Take 1 tablet by mouth daily as needed (for allergies.)., Disp: 30 tablet, Rfl: 2 .  fluticasone (FLONASE) 50 MCG/ACT nasal spray, SPRAY 2 SPRAYS INTO EACH NOSTRIL EVERY DAY, Disp: 16 mL, Rfl: 2 .  Glutamine POWD, by Does not  apply route., Disp: , Rfl:  .  montelukast (SINGULAIR) 10 MG tablet, Take 1 tablet (10 mg total) by mouth daily., Disp: 30 tablet, Rfl: 5 .  Multiple Vitamins-Minerals (MULTIVITAMIN ADULTS 50+ PO), Take 1 tablet by mouth daily. NATURE'S CODE MEN OVER 50 MULTIVITAMIN PACK , Disp: , Rfl:  .  OVER THE COUNTER MEDICATION, Take 1 tablet by mouth 3 (three) times daily. TEST-HD TESTOSTERONE SUPPORT , Disp: , Rfl:  .  patiromer (VELTASSA) 8.4 g packet, Take 8.4 g by mouth daily. , Disp: , Rfl:  .  patiromer (VELTASSA) 8.4 g packet, MIX 1 PACKET IN 1/3 CUP OF WATER AND DRINK BY MOUTH ONCE DAILY, Disp: , Rfl:  .  Polyethyl Glycol-Propyl Glycol (SYSTANE ULTRA) 0.4-0.3 % SOLN, Place 1-2 drops into both eyes 3 (three) times daily as needed (  for dry/irritated eyes.)., Disp: , Rfl:  .  tadalafil (CIALIS) 5 MG tablet, Take 1 tablet (5 mg total) by mouth daily., Disp: 30 tablet, Rfl: 5 .  tamsulosin (FLOMAX) 0.4 MG CAPS capsule, Take 1 capsule (0.4 mg total) by mouth every evening., Disp: 30 capsule, Rfl: 5 .  tiZANidine (ZANAFLEX) 2 MG tablet, TAKE 1 TABLET (2 MG TOTAL) BY MOUTH 3 (THREE) TIMES DAILY., Disp: 90 tablet, Rfl: 1 .  vitamin B-12 (CYANOCOBALAMIN) 500 MCG tablet, Take 500 mcg by mouth daily., Disp: , Rfl:  .  cholecalciferol (VITAMIN D3) 25 MCG (1000 UT) tablet, Take 1,000 Units by mouth daily. (Patient not taking: Reported on 06/16/2019), Disp: , Rfl:  .  Glucosamine-MSM-Hyaluronic Acd (JOINT HEALTH PO), Take 1 tablet by mouth daily. INSTAFLEX ADVANCED JOINT SUPPORT (Patient not taking: Reported on 06/16/2019), Disp: , Rfl:  .  Lactobacillus Rhamnosus, GG, (CULTURELLE) CAPS, Take 1 capsule by mouth daily.  (Patient not taking: Reported on 06/16/2019), Disp: , Rfl:  .  Misc Natural Products (GINSENG COMPLEX PO), Take 1 tablet by mouth daily. (Patient not taking: Reported on 06/16/2019), Disp: , Rfl:  .  Misc Natural Products (NF FORMULAS TESTOSTERONE PO), Take 2 tablets by mouth daily. NATURE'S PLUS T-MALE  SUPPLEMENT (Patient not taking: Reported on 06/16/2019), Disp: , Rfl:  .  MOBIC 15 MG tablet, Take 15 mg by mouth daily., Disp: , Rfl:   Allergies  Allergen Reactions  . Almond (Diagnostic) Other (See Comments)    Migraines  . Lactose Intolerance (Gi) Other (See Comments)    MIGRAINES  . Peanut-Containing Drug Products Other (See Comments)    Migraines  . Shellfish Allergy Other (See Comments)    Congestion/breathing problems/migraines.    I personally reviewed active problem list, medication list, allergies, family history, social history, health maintenance, notes from last encounter with the patient/caregiver today.   ROS  Constitutional: Negative for fever , positive for  weight change.  Respiratory: Negative for cough but has noticed  shortness of breath.   Cardiovascular: Negative for chest pain or palpitations.  Gastrointestinal: Negative for abdominal pain, no bowel changes.  Musculoskeletal: Negative for gait problem or joint swelling.  Skin: Negative for rash.  Neurological: Negative for dizziness or headache.  No other specific complaints in a complete review of systems (except as listed in HPI above).  Objective  Vitals:   06/16/19 0818  BP: 110/70  Pulse: (!) 103  Resp: 16  Temp: (!) 97.3 F (36.3 C)  TempSrc: Temporal  SpO2: 95%  Weight: 294 lb 11.2 oz (133.7 kg)  Height: 6\' 2"  (1.88 m)    Body mass index is 37.84 kg/m.  Physical Exam  Constitutional: Patient appears well-developed and well-nourished. Obese No distress.  HEENT: head atraumatic, normocephalic, pupils equal and reactive to light, neck supple Cardiovascular: Normal rate, regular rhythm and normal heart sounds.  No murmur heard. No BLE edema. Pulmonary/Chest: Effort normal and breath sounds normal. No respiratory distress. Abdominal: Soft.  There is no tenderness. Rectal: enlarged and a nodule at 12 o'clock Psychiatric: Patient has a normal mood and affect. behavior is normal. Judgment  and thought content normal.  PHQ2/9: Depression screen [facility] 2/9 06/16/2019 12/15/2018 06/22/2018 01/28/2018 09/15/2017  Decreased Interest 0 0 0 0 0  Down, Depressed, Hopeless 0 0 0 1 0  PHQ - 2 Score 0 0 0 1 0  Altered sleeping 0 0 0 - 0  Tired, decreased energy 0 0 0 - 1  Change in appetite 0 0 0 -  0  Feeling bad or failure about yourself  0 0 0 - 0  Trouble concentrating 0 0 0 - 0  Moving slowly or fidgety/restless 0 0 0 - 0  Suicidal thoughts 0 0 0 - 0  PHQ-9 Score 0 0 0 - 1  Difficult doing work/chores - - - - Not difficult at all    phq 9 is negative   Fall Risk: Fall Risk  06/16/2019 12/15/2018 06/22/2018 01/28/2018 11/30/2017  Falls in the past year? 0 0 0 0 0  Number falls in past yr: 0 0 0 0 -  Injury with Fall? 0 0 0 0 -      Functional Status Survey: Is the patient deaf or have difficulty hearing?: No Does the patient have difficulty seeing, even when wearing glasses/contacts?: No Does the patient have difficulty concentrating, remembering, or making decisions?: No Does the patient have difficulty walking or climbing stairs?: No Does the patient have difficulty dressing or bathing?: No Does the patient have difficulty doing errands alone such as visiting a doctor's office or shopping?: No    Assessment & Plan  1. Benign prostatic hyperplasia with urinary obstruction  - tamsulosin (FLOMAX) 0.4 MG CAPS capsule; Take 1 capsule (0.4 mg total) by mouth every evening.  Dispense: 30 capsule; Refill: 5 - tadalafil (CIALIS) 5 MG tablet; Take 1 tablet (5 mg total) by mouth daily.  Dispense: 30 tablet; Refill: 5 - PSA - Ambulatory referral to Urology  2. Asthma, well controlled, moderate persistent  - montelukast (SINGULAIR) 10 MG tablet; Take 1 tablet (10 mg total) by mouth daily.  Dispense: 30 tablet; Refill: 5 - albuterol (VENTOLIN HFA) 108 (90 Base) MCG/ACT inhaler; Inhale 2 puffs into the lungs every 4 (four) hours as needed for wheezing or shortness of breath.   Dispense: 8.5 g; Refill: 0  3. Gastroesophageal reflux disease without esophagitis  - famotidine (PEPCID) 20 MG tablet; Take 1 tablet (20 mg total) by mouth 2 (two) times daily as needed for heartburn or indigestion.  Dispense: 60 tablet; Refill: 5  4. Atherosclerosis of abdominal aorta (HCC)  - atorvastatin (LIPITOR) 20 MG tablet; Take 1 tablet (20 mg total) by mouth daily.  Dispense: 30 tablet; Refill: 5 - Lipid panel  5. Chronic deep vein thrombosis (DVT) of calf muscle vein of left lower extremity (HCC)  - apixaban (ELIQUIS) 5 MG TABS tablet; Take 1 tablet (5 mg total) by mouth 2 (two) times daily.  Dispense: 60 tablet; Refill: 5  6. Perennial allergic rhinitis with seasonal variation  - azelastine (OPTIVAR) 0.05 % ophthalmic solution; Place 2 drops into both eyes 2 (two) times daily.  Dispense: 6 mL; Refill: 5   7. Erectile dysfunction, unspecified erectile dysfunction type  - tadalafil (CIALIS) 10 MG tablet; Take 1 tablet (10 mg total) by mouth every three (3) days as needed for erectile dysfunction.  Dispense: 30 tablet; Refill: 0  8. Chronic deep vein thrombosis (DVT) of left popliteal vein (HCC)   9. Dyslipidemia   10. Hypogonadism in male   67. Anemia of chronic disease  - CBC with Differential/Platelet - Iron, TIBC and Ferritin Panel  12. Primary osteoarthritis involving multiple joints   13. Benign hypertension with chronic kidney disease, stage III  - COMPLETE METABOLIC PANEL WITH GFR - CBC with Differential/Platelet - VITAMIN D 25 Hydroxy (Vit-D Deficiency, Fractures)  14. Chronic kidney disease, stage 3a  - COMPLETE METABOLIC PANEL WITH GFR - VITAMIN D 25 Hydroxy (Vit-D Deficiency, Fractures)  15. Prostate nodule  with urinary obstruction  - Ambulatory referral to Urology  16. Morbid obesity ([place])  Discussed with the patient the risk posed by an increased BMI. Discussed importance of portion control, calorie counting and at least 150 minutes of  physical activity weekly. Avoid sweet beverages and drink more water. Eat at least 6 servings of fruit and vegetables daily

## 2019-06-23 ENCOUNTER — Encounter: Payer: Medicare Other | Admitting: Family Medicine

## 2019-07-04 DIAGNOSIS — N1831 Chronic kidney disease, stage 3a: Secondary | ICD-10-CM | POA: Diagnosis not present

## 2019-07-11 ENCOUNTER — Encounter: Payer: Self-pay | Admitting: Family Medicine

## 2019-07-21 DIAGNOSIS — R350 Frequency of micturition: Secondary | ICD-10-CM | POA: Diagnosis not present

## 2019-07-21 DIAGNOSIS — R3911 Hesitancy of micturition: Secondary | ICD-10-CM | POA: Diagnosis not present

## 2019-07-21 DIAGNOSIS — R3915 Urgency of urination: Secondary | ICD-10-CM | POA: Diagnosis not present

## 2019-07-21 DIAGNOSIS — N5202 Corporo-venous occlusive erectile dysfunction: Secondary | ICD-10-CM | POA: Diagnosis not present

## 2019-07-21 DIAGNOSIS — N401 Enlarged prostate with lower urinary tract symptoms: Secondary | ICD-10-CM | POA: Diagnosis not present

## 2019-08-23 IMAGING — XA DG FLUORO GUIDE NDL PLC/BX
1 series · 1 of 1 positions shown · non-contrast
Comparison: none

CLINICAL DATA: Chronic left hip pain. Severe left hip
osteoarthritis.

[Series 1: ortho adipose · 1 of 1 slices shown]
[im 1/1]
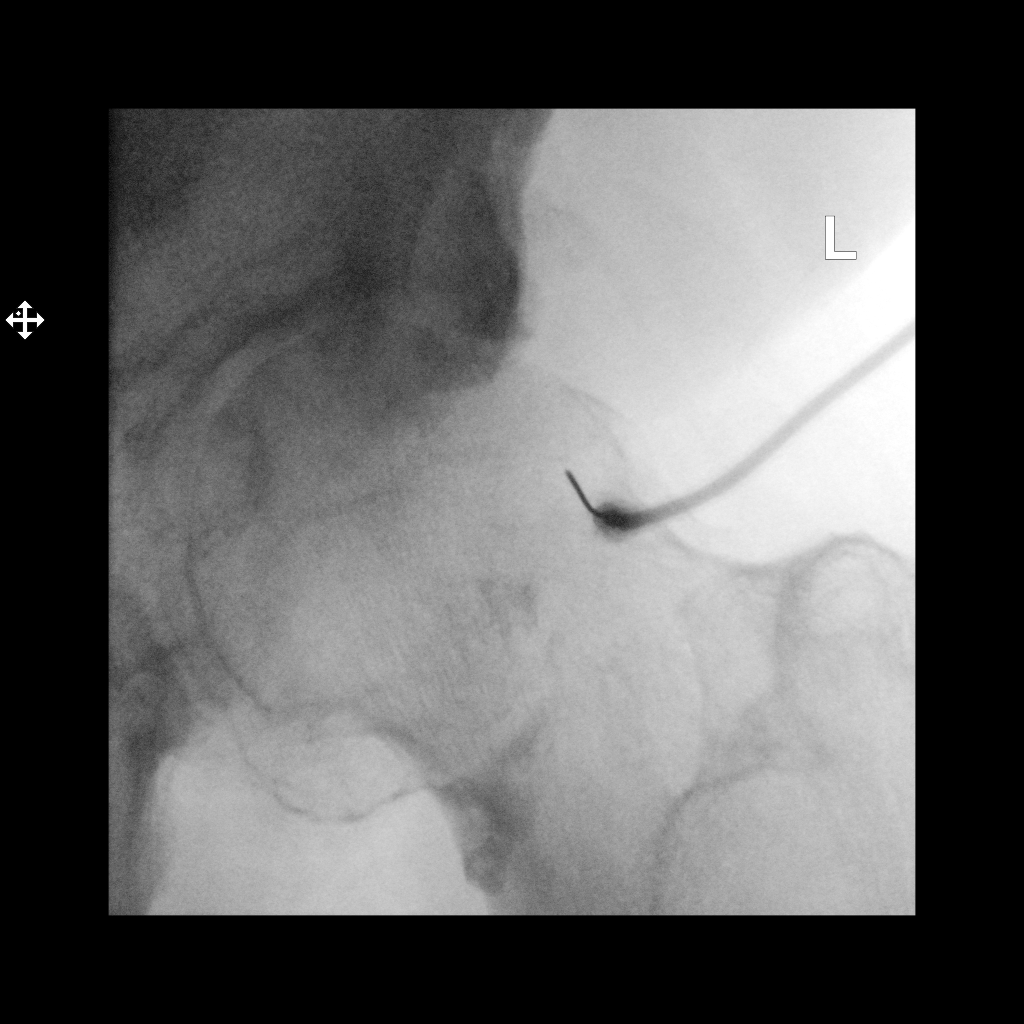

[1 of 1 positions shown; findings below may reference images not displayed]

FLUOROSCOPY TIME:  Radiation Exposure Index (as provided by the
fluoroscopic device): 18.68 Gy*m2

Fluoroscopy Time:  4 seconds

Number of Acquired Images:  0

PROCEDURE:
The risks and benefits of the procedure were discussed with the
patient, and written informed consent was obtained. The patient
stated no history of allergy to contrast media. A formal timeout
procedure was performed with the patient according to departmental
protocol.

The patient was placed supine on the fluoroscopy table and the left
hip joint was identified under fluoroscopy. The skin overlying the
left hip joint was subsequently cleaned with Betadine and a sterile
drape was placed over the area of interest. 2 ml 1% Lidocaine was
used to anesthetize the skin around the needle insertion site.

A 22 gauge spinal needle was inserted into the left hip joint under
fluoroscopy.

Diagnostic injection of 2 ml Isovue-M 200 iodinated contrast
demonstrates intra-articular spread without intravascular component.

120mg Depo-Medrol and 5 ml Sensorcaine 0.5% were then administered
into the left hip joint.

The needle was removed and hemostasis was achieved. There were no
immediate complications.
IMPRESSION: Technically successful fluoroscopically guided left hip steroid
injection.

## 2019-09-05 DIAGNOSIS — Z0000 Encounter for general adult medical examination without abnormal findings: Secondary | ICD-10-CM | POA: Insufficient documentation

## 2019-09-05 NOTE — Patient Instructions (Addendum)
  Mr. [name] , Thank you for taking time to come for your Medicare Wellness Visit. I appreciate your ongoing commitment to your health goals. Please review the following plan we discussed and let me know if I can assist you in the future.   These are the goals we discussed:  Increase physical activity to 30 minutes 5 days a week Try melatonin for sleep    This is a list of the screening recommended for you and due dates:  Health Maintenance  Topic Date Due  . Flu Shot  08/07/2019  . Colon Cancer Screening  02/14/2022  . Tetanus Vaccine  06/19/2025  . COVID-19 Vaccine  Completed  .  Hepatitis C: One time screening is recommended by Center for Disease Control  (CDC) for  adults born from [birth year] through 1964.   Completed  . Pneumonia vaccines  Completed

## 2019-09-05 NOTE — Progress Notes (Signed)
Patient: [name], Male    DOB: February 27, 1950, 69 y.o.   MRN: [id number]  Visit Date: 09/06/2019  Today's Provider: Loistine Chance, MD   Medicare Wellness Exam   Subjective:   [name] is a 69 y.o. male who presents today for his Subsequent Annual Wellness Visit.  Patient/Caregiver input:     IPSS Questionnaire (AUA-7): Over the past month.   1)  How often have you had a sensation of not emptying your bladder completely after you finish urinating?  1 - Less than 1 time in 5  2)  How often have you had to urinate again less than two hours after you finished urinating? 3 - About half the time  3)  How often have you found you stopped and started again several times when you urinated?  1 - Less than 1 time in 5  4) How difficult have you found it to postpone urination?  0 - Not at all  5) How often have you had a weak urinary stream?  1 - Less than 1 time in 5  6) How often have you had to push or strain to begin urination?  0 - Not at all  7) How many times did you most typically get up to urinate from the time you went to bed until the time you got up in the morning?  4 - 4 times  Total score:  0-7 mildly symptomatic   8-19 moderately symptomatic   20-35 severely symptomatic     Review of Systems   Constitutional: Negative for fever or weight change.  Respiratory: Negative for cough and shortness of breath.   Cardiovascular: Negative for chest pain or palpitations.  Gastrointestinal: Negative for abdominal pain, no bowel changes.  Musculoskeletal: Negative for gait problem or joint swelling.  Skin: Negative for rash.  Neurological: Negative for dizziness or headache.  No other specific complaints in a complete review of systems (except as listed in HPI above).  Past Medical History:  Diagnosis Date  . Allergic rhinitis   . Anemia   . BPH (benign prostatic hyperplasia)   . Chronic kidney disease   . Chronic sinusitis   . Complication of anesthesia     work up during surgery 2x in the past   . Decreased libido   . DVT (deep venous thrombosis) (HCC)    left leg after left knee surgery  . ED (erectile dysfunction)   . Fatigue   . Hematuria   . HTN (hypertension)    history of  . Hypogonadism in male   . Hypokalemia    history of   . IBS (irritable bowel syndrome)   . Low serum vitamin D   . Lumbago   . Migraine   . Mild intermittent asthma   . Osteoarthritis of hip    left (bone-on-bone) Dr. Alta Corning & Alvina Filbert. Modena Slater, PA-C  . Pneumonia    walking pneumonia  . Reflux   . Shingles   . Unilateral inguinal hernia without obstruction or gangrene     Past Surgical History:  Procedure Laterality Date  . ADRENALECTOMY Left 02/11/2016   [facility]  . COLONOSCOPY  02/2012   normal  . JOINT REPLACEMENT    . KNEE ARTHROSCOPY Left 10/06/2009  . SINUS EXPLORATION    . TOTAL HIP ARTHROPLASTY Left 12/14/2017   Procedure: TOTAL HIP ARTHROPLASTY ANTERIOR APPROACH;  Surgeon: Dorna Leitz, MD;  Location: [place];  Service: Orthopedics;  Laterality: Left;  . TOTAL  KNEE ARTHROPLASTY Right 06/20/2016  . TOTAL KNEE ARTHROPLASTY Right 06/20/2016   Procedure: TOTAL KNEE ARTHROPLASTY;  Surgeon: Dorna Leitz, MD;  Location: [place];  Service: Orthopedics;  Laterality: Right;  . TOTAL KNEE ARTHROPLASTY Left 12/19/2016   Procedure: LEFT TOTAL KNEE ARTHROPLASTY;  Surgeon: Dorna Leitz, MD;  Location: [facility];  Service: Orthopedics;  Laterality: Left;  Adductor Block    Family History  Problem Relation Age of Onset  . Diabetes Mother   . Heart disease Mother   . Lung disease Mother   . Seizures Maternal Grandmother   . Alzheimer's disease Brother     Social History   Socioeconomic History  . Marital status: Married    Spouse name: deborah  . Number of children: 2  . Years of education: Not on file  . Highest education level: Professional school degree (e.g., MD, DDS, DVM, JD)  Occupational History  . Occupation: [employer]   Tobacco Use  . Smoking status: Former Smoker    Packs/day: 1.00    Years: 10.00    Pack years: 10.00    Types: Cigarettes    Quit date: 1980    Years since quitting: 41.6  . Smokeless tobacco: Never Used  . Tobacco comment: 39 years ago 65 when he stopped  Vaping Use  . Vaping Use: Never used  Substance and Sexual Activity  . Alcohol use: No    Alcohol/week: 0.0 standard drinks  . Drug use: No  . Sexual activity: Yes    Partners: Female  Other Topics Concern  . Not on file  Social History Narrative   Patient has 8 grands   Social Determinants of Health   Financial Resource Strain: Low Risk   . Difficulty of Paying Living Expenses: Not hard at all  Food Insecurity: No Food Insecurity  . Worried About Charity fundraiser in the Last Year: Never true  . Ran Out of Food in the Last Year: Never true  Transportation Needs: No Transportation Needs  . Lack of Transportation (Medical): No  . Lack of Transportation (Non-Medical): No  Physical Activity: Insufficiently Active  . Days of Exercise per Week: 3 days  . Minutes of Exercise per Session: 20 min  Stress: No Stress Concern Present  . Feeling of Stress : Not at all  Social Connections: Socially Integrated  . Frequency of Communication with Friends and Family: More than three times a week  . Frequency of Social Gatherings with Friends and Family: Once a week  . Attends Religious Services: More than 4 times per year  . Active Member of Clubs or Organizations: Yes  . Attends Archivist Meetings: More than 4 times per year  . Marital Status: Married  Human resources officer Violence: Not At Risk  . Fear of Current or Ex-Partner: No  . Emotionally Abused: No  . Physically Abused: No  . Sexually Abused: No    Outpatient Encounter Medications as of 09/06/2019  Medication Sig  . albuterol (VENTOLIN HFA) 108 (90 Base) MCG/ACT inhaler Inhale 2 puffs into the lungs every 4 (four) hours as needed for wheezing or shortness of  breath.  Marland Kitchen apixaban (ELIQUIS) 5 MG TABS tablet Take 1 tablet (5 mg total) by mouth 2 (two) times daily.  Marland Kitchen atorvastatin (LIPITOR) 20 MG tablet Take 1 tablet (20 mg total) by mouth daily.  Marland Kitchen azelastine (OPTIVAR) 0.05 % ophthalmic solution Place 2 drops into both eyes 2 (two) times daily.  . famotidine (PEPCID) 20 MG tablet Take  1 tablet (20 mg total) by mouth 2 (two) times daily as needed for heartburn or indigestion.  . fexofenadine-pseudoephedrine (ALLEGRA-D 24) 180-240 MG 24 hr tablet Take 1 tablet by mouth daily as needed (for allergies.).  Marland Kitchen fluticasone (FLONASE) 50 MCG/ACT nasal spray SPRAY 2 SPRAYS INTO EACH NOSTRIL EVERY DAY  . Fluticasone-Umeclidin-Vilant (TRELEGY ELLIPTA) 100-62.5-25 MCG/INH AEPB Inhale 1 puff into the lungs daily.  Marland Kitchen MOBIC 15 MG tablet Take 15 mg by mouth daily.  . montelukast (SINGULAIR) 10 MG tablet Take 1 tablet (10 mg total) by mouth daily.  . Multiple Vitamins-Minerals (MULTIVITAMIN ADULTS 50+ PO) Take 1 tablet by mouth daily. NATURE'S CODE MEN OVER 50 MULTIVITAMIN PACK   . patiromer (VELTASSA) 8.4 g packet Take 1 packet by mouth daily.  Vladimir Faster Glycol-Propyl Glycol (SYSTANE ULTRA) 0.4-0.3 % SOLN Place 1-2 drops into both eyes 3 (three) times daily as needed (for dry/irritated eyes.).  Marland Kitchen tadalafil (CIALIS) 10 MG tablet Take 1 tablet (10 mg total) by mouth every three (3) days as needed for erectile dysfunction.  . tadalafil (CIALIS) 5 MG tablet Take 1 tablet (5 mg total) by mouth daily.  . tamsulosin (FLOMAX) 0.4 MG CAPS capsule Take 1 capsule (0.4 mg total) by mouth every evening.  Marland Kitchen tiZANidine (ZANAFLEX) 2 MG tablet TAKE 1 TABLET (2 MG TOTAL) BY MOUTH 3 (THREE) TIMES DAILY.  . vitamin B-12 (CYANOCOBALAMIN) 500 MCG tablet Take 500 mcg by mouth daily.  . cholecalciferol (VITAMIN D3) 25 MCG (1000 UT) tablet Take 1,000 Units by mouth daily. (Patient not taking: Reported on 06/16/2019)   No facility-administered encounter medications on file as of 09/06/2019.     Allergies  Allergen Reactions  . Almond (Diagnostic) Other (See Comments)    Migraines  . Lactose Intolerance (Gi) Other (See Comments)    MIGRAINES  . Peanut-Containing Drug Products Other (See Comments)    Migraines  . Shellfish Allergy Other (See Comments)    Congestion/breathing problems/migraines.    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  Was the timed Get Up and Go test longer than 30 seconds?  yes 2.  Does the patient need help with the phone, transportation, shopping,      preparing meals, housework, laundry, medications, or managing money?  no 3.  Does the patient's home have:  loose throw rugs in the hallway?   no      Grab bars in the bathroom? yes      Handrails on the stairs?   yes      Poor lighting?   no 4.  Has the patient noticed any hearing difficulties?   yes  Diet Recall and Exercise Regimen:   Fall Risk: See screening under Objective Information  Depression Screen:  See screening under Objective Information  Advanced Directives: A voluntary discussion about advance care planning including the explanation and discussion of advance directives was discussed with the patient. Explanation about the health care proxy and living will was reviewed.  During this discussion, the patient was able to identify a health care proxy as wife  and plans/does not plan to fill out the paperwork required and will bring this to our office to keep on file. Does patient have a HCPOA?    no  Does patient have a living will or MOST form?  no  Cancer Screenings: Skin: discussed atypical lesions  Lung: Low Dose CT Chest recommended if Age 62-80 years, 30 pack-year currently smoking OR have quit w/in 15years. Patient does not qualify.   Lifestyle risk factor issued reviewed: Diet, exercise, weight management, advised patient smoking is not healthy, nutrition/diet.   Prostate: PSA up to date recently seen by urologist  Colon: repeat in 2024   Additional Screenings:  Hepatitis B/HIV/Syphillis: not interested  Hepatitis C Screening: 06/2015 Intimate Partner Violence: negative screen  AAA Screen: Men age 37 to 44 years if ever smoked recommended to get a one time AAA ultrasound screening exam.Had CT done in 2017 no aneurysm but has atherosclerosis of aorta    Objective:   Vitals: BP 120/80   Pulse (!) 104   Temp 97.9 F (36.6 C) (Oral)   Resp 16   Ht 6\' 2"  (1.88 m)   Wt 296 lb 4.8 oz (134.4 kg)   SpO2 98%   BMI 38.04 kg/m  Body mass index is 38.04 kg/m.  Lab Results  Component Value Date   CHOL 143 06/16/2019   CHOL 178 09/15/2017   CHOL 189 01/26/2017   Lab Results  Component Value Date   HDL 44 06/16/2019   HDL 44 09/15/2017   HDL 48 01/26/2017   Lab Results  Component Value Date   LDLCALC 79 06/16/2019   LDLCALC 108 (H) 09/15/2017   LDLCALC 118 (H) 01/26/2017   Lab Results  Component Value Date   TRIG 118 06/16/2019   TRIG 151 (H) 09/15/2017   TRIG 122 01/26/2017   Lab Results  Component Value Date   CHOLHDL 3.3 06/16/2019   CHOLHDL 4.0 09/15/2017   CHOLHDL 3.9 01/26/2017   No results found for: LDLDIRECT  No exam data present  Physical Exam Constitutional: Patient appears well-developed and well-nourished. Obese  No distress.  HEENT: head atraumatic, normocephalic, pupils equal and reactive to light, ears normal TM  neck supple, throat within normal limits Cardiovascular: Normal rate, regular rhythm and normal heart sounds.  No murmur heard. No BLE edema. Pulmonary/Chest: Effort normal and breath sounds normal. No respiratory distress. Abdominal: Soft.  There is no tenderness. Psychiatric: Patient has a normal mood and affect. behavior is normal. Judgment and thought content  normal.   Cognitive Testing - 6-CIT  Correct? Score   What year is it? yes 0 Yes = 0    No = 4  What month is it? yes 0 Yes = 0    No = 3  Remember:     Pia Mau, [place], [place]     What time is it? yes 0 Yes = 0    No = 3  Count backwards from 20 to 1 yes 0 Correct = 0    1 error = 2   More than 1 error = 4  Say the months of the year in reverse. yes 0 Correct = 0    1 error = 2   More than 1 error = 4  What address did I ask you to remember? yes 0 Correct = 0  1 error = 2    2 error = 4    3 error = 6    4 error = 8    All wrong = 10       TOTAL SCORE  0/28   Interpretation:  Normal  Normal (0-7) Abnormal (8-28)   Fall Risk:  Fall Risk  09/06/2019 06/16/2019 12/15/2018 06/22/2018 01/28/2018  Falls in the past year? 0 0 0 0 0  Number falls in past yr: 0 0 0 0 0  Injury with Fall? 0 0 0 0 0    Depression Screen Depression screen [facility] 2/9 09/06/2019 06/16/2019 12/15/2018 06/22/2018 01/28/2018  Decreased Interest 0 0 0 0 0  Down, Depressed, Hopeless 0 0 0 0 1  PHQ - 2 Score 0 0 0 0 1  Altered sleeping - 0 0 0 -  Tired, decreased energy - 0 0 0 -  Change in appetite - 0 0 0 -  Feeling bad or failure about yourself  - 0 0 0 -  Trouble concentrating - 0 0 0 -  Moving slowly or fidgety/restless - 0 0 0 -  Suicidal thoughts - 0 0 0 -  PHQ-9 Score - 0 0 0 -  Difficult doing work/chores - - - - -    Recent Results (from the past 2160 hour(s))  Lipid panel     Status: None   Collection Time: 06/16/19  9:09 AM  Result Value Ref Range   Cholesterol 143 <200 mg/dL   HDL 44 > OR = 40 mg/dL   Triglycerides 118 <150 mg/dL   LDL Cholesterol (Calc) 79 mg/dL (calc)    Comment: Reference range: <100 . Desirable range <100 mg/dL for primary prevention;   <70 mg/dL for patients with CHD or diabetic patients  with > or = 2 CHD risk factors. Marland Kitchen LDL-C is now calculated using the Martin-Hopkins  calculation, which is a validated novel method providing  better accuracy than the Friedewald  equation in the  estimation of LDL-C.  Cresenciano Genre et al. Annamaria Helling. 4650;354(65): 2061-2068  (http://education.[facility].com/faq/FAQ164)    Total CHOL/HDL Ratio 3.3 <5.0 (calc)   Non-HDL Cholesterol (Calc) 99 <130 mg/dL (calc)    Comment: For patients with diabetes plus 1 major ASCVD risk  factor, treating to a non-HDL-C goal of <100 mg/dL  (LDL-C of <70 mg/dL) is considered a therapeutic  option.   COMPLETE METABOLIC PANEL WITH GFR     Status: Abnormal   Collection Time: 06/16/19  9:09 AM  Result Value Ref Range   Glucose, Bld 126 (H) 65 - 99 mg/dL    Comment: .            Fasting reference interval . For someone without known diabetes, a glucose value >125 mg/dL indicates that they may have diabetes and this should be confirmed with a follow-up test. .    BUN 35 (H) 7 - 25 mg/dL   Creat 1.76 (H) 0.70 - 1.25 mg/dL    Comment: For patients >57 years of age, the reference limit for Creatinine is approximately 13% higher for people identified as African-American. .    GFR, Est Non African American 39 (L) > OR = 60 mL/min/1.6m2   GFR, Est African American 45 (L) > OR = 60 mL/min/1.70m2   BUN/Creatinine Ratio 20 6 - 22 (calc)   Sodium 138 135 - 146 mmol/L   Potassium 4.5 3.5 - 5.3 mmol/L   Chloride 105 98 - 110 mmol/L   CO2 24 20 - 32 mmol/L   Calcium 9.1 8.6 - 10.3 mg/dL   Total Protein 6.8 6.1 - 8.1 g/dL   Albumin 4.1 3.6 - 5.1 g/dL   Globulin 2.7 1.9 - 3.7 g/dL (calc)   AG Ratio 1.5 1.0 - 2.5 (calc)   Total Bilirubin 0.6 0.2 - 1.2 mg/dL  Alkaline phosphatase (APISO) 82 35 - 144 U/L   AST 27 10 - 35 U/L   ALT 21 9 - 46 U/L  CBC with Differential/Platelet     Status: Abnormal   Collection Time: 06/16/19  9:09 AM  Result Value Ref Range   WBC 5.3 3.8 - 10.8 Thousand/uL   RBC 3.81 (L) 4.20 - 5.80 Million/uL   Hemoglobin 11.0 (L) 13.2 - 17.1 g/dL   HCT 33.5 (L) 38 - 50 %   MCV 87.9 80.0 - 100.0 fL   MCH 28.9 27.0 - 33.0 pg   MCHC 32.8 32.0 - 36.0 g/dL   RDW 13.6  11.0 - 15.0 %   Platelets 209 140 - 400 Thousand/uL   MPV 10.6 7.5 - 12.5 fL   Neutro Abs 2,979 1,500 - 7,800 cells/uL   Lymphs Abs 1,463 850 - 3,900 cells/uL   Absolute Monocytes 610 200 - 950 cells/uL   Eosinophils Absolute 201 15 - 500 cells/uL   Basophils Absolute 48 0 - 200 cells/uL   Neutrophils Relative % 56.2 %   Total Lymphocyte 27.6 %   Monocytes Relative 11.5 %   Eosinophils Relative 3.8 %   Basophils Relative 0.9 %  Iron, TIBC and Ferritin Panel     Status: Abnormal   Collection Time: 06/16/19  9:09 AM  Result Value Ref Range   Iron 53 50 - 180 mcg/dL   TIBC 279 250 - 425 mcg/dL (calc)   %SAT 19 (L) 20 - 48 % (calc)   Ferritin 195 24 - 380 ng/mL  PSA     Status: None   Collection Time: 06/16/19  9:09 AM  Result Value Ref Range   PSA 3.4 < OR = 4.0 ng/mL    Comment: The total PSA value from this assay system is  standardized against the WHO standard. The test  result will be approximately 20% lower when compared  to the equimolar-standardized total PSA (Beckman  Coulter). Comparison of serial PSA results should be  interpreted with this fact in mind. . This test was performed using the Siemens  chemiluminescent method. Values obtained from  different assay methods cannot be used interchangeably. PSA levels, regardless of value, should not be interpreted as absolute evidence of the presence or absence of disease.   VITAMIN D 25 Hydroxy (Vit-D Deficiency, Fractures)     Status: None   Collection Time: 06/16/19  9:09 AM  Result Value Ref Range   Vit D, 25-Hydroxy 50 30 - 100 ng/mL    Comment: Vitamin D Status         25-OH Vitamin D: . Deficiency:                    <20 ng/mL Insufficiency:             20 - 29 ng/mL Optimal:                 > or = 30 ng/mL . For 25-OH Vitamin D testing on patients on  D2-supplementation and patients for whom quantitation  of D2 and D3 fractions is required, the [facility]ure[facility](TM) 25-OH VIT D, (D2,D3), LC/MS/MS is recommended:  order  code [phone] (patients >36yrs). See Note 1 . Note 1 . For additional information, please refer to  http://education.[facility].com/faq/FAQ199  (This link is being provided for informational/ educational purposes only.)      Assessment & Plan:     1. Medicare annual wellness visit, initial    Exercise Activities and Dietary  recommendations  Increase physical activity  He will try melatonin   Discussed health benefits of physical activity, and encouraged him to engage in regular exercise appropriate for his age and condition.   Immunization History  Administered Date(s) Administered  . Influenza, High Dose Seasonal PF 09/15/2017, 12/18/2018  . Influenza, Seasonal, Injecte, Preservative Fre 12/08/2011  . Influenza,inj,Quad PF,6+ Mos 10/21/2012, 10/31/2014, 10/10/2015, 10/13/2016  . Influenza-Unspecified 10/21/2012  . Moderna SARS-COVID-2 Vaccination 02/04/2019, 03/04/2019  . Pneumococcal Conjugate-13 02/29/2016  . Pneumococcal Polysaccharide-23 04/01/2012, 07/02/2017  . Tdap 12/08/2011  . Zoster 12/08/2011    Health Maintenance  Topic Date Due  . INFLUENZA VACCINE  09/30/2019 (Originally 08/07/2019)  . COLONOSCOPY  02/14/2022  . TETANUS/TDAP  06/19/2025  . COVID-19 Vaccine  Completed  . Hepatitis C Screening  Completed  . PNA vac Low Risk Adult  Completed     No orders of the defined types were placed in this encounter.   Current Outpatient Medications:  .  albuterol (VENTOLIN HFA) 108 (90 Base) MCG/ACT inhaler, Inhale 2 puffs into the lungs every 4 (four) hours as needed for wheezing or shortness of breath., Disp: 8.5 g, Rfl: 0 .  apixaban (ELIQUIS) 5 MG TABS tablet, Take 1 tablet (5 mg total) by mouth 2 (two) times daily., Disp: 60 tablet, Rfl: 5 .  atorvastatin (LIPITOR) 20 MG tablet, Take 1 tablet (20 mg total) by mouth daily., Disp: 30 tablet, Rfl: 5 .  azelastine (OPTIVAR) 0.05 % ophthalmic solution, Place 2 drops into both eyes 2 (two) times  daily., Disp: 6 mL, Rfl: 5 .  famotidine (PEPCID) 20 MG tablet, Take 1 tablet (20 mg total) by mouth 2 (two) times daily as needed for heartburn or indigestion., Disp: 60 tablet, Rfl: 5 .  fexofenadine-pseudoephedrine (ALLEGRA-D 24) 180-240 MG 24 hr tablet, Take 1 tablet by mouth daily as needed (for allergies.)., Disp: 30 tablet, Rfl: 2 .  fluticasone (FLONASE) 50 MCG/ACT nasal spray, SPRAY 2 SPRAYS INTO EACH NOSTRIL EVERY DAY, Disp: 16 mL, Rfl: 2 .  Fluticasone-Umeclidin-Vilant (TRELEGY ELLIPTA) 100-62.5-25 MCG/INH AEPB, Inhale 1 puff into the lungs daily., Disp: 60 each, Rfl: 5 .  MOBIC 15 MG tablet, Take 15 mg by mouth daily., Disp: , Rfl:  .  montelukast (SINGULAIR) 10 MG tablet, Take 1 tablet (10 mg total) by mouth daily., Disp: 30 tablet, Rfl: 5 .  Multiple Vitamins-Minerals (MULTIVITAMIN ADULTS 50+ PO), Take 1 tablet by mouth daily. NATURE'S CODE MEN OVER 50 MULTIVITAMIN PACK , Disp: , Rfl:  .  patiromer (VELTASSA) 8.4 g packet, Take 1 packet by mouth daily., Disp: , Rfl:  .  Polyethyl Glycol-Propyl Glycol (SYSTANE ULTRA) 0.4-0.3 % SOLN, Place 1-2 drops into both eyes 3 (three) times daily as needed (for dry/irritated eyes.)., Disp: , Rfl:  .  tadalafil (CIALIS) 10 MG tablet, Take 1 tablet (10 mg total) by mouth every three (3) days as needed for erectile dysfunction., Disp: 30 tablet, Rfl: 0 .  tadalafil (CIALIS) 5 MG tablet, Take 1 tablet (5 mg total) by mouth daily., Disp: 30 tablet, Rfl: 5 .  tamsulosin (FLOMAX) 0.4 MG CAPS capsule, Take 1 capsule (0.4 mg total) by mouth every evening., Disp: 30 capsule, Rfl: 5 .  tiZANidine (ZANAFLEX) 2 MG tablet, TAKE 1 TABLET (2 MG TOTAL) BY MOUTH 3 (THREE) TIMES DAILY., Disp: 90 tablet, Rfl: 1 .  vitamin B-12 (CYANOCOBALAMIN) 500 MCG tablet, Take 500 mcg by mouth daily., Disp: , Rfl:  .  cholecalciferol (VITAMIN D3) 25 MCG (1000 UT) tablet, Take  1,000 Units by mouth daily. (Patient not taking: Reported on 06/16/2019), Disp: , Rfl:  There are no  discontinued medications.  I have personally reviewed and addressed the Medicare Annual Wellness health risk assessment questionnaire and have noted the following in the patient's chart:  A.         Medical and social history & family history B.         Use of alcohol, tobacco or illicit drugs  C.         Current medications and supplements D.         Functional and Cognitive ability and status E.         Nutritional status F.         Physical activity G.        Advance directives H.         List of other physicians I.          Hospitalizations, surgeries, and ER visits in previous 12 months J.         [place] such as hearing and vision if needed, cognitive and depression L.         Referrals and appointments   In addition, I have reviewed and discussed with patient certain preventive protocols, quality metrics, and best practice recommendations. A written personalized care plan for preventive services as well as general preventive health recommendations were provided to patient.   See attached scanned questionnaire for additional information.

## 2019-09-06 ENCOUNTER — Other Ambulatory Visit: Payer: Self-pay

## 2019-09-06 ENCOUNTER — Encounter: Payer: Self-pay | Admitting: Family Medicine

## 2019-09-06 ENCOUNTER — Ambulatory Visit (INDEPENDENT_AMBULATORY_CARE_PROVIDER_SITE_OTHER): Payer: Medicare Other | Admitting: Family Medicine

## 2019-09-06 VITALS — BP 120/80 | HR 104 | Temp 97.9°F | Resp 16 | Ht 74.0 in | Wt 296.3 lb

## 2019-09-06 DIAGNOSIS — Z0000 Encounter for general adult medical examination without abnormal findings: Secondary | ICD-10-CM | POA: Diagnosis not present

## 2019-09-09 ENCOUNTER — Telehealth: Payer: Self-pay

## 2019-09-09 NOTE — Telephone Encounter (Signed)
Pt wife called to state you were asking husband about urologist.  She states just saw Dr. Milford Cage at [facility] [place] and he did not need to see him again for a year

## 2019-09-16 ENCOUNTER — Ambulatory Visit: Payer: Medicare Other | Admitting: Urology

## 2019-09-27 ENCOUNTER — Ambulatory Visit: Payer: Medicare Other | Admitting: Urology

## 2019-10-05 ENCOUNTER — Other Ambulatory Visit: Payer: Self-pay

## 2019-10-05 ENCOUNTER — Ambulatory Visit (INDEPENDENT_AMBULATORY_CARE_PROVIDER_SITE_OTHER): Payer: Medicare Other

## 2019-10-05 ENCOUNTER — Other Ambulatory Visit: Payer: Self-pay | Admitting: Family Medicine

## 2019-10-05 ENCOUNTER — Telehealth: Payer: Self-pay

## 2019-10-05 DIAGNOSIS — Z23 Encounter for immunization: Secondary | ICD-10-CM | POA: Diagnosis not present

## 2019-10-05 DIAGNOSIS — M545 Low back pain, unspecified: Secondary | ICD-10-CM

## 2019-10-17 ENCOUNTER — Other Ambulatory Visit: Payer: Self-pay | Admitting: Family Medicine

## 2019-10-17 DIAGNOSIS — R61 Generalized hyperhidrosis: Secondary | ICD-10-CM

## 2019-10-22 ENCOUNTER — Other Ambulatory Visit: Payer: Self-pay | Admitting: Family Medicine

## 2019-10-22 DIAGNOSIS — J301 Allergic rhinitis due to pollen: Secondary | ICD-10-CM

## 2019-10-22 NOTE — Telephone Encounter (Signed)
Requested Prescriptions  Pending Prescriptions Disp Refills   fluticasone (FLONASE) 50 MCG/ACT nasal spray [Pharmacy Med Name: FLUTICASONE PROP 50 MCG SPRAY] 16 mL 3    Sig: SPRAY 2 SPRAYS INTO EACH NOSTRIL EVERY DAY     Ear, Nose, and Throat: Nasal Preparations - Corticosteroids Passed - 10/22/2019  9:31 AM      Passed - Valid encounter within last 12 months    Recent Outpatient Visits          1 month ago Medicare annual wellness visit, initial   [facility] [place], Drue Stager, MD   4 months ago Atherosclerosis of abdominal aorta [facility])   [facility] Steele Sizer, MD   10 months ago Atherosclerosis of abdominal aorta [facility])   [facility] Steele Sizer, MD   1 year ago Gastroesophageal reflux disease without esophagitis   [facility] Steele Sizer, MD   1 year ago [facility]   [place], NP      Future Appointments            In 1 month Ancil Boozer, Drue Stager, MD [facility], [place]   In 5 months Ralene Bathe, MD [place]

## 2019-11-18 NOTE — Telephone Encounter (Signed)
Completed.

## 2019-11-28 DIAGNOSIS — N1831 Chronic kidney disease, stage 3a: Secondary | ICD-10-CM | POA: Diagnosis not present

## 2019-11-28 DIAGNOSIS — D631 Anemia in chronic kidney disease: Secondary | ICD-10-CM | POA: Diagnosis not present

## 2019-11-28 DIAGNOSIS — E875 Hyperkalemia: Secondary | ICD-10-CM | POA: Diagnosis not present

## 2019-11-28 DIAGNOSIS — N2581 Secondary hyperparathyroidism of renal origin: Secondary | ICD-10-CM | POA: Diagnosis not present

## 2019-12-15 NOTE — Progress Notes (Signed)
Name: [name]   MRN: [id number]    DOB: Nov 26, 1950   Date:12/16/2019       Progress Note  Subjective  Chief Complaint  Follow up   HPI   Morbid Obese: he was up to 303 lbs May 2020 , it was down to 287 lbs and today is up again at 194 lbs. He states he has been a little more stressed at work and is eating more. He has BMI above 35 with co-morbidities. GERD, OA, Dyslipidemia. He has been drinking more water and weight is stable.   Atherosclerosis of aorta: on Eliquis and atorvastatin , we will recheck labs next visit   Asthma Moderate: he is using Trelegy and singulair daily, no longer using rescue inhaler daily , using it prn a few times a week. He has a mild cough, but only occasionally wheezing and SOB, he does not want to see pulmonologist   History of anemia: but seen by hematologistlevel back in Nov 2019 and Hgb  was11.5 . Had blood transfusion after his left hip replacement surgery Dec 2020. His HCT has been stable, iron storage back to normal   CKI stage III: under the care of Dr. Holley Raring. No pruritus.Good urine output  last GFR  at his office was on Nov 2021 was 26, normal parathyroid , anemia of chronic disease is stable.   Dyslipidemia: he is not sure if he is taking Atorvastatin, last LDL showed significant improvement down from 108 to 79 , no myalgias   Chronic DVT: on Eliquis, no side effects of medication , no easy bruising. No leg pain or swelling with ambulation. Insurance will stop paying for eliquis next year, discussed PA once the insurance denies   GERD and eructation: he continues to have bloating and burping, mild GERD, taking Pepcid, advised to stop drinking from a straw, not to chew gum, but no longer bothering him like it was  BPHED: he is taking cialis daily and also flomax, still getting up multiple times with nocturia. He is taking medications, Cialis 5 mg is not working for ED, we added 10 mg but it made him flush so we will change to 5  mgt to take one daily and add extra 5 mg every 3 days prn intercourse Explained elevation of PSA and we will recheck today to monitor, may need to see Urologist   Rash left hand: he states present for over 20 years, but now teaching middle school and also having to wash hands more often due to pandemic and skin has been cracking and kids keep asking him what is wrong with his hand, he would like to see dermatologist   AR: he has been taking singulair, Allegra 180 mg daily and sometimes Allegra D. Also using eye drops to control itching and watery eyes. Unchanged    Patient Active Problem List   Diagnosis Date Noted  . Osteoarthritis, multiple sites 06/16/2019  . Morbid obesity ([place]) 06/16/2019  . Chronic deep vein thrombosis (DVT) of calf muscle vein of left lower extremity ([place]) 11/30/2017  . Atherosclerosis of abdominal aorta ([place]) 09/15/2017  . Chronic deep vein thrombosis (DVT) of left popliteal vein ([place]) 06/26/2017  . History of benign neoplasm of adrenal gland 02/11/2016  . History of iron deficiency anemia 10/10/2015  . BPH (benign prostatic hyperplasia) 06/20/2015  . ED (erectile dysfunction) 06/20/2015  . Allergic rhinitis, seasonal 06/20/2015  . Anemia of chronic disease 06/20/2015  . Hypogonadism in male 06/20/2015  . Asthma, well controlled, moderate  persistent 06/20/2015  . Hypertension, benign 06/20/2015  . Hyperglycemia 06/20/2015  . History of shingles 06/20/2015  . GERD without esophagitis 06/20/2015  . Chronic radicular low back pain 06/20/2015  . History of epilepsy 06/20/2015  . Migraine without aura and without status migrainosus, not intractable 06/20/2015  . Dyslipidemia 06/20/2015  . Primary osteoarthritis of both knees 06/20/2015    Past Surgical History:  Procedure Laterality Date  . ADRENALECTOMY Left 02/11/2016   [facility]  . COLONOSCOPY  02/2012   normal  . JOINT REPLACEMENT    . KNEE ARTHROSCOPY Left 10/06/2009  . SINUS EXPLORATION    . TOTAL HIP  ARTHROPLASTY Left 12/14/2017   Procedure: TOTAL HIP ARTHROPLASTY ANTERIOR APPROACH;  Surgeon: Dorna Leitz, MD;  Location: [facility];  Service: Orthopedics;  Laterality: Left;  . TOTAL KNEE ARTHROPLASTY Right 06/20/2016  . TOTAL KNEE ARTHROPLASTY Right 06/20/2016   Procedure: TOTAL KNEE ARTHROPLASTY;  Surgeon: Dorna Leitz, MD;  Location: [facility];  Service: Orthopedics;  Laterality: Right;  . TOTAL KNEE ARTHROPLASTY Left 12/19/2016   Procedure: LEFT TOTAL KNEE ARTHROPLASTY;  Surgeon: Dorna Leitz, MD;  Location: [facility];  Service: Orthopedics;  Laterality: Left;  Adductor Block    Family History  Problem Relation Age of Onset  . Diabetes Mother   . Heart disease Mother   . Lung disease Mother   . Seizures Maternal Grandmother   . Alzheimer's disease Brother     Social History   Tobacco Use  . Smoking status: Former Smoker    Packs/day: 1.00    Years: 10.00    Pack years: 10.00    Types: Cigarettes    Quit date: 1980    Years since quitting: 41.9  . Smokeless tobacco: Never Used  . Tobacco comment: 38 years ago 54 when he stopped  Substance Use Topics  . Alcohol use: No    Alcohol/week: 0.0 standard drinks     Current Outpatient Medications:  .  albuterol (VENTOLIN HFA) 108 (90 Base) MCG/ACT inhaler, Inhale 2 puffs into the lungs every 4 (four) hours as needed for wheezing or shortness of breath., Disp: 8.5 g, Rfl: 0 .  apixaban (ELIQUIS) 5 MG TABS tablet, Take 1 tablet (5 mg total) by mouth 2 (two) times daily., Disp: 60 tablet, Rfl: 5 .  atorvastatin (LIPITOR) 20 MG tablet, Take 1 tablet (20 mg total) by mouth daily., Disp: 30 tablet, Rfl: 5 .  azelastine (OPTIVAR) 0.05 % ophthalmic solution, Place 2 drops into both eyes 2 (two) times daily., Disp: 6 mL, Rfl: 5 .  famotidine (PEPCID) 20 MG tablet, Take 1 tablet (20 mg total) by mouth 2 (two) times daily as needed for heartburn or indigestion., Disp: 60 tablet, Rfl: 5 .  fexofenadine-pseudoephedrine (ALLEGRA-D 24) 180-240 MG 24 hr  tablet, Take 1 tablet by mouth daily as needed (for allergies.)., Disp: 30 tablet, Rfl: 2 .  fluticasone (FLONASE) 50 MCG/ACT nasal spray, SPRAY 2 SPRAYS INTO EACH NOSTRIL EVERY DAY, Disp: 16 mL, Rfl: 3 .  Fluticasone-Umeclidin-Vilant (TRELEGY ELLIPTA) 100-62.5-25 MCG/INH AEPB, Inhale 1 puff into the lungs daily., Disp: 60 each, Rfl: 5 .  MOBIC 15 MG tablet, Take 15 mg by mouth daily., Disp: , Rfl:  .  montelukast (SINGULAIR) 10 MG tablet, Take 1 tablet (10 mg total) by mouth daily., Disp: 30 tablet, Rfl: 5 .  Multiple Vitamins-Minerals (MULTIVITAMIN ADULTS 50+ PO), Take 1 tablet by mouth daily. NATURE'S CODE MEN OVER 50 MULTIVITAMIN PACK, Disp: , Rfl:  .  patiromer (VELTASSA) 8.4 g  packet, Take 1 packet by mouth daily., Disp: , Rfl:  .  Polyethyl Glycol-Propyl Glycol 0.4-0.3 % SOLN, Place 1-2 drops into both eyes 3 (three) times daily as needed (for dry/irritated eyes.)., Disp: , Rfl:  .  tadalafil (CIALIS) 10 MG tablet, Take 1 tablet (10 mg total) by mouth every three (3) days as needed for erectile dysfunction., Disp: 30 tablet, Rfl: 0 .  tadalafil (CIALIS) 5 MG tablet, Take 1 tablet (5 mg total) by mouth daily., Disp: 30 tablet, Rfl: 5 .  tamsulosin (FLOMAX) 0.4 MG CAPS capsule, Take 1 capsule (0.4 mg total) by mouth every evening., Disp: 30 capsule, Rfl: 5 .  tiZANidine (ZANAFLEX) 2 MG tablet, TAKE 1 TABLET (2 MG TOTAL) BY MOUTH 3 (THREE) TIMES DAILY., Disp: 90 tablet, Rfl: 1 .  vitamin B-12 (CYANOCOBALAMIN) 500 MCG tablet, Take 500 mcg by mouth daily., Disp: , Rfl:  .  cholecalciferol (VITAMIN D3) 25 MCG (1000 UT) tablet, Take 1,000 Units by mouth daily. (Patient not taking: No sig reported), Disp: , Rfl:   Allergies  Allergen Reactions  . Almond (Diagnostic) Other (See Comments)    Migraines  . Lactose Intolerance (Gi) Other (See Comments)    MIGRAINES  . Peanut-Containing Drug Products Other (See Comments)    Migraines  . Shellfish Allergy Other (See Comments)    Congestion/breathing  problems/migraines.    I personally reviewed active problem list, medication list, allergies, family history, social history, health maintenance, notes from last encounter with the patient/caregiver today.   ROS   Constitutional: Negative for fever or weight change.  Respiratory: positive for intermittent  cough and shortness of breath.   Cardiovascular: Negative for chest pain or palpitations.  Gastrointestinal: Negative for abdominal pain, no bowel changes.  Musculoskeletal: Negative for gait problem or joint swelling.  Skin: positive  for rash.  Neurological: Negative for dizziness or headache.  No other specific complaints in a complete review of systems (except as listed in HPI above).  Objective  Vitals:   12/16/19 1552  BP: 122/74  Pulse: 93  Resp: 20  Temp: 98 F (36.7 C)  TempSrc: Oral  SpO2: 96%  Weight: 295 lb 9.6 oz (134.1 kg)  Height: 6\' 2"  (1.88 m)    Body mass index is 37.95 kg/m.  Physical Exam  Constitutional: Patient appears well-developed and well-nourished. ObeseNo distress.  HEENT: head atraumatic, normocephalic, pupils equal and reactive to light, neck supple Cardiovascular: Normal rate, regular rhythm and normal heart sounds.  No murmur heard. No BLE edema. Pulmonary/Chest: Effort normal and breath sounds normal. No respiratory distress. Abdominal: Soft.  There is no tenderness. Skin: dry , cracking skin on left palm, some irritation on thumb from picking on skin, no signs of secondary infections  Psychiatric: Patient has a normal mood and affect. behavior is normal. Judgment and thought content normal. PHQ2/9: Depression screen [facility] 2/9 12/16/2019 09/06/2019 06/16/2019 12/15/2018 06/22/2018  Decreased Interest 0 0 0 0 0  Down, Depressed, Hopeless 0 0 0 0 0  PHQ - 2 Score 0 0 0 0 0  Altered sleeping - - 0 0 0  Tired, decreased energy - - 0 0 0  Change in appetite - - 0 0 0  Feeling bad or failure about yourself  - - 0 0 0  Trouble concentrating -  - 0 0 0  Moving slowly or fidgety/restless - - 0 0 0  Suicidal thoughts - - 0 0 0  PHQ-9 Score - - 0 0 0  Difficult doing  work/chores - - - - -    phq 9 is negative   Fall Risk: Fall Risk  12/16/2019 09/06/2019 06/16/2019 12/15/2018 06/22/2018  Falls in the past year? 0 0 0 0 0  Number falls in past yr: 0 0 0 0 0  Injury with Fall? 0 0 0 0 0     Functional Status Survey: Is the patient deaf or have difficulty hearing?: No Does the patient have difficulty seeing, even when wearing glasses/contacts?: No Does the patient have difficulty concentrating, remembering, or making decisions?: No Does the patient have difficulty walking or climbing stairs?: No Does the patient have difficulty dressing or bathing?: No Does the patient have difficulty doing errands alone such as visiting a doctor's office or shopping?: No    Assessment & Plan  1. Atherosclerosis of abdominal aorta (HCC)  - atorvastatin (LIPITOR) 20 MG tablet; Take 1 tablet (20 mg total) by mouth daily.  Dispense: 30 tablet; Refill: 5  2. Benign prostatic hyperplasia with urinary obstruction  - tamsulosin (FLOMAX) 0.4 MG CAPS capsule; Take 1 capsule (0.4 mg total) by mouth every evening.  Dispense: 30 capsule; Refill: 5  3. Erectile dysfunction, unspecified erectile dysfunction type  - tadalafil (CIALIS) 5 MG tablet; Take 1-2 tablets (5-10 mg total) by mouth daily. Take half daily and extra half every 3 days prn ED  Dispense: 90 tablet; Refill: 0  4. Asthma, well controlled, moderate persistent  - montelukast (SINGULAIR) 10 MG tablet; Take 1 tablet (10 mg total) by mouth daily.  Dispense: 30 tablet; Refill: 5 - Fluticasone-Umeclidin-Vilant (TRELEGY ELLIPTA) 100-62.5-25 MCG/INH AEPB; Inhale 1 puff into the lungs daily.  Dispense: 60 each; Refill: 5 - albuterol (VENTOLIN HFA) 108 (90 Base) MCG/ACT inhaler; Inhale 2 puffs into the lungs every 4 (four) hours as needed for wheezing or shortness of breath.  Dispense: 8.5 g;  Refill: 0  5. Gastroesophageal reflux disease without esophagitis  - famotidine (PEPCID) 20 MG tablet; Take 1 tablet (20 mg total) by mouth 2 (two) times daily as needed for heartburn or indigestion.  Dispense: 60 tablet; Refill: 5  6. Chronic deep vein thrombosis (DVT) of calf muscle vein of left lower extremity (HCC)  - apixaban (ELIQUIS) 5 MG TABS tablet; Take 1 tablet (5 mg total) by mouth 2 (two) times daily.  Dispense: 60 tablet; Refill: 1  7. Rash of hand  - Ambulatory referral to Dermatology  8. Elevated PSA  - PSA

## 2019-12-16 ENCOUNTER — Other Ambulatory Visit: Payer: Self-pay

## 2019-12-16 ENCOUNTER — Ambulatory Visit (INDEPENDENT_AMBULATORY_CARE_PROVIDER_SITE_OTHER): Payer: Medicare Other | Admitting: Family Medicine

## 2019-12-16 ENCOUNTER — Encounter: Payer: Self-pay | Admitting: Family Medicine

## 2019-12-16 VITALS — BP 122/74 | HR 93 | Temp 98.0°F | Resp 20 | Ht 74.0 in | Wt 295.6 lb

## 2019-12-16 DIAGNOSIS — I700 Atherosclerosis of aorta: Secondary | ICD-10-CM | POA: Diagnosis not present

## 2019-12-16 DIAGNOSIS — R21 Rash and other nonspecific skin eruption: Secondary | ICD-10-CM

## 2019-12-16 DIAGNOSIS — N401 Enlarged prostate with lower urinary tract symptoms: Secondary | ICD-10-CM | POA: Diagnosis not present

## 2019-12-16 DIAGNOSIS — N529 Male erectile dysfunction, unspecified: Secondary | ICD-10-CM | POA: Diagnosis not present

## 2019-12-16 DIAGNOSIS — J4540 Moderate persistent asthma, uncomplicated: Secondary | ICD-10-CM | POA: Diagnosis not present

## 2019-12-16 DIAGNOSIS — K219 Gastro-esophageal reflux disease without esophagitis: Secondary | ICD-10-CM

## 2019-12-16 DIAGNOSIS — I82562 Chronic embolism and thrombosis of left calf muscular vein: Secondary | ICD-10-CM

## 2019-12-16 DIAGNOSIS — R972 Elevated prostate specific antigen [PSA]: Secondary | ICD-10-CM

## 2019-12-16 DIAGNOSIS — N138 Other obstructive and reflux uropathy: Secondary | ICD-10-CM

## 2019-12-16 MED ORDER — FAMOTIDINE 20 MG PO TABS: 20.0000 mg | ORAL_TABLET | Freq: Two times a day (BID) | ORAL | 5 refills | Status: DC | PRN

## 2019-12-16 MED ORDER — MONTELUKAST SODIUM 10 MG PO TABS: 10.0000 mg | ORAL_TABLET | Freq: Every day | ORAL | 5 refills | Status: DC

## 2019-12-16 MED ORDER — TAMSULOSIN HCL 0.4 MG PO CAPS: 0.4000 mg | ORAL_CAPSULE | Freq: Every evening | ORAL | 5 refills | Status: DC

## 2019-12-16 MED ORDER — APIXABAN 5 MG PO TABS: 5.0000 mg | ORAL_TABLET | Freq: Two times a day (BID) | ORAL | 1 refills | Status: DC

## 2019-12-16 MED ORDER — TADALAFIL 5 MG PO TABS: 5.0000 mg | ORAL_TABLET | Freq: Every day | ORAL | 0 refills | Status: DC

## 2019-12-16 MED ORDER — ATORVASTATIN CALCIUM 20 MG PO TABS: 20.0000 mg | ORAL_TABLET | Freq: Every day | ORAL | 5 refills | Status: DC

## 2019-12-16 MED ORDER — ALBUTEROL SULFATE HFA 108 (90 BASE) MCG/ACT IN AERS: 2.0000 | INHALATION_SPRAY | RESPIRATORY_TRACT | 0 refills | Status: DC | PRN

## 2019-12-16 MED ORDER — TRELEGY ELLIPTA 100-62.5-25 MCG/INH IN AEPB: 1.0000 | INHALATION_SPRAY | Freq: Every day | RESPIRATORY_TRACT | 5 refills | Status: DC

## 2019-12-17 LAB — PSA: PSA: 2.71 ng/mL (ref <=4.0)

## 2019-12-20 ENCOUNTER — Telehealth: Payer: Self-pay | Admitting: Family Medicine

## 2019-12-20 NOTE — Telephone Encounter (Signed)
Pharmacist called in to get clarification on directions for pt's Rx for tadalafil (CIALIS) 5 MG tablet  [phone]- April

## 2019-12-21 NOTE — Telephone Encounter (Signed)
Called pharmacy and gave direction clarification.

## 2019-12-23 ENCOUNTER — Other Ambulatory Visit: Payer: Self-pay | Admitting: Family Medicine

## 2019-12-23 DIAGNOSIS — M545 Low back pain, unspecified: Secondary | ICD-10-CM

## 2020-01-15 ENCOUNTER — Other Ambulatory Visit: Payer: Self-pay | Admitting: Family Medicine

## 2020-01-15 DIAGNOSIS — M545 Low back pain, unspecified: Secondary | ICD-10-CM

## 2020-01-15 NOTE — Telephone Encounter (Signed)
Requested medication (s) are due for refill today: Yes  Requested medication (s) are on the active medication list: Yes  Last refill:  07/27/19  Future visit scheduled: Yes  Notes to clinic:  Unable to refill per protocol, cannot delegate     Requested Prescriptions  Pending Prescriptions Disp Refills   tiZANidine (ZANAFLEX) 2 MG tablet [Pharmacy Med Name: TIZANIDINE HCL 2 MG TABLET] 90 tablet 1    Sig: TAKE 1 TABLET (2 MG TOTAL) BY MOUTH 3 (THREE) TIMES DAILY.      Not Delegated - Cardiovascular:  Alpha-2 Agonists - tizanidine Failed - 01/15/2020  9:16 AM      Failed - This refill cannot be delegated      Passed - Valid encounter within last 6 months    Recent Outpatient Visits           1 month ago Atherosclerosis of abdominal aorta [facility])   [facility] Steele Sizer, MD   4 months ago Medicare annual wellness visit, initial   [facility] [place], Drue Stager, MD   7 months ago Atherosclerosis of abdominal aorta [facility])   [facility] Steele Sizer, MD   1 year ago Atherosclerosis of abdominal aorta [facility])   [facility] Steele Sizer, MD   1 year ago Gastroesophageal reflux disease without esophagitis   [facility] Steele Sizer, MD       Future Appointments             In 2 months Ralene Bathe, MD [place]   In 5 months Steele Sizer, MD [facility], [facility]

## 2020-01-19 ENCOUNTER — Other Ambulatory Visit: Payer: Self-pay

## 2020-01-19 NOTE — Telephone Encounter (Signed)
Requested medication (s) are due for refill today: yes  Requested medication (s) are on the active medication list:yes   Last refill: 10/05/2019  Future visit scheduled: yes  Notes to clinic:  this refill cannot be delegated    Requested Prescriptions  Pending Prescriptions Disp Refills   tiZANidine (ZANAFLEX) 2 MG tablet 90 tablet 1    Sig: Take 1 tablet (2 mg total) by mouth 3 (three) times daily.      Not Delegated - Cardiovascular:  Alpha-2 Agonists - tizanidine Failed - 01/19/2020 11:12 AM      Failed - This refill cannot be delegated      Passed - Valid encounter within last 6 months    Recent Outpatient Visits           1 month ago Atherosclerosis of abdominal aorta [facility])   [facility] Steele Sizer, MD   4 months ago Medicare annual wellness visit, initial   [facility] [place], Drue Stager, MD   7 months ago Atherosclerosis of abdominal aorta [facility])   [facility] Steele Sizer, MD   1 year ago Atherosclerosis of abdominal aorta [facility])   [facility] Steele Sizer, MD   1 year ago Gastroesophageal reflux disease without esophagitis   [facility] Steele Sizer, MD       Future Appointments             In 2 months Ralene Bathe, MD [place]   In 5 months Steele Sizer, MD [facility]              Refused Prescriptions Disp Refills   tiZANidine (ZANAFLEX) 2 MG tablet [Pharmacy Med Name: TIZANIDINE HCL 2 MG TABLET] 90 tablet 1    Sig: TAKE 1 TABLET (2 MG TOTAL) BY MOUTH 3 (THREE) TIMES DAILY.      Not Delegated - Cardiovascular:  Alpha-2 Agonists - tizanidine Failed - 01/19/2020 11:12 AM      Failed - This refill cannot be delegated      Passed - Valid encounter within last 6 months    Recent Outpatient Visits           1 month ago Atherosclerosis of abdominal aorta [facility])   [facility] Steele Sizer, MD   4 months ago Medicare annual wellness visit, initial   [facility] [place], Drue Stager, MD   7 months ago Atherosclerosis of abdominal aorta [facility])   [facility] Steele Sizer, MD   1 year ago Atherosclerosis of abdominal aorta [facility])   [facility] Steele Sizer, MD   1 year ago Gastroesophageal reflux disease without esophagitis   [facility] Steele Sizer, MD       Future Appointments             In 2 months Ralene Bathe, MD [place]   In 5 months Steele Sizer, MD [facility], [facility]

## 2020-01-19 NOTE — Telephone Encounter (Signed)
Medication:tiZANidine (ZANAFLEX) 2 MG tablet  Has the pt contacted their pharmacy? YES Preferred pharmacy: [id number] [place]  Please be advised refills may take up to 3 business days.  We ask that you follow up with your pharmacy.  Wife calling because she does not understand why this was denied. Pt takes 3 times a day. Please advise.

## 2020-01-19 NOTE — Addendum Note (Signed)
Addended by: Jefferson Fuel on: 01/19/2020 11:12 AM   Modules accepted: Orders

## 2020-01-20 ENCOUNTER — Other Ambulatory Visit: Payer: Self-pay | Admitting: Family Medicine

## 2020-01-20 DIAGNOSIS — M545 Low back pain, unspecified: Secondary | ICD-10-CM

## 2020-01-20 NOTE — Telephone Encounter (Signed)
Requested medication (s) are due for refill today: yes  Requested medication (s) are on the active medication list: yes Last refill:  10/05/2019  Future visit scheduled: yes  Notes to clinic: this refill cannot be delegated    Requested Prescriptions  Pending Prescriptions Disp Refills   tiZANidine (ZANAFLEX) 2 MG tablet 90 tablet 1    Sig: Take 1 tablet (2 mg total) by mouth 3 (three) times daily.      Not Delegated - Cardiovascular:  Alpha-2 Agonists - tizanidine Failed - 01/20/2020 12:06 PM      Failed - This refill cannot be delegated      Passed - Valid encounter within last 6 months    Recent Outpatient Visits           1 month ago Atherosclerosis of abdominal aorta [facility])   [facility] Steele Sizer, MD   4 months ago Medicare annual wellness visit, initial   [facility] [place], Drue Stager, MD   7 months ago Atherosclerosis of abdominal aorta [facility])   [facility] Steele Sizer, MD   1 year ago Atherosclerosis of abdominal aorta [facility])   [facility] Steele Sizer, MD   1 year ago Gastroesophageal reflux disease without esophagitis   [facility] Steele Sizer, MD       Future Appointments             In 2 months Ralene Bathe, MD [place]   In 5 months Steele Sizer, MD [facility], [facility]

## 2020-01-20 NOTE — Telephone Encounter (Signed)
I spoke to [name] on patients behalf and she said [name] was going to have Dr. Ancil Boozer take a look at the refill request before she leaves today.   I relayed that to the patients wife.

## 2020-01-20 NOTE — Telephone Encounter (Signed)
Medication: tiZANidine (ZANAFLEX) 2 MG tablet [[id number]]   Has the patient contacted their pharmacy? YES  (Agent: If no, request that the patient contact the pharmacy for the refill.) (Agent: If yes, when and what did the pharmacy advise?)  Preferred Pharmacy (with phone number or street name): [id number] [place], [address], [place] [address]  Phone:  [phone] Fax:  [phone]   Patient is requesting call back when this is completed [phone]  Agent: Please be advised that RX refills may take up to 3 business days. We ask that you follow-up with your pharmacy.

## 2020-01-24 ENCOUNTER — Other Ambulatory Visit: Payer: Self-pay

## 2020-01-24 MED ORDER — TIZANIDINE HCL 2 MG PO TABS: 2.0000 mg | ORAL_TABLET | Freq: Three times a day (TID) | ORAL | 0 refills | Status: DC

## 2020-01-24 NOTE — Addendum Note (Signed)
Addended by: Steele Sizer F on: 01/24/2020 04:33 PM   Modules accepted: Orders

## 2020-01-24 NOTE — Telephone Encounter (Signed)
Spoke with pt wife Neoma Laming. She stated that the prescription was last picked up on 11.24.2021 with no refills. The directions says to take 1 tablet by mouth 3x daily with a quantity of 90.  The rx #: [id number] and it was refilled by Dr Ancil Boozer.  Pt called the pharmacy and they told her that they had no refill on file. Pt not understanding why we are saying it is to soon for refill.  Please call pt to discuss

## 2020-01-25 NOTE — Telephone Encounter (Signed)
Lvm with patient

## 2020-02-18 ENCOUNTER — Other Ambulatory Visit: Payer: Self-pay | Admitting: Family Medicine

## 2020-02-18 DIAGNOSIS — M545 Low back pain, unspecified: Secondary | ICD-10-CM

## 2020-02-18 NOTE — Telephone Encounter (Signed)
Requested medication (s) are due for refill today:  Provider to determine  Requested medication (s) are on the active medication list:   Yes  Future visit scheduled:   Yes   Last ordered: 01/24/2020 #90, 0 refills  Clinic note:  Returned because this is a non delegated refill   Requested Prescriptions  Pending Prescriptions Disp Refills   tiZANidine (ZANAFLEX) 2 MG tablet [Pharmacy Med Name: TIZANIDINE HCL 2 MG TABLET] 90 tablet 0    Sig: TAKE 1 TABLET BY MOUTH 3 TIMES DAILY.      Not Delegated - Cardiovascular:  Alpha-2 Agonists - tizanidine Failed - 02/18/2020  9:01 AM      Failed - This refill cannot be delegated      Passed - Valid encounter within last 6 months    Recent Outpatient Visits           2 months ago Atherosclerosis of abdominal aorta [facility])   [facility] Steele Sizer, MD   5 months ago Medicare annual wellness visit, initial   [facility] [place], Drue Stager, MD   8 months ago Atherosclerosis of abdominal aorta [facility])   [facility] Steele Sizer, MD   1 year ago Atherosclerosis of abdominal aorta [facility])   [facility] Sizer, MD   1 year ago Gastroesophageal reflux disease without esophagitis   [facility] Steele Sizer, MD       Future Appointments             In 1 month Ralene Bathe, MD [place]   In 4 months Steele Sizer, MD [facility], [facility]

## 2020-02-20 ENCOUNTER — Other Ambulatory Visit: Payer: Self-pay

## 2020-03-15 ENCOUNTER — Other Ambulatory Visit: Payer: Self-pay | Admitting: Family Medicine

## 2020-03-15 DIAGNOSIS — M545 Low back pain, unspecified: Secondary | ICD-10-CM

## 2020-03-15 NOTE — Telephone Encounter (Signed)
Requested medication (s) are due for refill today: yes  Requested medication (s) are on the active medication list: yes  Last refill:  01/24/20 #90 0 refills  Future visit scheduled: yes  Notes to clinic:  not delegated per protocol     Requested Prescriptions  Pending Prescriptions Disp Refills   tiZANidine (ZANAFLEX) 2 MG tablet [Pharmacy Med Name: TIZANIDINE HCL 2 MG TABLET] 90 tablet 0    Sig: TAKE 1 TABLET BY MOUTH 3 TIMES DAILY.      Not Delegated - Cardiovascular:  Alpha-2 Agonists - tizanidine Failed - 03/15/2020  5:40 PM      Failed - This refill cannot be delegated      Passed - Valid encounter within last 6 months    Recent Outpatient Visits           3 months ago Atherosclerosis of abdominal aorta [facility])   [facility] Steele Sizer, MD   6 months ago Medicare annual wellness visit, initial   [facility] [place], Drue Stager, MD   9 months ago Atherosclerosis of abdominal aorta [facility])   [facility] Steele Sizer, MD   1 year ago Atherosclerosis of abdominal aorta [facility])   [facility] Steele Sizer, MD   1 year ago Gastroesophageal reflux disease without esophagitis   [facility] Steele Sizer, MD       Future Appointments             In 4 weeks Ralene Bathe, MD [place]   In 3 months Steele Sizer, MD [facility], [facility]

## 2020-03-16 ENCOUNTER — Other Ambulatory Visit: Payer: Self-pay

## 2020-03-16 DIAGNOSIS — M545 Low back pain, unspecified: Secondary | ICD-10-CM

## 2020-04-07 ENCOUNTER — Other Ambulatory Visit: Payer: Self-pay | Admitting: Family Medicine

## 2020-04-07 DIAGNOSIS — M545 Low back pain, unspecified: Secondary | ICD-10-CM

## 2020-04-07 NOTE — Telephone Encounter (Signed)
Requested medication (s) are due for refill today: yes  Requested medication (s) are on the active medication list: yes  Last refill:  03/16/20  Future visit scheduled: yes  Notes to clinic:  RF not delegated to NT to RF   Requested Prescriptions  Pending Prescriptions Disp Refills   tiZANidine (ZANAFLEX) 2 MG tablet [Pharmacy Med Name: TIZANIDINE HCL 2 MG TABLET] 90 tablet 0    Sig: TAKE 1 TABLET BY MOUTH THREE TIMES A DAY      Not Delegated - Cardiovascular:  Alpha-2 Agonists - tizanidine Failed - 04/07/2020  9:30 AM      Failed - This refill cannot be delegated      Passed - Valid encounter within last 6 months    Recent Outpatient Visits           3 months ago Atherosclerosis of abdominal aorta [facility])   [facility] Steele Sizer, MD   7 months ago Medicare annual wellness visit, initial   [facility] [place] Drue Stager, MD   9 months ago Atherosclerosis of abdominal aorta [facility])   [facility] Steele Sizer, MD   1 year ago Atherosclerosis of abdominal aorta [facility])   [facility] Steele Sizer, MD   1 year ago Gastroesophageal reflux disease without esophagitis   [facility] Steele Sizer, MD       Future Appointments             In 2 months Ancil Boozer, Drue Stager, MD [facility], [place]   In 3 months Ralene Bathe, MD [place]

## 2020-04-10 ENCOUNTER — Other Ambulatory Visit: Payer: Self-pay | Admitting: Family Medicine

## 2020-04-10 DIAGNOSIS — J3089 Other allergic rhinitis: Secondary | ICD-10-CM

## 2020-04-10 DIAGNOSIS — J302 Other seasonal allergic rhinitis: Secondary | ICD-10-CM

## 2020-04-10 DIAGNOSIS — J301 Allergic rhinitis due to pollen: Secondary | ICD-10-CM

## 2020-04-12 ENCOUNTER — Ambulatory Visit: Payer: Medicare Other | Admitting: Dermatology

## 2020-04-12 DIAGNOSIS — N2581 Secondary hyperparathyroidism of renal origin: Secondary | ICD-10-CM | POA: Diagnosis not present

## 2020-04-12 DIAGNOSIS — E875 Hyperkalemia: Secondary | ICD-10-CM | POA: Diagnosis not present

## 2020-04-12 DIAGNOSIS — N1831 Chronic kidney disease, stage 3a: Secondary | ICD-10-CM | POA: Diagnosis not present

## 2020-04-12 DIAGNOSIS — D631 Anemia in chronic kidney disease: Secondary | ICD-10-CM | POA: Diagnosis not present

## 2020-04-22 ENCOUNTER — Other Ambulatory Visit: Payer: Self-pay | Admitting: Family Medicine

## 2020-04-22 DIAGNOSIS — M545 Low back pain, unspecified: Secondary | ICD-10-CM

## 2020-04-22 NOTE — Telephone Encounter (Signed)
Requested medication (s) are due for refill today: no  Requested medication (s) are on the active medication list: yes  Last refill:  04/09/20  Future visit scheduled: yes  Notes to clinic:  med not delegated to NT to RF/pharmacy requesting 90 day refills   Requested Prescriptions  Pending Prescriptions Disp Refills   tiZANidine (ZANAFLEX) 2 MG tablet [Pharmacy Med Name: TIZANIDINE HCL 2 MG TABLET] 270 tablet 1    Sig: TAKE 1 TABLET BY MOUTH THREE TIMES A DAY      Not Delegated - Cardiovascular:  Alpha-2 Agonists - tizanidine Failed - 04/22/2020 12:48 PM      Failed - This refill cannot be delegated      Passed - Valid encounter within last 6 months    Recent Outpatient Visits           4 months ago Atherosclerosis of abdominal aorta [facility])   [facility] Steele Sizer, MD   7 months ago Medicare annual wellness visit, initial   [facility] [place], Drue Stager, MD   10 months ago Atherosclerosis of abdominal aorta [facility])   [facility] Steele Sizer, MD   1 year ago Atherosclerosis of abdominal aorta [facility])   [facility] Steele Sizer, MD   1 year ago Gastroesophageal reflux disease without esophagitis   [facility] Steele Sizer, MD       Future Appointments             In 2 months Ancil Boozer, Drue Stager, MD [facility], [place]   In 3 months Ralene Bathe, MD [place]

## 2020-04-23 NOTE — Telephone Encounter (Signed)
Pt has an appt 617/22

## 2020-04-26 ENCOUNTER — Other Ambulatory Visit: Payer: Self-pay | Admitting: Family Medicine

## 2020-04-26 DIAGNOSIS — N529 Male erectile dysfunction, unspecified: Secondary | ICD-10-CM

## 2020-05-07 DIAGNOSIS — S93401A Sprain of unspecified ligament of right ankle, initial encounter: Secondary | ICD-10-CM | POA: Diagnosis not present

## 2020-05-18 ENCOUNTER — Other Ambulatory Visit: Payer: Self-pay | Admitting: Family Medicine

## 2020-05-18 DIAGNOSIS — M545 Low back pain, unspecified: Secondary | ICD-10-CM

## 2020-05-18 NOTE — Telephone Encounter (Signed)
Last seen 12.10.2021 and next appt sch'd for 6.17.2022

## 2020-05-18 NOTE — Telephone Encounter (Signed)
Requested medication (s) are due for refill today:   Provider to review  Requested medication (s) are on the active medication list:   Yes  Future visit scheduled:   Yes   Last ordered: 04/09/2020 #90, 0 refills  Non delegated refill    Requested Prescriptions  Pending Prescriptions Disp Refills   tiZANidine (ZANAFLEX) 2 MG tablet [Pharmacy Med Name: TIZANIDINE HCL 2 MG TABLET] 90 tablet 0    Sig: TAKE 1 TABLET BY MOUTH THREE TIMES A DAY      Not Delegated - Cardiovascular:  Alpha-2 Agonists - tizanidine Failed - 05/18/2020 12:31 PM      Failed - This refill cannot be delegated      Passed - Valid encounter within last 6 months    Recent Outpatient Visits           5 months ago Atherosclerosis of abdominal aorta [facility])   [facility] Steele Sizer, MD   8 months ago Medicare annual wellness visit, initial   [facility] [place], Drue Stager, MD   11 months ago Atherosclerosis of abdominal aorta [facility])   [facility] Steele Sizer, MD   1 year ago Atherosclerosis of abdominal aorta [facility])   [facility] Steele Sizer, MD   1 year ago Gastroesophageal reflux disease without esophagitis   [facility] Steele Sizer, MD       Future Appointments             In 1 month Ancil Boozer, Drue Stager, MD [facility], [place]   In 2 months Ralene Bathe, MD [place]

## 2020-06-01 ENCOUNTER — Other Ambulatory Visit: Payer: Self-pay | Admitting: Family Medicine

## 2020-06-01 DIAGNOSIS — M545 Low back pain, unspecified: Secondary | ICD-10-CM

## 2020-06-01 NOTE — Telephone Encounter (Signed)
Requested medication (s) are due for refill today: yes  Requested medication (s) are on the active medication list: yes  Last refill:  05/18/20  Future visit scheduled: yes  Notes to clinic:  not delegated   Requested Prescriptions  Pending Prescriptions Disp Refills   tiZANidine (ZANAFLEX) 2 MG tablet [Pharmacy Med Name: TIZANIDINE HCL 2 MG TABLET] 270 tablet 1    Sig: TAKE 1 TABLET BY MOUTH THREE TIMES A DAY      Not Delegated - Cardiovascular:  Alpha-2 Agonists - tizanidine Failed - 06/01/2020  1:05 PM      Failed - This refill cannot be delegated      Passed - Valid encounter within last 6 months    Recent Outpatient Visits           5 months ago Atherosclerosis of abdominal aorta [facility])   [facility] Steele Sizer, MD   8 months ago Medicare annual wellness visit, initial   [facility] [place], Drue Stager, MD   11 months ago Atherosclerosis of abdominal aorta [facility])   [facility] Steele Sizer, MD   1 year ago Atherosclerosis of abdominal aorta [facility])   [facility] Steele Sizer, MD   1 year ago Gastroesophageal reflux disease without esophagitis   [facility] Sizer, MD       Future Appointments             In 3 weeks Steele Sizer, MD [facility] [place]   In 1 month Ralene Bathe, MD [place]

## 2020-06-05 NOTE — Telephone Encounter (Signed)
Pt verbally informed that prescription has been sent to pharmacy

## 2020-06-05 NOTE — Telephone Encounter (Signed)
Pt has an appt on 06/22/20

## 2020-06-15 ENCOUNTER — Telehealth: Payer: Self-pay

## 2020-06-15 ENCOUNTER — Encounter: Payer: Self-pay | Admitting: Oncology

## 2020-06-15 ENCOUNTER — Telehealth: Payer: Self-pay | Admitting: Family Medicine

## 2020-06-15 NOTE — Telephone Encounter (Deleted)
Pt wife Neoma Laming is calling to report her husband tested positive for covid yesterday with in home covid kit. Pt temp yesterday was 101.8 and before  bed  temp was 99.6. Pt has been taking dayquil cough med. Pt has a cough and sluggishness. Pt wife would like cassandra to call her back

## 2020-06-15 NOTE — Telephone Encounter (Signed)
Pt called and I transferred call to [facility] for advise

## 2020-06-15 NOTE — Telephone Encounter (Signed)
Pt wife called husband tested positive for covid.  He was informed to [facility] and take OTC meds for symptoms.  We had no available appointments but was told if got to feeling really bad temp that got to high or would no go down with meds or SOB should go to be seen by Urgent care [facility] walkin or ER.  Call back Monday if symptoms had not improved.

## 2020-06-15 NOTE — Telephone Encounter (Signed)
I sent clinical call to NT

## 2020-06-22 ENCOUNTER — Ambulatory Visit: Payer: Medicare Other | Admitting: Family Medicine

## 2020-06-26 ENCOUNTER — Ambulatory Visit: Payer: Medicare Other | Admitting: Unknown Physician Specialty

## 2020-06-26 ENCOUNTER — Other Ambulatory Visit: Payer: Self-pay | Admitting: Family Medicine

## 2020-06-26 DIAGNOSIS — I700 Atherosclerosis of aorta: Secondary | ICD-10-CM

## 2020-07-08 DIAGNOSIS — Z20822 Contact with and (suspected) exposure to covid-19: Secondary | ICD-10-CM | POA: Diagnosis not present

## 2020-07-18 ENCOUNTER — Other Ambulatory Visit: Payer: Self-pay | Admitting: Family Medicine

## 2020-07-18 DIAGNOSIS — M545 Low back pain, unspecified: Secondary | ICD-10-CM

## 2020-07-18 NOTE — Telephone Encounter (Signed)
Last seen 12.10.2021 next sch'd 8.1.2022

## 2020-07-19 ENCOUNTER — Encounter: Payer: Self-pay | Admitting: Oncology

## 2020-07-26 ENCOUNTER — Ambulatory Visit: Payer: Medicare Other | Admitting: Dermatology

## 2020-07-26 ENCOUNTER — Encounter: Payer: Self-pay | Admitting: Oncology

## 2020-07-26 ENCOUNTER — Other Ambulatory Visit: Payer: Self-pay

## 2020-07-26 ENCOUNTER — Ambulatory Visit (INDEPENDENT_AMBULATORY_CARE_PROVIDER_SITE_OTHER): Payer: Medicare Other | Admitting: Dermatology

## 2020-07-26 DIAGNOSIS — L811 Chloasma: Secondary | ICD-10-CM

## 2020-07-26 DIAGNOSIS — L409 Psoriasis, unspecified: Secondary | ICD-10-CM

## 2020-07-26 MED ORDER — VTAMA 1 % EX CREA: 1.0000 "application " | TOPICAL_CREAM | Freq: Two times a day (BID) | CUTANEOUS | 1 refills | Status: DC

## 2020-07-26 NOTE — Patient Instructions (Addendum)
Start Vtama twice daily to affected area at left palm.   Recommend taking Heliocare sun protection supplement daily in sunny weather for additional sun protection. For maximum protection on the sunniest days, you can take up to 2 capsules of regular Heliocare OR take 1 capsule of Heliocare Ultra. For prolonged exposure (such as a full day in the sun), you can repeat your dose of the supplement 4 hours after your first dose. Heliocare can be purchased at [facility] or at [email or web address].    Recommend daily broad spectrum sunscreen SPF 30+ to sun-exposed areas, reapply every 2 hours as needed. Call for new or changing lesions.  Staying in the shade or wearing long sleeves, sun glasses (UVA+UVB protection) and wide brim hats (4-inch brim around the entire circumference of the hat) are also recommended for sun protection.   Instructions for [facility] Medications  One or more of your medications was sent to the [facility] mail order compounding pharmacy. You will receive an email from them and can purchase the medicine through that link. It will then be mailed to your home at the address you confirmed. If for any reason you do not receive an email from them, please check your spam folder. If you still do not find the email, please let us know. [facility] phone number is [phone].   If you have any questions or concerns for your doctor, please call our main line at [phone] and press option 4 to reach your doctor's medical assistant. If no one answers, please leave a voicemail as directed and we will return your call as soon as possible. Messages left after 4 pm will be answered the following business day.   You may also send [place] a message via [place]. We typically respond to MyChart messages within 1-2 business days.  For prescription refills, please ask your pharmacy to contact our office. Our fax number is [phone].  If you have an urgent issue when the clinic is  closed that cannot wait until the next business day, you can page your doctor at the number below.    Please note that while we do our best to be available for urgent issues outside of office hours, we are not available 24/7.   If you have an urgent issue and are unable to reach [place], you may choose to seek medical care at your doctor's office, retail clinic, urgent care center, or emergency room.  If you have a medical emergency, please immediately call 911 or go to the emergency department.  Pager Numbers  - Dr. Nehemiah Massed: [phone]  - Dr. Laurence Ferrari: [phone]  - Dr. Nicole Kindred: [phone]  In the event of inclement weather, please call our main line at [phone] for an update on the status of any delays or closures.  Dermatology Medication Tips: Please keep the boxes that topical medications come in in order to help keep track of the instructions about where and how to use these. Pharmacies typically print the medication instructions only on the boxes and not directly on the medication tubes.   If your medication is too expensive, please contact our office at [phone] option 4 or send [place] a message through [place].   We are unable to tell what your co-pay for medications will be in advance as this is different depending on your insurance coverage. However, we may be able to find a substitute medication at lower cost or fill out paperwork to get insurance to cover a needed medication.   If  a prior authorization is required to get your medication covered by your insurance company, please allow [place] 1-2 business days to complete this process.  Drug prices often vary depending on where the prescription is filled and some pharmacies may offer cheaper prices.  The website www.goodrx.com contains coupons for medications through different pharmacies. The prices here do not account for what the cost may be with help from insurance (it may be cheaper with your insurance), but the website can  give you the price if you did not use any insurance.  - You can print the associated coupon and take it with your prescription to the pharmacy.  - You may also stop by our office during regular business hours and pick up a GoodRx coupon card.  - If you need your prescription sent electronically to a different pharmacy, notify our office through [facility] or by phone at [phone] option 4.

## 2020-07-26 NOTE — Progress Notes (Signed)
   New Patient Visit  Subjective  [name] is a 70 y.o. male who presents for the following: Skin Problem (New patient here today for cracking and peeling at hands. No fhx of psoriasis, no personal hx of eczema. Patient told in the past by other physician that he has athletes foot of the hands. ). He also has darkness on the face.  He is not on any treatment for this but would like to consider treatment. No rashes anywhere else.   The following portions of the chart were reviewed this encounter and updated as appropriate:   Tobacco  Allergies  Meds  Problems  Med Hx  Surg Hx  Fam Hx     Review of Systems:  No other skin or systemic complaints except as noted in HPI or Assessment and Plan.  Objective  Well appearing patient in no apparent distress; mood and affect are within normal limits.  A focused examination was performed including hands, arms, feet, legs. Relevant physical exam findings are noted in the Assessment and Plan.  left palm Elbows, knees and feet are clear Nails with normal appearance Left palm with peeling and hyperkeratosis     Face Reticulated hyperpigmented patches.           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  Samples given to patient x 3. Lot # [id number]   Exp:10/2021 Psoriasis is a chronic non-curable, but treatable genetic/hereditary disease that may have other systemic features affecting other organ systems such as joints (Psoriatic Arthritis). It is associated with an increased risk of inflammatory bowel disease, heart disease, non-alcoholic fatty liver disease, and depression.    Tapinarof (VTAMA) 1 % CREA - left palm Apply 1 application topically in the morning and at bedtime. To affected areas at palms  Melasma Face Recommend sunscreen and HelioCare daily  Start [facility]  Hydroquinone: 12% Kojic  Acid: 6% Niacinamide: 2% Vitamin C: 1% at bedtime for up to 3 months.   Melasma is a chronic condition of persistent pigmented patches generally on the face, worse in summer due to higher UV exposure.  Oral estrogen containing BCPs or supplements can exacerbate condition.  Recommend daily broad spectrum tinted sunscreen SPF 30+ to face, preferably with Zinc or Titanium Dioxide. Discussed Rx topical bleaching creams (i.e. hydroquinone), OTC HelioCare supplement, chemical peels (would need multiple for best result).   Return in about 2 months (around 09/26/2020).  Graciella Belton, RMA, am acting as scribe for Sarina Ser, MD .  Documentation: I have reviewed the above documentation for accuracy and completeness, and I agree with the above.  Sarina Ser, MD

## 2020-07-28 ENCOUNTER — Encounter: Payer: Self-pay | Admitting: Dermatology

## 2020-07-30 ENCOUNTER — Other Ambulatory Visit: Payer: Self-pay

## 2020-07-30 DIAGNOSIS — L409 Psoriasis, unspecified: Secondary | ICD-10-CM

## 2020-07-30 MED ORDER — VTAMA 1 % EX CREA: 1.0000 "application " | TOPICAL_CREAM | Freq: Two times a day (BID) | CUTANEOUS | 1 refills | Status: DC

## 2020-07-31 ENCOUNTER — Other Ambulatory Visit: Payer: Self-pay | Admitting: Family Medicine

## 2020-07-31 DIAGNOSIS — I82562 Chronic embolism and thrombosis of left calf muscular vein: Secondary | ICD-10-CM

## 2020-07-31 NOTE — Telephone Encounter (Signed)
Requested Prescriptions  Pending Prescriptions Disp Refills  . ELIQUIS 5 MG TABS tablet [Pharmacy Med Name: ELIQUIS 5 MG TABLET] 60 tablet 0    Sig: TAKE 1 TABLET BY MOUTH TWICE A DAY     Hematology:  Anticoagulants Failed - 07/31/2020  1:43 AM      Failed - HGB in normal range and within 360 days    Hemoglobin  Date Value Ref Range Status  06/16/2019 11.0 (L) 13.2 - 17.1 g/dL Final  04/24/2016 10.1 (L) 13.0 - 17.7 g/dL Final         Failed - PLT in normal range and within 360 days    Platelets  Date Value Ref Range Status  06/16/2019 209 140 - 400 Thousand/uL Final  06/20/2015 258 150 - 379 x10E3/uL Final         Failed - HCT in normal range and within 360 days    HCT  Date Value Ref Range Status  06/16/2019 33.5 (L) 38.5 - 50.0 % Final   Hematocrit  Date Value Ref Range Status  04/24/2016 31.1 (L) 37.5 - 51.0 % Final         Failed - Cr in normal range and within 360 days    Creat  Date Value Ref Range Status  06/16/2019 1.76 (H) 0.70 - 1.25 mg/dL Final    Comment:    For patients >59 years of age, the reference limit for Creatinine is approximately 13% higher for people identified as African-American. Renella Cunas - Valid encounter within last 12 months    Recent Outpatient Visits          7 months ago Atherosclerosis of abdominal aorta [facility])   [facility] Steele Sizer, MD   10 months ago Medicare annual wellness visit, initial   [facility] Steele Sizer, MD   1 year ago Atherosclerosis of abdominal aorta [facility])   [facility] Steele Sizer, MD   1 year ago Atherosclerosis of abdominal aorta [facility])   [facility] Steele Sizer, MD   2 years ago Gastroesophageal reflux disease without esophagitis   [facility] Steele Sizer, MD      Future Appointments            In 6 days Steele Sizer, MD [facility] [place]   In 2 months  Ralene Bathe, MD [place]

## 2020-08-03 NOTE — Progress Notes (Signed)
Name: [name]   MRN: [id number]    DOB: 1950/07/22   Date:08/06/2020       Progress Note  Subjective  Chief Complaint  Follow Up  HPI  Morbid Obese: he was up to 303 lbs May 2020 , it was down to 287 lbs and today is up again at 305 lbs.He has BMI above 35 with co-morbidities. GERD, OA, Dyslipidemia.   Dizziness  with activity: he states this Summer he went back to the gym, he states since he does not hurt on his joints he is able to work out, however he has noticed dizziness with 15-20 minutes of onse of activity , but no chest pain or sob . Explained it may be orthostaic changes but advised to see cardiologist. He just purchased an Audiological scientist and he will check vitals when he is at the gym again  Atherosclerosis of aorta: on Eliquis and atorvastatin , recheck labs today   Asthma Moderate: he is using Trelegy and singulair daily, no longer using rescue inhaler daily , using it prn a few times a week. He states symptoms are controlled, no cough or wheezing.No SOB   History of anemia: but seen by hematologist level back in Nov 2019 and Hgb  was  11.5 . Had blood transfusion after his left hip replacement surgery Dec 2020. His HCT has been stable, iron storage back to normal    CKI stage III: under the care of Dr. Holley Raring. No pruritus. 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 Anemia of chronic disease is stable.    Dyslipidemia: he is not sure if he is taking Atorvastatin, last LDL showed significant improvement down from 108 to 79 , no myalgias we will recheck labs today    Chronic DVT: on Eliquis, no side effects of medication , no easy bruising. No leg pain or swelling with ambulation.    GERD and eructation: he is doing well on Pepcid   BPH/ED: he is taking cialis daily and also flomax, still getting up multiple times with nocturia. He is taking medications, Cialis 5 mg is not working for ED, we  added 10 mg but it made him flush so we will change to 5 mg to take one daily and add extra 5 mg every 3 days prn intercourse . Reviewed directions with patient and his wife, Teddy Spike.   AR: he has been taking singulair, Allegra 180 mg daily , he is no longer taking Allegra D.  Also using eye drops to control itching and watery eyes. Unchanged    Patient Active Problem List   Diagnosis Date Noted   Osteoarthritis, multiple sites 06/16/2019   Morbid obesity ([place]) 06/16/2019   Chronic deep vein thrombosis (DVT) of calf muscle vein of left lower extremity ([place]) 11/30/2017   Atherosclerosis of abdominal aorta ([place]) 09/15/2017   Chronic deep vein thrombosis (DVT) of left popliteal vein ([place]) 06/26/2017   History of benign neoplasm of adrenal gland 02/11/2016   History of iron deficiency anemia 10/10/2015   BPH (benign prostatic hyperplasia) 06/20/2015   ED (erectile dysfunction) 06/20/2015   Allergic rhinitis, seasonal 06/20/2015   Anemia of chronic disease 06/20/2015   Hypogonadism in male 06/20/2015   Asthma, well controlled, moderate persistent 06/20/2015   Hypertension, benign 06/20/2015   Hyperglycemia 06/20/2015   History of shingles 06/20/2015   GERD without esophagitis 06/20/2015   Chronic radicular low back pain 06/20/2015   History  of epilepsy 06/20/2015   Migraine without aura and without status migrainosus, not intractable 06/20/2015   Dyslipidemia 06/20/2015   Primary osteoarthritis of both knees 06/20/2015    Past Surgical History:  Procedure Laterality Date   ADRENALECTOMY Left 02/11/2016   [facility]   COLONOSCOPY  02/2012   normal   JOINT REPLACEMENT     KNEE ARTHROSCOPY Left 10/06/2009   SINUS EXPLORATION     TOTAL HIP ARTHROPLASTY Left 12/14/2017   Procedure: TOTAL HIP ARTHROPLASTY ANTERIOR APPROACH;  Surgeon: Dorna Leitz, MD;  Location: [facility];  Service: Orthopedics;  Laterality: Left;   TOTAL KNEE ARTHROPLASTY Right 06/20/2016   TOTAL KNEE ARTHROPLASTY Right 06/20/2016    Procedure: TOTAL KNEE ARTHROPLASTY;  Surgeon: Dorna Leitz, MD;  Location: [facility];  Service: Orthopedics;  Laterality: Right;   TOTAL KNEE ARTHROPLASTY Left 12/19/2016   Procedure: LEFT TOTAL KNEE ARTHROPLASTY;  Surgeon: Dorna Leitz, MD;  Location: [facility];  Service: Orthopedics;  Laterality: Left;  Adductor Block    Family History  Problem Relation Age of Onset   Diabetes Mother    Heart disease Mother    Lung disease Mother    Seizures Maternal Grandmother    Alzheimer's disease Brother     Social History   Tobacco Use   Smoking status: Former    Packs/day: 1.00    Years: 10.00    Pack years: 10.00    Types: Cigarettes    Quit date: 1980    Years since quitting: 42.6   Smokeless tobacco: Never   Tobacco comments:    39 years ago 71 when he stopped  Substance Use Topics   Alcohol use: No    Alcohol/week: 0.0 standard drinks     Current Outpatient Medications:    albuterol (VENTOLIN HFA) 108 (90 Base) MCG/ACT inhaler, Inhale 2 puffs into the lungs every 4 (four) hours as needed for wheezing or shortness of breath., Disp: 8.5 g, Rfl: 0   atorvastatin (LIPITOR) 20 MG tablet, TAKE 1 TABLET BY MOUTH EVERY DAY, Disp: 90 tablet, Rfl: 0   azelastine (OPTIVAR) 0.05 % ophthalmic solution, PLACE 2 DROPS INTO BOTH EYES 2 (TWO) TIMES DAILY., Disp: 18 mL, Rfl: 1   ELIQUIS 5 MG TABS tablet, TAKE 1 TABLET BY MOUTH TWICE A DAY, Disp: 60 tablet, Rfl: 0   famotidine (PEPCID) 20 MG tablet, TAKE 1 TABLET (20 MG TOTAL) BY MOUTH 2 (TWO) TIMES DAILY AS NEEDED FOR HEARTBURN OR INDIGESTION., Disp: 60 tablet, Rfl: 0   fexofenadine-pseudoephedrine (ALLEGRA-D 24) 180-240 MG 24 hr tablet, Take 1 tablet by mouth daily as needed (for allergies.)., Disp: 30 tablet, Rfl: 2   fluticasone (FLONASE) 50 MCG/ACT nasal spray, SPRAY 2 SPRAYS INTO EACH NOSTRIL EVERY DAY, Disp: 48 mL, Rfl: 1   Fluticasone-Umeclidin-Vilant (TRELEGY ELLIPTA) 100-62.5-25 MCG/INH AEPB, Inhale 1 puff into the lungs daily., Disp: 60 each,  Rfl: 5   montelukast (SINGULAIR) 10 MG tablet, Take 1 tablet (10 mg total) by mouth daily., Disp: 30 tablet, Rfl: 5   Multiple Vitamins-Minerals (MULTIVITAMIN ADULTS 50+ PO), Take 1 tablet by mouth daily. NATURE'S CODE MEN OVER 50 MULTIVITAMIN PACK, Disp: , Rfl:    Polyethyl Glycol-Propyl Glycol 0.4-0.3 % SOLN, Place 1-2 drops into both eyes 3 (three) times daily as needed (for dry/irritated eyes.)., Disp: , Rfl:    tadalafil (CIALIS) 5 MG tablet, TAKE 1 TABLET BY MOUTH EVERY DAY, Disp: 30 tablet, Rfl: 5   tamsulosin (FLOMAX) 0.4 MG CAPS capsule, Take 1 capsule (0.4 mg total) by mouth  every evening., Disp: 30 capsule, Rfl: 5   Tapinarof (VTAMA) 1 % CREA, Apply 1 application topically in the morning and at bedtime. To affected areas at palms, Disp: 60 g, Rfl: 1   tiZANidine (ZANAFLEX) 2 MG tablet, TAKE 1 TABLET BY MOUTH THREE TIMES A DAY, Disp: 90 tablet, Rfl: 0   vitamin B-12 (CYANOCOBALAMIN) 500 MCG tablet, Take 500 mcg by mouth daily., Disp: , Rfl:   Allergies  Allergen Reactions   Almond (Diagnostic) Other (See Comments)    Migraines   Lactose Intolerance (Gi) Other (See Comments)    MIGRAINES   Peanut-Containing Drug Products Other (See Comments)    Migraines   Shellfish Allergy Other (See Comments)    Congestion/breathing problems/migraines.    I personally reviewed active problem list, medication list, allergies, family history, social history, health maintenance with the patient/caregiver today.   ROS  Constitutional: Negative for fever or weight change.  Respiratory: Negative for cough and shortness of breath.   Cardiovascular: Negative for chest pain or palpitations.  Gastrointestinal: Negative for abdominal pain, no bowel changes.  Musculoskeletal: Negative for gait problem or joint swelling.  Skin: Negative for rash.  Neurological: positive  for dizziness but no  headache.  No other specific complaints in a complete review of systems (except as listed in HPI above).    Objective  Vitals:   08/06/20 1502  BP: 120/72  Pulse: 99  Resp: 16  Temp: 98 F (36.7 C)  SpO2: 97%  Weight: (!) 305 lb (138.3 kg)  Height: '6\' 2"'$  (1.88 m)    Body mass index is 39.16 kg/m.  Physical Exam  Constitutional: Patient appears well-developed and well-nourished. Obese  No distress.  HEENT: head atraumatic, normocephalic, pupils equal and reactive to light, neck supple Cardiovascular: Normal rate, regular rhythm and normal heart sounds.  No murmur heard. No BLE edema. Pulmonary/Chest: Effort normal and breath sounds normal. No respiratory distress. Abdominal: Soft.  There is no tenderness. Psychiatric: Patient has a normal mood and affect. behavior is normal. Judgment and thought content normal.   PHQ2/9: Depression screen [facility] 2/9 08/06/2020 12/16/2019 09/06/2019 06/16/2019 12/15/2018  Decreased Interest 0 0 0 0 0  Down, Depressed, Hopeless 0 0 0 0 0  PHQ - 2 Score 0 0 0 0 0  Altered sleeping - - - 0 0  Tired, decreased energy - - - 0 0  Change in appetite - - - 0 0  Feeling bad or failure about yourself  - - - 0 0  Trouble concentrating - - - 0 0  Moving slowly or fidgety/restless - - - 0 0  Suicidal thoughts - - - 0 0  PHQ-9 Score - - - 0 0  Difficult doing work/chores - - - - -    phq 9 is negative   Fall Risk: Fall Risk  08/06/2020 12/16/2019 09/06/2019 06/16/2019 12/15/2018  Falls in the past year? 0 0 0 0 0  Number falls in past yr: 0 0 0 0 0  Injury with Fall? 0 0 0 0 0      Functional Status Survey: Is the patient deaf or have difficulty hearing?: No Does the patient have difficulty seeing, even when wearing glasses/contacts?: No Does the patient have difficulty concentrating, remembering, or making decisions?: No Does the patient have difficulty walking or climbing stairs?: No Does the patient have difficulty dressing or bathing?: No Does the patient have difficulty doing errands alone such as visiting a doctor's office or shopping?:  No  Assessment & Plan  1. Atherosclerosis of abdominal aorta ([place])  - Ambulatory referral to Cardiology  2. Lightheadedness  - Ambulatory referral to Cardiology  3. Chronic deep vein thrombosis (DVT) of calf muscle vein of left lower extremity (HCC)  - apixaban (ELIQUIS) 5 MG TABS tablet; Take 1 tablet (5 mg total) by mouth 2 (two) times daily.  Dispense: 60 tablet; Refill: 5  4. Asthma, well controlled, moderate persistent  - Fluticasone-Umeclidin-Vilant (TRELEGY ELLIPTA) 100-62.5-25 MCG/INH AEPB; Inhale 1 puff into the lungs daily.  Dispense: 60 each; Refill: 5 - montelukast (SINGULAIR) 10 MG tablet; Take 1 tablet (10 mg total) by mouth daily.  Dispense: 90 tablet; Refill: 1  5 Benign prostatic hyperplasia with urinary obstruction  - PSA - tamsulosin (FLOMAX) 0.4 MG CAPS capsule; Take 1 capsule (0.4 mg total) by mouth every evening.  Dispense: 90 capsule; Refill: 1  6. Gastroesophageal reflux disease without esophagitis  - famotidine (PEPCID) 20 MG tablet; Take 1 tablet (20 mg total) by mouth 2 (two) times daily.  Dispense: 180 tablet; Refill: 1  7. Perennial allergic rhinitis with seasonal variation   8. Dyslipidemia  - Lipid panel  9. Anemia of chronic disease   10. Primary osteoarthritis involving multiple joints   11. Benign hypertension with chronic kidney disease, stage III (HCC)  - COMPLETE METABOLIC PANEL WITH GFR - CBC with Differential/Platelet  12. Secondary hyperparathyroidism ([place])  - Parathyroid hormone, intact (no Ca)  13. 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 Need for shingles vaccine  - Zoster Vaccine Adjuvanted [facility]) injection; Inject 0.5 mLs into the muscle once for 1 dose.  Dispense: 0.5 mL; Refill: 1  15. Intertrigo  Discussed anti-fungal powder   16. Chronic kidney disease, stage 3a (HCC)  - COMPLETE METABOLIC PANEL WITH GFR - Parathyroid hormone, intact (no Ca) - CBC with Differential/Platelet  17. Seasonal allergic  rhinitis due to pollen  - fluticasone (FLONASE) 50 MCG/ACT nasal spray; Place 2 sprays into both nostrils daily.  Dispense: 48 mL; Refill: 1  18. Intermittent low back pain  - tiZANidine (ZANAFLEX) 2 MG tablet; Take 1 tablet (2 mg total) by mouth 2 (two) times daily.  Dispense: 180 tablet; Refill: 1  19. Erectile dysfunction, unspecified erectile dysfunction type  - tadalafil (CIALIS) 5 MG tablet; Take 1 tablet (5 mg total) by mouth daily.  Dispense: 30 tablet; Refill: 5

## 2020-08-05 ENCOUNTER — Other Ambulatory Visit: Payer: Self-pay | Admitting: Family Medicine

## 2020-08-05 DIAGNOSIS — K219 Gastro-esophageal reflux disease without esophagitis: Secondary | ICD-10-CM

## 2020-08-05 NOTE — Telephone Encounter (Signed)
Requested Prescriptions  Pending Prescriptions Disp Refills  . famotidine (PEPCID) 20 MG tablet [Pharmacy Med Name: FAMOTIDINE 20 MG TABLET] 60 tablet 0    Sig: TAKE 1 TABLET (20 MG TOTAL) BY MOUTH 2 (TWO) TIMES DAILY AS NEEDED FOR HEARTBURN OR INDIGESTION.     Gastroenterology:  H2 Antagonists Passed - 08/05/2020  2:06 PM      Passed - Valid encounter within last 12 months    Recent Outpatient Visits          7 months ago Atherosclerosis of abdominal aorta [facility])   [facility] Steele Sizer, MD   11 months ago Medicare annual wellness visit, initial   [facility] Steele Sizer, MD   1 year ago Atherosclerosis of abdominal aorta [facility])   [facility] Steele Sizer, MD   1 year ago Atherosclerosis of abdominal aorta [facility])   [facility] Steele Sizer, MD   2 years ago Gastroesophageal reflux disease without esophagitis   [facility] Steele Sizer, MD      Future Appointments            Tomorrow Steele Sizer, MD [facility] [place]   In 2 months Ralene Bathe, MD [place]

## 2020-08-06 ENCOUNTER — Other Ambulatory Visit: Payer: Self-pay

## 2020-08-06 ENCOUNTER — Ambulatory Visit (INDEPENDENT_AMBULATORY_CARE_PROVIDER_SITE_OTHER): Payer: Medicare Other | Admitting: Family Medicine

## 2020-08-06 ENCOUNTER — Encounter: Payer: Self-pay | Admitting: Family Medicine

## 2020-08-06 VITALS — BP 120/72 | HR 99 | Temp 98.0°F | Resp 16 | Ht 74.0 in | Wt 305.0 lb

## 2020-08-06 DIAGNOSIS — L304 Erythema intertrigo: Secondary | ICD-10-CM

## 2020-08-06 DIAGNOSIS — M545 Low back pain, unspecified: Secondary | ICD-10-CM

## 2020-08-06 DIAGNOSIS — E896 Postprocedural adrenocortical (-medullary) hypofunction: Secondary | ICD-10-CM

## 2020-08-06 DIAGNOSIS — I129 Hypertensive chronic kidney disease with stage 1 through stage 4 chronic kidney disease, or unspecified chronic kidney disease: Secondary | ICD-10-CM | POA: Diagnosis not present

## 2020-08-06 DIAGNOSIS — N138 Other obstructive and reflux uropathy: Secondary | ICD-10-CM | POA: Diagnosis not present

## 2020-08-06 DIAGNOSIS — J3089 Other allergic rhinitis: Secondary | ICD-10-CM

## 2020-08-06 DIAGNOSIS — Z23 Encounter for immunization: Secondary | ICD-10-CM

## 2020-08-06 DIAGNOSIS — I82562 Chronic embolism and thrombosis of left calf muscular vein: Secondary | ICD-10-CM

## 2020-08-06 DIAGNOSIS — J4540 Moderate persistent asthma, uncomplicated: Secondary | ICD-10-CM

## 2020-08-06 DIAGNOSIS — I700 Atherosclerosis of aorta: Secondary | ICD-10-CM | POA: Diagnosis not present

## 2020-08-06 DIAGNOSIS — M8949 Other hypertrophic osteoarthropathy, multiple sites: Secondary | ICD-10-CM | POA: Diagnosis not present

## 2020-08-06 DIAGNOSIS — N529 Male erectile dysfunction, unspecified: Secondary | ICD-10-CM

## 2020-08-06 DIAGNOSIS — N1831 Chronic kidney disease, stage 3a: Secondary | ICD-10-CM | POA: Diagnosis not present

## 2020-08-06 DIAGNOSIS — M150 Primary generalized (osteo)arthritis: Secondary | ICD-10-CM

## 2020-08-06 DIAGNOSIS — N401 Enlarged prostate with lower urinary tract symptoms: Secondary | ICD-10-CM | POA: Diagnosis not present

## 2020-08-06 DIAGNOSIS — K219 Gastro-esophageal reflux disease without esophagitis: Secondary | ICD-10-CM

## 2020-08-06 DIAGNOSIS — J301 Allergic rhinitis due to pollen: Secondary | ICD-10-CM

## 2020-08-06 DIAGNOSIS — J302 Other seasonal allergic rhinitis: Secondary | ICD-10-CM

## 2020-08-06 DIAGNOSIS — R42 Dizziness and giddiness: Secondary | ICD-10-CM

## 2020-08-06 DIAGNOSIS — D638 Anemia in other chronic diseases classified elsewhere: Secondary | ICD-10-CM

## 2020-08-06 DIAGNOSIS — N183 Chronic kidney disease, stage 3 unspecified: Secondary | ICD-10-CM

## 2020-08-06 DIAGNOSIS — N2581 Secondary hyperparathyroidism of renal origin: Secondary | ICD-10-CM | POA: Diagnosis not present

## 2020-08-06 DIAGNOSIS — E785 Hyperlipidemia, unspecified: Secondary | ICD-10-CM

## 2020-08-06 DIAGNOSIS — M159 Polyosteoarthritis, unspecified: Secondary | ICD-10-CM

## 2020-08-06 MED ORDER — APIXABAN 5 MG PO TABS: 5.0000 mg | ORAL_TABLET | Freq: Two times a day (BID) | ORAL | 5 refills | Status: DC

## 2020-08-06 MED ORDER — TAMSULOSIN HCL 0.4 MG PO CAPS: 0.4000 mg | ORAL_CAPSULE | Freq: Every evening | ORAL | 1 refills | Status: DC

## 2020-08-06 MED ORDER — TADALAFIL 5 MG PO TABS: 5.0000 mg | ORAL_TABLET | Freq: Every day | ORAL | 5 refills | Status: DC

## 2020-08-06 MED ORDER — ALBUTEROL SULFATE HFA 108 (90 BASE) MCG/ACT IN AERS: 2.0000 | INHALATION_SPRAY | RESPIRATORY_TRACT | 0 refills | Status: DC | PRN

## 2020-08-06 MED ORDER — FAMOTIDINE 20 MG PO TABS: 20.0000 mg | ORAL_TABLET | Freq: Two times a day (BID) | ORAL | 1 refills | Status: DC

## 2020-08-06 MED ORDER — MONTELUKAST SODIUM 10 MG PO TABS: 10.0000 mg | ORAL_TABLET | Freq: Every day | ORAL | 1 refills | Status: DC

## 2020-08-06 MED ORDER — TRELEGY ELLIPTA 100-62.5-25 MCG/INH IN AEPB: 1.0000 | INHALATION_SPRAY | Freq: Every day | RESPIRATORY_TRACT | 5 refills | Status: DC

## 2020-08-06 MED ORDER — FLUTICASONE PROPIONATE 50 MCG/ACT NA SUSP: 2.0000 | Freq: Every day | NASAL | 1 refills | Status: DC

## 2020-08-06 MED ORDER — SHINGRIX 50 MCG/0.5ML IM SUSR: 0.5000 mL | Freq: Once | INTRAMUSCULAR | 1 refills | Status: AC

## 2020-08-06 MED ORDER — TIZANIDINE HCL 2 MG PO TABS: 2.0000 mg | ORAL_TABLET | Freq: Two times a day (BID) | ORAL | 1 refills | Status: DC

## 2020-08-07 ENCOUNTER — Other Ambulatory Visit: Payer: Self-pay | Admitting: Family Medicine

## 2020-08-07 DIAGNOSIS — I700 Atherosclerosis of aorta: Secondary | ICD-10-CM

## 2020-08-07 LAB — PARATHYROID HORMONE, INTACT (NO CA): PTH: 43 pg/mL (ref 16–77)

## 2020-08-07 LAB — COMPLETE METABOLIC PANEL WITH GFR
AG Ratio: 1.4 (calc) (ref 1.0–2.5)
ALT: 21 U/L (ref 9–46)
AST: 27 U/L (ref 10–35)
Albumin: 4.4 g/dL (ref 3.6–5.1)
Alkaline phosphatase (APISO): 78 U/L (ref 35–144)
BUN/Creatinine Ratio: 20 (calc) (ref 6–22)
BUN: 32 mg/dL — ABNORMAL HIGH (ref 7–25)
CO2: 28 mmol/L (ref 20–32)
Calcium: 9.6 mg/dL (ref 8.6–10.3)
Chloride: 100 mmol/L (ref 98–110)
Creat: 1.64 mg/dL — ABNORMAL HIGH (ref 0.70–1.35)
Globulin: 3.1 g/dL (calc) (ref 1.9–3.7)
Glucose, Bld: 95 mg/dL (ref 65–99)
Potassium: 4.4 mmol/L (ref 3.5–5.3)
Sodium: 138 mmol/L (ref 135–146)
Total Bilirubin: 0.4 mg/dL (ref 0.2–1.2)
Total Protein: 7.5 g/dL (ref 6.1–8.1)
eGFR: 45 mL/min/{1.73_m2} — ABNORMAL LOW (ref >=60)

## 2020-08-07 LAB — CBC WITH DIFFERENTIAL/PLATELET
Absolute Monocytes: 773 cells/uL (ref 200–950)
Basophils Absolute: 71 cells/uL (ref 0–200)
Basophils Relative: 1.2 %
Eosinophils Absolute: 171 cells/uL (ref 15–500)
Eosinophils Relative: 2.9 %
HCT: 35.6 % — ABNORMAL LOW (ref 38.5–50.0)
Hemoglobin: 11.9 g/dL — ABNORMAL LOW (ref 13.2–17.1)
Lymphs Abs: 2248 cells/uL (ref 850–3900)
MCH: 28.3 pg (ref 27.0–33.0)
MCHC: 33.4 g/dL (ref 32.0–36.0)
MCV: 84.8 fL (ref 80.0–100.0)
MPV: 10.9 fL (ref 7.5–12.5)
Monocytes Relative: 13.1 %
Neutro Abs: 2637 cells/uL (ref 1500–7800)
Neutrophils Relative %: 44.7 %
Platelets: 212 10*3/uL (ref 140–400)
RBC: 4.2 10*6/uL (ref 4.20–5.80)
RDW: 13.4 % (ref 11.0–15.0)
Total Lymphocyte: 38.1 %
WBC: 5.9 10*3/uL (ref 3.8–10.8)

## 2020-08-07 LAB — LIPID PANEL
Cholesterol: 164 mg/dL (ref <200)
HDL: 42 mg/dL (ref >=40)
LDL Cholesterol (Calc): 100 mg/dL (calc) — ABNORMAL HIGH
Non-HDL Cholesterol (Calc): 122 mg/dL (calc) (ref <130)
Total CHOL/HDL Ratio: 3.9 (calc) (ref <5.0)
Triglycerides: 128 mg/dL (ref <150)

## 2020-08-07 LAB — PSA: PSA: 2.26 ng/mL (ref <=4.00)

## 2020-08-07 MED ORDER — ATORVASTATIN CALCIUM 40 MG PO TABS: 40.0000 mg | ORAL_TABLET | Freq: Every day | ORAL | 1 refills | Status: DC

## 2020-08-08 DIAGNOSIS — Z20822 Contact with and (suspected) exposure to covid-19: Secondary | ICD-10-CM | POA: Diagnosis not present

## 2020-08-28 ENCOUNTER — Encounter: Payer: Self-pay | Admitting: Oncology

## 2020-08-28 ENCOUNTER — Other Ambulatory Visit: Payer: Self-pay

## 2020-08-28 ENCOUNTER — Encounter: Payer: Self-pay | Admitting: Family Medicine

## 2020-08-28 ENCOUNTER — Ambulatory Visit
Admission: RE | Admit: 2020-08-28 | Discharge: 2020-08-28 | Disposition: A | Discharge status: Home / Self Care | Payer: Medicare Other | Attending: Family Medicine | Admitting: Family Medicine

## 2020-08-28 ENCOUNTER — Ambulatory Visit
Admission: RE | Admit: 2020-08-28 | Discharge: 2020-08-28 | Disposition: A | Discharge status: Home / Self Care | Payer: Medicare Other | Source: Ambulatory Visit | Attending: Family Medicine | Admitting: Family Medicine

## 2020-08-28 ENCOUNTER — Ambulatory Visit (INDEPENDENT_AMBULATORY_CARE_PROVIDER_SITE_OTHER): Payer: Medicare Other | Admitting: Family Medicine

## 2020-08-28 VITALS — BP 136/80 | HR 91 | Temp 98.5°F | Resp 16 | Ht 74.0 in | Wt 298.0 lb

## 2020-08-28 DIAGNOSIS — J069 Acute upper respiratory infection, unspecified: Secondary | ICD-10-CM | POA: Insufficient documentation

## 2020-08-28 DIAGNOSIS — J45901 Unspecified asthma with (acute) exacerbation: Secondary | ICD-10-CM

## 2020-08-28 DIAGNOSIS — R059 Cough, unspecified: Secondary | ICD-10-CM | POA: Diagnosis not present

## 2020-08-28 MED ORDER — BENZONATATE 100 MG PO CAPS: 100.0000 mg | ORAL_CAPSULE | Freq: Two times a day (BID) | ORAL | 0 refills | Status: DC | PRN

## 2020-08-28 NOTE — Progress Notes (Signed)
Name: [name]   MRN: [id number]    DOB: Jan 02, 1951   Date:08/28/2020       Progress Note  Subjective  Chief Complaint  Cough  HPI  Patient states he developed a sore throat on Saturday followed by fatigue, body aches, dry cough, SOB and occasional wheezing. He had a home COVID-19 test that was negative on Sunday but he is still not feeling well. He has been having chills. Appetite is normal, denies lack of sense of taste or smell. 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[place]) 08/06/2020   Osteoarthritis, multiple sites 06/16/2019   Morbid obesity ([place]) 06/16/2019   Chronic deep vein thrombosis (DVT) of calf muscle vein of left lower extremity ([place]) 11/30/2017   Atherosclerosis of abdominal aorta ([place]) 09/15/2017   Chronic deep vein thrombosis (DVT) of left popliteal vein ([place]) 06/26/2017   History of benign neoplasm of adrenal gland 02/11/2016   History of iron deficiency anemia 10/10/2015   BPH (benign prostatic hyperplasia) 06/20/2015   ED (erectile dysfunction) 06/20/2015   Allergic rhinitis, seasonal 06/20/2015   Anemia of chronic disease 06/20/2015   Hypogonadism in male 06/20/2015   Asthma, well controlled, moderate persistent 06/20/2015   Hypertension, benign 06/20/2015   Hyperglycemia 06/20/2015   History of shingles 06/20/2015   GERD without esophagitis 06/20/2015   Chronic radicular low back pain 06/20/2015   History of epilepsy 06/20/2015   Migraine without aura and without status migrainosus, not intractable 06/20/2015   Dyslipidemia 06/20/2015   Primary osteoarthritis of both knees 06/20/2015    Past Surgical History:  Procedure Laterality Date   ADRENALECTOMY Left 02/11/2016   [facility]   COLONOSCOPY  02/2012   normal   JOINT REPLACEMENT     KNEE ARTHROSCOPY Left 10/06/2009   SINUS EXPLORATION     TOTAL HIP ARTHROPLASTY  Left 12/14/2017   Procedure: TOTAL HIP ARTHROPLASTY ANTERIOR APPROACH;  Surgeon: Dorna Leitz, MD;  Location: [facility];  Service: Orthopedics;  Laterality: Left;   TOTAL KNEE ARTHROPLASTY Right 06/20/2016   TOTAL KNEE ARTHROPLASTY Right 06/20/2016   Procedure: TOTAL KNEE ARTHROPLASTY;  Surgeon: Dorna Leitz, MD;  Location: [facility];  Service: Orthopedics;  Laterality: Right;   TOTAL KNEE ARTHROPLASTY Left 12/19/2016   Procedure: LEFT TOTAL KNEE ARTHROPLASTY;  Surgeon: Dorna Leitz, MD;  Location[facility];  Service: Orthopedics;  Laterality: Left;  Adductor Block    Family History  Problem Relation Age of Onset   Diabetes Mother    Heart disease Mother    Lung disease Mother    Seizures Maternal Grandmother    Alzheimer's disease Brother     Social History   Tobacco Use   Smoking status: Former    Packs/day: 1.00    Years: 10.00    Pack years: 10.00    Types: Cigarettes    Quit date: 1980    Years since quitting: 42.6   Smokeless tobacco: Never   Tobacco comments:    38 years ago 2 when he stopped  Substance Use Topics   Alcohol use: No    Alcohol/week: 0.0 standard drinks     Current Outpatient Medications:    albuterol (VENTOLIN HFA) 108 (90 Base) MCG/ACT inhaler, Inhale 2 puffs into the lungs every 4 (four) hours as needed for wheezing or shortness of breath., Disp: 8.5 g, Rfl: 0   apixaban (ELIQUIS) 5 MG TABS  tablet, Take 1 tablet (5 mg total) by mouth 2 (two) times daily., Disp: 60 tablet, Rfl: 5   atorvastatin (LIPITOR) 40 MG tablet, Take 1 tablet (40 mg total) by mouth daily., Disp: 90 tablet, Rfl: 1   azelastine (OPTIVAR) 0.05 % ophthalmic solution, PLACE 2 DROPS INTO BOTH EYES 2 (TWO) TIMES DAILY., Disp: 18 mL, Rfl: 1   famotidine (PEPCID) 20 MG tablet, Take 1 tablet (20 mg total) by mouth 2 (two) times daily., Disp: 180 tablet, Rfl: 1   fluticasone (FLONASE) 50 MCG/ACT nasal spray, Place 2 sprays into both nostrils daily., Disp: 48 mL, Rfl: 1    Fluticasone-Umeclidin-Vilant (TRELEGY ELLIPTA) 100-62.5-25 MCG/INH AEPB, Inhale 1 puff into the lungs daily., Disp: 60 each, Rfl: 5   montelukast (SINGULAIR) 10 MG tablet, Take 1 tablet (10 mg total) by mouth daily., Disp: 90 tablet, Rfl: 1   Multiple Vitamins-Minerals (MULTIVITAMIN ADULTS 50+ PO), Take 1 tablet by mouth daily. NATURE'S CODE MEN OVER 50 MULTIVITAMIN PACK, Disp: , Rfl:    Polyethyl Glycol-Propyl Glycol 0.4-0.3 % SOLN, Place 1-2 drops into both eyes 3 (three) times daily as needed (for dry/irritated eyes.)., Disp: , Rfl:    tadalafil (CIALIS) 5 MG tablet, Take 1 tablet (5 mg total) by mouth daily., Disp: 30 tablet, Rfl: 5   tamsulosin (FLOMAX) 0.4 MG CAPS capsule, Take 1 capsule (0.4 mg total) by mouth every evening., Disp: 90 capsule, Rfl: 1   Tapinarof (VTAMA) 1 % CREA, Apply 1 application topically in the morning and at bedtime. To affected areas at palms, Disp: 60 g, Rfl: 1   tiZANidine (ZANAFLEX) 2 MG tablet, Take 1 tablet (2 mg total) by mouth 2 (two) times daily., Disp: 180 tablet, Rfl: 1   VELTASSA 8.4 g packet, Take 1 packet by mouth daily., Disp: , Rfl:    vitamin B-12 (CYANOCOBALAMIN) 500 MCG tablet, Take 500 mcg by mouth daily., Disp: , Rfl:   Allergies  Allergen Reactions   Almond (Diagnostic) Other (See Comments)    Migraines   Lactose Intolerance (Gi) Other (See Comments)    MIGRAINES   Peanut-Containing Drug Products Other (See Comments)    Migraines   Shellfish Allergy Other (See Comments)    Congestion/breathing problems/migraines.    I personally reviewed active problem list, medication list, allergies, family history, social history with the patient/caregiver today.   ROS  Ten systems reviewed and is negative except as mentioned in HPI   Objective  Vitals:   08/28/20 1131  BP: 136/80  Pulse: 91  Resp: 16  Temp: 98.5 F (36.9 C)  SpO2: 98%  Weight: 298 lb (135.2 kg)  Height: 6' 2"  (1.88 m)    Body mass index is 38.26 kg/m.  Physical  Exam Constitutional: Patient appears well-developed and well-nourished. Obese  No distress.  HEENT: head atraumatic, normocephalic, pupils equal and reactive to light, neck supple Cardiovascular: Normal rate, regular rhythm and normal heart sounds.  No murmur heard. No BLE edema. Pulmonary/Chest: Effort normal and breath sounds normal. No respiratory distress. Abdominal: Soft.  There is no tenderness. Psychiatric: Patient has a normal mood and affect. behavior is normal. Judgment and thought content normal.   Recent Results (from the past 2160 hour(s))  COMPLETE METABOLIC PANEL WITH GFR     Status: Abnormal   Collection Time: 08/06/20  4:10 PM  Result Value Ref Range   Glucose, Bld 95 65 - 99 mg/dL    Comment: .            Fasting  reference interval .    BUN 32 (H) 7 - 25 mg/dL   Creat 1.64 (H) 0.70 - 1.35 mg/dL   eGFR 45 (L) > OR = 60 mL/min/1.53m    Comment: The eGFR is based on the CKD-EPI 2021 equation. To calculate  the new eGFR from a previous Creatinine or Cystatin C result, go to https://www.kidney.org/professionals/ kdoqi/gfr%5Fcalculator    BUN/Creatinine Ratio 20 6 - 22 (calc)   Sodium 138 135 - 146 mmol/L   Potassium 4.4 3.5 - 5.3 mmol/L   Chloride 100 98 - 110 mmol/L   CO2 28 20 - 32 mmol/L   Calcium 9.6 8.6 - 10.3 mg/dL   Total Protein 7.5 6.1 - 8.1 g/dL   Albumin 4.4 3.6 - 5.1 g/dL   Globulin 3.1 1.9 - 3.7 g/dL (calc)   AG Ratio 1.4 1.0 - 2.5 (calc)   Total Bilirubin 0.4 0.2 - 1.2 mg/dL   Alkaline phosphatase (APISO) 78 35 - 144 U/L   AST 27 10 - 35 U/L   ALT 21 9 - 46 U/L  Lipid panel     Status: Abnormal   Collection Time: 08/06/20  4:10 PM  Result Value Ref Range   Cholesterol 164 <200 mg/dL   HDL 42 > OR = 40 mg/dL   Triglycerides 128 <150 mg/dL   LDL Cholesterol (Calc) 100 (H) mg/dL (calc)    Comment: Reference range: <100 . Desirable range <100 mg/dL for primary prevention;   <70 mg/dL for patients with CHD or diabetic patients  with > or = 2  CHD risk factors. .Marland KitchenLDL-C is now calculated using the Martin-Hopkins  calculation, which is a validated novel method providing  better accuracy than the Friedewald equation in the  estimation of LDL-C.  MCresenciano Genreet al. JAnnamaria Helling 28119;147(82: 2061-2068  (http://education.[employer].com/faq/FAQ164)    Total CHOL/HDL Ratio 3.9 <5.0 (calc)   Non-HDL Cholesterol (Calc) 122 <130 mg/dL (calc)    Comment: For patients with diabetes plus 1 major ASCVD risk  factor, treating to a non-HDL-C goal of <100 mg/dL  (LDL-C of <70 mg/dL) is considered a therapeutic  option.   PSA     Status: None   Collection Time: 08/06/20  4:10 PM  Result Value Ref Range   PSA 2.26 < OR = 4.00 ng/mL    Comment: The total PSA value from this assay system is  standardized against the WHO standard. The test  result will be approximately 20% lower when compared  to the equimolar-standardized total PSA (Beckman  Coulter). Comparison of serial PSA results should be  interpreted with this fact in mind. . This test was performed using the Siemens  chemiluminescent method. Values obtained from  different assay methods cannot be used interchangeably. PSA levels, regardless of value, should not be interpreted as absolute evidence of the presence or absence of disease.   Parathyroid hormone, intact (no Ca)     Status: None   Collection Time: 08/06/20  4:10 PM  Result Value Ref Range   PTH 43 16 - 77 pg/mL    Comment: . Interpretive Guide    Intact PTH           Calcium ------------------    ----------           ------- Normal Parathyroid    Normal               Normal Hypoparathyroidism    Low or Low Normal    Low Hyperparathyroidism    Primary  Normal or High       High    Secondary          High                 Normal or Low    Tertiary           High                 High Non-Parathyroid    Hypercalcemia      Low or Low Normal    High .   CBC with Differential/Platelet     Status: Abnormal    Collection Time: 08/06/20  4:10 PM  Result Value Ref Range   WBC 5.9 3.8 - 10.8 Thousand/uL   RBC 4.20 4.20 - 5.80 Million/uL   Hemoglobin 11.9 (L) 13.2 - 17.1 g/dL   HCT 35.6 (L) 38.5 - 50.0 %   MCV 84.8 80.0 - 100.0 fL   MCH 28.3 27.0 - 33.0 pg   MCHC 33.4 32.0 - 36.0 g/dL   RDW 13.4 11.0 - 15.0 %   Platelets 212 140 - 400 Thousand/uL   MPV 10.9 7.5 - 12.5 fL   Neutro Abs 2,637 1,500 - 7,800 cells/uL   Lymphs Abs 2,248 850 - 3,900 cells/uL   Absolute Monocytes 773 200 - 950 cells/uL   Eosinophils Absolute 171 15 - 500 cells/uL   Basophils Absolute 71 0 - 200 cells/uL   Neutrophils Relative % 44.7 %   Total Lymphocyte 38.1 %   Monocytes Relative 13.1 %   Eosinophils Relative 2.9 %   Basophils Relative 1.2 %    PHQ2/9: Depression screen [facility] 2/9 08/28/2020 08/06/2020 12/16/2019 09/06/2019 06/16/2019  Decreased Interest 0 0 0 0 0  Down, Depressed, Hopeless 0 0 0 0 0  PHQ - 2 Score 0 0 0 0 0  Altered sleeping - - - - 0  Tired, decreased energy - - - - 0  Change in appetite - - - - 0  Feeling bad or failure about yourself  - - - - 0  Trouble concentrating - - - - 0  Moving slowly or fidgety/restless - - - - 0  Suicidal thoughts - - - - 0  PHQ-9 Score - - - - 0  Difficult doing work/chores - - - - -    phq 9 is negative   Fall Risk: Fall Risk  08/28/2020 08/06/2020 12/16/2019 09/06/2019 06/16/2019  Falls in the past year? 0 0 0 0 0  Number falls in past yr: 0 0 0 0 0  Injury with Fall? 0 0 0 0 0  Risk for fall due to : No Fall Risks - - - -  Follow up Falls prevention discussed - - - -     Functional Status Survey: Is the patient deaf or have difficulty hearing?: No Does the patient have difficulty seeing, even when wearing glasses/contacts?: No Does the patient have difficulty concentrating, remembering, or making decisions?: No Does the patient have difficulty walking or climbing stairs?: No Does the patient have difficulty dressing or bathing?: No Does the patient have  difficulty doing errands alone such as visiting a doctor's office or shopping?: No    Assessment & Plan   1. Viral upper respiratory tract infection  - Novel Coronavirus, NAA ([facility]) - benzonatate (TESSALON) 100 MG capsule; Take 1 capsule (100 mg total) by mouth 2 (two) times daily as needed for cough.  Dispense: 40 capsule; Refill: 0 - DG Chest 2  View; Future   2. Moderate asthma with acute exacerbation, unspecified whether persistent  Use albuterol q 4 hours prn

## 2020-08-29 LAB — SARS-COV-2, NAA 2 DAY TAT

## 2020-08-29 LAB — NOVEL CORONAVIRUS, NAA: SARS-CoV-2, NAA: NOT DETECTED

## 2020-09-03 ENCOUNTER — Telehealth: Payer: Self-pay | Admitting: Family Medicine

## 2020-09-03 ENCOUNTER — Other Ambulatory Visit: Payer: Self-pay | Admitting: Family Medicine

## 2020-09-03 MED ORDER — DOXYCYCLINE HYCLATE 100 MG PO TABS: 100.0000 mg | ORAL_TABLET | Freq: Two times a day (BID) | ORAL | 0 refills | Status: DC

## 2020-09-03 NOTE — Telephone Encounter (Signed)
Pt calling in regards to his most recent appt. He states that he tested negative for covid, but still believes he has a sinus infection. He states that he is having a lot of pain and pressure on one side of his head and is requesting to have medication to help clear up the infection. Please advise.      [id number] [id number] Phone: [phone]: [phone] Hours: Not open 24 hours

## 2020-09-03 NOTE — Telephone Encounter (Signed)
Pts wife was following up on the pts previous request and wanted an update. Please advise.

## 2020-09-13 ENCOUNTER — Ambulatory Visit: Payer: Medicare Other

## 2020-09-20 ENCOUNTER — Other Ambulatory Visit: Payer: Self-pay | Admitting: Family Medicine

## 2020-09-20 DIAGNOSIS — I700 Atherosclerosis of aorta: Secondary | ICD-10-CM

## 2020-09-20 NOTE — Telephone Encounter (Signed)
Medication was ordered on 08/07/20 for # 90 with one refill. Confirmed receipt by pharmacy. This request is not needed-refusing at this time.

## 2020-09-25 ENCOUNTER — Other Ambulatory Visit: Payer: Self-pay | Admitting: Family Medicine

## 2020-09-25 DIAGNOSIS — J4540 Moderate persistent asthma, uncomplicated: Secondary | ICD-10-CM

## 2020-09-26 ENCOUNTER — Other Ambulatory Visit: Payer: Self-pay

## 2020-09-26 DIAGNOSIS — L409 Psoriasis, unspecified: Secondary | ICD-10-CM

## 2020-09-26 MED ORDER — VTAMA 1 % EX CREA: 1.0000 "application " | TOPICAL_CREAM | Freq: Two times a day (BID) | CUTANEOUS | 1 refills | Status: DC

## 2020-09-28 ENCOUNTER — Encounter: Payer: Self-pay | Admitting: Cardiology

## 2020-09-28 ENCOUNTER — Ambulatory Visit (INDEPENDENT_AMBULATORY_CARE_PROVIDER_SITE_OTHER): Payer: Medicare Other | Admitting: Cardiology

## 2020-09-28 ENCOUNTER — Other Ambulatory Visit: Payer: Self-pay

## 2020-09-28 ENCOUNTER — Telehealth: Payer: Self-pay | Admitting: Cardiology

## 2020-09-28 VITALS — BP 120/76 | HR 86 | Ht 74.0 in | Wt 296.0 lb

## 2020-09-28 DIAGNOSIS — R0602 Shortness of breath: Secondary | ICD-10-CM | POA: Diagnosis not present

## 2020-09-28 DIAGNOSIS — I82403 Acute embolism and thrombosis of unspecified deep veins of lower extremity, bilateral: Secondary | ICD-10-CM | POA: Diagnosis not present

## 2020-09-28 DIAGNOSIS — R42 Dizziness and giddiness: Secondary | ICD-10-CM

## 2020-09-28 NOTE — Progress Notes (Signed)
Cardiology Office Note:    Date:  09/28/2020   ID:  [name], DOB 18-Apr-1950, MRN [id number]  PCP:  Steele Sizer, MD   [facility] Providers Cardiologist:  None     Referring MD: Steele Sizer, MD   Chief Complaint  Patient presents with   New Patient (Initial Visit)    Referred by PCP for Atherosclerosis of abdominal aorta. Meds reviewed verbally with patient.     History of Present Illness:    [name] is a 70 y.o. male with a hx of hyperlipidemia, DVT who presents due to feeling faint with exercising.  Patient states feeling lightheadedness and faint usually with exercising.  He works his bike for about 10 miles, also works out by going to [employer].  Usually mid work-up, he sometimes feels lightheaded.  He typically stops, drink some Gatorade with occasional improvement in symptoms.  He returns to complete his work-up if symptoms improve, or stops completely if his symptoms do not.  While riding his bike outside, if he feels faint, he typically stops for about 5 to 10 minutes before continuing.  Denies any symptoms of rest.  Denies chest pain, but notes shortness of breath when he overexerts himself of late.  Denies syncope.  Has a history of DVT after knee surgery.  Placed on Eliquis, follow-up ultrasound showed persistent DVT, he was placed chronically on Eliquis.  Past Medical History:  Diagnosis Date   Allergic rhinitis    Anemia    BPH (benign prostatic hyperplasia)    Chronic kidney disease    Chronic sinusitis    Complication of anesthesia    work up during surgery 2x in the past    Decreased libido    DVT (deep venous thrombosis) ([place])    left leg after left knee surgery   ED (erectile dysfunction)    Fatigue    Hematuria    HTN (hypertension)    history of   Hypogonadism in male    Hypokalemia    history of    IBS (irritable bowel syndrome)    Low serum vitamin D    Lumbago    Migraine    Mild intermittent asthma     Osteoarthritis of hip    left (bone-on-bone) Dr. Alta Corning & Alvina Filbert. [place], PA-C   Pneumonia    walking pneumonia   Reflux    Shingles    Unilateral inguinal hernia without obstruction or gangrene     Past Surgical History:  Procedure Laterality Date   ADRENALECTOMY Left 02/11/2016   [facility]   COLONOSCOPY  02/2012   normal   JOINT REPLACEMENT     KNEE ARTHROSCOPY Left 10/06/2009   SINUS EXPLORATION     TOTAL HIP ARTHROPLASTY Left 12/14/2017   Procedure: TOTAL HIP ARTHROPLASTY ANTERIOR APPROACH;  Surgeon: Dorna Leitz, MD;  Location: [facility];  Service: Orthopedics;  Laterality: Left;   TOTAL KNEE ARTHROPLASTY Right 06/20/2016   TOTAL KNEE ARTHROPLASTY Right 06/20/2016   Procedure: TOTAL KNEE ARTHROPLASTY;  Surgeon: Dorna Leitz, MD;  Location: [facility];  Service: Orthopedics;  Laterality: Right;   TOTAL KNEE ARTHROPLASTY Left 12/19/2016   Procedure: LEFT TOTAL KNEE ARTHROPLASTY;  Surgeon: Dorna Leitz, MD;  [facility];  Service: Orthopedics;  Laterality: Left;  Adductor Block    Current Medications: Current Meds  Medication Sig   albuterol (VENTOLIN HFA) 108 (90 Base) MCG/ACT inhaler INHALE 2 PUFFS INTO THE LUNGS EVERY 4 HOURS AS NEEDED FOR WHEEZING OR SHORTNESS  OF BREATH.   apixaban (ELIQUIS) 5 MG TABS tablet Take 1 tablet (5 mg total) by mouth 2 (two) times daily.   atorvastatin (LIPITOR) 40 MG tablet Take 1 tablet (40 mg total) by mouth daily.   azelastine (OPTIVAR) 0.05 % ophthalmic solution PLACE 2 DROPS INTO BOTH EYES 2 (TWO) TIMES DAILY.   benzonatate (TESSALON) 100 MG capsule Take 1 capsule (100 mg total) by mouth 2 (two) times daily as needed for cough.   famotidine (PEPCID) 20 MG tablet Take 1 tablet (20 mg total) by mouth 2 (two) times daily.   fluticasone (FLONASE) 50 MCG/ACT nasal spray Place 2 sprays into both nostrils daily.   Fluticasone-Umeclidin-Vilant (TRELEGY ELLIPTA) 100-62.5-25 MCG/INH AEPB Inhale 1 puff into the lungs daily.   montelukast (SINGULAIR) 10 MG  tablet Take 1 tablet (10 mg total) by mouth daily.   Multiple Vitamins-Minerals (MULTIVITAMIN ADULTS 50+ PO) Take 1 tablet by mouth daily. NATURE'S CODE MEN OVER 50 MULTIVITAMIN PACK   Polyethyl Glycol-Propyl Glycol 0.4-0.3 % SOLN Place 1-2 drops into both eyes 3 (three) times daily as needed (for dry/irritated eyes.).   tadalafil (CIALIS) 5 MG tablet Take 1 tablet (5 mg total) by mouth daily.   tamsulosin (FLOMAX) 0.4 MG CAPS capsule Take 1 capsule (0.4 mg total) by mouth every evening.   Tapinarof (VTAMA) 1 % CREA Apply 1 application topically in the morning and at bedtime. To affected areas at palms   tiZANidine (ZANAFLEX) 2 MG tablet Take 1 tablet (2 mg total) by mouth 2 (two) times daily.   VELTASSA 8.4 g packet Take 1 packet by mouth daily.   vitamin B-12 (CYANOCOBALAMIN) 500 MCG tablet Take 500 mcg by mouth daily.     Allergies:   Almond (diagnostic), Lactose intolerance (gi), Peanut-containing drug products, and Shellfish allergy   Social History   Socioeconomic History   Marital status: Married    Spouse name: deborah   Number of children: 2   Years of education: Not on file   Highest education level: Professional school degree (e.g., MD, DDS, DVM, JD)  Occupational History   Occupation: [employer]  Tobacco Use   Smoking status: Former    Packs/day: 1.00    Years: 10.00    Pack years: 10.00    Types: Cigarettes    Quit date: 1980    Years since quitting: 42.7   Smokeless tobacco: Never   Tobacco comments:    38 years ago 19 when he stopped  Vaping Use   Vaping Use: Never used  Substance and Sexual Activity   Alcohol use: No    Alcohol/week: 0.0 standard drinks   Drug use: No   Sexual activity: Yes    Partners: Female  Other Topics Concern   Not on file  Social History Narrative   Patient has 8 grands   Social Determinants of Health   Financial Resource Strain: Not on file  Food Insecurity: Not on file  Transportation Needs: Not on file   Physical Activity: Not on file  Stress: Not on file  Social Connections: Not on file     Family History: The patient's family history includes Alzheimer's disease in his brother; Diabetes in his mother; Heart disease in his mother; Lung disease in his mother; Seizures in his maternal grandmother.  ROS:   Please see the history of present illness.     All other systems reviewed and are negative.  EKGs/Labs/Other Studies Reviewed:    The following studies were reviewed today:  EKG:  EKG is  ordered today.  The ekg ordered today demonstrates normal sinus rhythm, normal ECG  Recent Labs: 08/06/2020: ALT 21; BUN 32; Creat 1.64; Hemoglobin 11.9; Platelets 212; Potassium 4.4; Sodium 138  Recent Lipid Panel    Component Value Date/Time   CHOL 164 08/06/2020 1610   CHOL 201 (H) 06/20/2015 1225   TRIG 128 08/06/2020 1610   HDL 42 08/06/2020 1610   HDL 50 06/20/2015 1225   CHOLHDL 3.9 08/06/2020 1610   LDLCALC 100 (H) 08/06/2020 1610     Risk Assessment/Calculations:          Physical Exam:    VS:  BP 120/76 (BP Location: Left Arm, Patient Position: Sitting, Cuff Size: Large)   Pulse 86   Ht 6\' 2"  (1.88 m)   Wt 296 lb (134.3 kg)   SpO2 96%   BMI 38.00 kg/m     Wt Readings from Last 3 Encounters:  09/28/20 296 lb (134.3 kg)  08/28/20 298 lb (135.2 kg)  08/06/20 (!) 305 lb (138.3 kg)     GEN:  Well nourished, well developed in no acute distress HEENT: Normal NECK: No JVD; No carotid bruits LYMPHATICS: No lymphadenopathy CARDIAC: RRR, no murmurs, rubs, gallops RESPIRATORY:  Clear to auscultation without rales, wheezing or rhonchi  ABDOMEN: Soft, non-tender, non-distended MUSCULOSKELETAL:  No edema; No deformity  SKIN: Warm and dry NEUROLOGIC:  Alert and oriented x 3 PSYCHIATRIC:  Normal affect   ASSESSMENT:    1. SOB (shortness of breath)   2. Feeling faint   3. Deep vein thrombosis (DVT) of both lower extremities, unspecified chronicity, unspecified vein  (HCC)    PLAN:    In order of problems listed above:  Shortness of breath, not consistent with angina.  Obtain echocardiogram to evaluate any significant structural abnormalities. Feeling of faintness associated with exercising.  Patient encouraged on adequate hydration, Intake Prior to Exercising to Prevent Orthostasis, Dehydration, Hypoglycemia.  I do not believe there is a cardiac etiology to his faint feeling.  Denies Palpitations, Chest Pain. DVT, on Eliquis.  Follow-up after echocardiogram     Medication Adjustments/Labs and Tests Ordered: Current medicines are reviewed at length with the patient today.  Concerns regarding medicines are outlined above.  Orders Placed This Encounter  Procedures   EKG 12-Lead   ECHOCARDIOGRAM COMPLETE   No orders of the defined types were placed in this encounter.   Patient Instructions  Medication Instructions:   Your physician recommends that you continue on your current medications as directed. Please refer to the Current Medication list given to you today.  *If you need a refill on your cardiac medications before your next appointment, please call your pharmacy*   Lab Work: None ordered If you have labs (blood work) drawn today and your tests are completely normal, you will receive your results only by: [place] (if you have MyChart) OR A paper copy in the mail If you have any lab test that is abnormal or we need to change your treatment, we will call you to review the results.   Testing/Procedures:  Your physician has requested that you have an echocardiogram. Echocardiography is a painless test that uses sound waves to create images of your heart. It provides your doctor with information about the size and shape of your heart and how well your heart's chambers and valves are working. This procedure takes approximately one hour. There are no restrictions for this procedure.    Follow-Up: At [facility], you and  your  health needs are our priority.  As part of our continuing mission to provide you with exceptional heart care, we have created designated Provider Care Teams.  These Care Teams include your primary Cardiologist (physician) and Advanced Practice Providers (APPs -  Physician Assistants and Nurse Practitioners) who all work together to provide you with the care you need, when you need it.  We recommend signing up for the patient portal called "MyChart".  Sign up information is provided on this After Visit Summary.  MyChart is used to connect with patients for Virtual Visits (Telemedicine).  Patients are able to view lab/test results, encounter notes, upcoming appointments, etc.  Non-urgent messages can be sent to your provider as well.   To learn more about what you can do with MyChart, go to [email or web address].    Your next appointment:   6 week(s)  The format for your next appointment:   In Person  Provider:   Kate Sable, MD   Other Instructions    Signed, Kate Sable, MD  09/28/2020 5:08 PM    [place]

## 2020-09-28 NOTE — Telephone Encounter (Signed)
Patient works and drives from [employer] and would like to have provider review necessity of fu when echo results are done

## 2020-09-28 NOTE — Patient Instructions (Signed)
Medication Instructions:   Your physician recommends that you continue on your current medications as directed. Please refer to the Current Medication list given to you today.  *If you need a refill on your cardiac medications before your next appointment, please call your pharmacy*   Lab Work: None ordered If you have labs (blood work) drawn today and your tests are completely normal, you will receive your results only by: [place] (if you have MyChart) OR A paper copy in the mail If you have any lab test that is abnormal or we need to change your treatment, we will call you to review the results.   Testing/Procedures:  Your physician has requested that you have an echocardiogram. Echocardiography is a painless test that uses sound waves to create images of your heart. It provides your doctor with information about the size and shape of your heart and how well your heart's chambers and valves are working. This procedure takes approximately one hour. There are no restrictions for this procedure.    Follow-Up: At [facility], you and your health needs are our priority.  As part of our continuing mission to provide you with exceptional heart care, we have created designated Provider Care Teams.  These Care Teams include your primary Cardiologist (physician) and Advanced Practice Providers (APPs -  Physician Assistants and Nurse Practitioners) who all work together to provide you with the care you need, when you need it.  We recommend signing up for the patient portal called "MyChart".  Sign up information is provided on this After Visit Summary.  MyChart is used to connect with patients for Virtual Visits (Telemedicine).  Patients are able to view lab/test results, encounter notes, upcoming appointments, etc.  Non-urgent messages can be sent to your provider as well.   To learn more about what you can do with MyChart, go to [email or web address].    Your next appointment:   6  week(s)  The format for your next appointment:   In Person  Provider:   Kate Sable, MD   Other Instructions

## 2020-10-02 NOTE — Telephone Encounter (Signed)
Noted  

## 2020-10-11 ENCOUNTER — Ambulatory Visit: Payer: BC Managed Care – PPO | Admitting: Dermatology

## 2020-11-02 ENCOUNTER — Other Ambulatory Visit: Payer: BC Managed Care – PPO

## 2020-11-06 ENCOUNTER — Telehealth: Payer: Self-pay

## 2020-11-06 ENCOUNTER — Other Ambulatory Visit: Payer: Self-pay | Admitting: Family Medicine

## 2020-11-06 DIAGNOSIS — N529 Male erectile dysfunction, unspecified: Secondary | ICD-10-CM

## 2020-11-06 NOTE — Telephone Encounter (Signed)
Copied from [place] [id number]. Topic: General - Inquiry >> Nov 06, 2020 11:28 AM Greggory Keen D wrote: Reason for CRM: Pt's wife called asking if dr. Ancil Boozer nurse could give her a call regarding a special prescription.  She was told to call the nurse  [phone]

## 2020-11-06 NOTE — Telephone Encounter (Signed)
Wife called needs new rx written for tadalafil take 1 tab daily and a extra pill prn for intercourse #90  She said that he has talked to you about this?  And you have wrote it before last on 12/16/19

## 2020-11-07 ENCOUNTER — Other Ambulatory Visit: Payer: Self-pay | Admitting: Family Medicine

## 2020-11-07 DIAGNOSIS — N529 Male erectile dysfunction, unspecified: Secondary | ICD-10-CM

## 2020-11-07 MED ORDER — TADALAFIL 5 MG PO TABS: 5.0000 mg | ORAL_TABLET | Freq: Every day | ORAL | 1 refills | Status: DC

## 2020-11-07 NOTE — Telephone Encounter (Signed)
notified

## 2020-11-07 NOTE — Telephone Encounter (Signed)
[facility] so they can use good rx

## 2020-11-09 ENCOUNTER — Ambulatory Visit: Payer: BC Managed Care – PPO | Admitting: Cardiology

## 2020-11-19 DIAGNOSIS — N1832 Chronic kidney disease, stage 3b: Secondary | ICD-10-CM | POA: Diagnosis not present

## 2020-11-19 DIAGNOSIS — D631 Anemia in chronic kidney disease: Secondary | ICD-10-CM | POA: Diagnosis not present

## 2020-11-19 DIAGNOSIS — N2581 Secondary hyperparathyroidism of renal origin: Secondary | ICD-10-CM | POA: Diagnosis not present

## 2020-11-19 DIAGNOSIS — E875 Hyperkalemia: Secondary | ICD-10-CM | POA: Diagnosis not present

## 2020-11-26 ENCOUNTER — Telehealth: Payer: Self-pay

## 2020-11-26 NOTE — Telephone Encounter (Signed)
Copied from [place] [id number]. Topic: General - Other >> Nov 26, 2020  3:14 PM Pawlus, Brayton Layman A wrote: Reason for CRM: Pt called in requesting his last Flu shot information and if he should be getting one this year, please advise.

## 2020-11-26 NOTE — Telephone Encounter (Signed)
Pt aware he is due for his flu shot. He verbalized understanding.

## 2020-11-30 DIAGNOSIS — Z23 Encounter for immunization: Secondary | ICD-10-CM | POA: Diagnosis not present

## 2020-12-03 ENCOUNTER — Other Ambulatory Visit: Payer: Self-pay | Admitting: Family Medicine

## 2020-12-03 DIAGNOSIS — J4540 Moderate persistent asthma, uncomplicated: Secondary | ICD-10-CM

## 2020-12-05 ENCOUNTER — Ambulatory Visit (INDEPENDENT_AMBULATORY_CARE_PROVIDER_SITE_OTHER): Payer: Medicare Other | Admitting: Family Medicine

## 2020-12-05 ENCOUNTER — Encounter: Payer: Self-pay | Admitting: Family Medicine

## 2020-12-05 ENCOUNTER — Other Ambulatory Visit: Payer: Self-pay

## 2020-12-05 VITALS — BP 122/78 | HR 68 | Resp 18 | Ht 74.0 in | Wt 288.0 lb

## 2020-12-05 DIAGNOSIS — J4540 Moderate persistent asthma, uncomplicated: Secondary | ICD-10-CM

## 2020-12-05 MED ORDER — PREDNISONE 20 MG PO TABS: 40.0000 mg | ORAL_TABLET | Freq: Every day | ORAL | 0 refills | Status: AC

## 2020-12-05 NOTE — Progress Notes (Signed)
    SUBJECTIVE:   CHIEF COMPLAINT / HPI:   UPPER RESPIRATORY TRACT INFECTION - received COVID vaccine Tuesday - received flu and shingles vaccines on Friday - symptoms started 11/25 - COVID test negative yesterday - using albuterol every 4 hours, daily.   Fever: no Cough: yes, dry hacking Shortness of breath: yes, albuterol helps Wheezing: no Chest pain: no Chest tightness:  a little Chest congestion: yes Nasal congestion: yes, no more than usual Runny nose: no Headache: yes Face pain: no Ear pain: yes left Ear pressure: yes left Vomiting: no Rash: no Fatigue: yes Sick contacts: no Relief with OTC cold/cough medications: yes  Treatments attempted: albuterol, flonase, dayquil/nyquil, trelegy, singulair, tylenol   OBJECTIVE:   BP 122/78   Pulse 68   Resp 18   Ht 6\' 2"  (1.88 m)   Wt 288 lb (130.6 kg)   SpO2 98%   BMI 36.98 kg/m   Gen: well appearing, in NAD Card: RRR Lungs: CTAB. Speaking in few word sentences. No wheezing. Appropriately saturated on RA   ASSESSMENT/PLAN:   Asthma, well controlled, moderate persistent With current exacerbation in setting of URI. Rx steroid burst x5 days. F/u if no better after treatment. Continue inhalers. Emergency precautions discussed.      Myles Gip, DO

## 2020-12-05 NOTE — Assessment & Plan Note (Signed)
With current exacerbation in setting of URI. Rx steroid burst x5 days. F/u if no better after treatment. Continue inhalers. Emergency precautions discussed.

## 2020-12-05 NOTE — Patient Instructions (Signed)
It was great to see you!  Our plans for today:  - Take the steroids as prescribed. - Keep taking your inhalers. - If you develop shortness of breath at rest and albuterol isn't helping, go to the hospital.  Take care and seek immediate care sooner if you develop any concerns.   Dr. Ky Barban

## 2020-12-06 ENCOUNTER — Ambulatory Visit (INDEPENDENT_AMBULATORY_CARE_PROVIDER_SITE_OTHER): Payer: Medicare Other

## 2020-12-06 DIAGNOSIS — R0602 Shortness of breath: Secondary | ICD-10-CM | POA: Diagnosis not present

## 2020-12-06 LAB — ECHOCARDIOGRAM COMPLETE
AR max vel: 2.9 cm2
AV Area VTI: 3.44 cm2
AV Area mean vel: 3.17 cm2
AV Mean grad: 4 mmHg
AV Peak grad: 8.4 mmHg
Ao pk vel: 1.45 m/s
Area-P 1/2: 4.12 cm2
Calc EF: 67 %
S' Lateral: 3.1 cm
Single Plane A2C EF: 70.3 %
Single Plane A4C EF: 65.3 %

## 2020-12-07 ENCOUNTER — Telehealth: Payer: Self-pay | Admitting: Cardiology

## 2020-12-07 NOTE — Telephone Encounter (Signed)
Please call with echo results 

## 2020-12-07 NOTE — Telephone Encounter (Signed)
Called patient and informed him that Dr. Garen Lah reviewed his Echocardiogram and everything looked normal. His recommendations were to follow up as needed. We cancelled his appointment for Monday 12/10/20.  Patient was grateful for the call back, verbalized understanding, and agreed with plan.

## 2020-12-10 ENCOUNTER — Ambulatory Visit: Payer: BC Managed Care – PPO | Admitting: Cardiology

## 2021-02-14 ENCOUNTER — Other Ambulatory Visit: Payer: Self-pay | Admitting: Family Medicine

## 2021-02-14 DIAGNOSIS — J302 Other seasonal allergic rhinitis: Secondary | ICD-10-CM

## 2021-02-14 DIAGNOSIS — J3089 Other allergic rhinitis: Secondary | ICD-10-CM

## 2021-02-14 DIAGNOSIS — J4540 Moderate persistent asthma, uncomplicated: Secondary | ICD-10-CM

## 2021-02-14 DIAGNOSIS — I700 Atherosclerosis of aorta: Secondary | ICD-10-CM

## 2021-02-14 NOTE — Telephone Encounter (Signed)
Requested Prescriptions  Pending Prescriptions Disp Refills   montelukast (SINGULAIR) 10 MG tablet [Pharmacy Med Name: MONTELUKAST SOD 10 MG TABLET] 90 tablet 1    Sig: TAKE 1 TABLET BY MOUTH EVERY DAY     Pulmonology:  Leukotriene Inhibitors Passed - 02/14/2021 12:20 PM      Passed - Valid encounter within last 12 months    Recent Outpatient Visits          2 months ago Asthma, well controlled, moderate persistent   [facility] Rory Percy M, DO   5 months ago Viral upper respiratory tract infection   [facility] Steele Sizer, MD   6 months ago Atherosclerosis of abdominal aorta [facility])   [facility] Steele Sizer, MD   1 year ago Atherosclerosis of abdominal aorta [facility])   [facility] Steele Sizer, MD   1 year ago Medicare annual wellness visit, initial   [facility] Steele Sizer, MD

## 2021-03-01 ENCOUNTER — Other Ambulatory Visit: Payer: Self-pay | Admitting: Family Medicine

## 2021-03-01 DIAGNOSIS — J4540 Moderate persistent asthma, uncomplicated: Secondary | ICD-10-CM

## 2021-03-04 DIAGNOSIS — Z20822 Contact with and (suspected) exposure to covid-19: Secondary | ICD-10-CM | POA: Diagnosis not present

## 2021-03-06 ENCOUNTER — Other Ambulatory Visit: Payer: Self-pay | Admitting: Family Medicine

## 2021-03-06 DIAGNOSIS — J4540 Moderate persistent asthma, uncomplicated: Secondary | ICD-10-CM

## 2021-03-07 DIAGNOSIS — Z20822 Contact with and (suspected) exposure to covid-19: Secondary | ICD-10-CM | POA: Diagnosis not present

## 2021-03-08 ENCOUNTER — Other Ambulatory Visit: Payer: Self-pay | Admitting: Family Medicine

## 2021-03-08 ENCOUNTER — Ambulatory Visit: Payer: Self-pay

## 2021-03-08 DIAGNOSIS — J4540 Moderate persistent asthma, uncomplicated: Secondary | ICD-10-CM

## 2021-03-08 NOTE — Telephone Encounter (Signed)
Pts wife called back to follow up on the Rx refill, please advise.  ?

## 2021-03-08 NOTE — Telephone Encounter (Signed)
? ?  Chief Complaint: Medication not refilled ?Symptoms: Needs medication Trelegy - Pt on last dose. ?Frequency: na ?Pertinent Negatives: Patient denies SOB ?Disposition: [] ED /[] Urgent Care (no appt availability in office) / [] Appointment(In office/virtual)/ []  [place] [facility]/ [] Home Care/ [] Refused Recommended Disposition /[] [place] [facility]/ [x]  Follow-up with PCP ?Additional Notes: Pt is on his last dose of Trelegy. Pt and his wife have been working to get refill since  02/27/2021. Unable to contact provider for refill. Per returned Rx request pt needs an appt for refills. Pt last seen 11/2020.  Called Dr. Brita Romp (on call) for refill authorization. Verbal order to refill medication given. Called [phone] and spoke with Shanon Brow to refill this medication.  Follow up appointment scheduled for 05/14/2021.  ? ? ? ? ? ?Reason for Disposition ? [1] Prescription refill request for ESSENTIAL medicine (i.e., likelihood of harm to patient if not taken) AND [2] triager unable to refill per department policy ? ?Answer Assessment - Initial Assessment Questions ?1. DRUG NAME: "What medicine do you need to have refilled?" ?    trelegy ?2. REFILLS REMAINING: "How many refills are remaining?" (Note: The label on the medicine or pill bottle will show how many refills are remaining. If there are no refills remaining, then a renewal may be needed.) ?    1 dose ?3. EXPIRATION DATE: "What is the expiration date?" (Note: The label states when the prescription will expire, and thus can no longer be refilled.) ?    na ?4. PRESCRIBING HCP: "Who prescribed it?" Reason: If prescribed by specialist, call should be referred to that group. ?    [place] ?5. SYMPTOMS: "Do you have any symptoms?" ?    na ?6. PREGNANCY: "Is there any chance that you are pregnant?" "When was your last menstrual period?" ?    na ? ?Protocols used: Medication Refill and Renewal Call-A-AH ? ?

## 2021-03-08 NOTE — Telephone Encounter (Signed)
Requested medication (s) are due for refill today: Yes ? ?Requested medication (s) are on the active medication list: Yes ? ?Last refill:  7 mos ago ? ?Future visit scheduled: Yes ? ?Notes to clinic:  Unable to refill per protocol due to med not assigned to protocol, will not let NT refill when attempted, patient out of med ? ? ? ? ? ?Requested Prescriptions  ?Pending Prescriptions Disp Refills  ? Fluticasone-Umeclidin-Vilant (TRELEGY ELLIPTA) 100-62.5-25 MCG/ACT AEPB 60 each 5  ?  Sig: Inhale 1 puff into the lungs daily.  ?  ? Off-Protocol Failed - 03/08/2021  6:15 PM  ?  ?  Failed - Medication not assigned to a protocol, review manually.  ?  ?  Passed - Valid encounter within last 12 months  ?  Recent Outpatient Visits   ? ?      ? 3 months ago Asthma, well controlled, moderate persistent  ? [place], DO  ? 6 months ago Viral upper respiratory tract infection  ? [facility], Drue Stager, MD  ? 7 months ago Atherosclerosis of abdominal aorta [facility])  ? [facility] [place], Drue Stager, MD  ? 1 year ago Atherosclerosis of abdominal aorta [facility])  ? [facility] Steele Sizer, MD  ? 1 year ago Medicare annual wellness visit, initial  ? [facility] Steele Sizer, MD  ? ?  ?  ?Future Appointments   ? ?        ? In 2 months Steele Sizer, MD [facility] [place]  ? ?  ? ?  ?  ?  ? ? ? ? ?

## 2021-03-09 ENCOUNTER — Other Ambulatory Visit: Payer: Self-pay | Admitting: Family Medicine

## 2021-03-09 DIAGNOSIS — K219 Gastro-esophageal reflux disease without esophagitis: Secondary | ICD-10-CM

## 2021-03-11 ENCOUNTER — Other Ambulatory Visit: Payer: Self-pay | Admitting: Family Medicine

## 2021-03-11 ENCOUNTER — Encounter: Payer: Self-pay | Admitting: Physician Assistant

## 2021-03-11 ENCOUNTER — Ambulatory Visit
Admission: RE | Admit: 2021-03-11 | Discharge: 2021-03-11 | Disposition: A | Discharge status: Home / Self Care | Payer: Medicare Other | Attending: Physician Assistant | Admitting: Physician Assistant

## 2021-03-11 ENCOUNTER — Ambulatory Visit (INDEPENDENT_AMBULATORY_CARE_PROVIDER_SITE_OTHER): Payer: Medicare Other | Admitting: Physician Assistant

## 2021-03-11 ENCOUNTER — Ambulatory Visit
Admission: RE | Admit: 2021-03-11 | Discharge: 2021-03-11 | Disposition: A | Discharge status: Home / Self Care | Payer: Medicare Other | Source: Ambulatory Visit | Attending: Physician Assistant | Admitting: Physician Assistant

## 2021-03-11 ENCOUNTER — Other Ambulatory Visit: Payer: Self-pay

## 2021-03-11 ENCOUNTER — Ambulatory Visit: Payer: Self-pay

## 2021-03-11 VITALS — BP 110/66 | HR 96 | Temp 98.4°F | Resp 16 | Ht 74.0 in | Wt 286.0 lb

## 2021-03-11 DIAGNOSIS — J4540 Moderate persistent asthma, uncomplicated: Secondary | ICD-10-CM | POA: Diagnosis not present

## 2021-03-11 DIAGNOSIS — R0602 Shortness of breath: Secondary | ICD-10-CM | POA: Diagnosis not present

## 2021-03-11 DIAGNOSIS — R197 Diarrhea, unspecified: Secondary | ICD-10-CM

## 2021-03-11 MED ORDER — TRELEGY ELLIPTA 100-62.5-25 MCG/ACT IN AEPB: 1.0000 | INHALATION_SPRAY | Freq: Every day | RESPIRATORY_TRACT | 5 refills | Status: DC

## 2021-03-11 NOTE — Progress Notes (Signed)
Acute Office Visit  Subjective:    Patient ID: [name], male    DOB: 01-29-1950, 70 y.o.   MRN: [id number]  Today's Provider: Talitha Givens, MHS, PA-C Introduced myself to the patient as a PA-C and provided education on APPs in clinical practice.   Chief Complaint  Patient presents with   Diarrhea    Started on 2/28 watery stool around the weekend stool started to get a little more solid and started back on being watery again. Today he went to work and felt lightheaded and tired.    HPI Patient is in today for diarrhea States Tuesday he started having diarrhea Thursday and Friday developed intense headaches- had to stay home from work States Saturday and Sunday diarrhea seemed to have improved so he tried to go back to work today.  Has had 4 watery stools today and reports lightheadedness today  Denies changes to diet Denies sick contacts or concurrent illness in others at home.  States the whole time he was feeling congested and now reports chest congestion   He has not taken anything for his diarrhea symptoms He has been using his inhalers and they seem to be beneficial to his congestion    Past Medical History:  Diagnosis Date   Allergic rhinitis    Anemia    BPH (benign prostatic hyperplasia)    Chronic kidney disease    Chronic sinusitis    Complication of anesthesia    work up during surgery 2x in the past    Decreased libido    DVT (deep venous thrombosis) ([place])    left leg after left knee surgery   ED (erectile dysfunction)    Fatigue    Hematuria    HTN (hypertension)    history of   Hypogonadism in male    Hypokalemia    history of    IBS (irritable bowel syndrome)    Low serum vitamin D    Lumbago    Migraine    Mild intermittent asthma    Osteoarthritis of hip    left (bone-on-bone) Dr. Alta Corning & Alvina Filbert. 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   Procedure: TOTAL HIP ARTHROPLASTY ANTERIOR APPROACH;  Surgeon: Dorna Leitz, MD;  Location: [place];  Service: Orthopedics;  Laterality: Left;   TOTAL KNEE ARTHROPLASTY Right 06/20/2016   TOTAL KNEE ARTHROPLASTY Right 06/20/2016   Procedure: TOTAL KNEE ARTHROPLASTY;  Surgeon: Dorna Leitz, MD;  Location: [place];  Service: Orthopedics;  Laterality: Right;   TOTAL KNEE ARTHROPLASTY Left 12/19/2016   Procedure: LEFT TOTAL KNEE ARTHROPLASTY;  Surgeon: Dorna Leitz, MD;  Location: [facility];  Service: Orthopedics;  Laterality: Left;  Adductor Block    Family History  Problem Relation Age of Onset   Diabetes Mother    Heart disease Mother    Lung disease Mother    Seizures Maternal Grandmother    Alzheimer's disease Brother     Social History   Socioeconomic History   Marital status: Married    Spouse name: deborah   Number of children:  2   Years of education: Not on file   Highest education level: Professional school degree (e.g., MD, DDS, DVM, JD)  Occupational History   Occupation: [employer]  Tobacco Use   Smoking status: Former    Packs/day: 1.00    Years: 10.00    Pack years: 10.00    Types: Cigarettes    Quit date: 1980    Years since quitting: 43.2   Smokeless tobacco: Never   Tobacco comments:    39 years ago 28 when he stopped  Vaping Use   Vaping Use: Never used  Substance and Sexual Activity   Alcohol use: No    Alcohol/week: 0.0 standard drinks   Drug use: No   Sexual activity: Yes    Partners: Female  Other Topics Concern   Not on file  Social History Narrative   Patient has 8 grands   Social Determinants of Health   Financial Resource Strain: Not on file  Food Insecurity: Not on file   Transportation Needs: Not on file  Physical Activity: Not on file  Stress: Not on file  Social Connections: Not on file  Intimate Partner Violence: Not on file    Outpatient Medications Prior to Visit  Medication Sig Dispense Refill   albuterol (VENTOLIN HFA) 108 (90 Base) MCG/ACT inhaler INHALE 2 PUFFS BY MOUTH EVERY 4 HOURS AS NEEDED FOR WHEEZE OR FOR SHORTNESS OF BREATH 8.5 each 0   apixaban (ELIQUIS) 5 MG TABS tablet Take 1 tablet (5 mg total) by mouth 2 (two) times daily. 60 tablet 5   atorvastatin (LIPITOR) 40 MG tablet Take 1 tablet (40 mg total) by mouth daily. 90 tablet 1   azelastine (OPTIVAR) 0.05 % ophthalmic solution PLACE 2 DROPS INTO BOTH EYES 2 (TWO) TIMES DAILY. 18 mL 1   benzonatate (TESSALON) 100 MG capsule Take 1 capsule (100 mg total) by mouth 2 (two) times daily as needed for cough. 40 capsule 0   famotidine (PEPCID) 20 MG tablet TAKE 1 TABLET BY MOUTH TWICE A DAY 180 tablet 1   fluticasone (FLONASE) 50 MCG/ACT nasal spray Place 2 sprays into both nostrils daily. 48 mL 1   Fluticasone-Umeclidin-Vilant (TRELEGY ELLIPTA) 100-62.5-25 MCG/ACT AEPB Inhale 1 puff into the lungs daily. 1 each 5   montelukast (SINGULAIR) 10 MG tablet TAKE 1 TABLET BY MOUTH EVERY DAY 90 tablet 1   Multiple Vitamins-Minerals (MULTIVITAMIN ADULTS 50+ PO) Take 1 tablet by mouth daily. NATURE'S CODE MEN OVER 50 MULTIVITAMIN PACK     Polyethyl Glycol-Propyl Glycol 0.4-0.3 % SOLN Place 1-2 drops into both eyes 3 (three) times daily as needed (for dry/irritated eyes.).     tadalafil (CIALIS) 5 MG tablet Take 1 tablet (5 mg total) by mouth daily. May take two prn intercourse 90 tablet 1   tamsulosin (FLOMAX) 0.4 MG CAPS capsule Take 1 capsule (0.4 mg total) by mouth every evening. 90 capsule 1   Tapinarof (VTAMA) 1 % CREA Apply 1 application topically in the morning and at bedtime. To affected areas at palms 60 g 1   tiZANidine (ZANAFLEX) 2 MG tablet Take 1 tablet (2 mg total) by mouth 2 (two) times  daily. 180 tablet 1   VELTASSA 8.4 g packet Take 1 packet by mouth daily.     vitamin B-12 (CYANOCOBALAMIN) 500 MCG tablet Take 500 mcg by mouth daily.     No facility-administered medications prior to visit.    Allergies  Allergen Reactions   Almond (Diagnostic) Other (See Comments)  Migraines   Lactose Intolerance (Gi) Other (See Comments)    MIGRAINES   Peanut-Containing Drug Products Other (See Comments)    Migraines   Shellfish Allergy Other (See Comments)    Congestion/breathing problems/migraines.    Review of Systems  Constitutional:  Positive for fatigue. Negative for diaphoresis and fever.  HENT:  Positive for congestion, sinus pressure and sinus pain. Negative for sore throat.   Respiratory:  Positive for cough and shortness of breath.   Cardiovascular:  Negative for chest pain and palpitations.  Gastrointestinal:  Positive for diarrhea. Negative for anal bleeding, blood in stool, nausea and vomiting.  Genitourinary:  Negative for dysuria.  Musculoskeletal:  Negative for arthralgias and myalgias.  Neurological:  Positive for light-headedness and headaches. Negative for dizziness and weakness.      Objective:    Physical Exam Vitals reviewed.  Constitutional:      General: He is awake.     Appearance: Normal appearance. He is well-developed and well-groomed. He is obese.  HENT:     Head: Normocephalic and atraumatic.     Mouth/Throat:     Pharynx: Oropharynx is clear. Uvula midline. No oropharyngeal exudate or posterior oropharyngeal erythema.  Eyes:     General: Lids are normal.     Extraocular Movements: Extraocular movements intact.     Conjunctiva/sclera: Conjunctivae normal.     Pupils: Pupils are equal, round, and reactive to light.  Cardiovascular:     Rate and Rhythm: Normal rate and regular rhythm.     Pulses: Normal pulses.     Heart sounds: Normal heart sounds.  Pulmonary:     Effort: Pulmonary effort is normal.     Breath sounds: Decreased  air movement present. Decreased breath sounds present. No wheezing, rhonchi or rales.  Abdominal:     General: Abdomen is protuberant. Bowel sounds are normal.     Palpations: Abdomen is soft.     Tenderness: There is no abdominal tenderness.     Hernia: No hernia is present.  Lymphadenopathy:     Head:     Right side of head: Submandibular adenopathy present. No submental adenopathy.     Left side of head: No submental or submandibular adenopathy.     Upper Body:     Right upper body: No supraclavicular adenopathy.     Left upper body: No supraclavicular adenopathy.  Neurological:     General: No focal deficit present.     Mental Status: He is alert and oriented to person, place, and time.  Psychiatric:        Mood and Affect: Mood normal.        Behavior: Behavior normal. Behavior is cooperative.        Thought Content: Thought content normal.        Judgment: Judgment normal.       BP 110/66    Pulse 96    Temp 98.4 F (36.9 C) (Oral)    Resp 16    Ht 6' 2"  (1.88 m)    Wt 286 lb (129.7 kg)    SpO2 95%    BMI 36.72 kg/m  Wt Readings from Last 3 Encounters:  03/11/21 286 lb (129.7 kg)  12/05/20 288 lb (130.6 kg)  09/28/20 296 lb (134.3 kg)    Health Maintenance Due  Topic Date Due   COVID-19 Vaccine (6 - Booster for Moderna series) 01/22/2021   Zoster Vaccines- Shingrix (2 of 2) 01/25/2021    There are no preventive care reminders to  display for this patient.   Lab Results  Component Value Date   TSH 1.63 03/11/2016   Lab Results  Component Value Date   WBC 5.9 08/06/2020   HGB 11.9 (L) 08/06/2020   HCT 35.6 (L) 08/06/2020   MCV 84.8 08/06/2020   PLT 212 08/06/2020   Lab Results  Component Value Date   NA 138 08/06/2020   K 4.4 08/06/2020   CO2 28 08/06/2020   GLUCOSE 95 08/06/2020   BUN 32 (H) 08/06/2020   CREATININE 1.64 (H) 08/06/2020   BILITOT 0.4 08/06/2020   ALKPHOS 82 12/02/2017   AST 27 08/06/2020   ALT 21 08/06/2020   PROT 7.5 08/06/2020    ALBUMIN 4.0 12/02/2017   CALCIUM 9.6 08/06/2020   ANIONGAP 11 12/16/2017   EGFR 45 (L) 08/06/2020   Lab Results  Component Value Date   CHOL 164 08/06/2020   Lab Results  Component Value Date   HDL 42 08/06/2020   Lab Results  Component Value Date   LDLCALC 100 (H) 08/06/2020   Lab Results  Component Value Date   TRIG 128 08/06/2020   Lab Results  Component Value Date   CHOLHDL 3.9 08/06/2020   Lab Results  Component Value Date   HGBA1C 5.5 09/15/2017       Assessment & Plan:     Problem List Items Addressed This Visit       Respiratory   Asthma, well controlled, moderate persistent    Likely currently exacerbated with URI symptoms Decreased air movement noted  Recommend continued inhaler use  Chest DG to evaluate for acute changes that may necessitate abx or further intervention Results to dictate management        Other Visit Diagnoses     Diarrhea of presumed infectious origin    -  Primary Acute, new problem Likely linked to recent illness - suspect viral illness given URI symptoms and GI involvement Recommend staying well hydrated with water and Pedialyte to assist with fatigue, lightheadedness Discussed return and ED precautions Will check CMP, CBC as well for further evaluation and management direction. Discussed utilizing bland diet and potential Pepto bismol or Imodium to assist with symptoms.  Follow up as needed for lingering symptoms.    Relevant Orders   Comprehensive Metabolic Panel (CMET)   CBC w/Diff/Platelet   Shortness of breath     Suspect this is acute asthma exacerbation from URI but will check DG chest for acute illness requiring further intervention Recommend continued use of inhalers and more frequency albuterol use to assist with breathing.  Follow up as needed for lingering symptoms Results of imaging to dictate further management    Relevant Orders   DG Chest 2 View        No follow-ups on file.   I, Jailani Hogans E  Almon Whitford, PA-C, have reviewed all documentation for this visit. The documentation on 03/11/21 for the exam, diagnosis, procedures, and orders are all accurate and complete.   Teegan Brandis, [place] MPH [facility]   No orders of the defined types were placed in this encounter.    Joshus Rogan E Zanylah Hardie, PA-C

## 2021-03-11 NOTE — Assessment & Plan Note (Signed)
Likely currently exacerbated with URI symptoms ?Decreased air movement noted  ?Recommend continued inhaler use  ?Chest DG to evaluate for acute changes that may necessitate abx or further intervention ?Results to dictate management  ? ?

## 2021-03-11 NOTE — Telephone Encounter (Signed)
? ? ?  Chief Complaint: Diarrhea that started last week. "Got better then started again today."  ?Symptoms: Mild cramping ?Frequency: Last week ?Pertinent Negatives: Patient denies fever ?Disposition: '[]'$ ED /'[]'$ Urgent Care (no appt availability in office) / '[x]'$ Appointment(In office/virtual)/ '[]'$  [place] [facility]/ '[]'$ Home Care/ '[]'$ Refused Recommended Disposition /'[]'$ [place]/ '[]'$  Follow-up with PCP ?Additional Notes:   ?Reason for Disposition ? [1] MODERATE diarrhea (e.g., 4-6 times / day more than normal) AND [2] present > 48 hours (2 days) ? ?Answer Assessment - Initial Assessment Questions ?1. DIARRHEA SEVERITY: "How bad is the diarrhea?" "How many more stools have you had in the past 24 hours than normal?"  ?  - NO DIARRHEA (SCALE 0) ?  - MILD (SCALE 1-3): Few loose or mushy BMs; increase of 1-3 stools over normal daily number of stools; mild increase in ostomy output. ?  -  MODERATE (SCALE 4-7): Increase of 4-6 stools daily over normal; moderate increase in ostomy output. ?* SEVERE (SCALE 8-10; OR 'WORST POSSIBLE'): Increase of 7 or more stools daily over normal; moderate increase in ostomy output; incontinence. ?    Today - ?2. ONSET: "When did the diarrhea begin?"  ?    Last Tuesday ?3. BM CONSISTENCY: "How loose or watery is the diarrhea?"  ?    Watery ?4. VOMITING: "Are you also vomiting?" If Yes, ask: "How many times in the past 24 hours?"  ?    No ?5. ABDOMINAL PAIN: "Are you having any abdominal pain?" If Yes, ask: "What does it feel like?" (e.g., crampy, dull, intermittent, constant)  ?    No ?6. ABDOMINAL PAIN SEVERITY: If present, ask: "How bad is the pain?"  (e.g., Scale 1-10; mild, moderate, or severe) ?  - MILD (1-3): doesn't interfere with normal activities, abdomen soft and not tender to touch  ?  - MODERATE (4-7): interferes with normal activities or awakens from sleep, abdomen tender to touch  ?  - SEVERE (8-10): excruciating pain, doubled over, unable to do any normal activities    ?    Mild ?7. ORAL INTAKE: If vomiting, "Have you been able to drink liquids?" "How much liquids have you had in the past 24 hours?" ?    Yes ?8. HYDRATION: "Any signs of dehydration?" (e.g., dry mouth [not just dry lips], too weak to stand, dizziness, new weight loss) "When did you last urinate?" ?    Dizziness today ?9. EXPOSURE: "Have you traveled to a foreign country recently?" "Have you been exposed to anyone with diarrhea?" "Could you have eaten any food that was spoiled?" ?    No ?10. ANTIBIOTIC USE: "Are you taking antibiotics now or have you taken antibiotics in the past 2 months?" ?      No ?11. OTHER SYMPTOMS: "Do you have any other symptoms?" (e.g., fever, blood in stool) ?      No ?12. PREGNANCY: "Is there any chance you are pregnant?" "When was your last menstrual period?" ?      N/a ? ?Protocols used: Diarrhea-A-AH ? ?

## 2021-03-11 NOTE — Patient Instructions (Addendum)
For now I recommend the following to help you feel better: ? ?Stay well hydrated with water and you can use an electrolyte replacement such as Pedialyte to help ?You can try a bland diet to help settle your GI system until this has passed. ?You can use an imodium to help with the diarrhea- I recommend a half tablet to start off with rather than a full tablet or you can use Pepto Bismol as needed as well.  ?You can use your Albuterol inhaler every 4 hours as needed to help with your breathing ? ?We will tell you the results of your lab work and imaging as it becomes available.  ? ?If you start to feel worse or more dizzy please go to the ED so they can assist you with rehydration and stabilize you.  ? ?

## 2021-03-12 LAB — CBC WITH DIFFERENTIAL/PLATELET
Absolute Monocytes: 581 cells/uL (ref 200–950)
Basophils Absolute: 61 cells/uL (ref 0–200)
Basophils Relative: 1.2 %
Eosinophils Absolute: 112 cells/uL (ref 15–500)
Eosinophils Relative: 2.2 %
HCT: 34.3 % — ABNORMAL LOW (ref 38.5–50.0)
Hemoglobin: 11.4 g/dL — ABNORMAL LOW (ref 13.2–17.1)
Lymphs Abs: 1984 cells/uL (ref 850–3900)
MCH: 28.4 pg (ref 27.0–33.0)
MCHC: 33.2 g/dL (ref 32.0–36.0)
MCV: 85.3 fL (ref 80.0–100.0)
MPV: 11 fL (ref 7.5–12.5)
Monocytes Relative: 11.4 %
Neutro Abs: 2361 cells/uL (ref 1500–7800)
Neutrophils Relative %: 46.3 %
Platelets: 192 10*3/uL (ref 140–400)
RBC: 4.02 10*6/uL — ABNORMAL LOW (ref 4.20–5.80)
RDW: 13.2 % (ref 11.0–15.0)
Total Lymphocyte: 38.9 %
WBC: 5.1 10*3/uL (ref 3.8–10.8)

## 2021-03-12 LAB — COMPREHENSIVE METABOLIC PANEL
AG Ratio: 1.4 (calc) (ref 1.0–2.5)
ALT: 21 U/L (ref 9–46)
AST: 25 U/L (ref 10–35)
Albumin: 4.1 g/dL (ref 3.6–5.1)
Alkaline phosphatase (APISO): 66 U/L (ref 35–144)
BUN/Creatinine Ratio: 18 (calc) (ref 6–22)
BUN: 27 mg/dL — ABNORMAL HIGH (ref 7–25)
CO2: 29 mmol/L (ref 20–32)
Calcium: 9.8 mg/dL (ref 8.6–10.3)
Chloride: 100 mmol/L (ref 98–110)
Creat: 1.5 mg/dL — ABNORMAL HIGH (ref 0.70–1.28)
Globulin: 3 g/dL (calc) (ref 1.9–3.7)
Glucose, Bld: 96 mg/dL (ref 65–99)
Potassium: 4.1 mmol/L (ref 3.5–5.3)
Sodium: 139 mmol/L (ref 135–146)
Total Bilirubin: 0.5 mg/dL (ref 0.2–1.2)
Total Protein: 7.1 g/dL (ref 6.1–8.1)

## 2021-03-17 DIAGNOSIS — Z20828 Contact with and (suspected) exposure to other viral communicable diseases: Secondary | ICD-10-CM | POA: Diagnosis not present

## 2021-03-21 ENCOUNTER — Other Ambulatory Visit: Payer: Self-pay | Admitting: Family Medicine

## 2021-03-26 DIAGNOSIS — N1831 Chronic kidney disease, stage 3a: Secondary | ICD-10-CM | POA: Diagnosis not present

## 2021-03-26 DIAGNOSIS — D631 Anemia in chronic kidney disease: Secondary | ICD-10-CM | POA: Diagnosis not present

## 2021-03-26 DIAGNOSIS — E875 Hyperkalemia: Secondary | ICD-10-CM | POA: Diagnosis not present

## 2021-03-26 DIAGNOSIS — N2581 Secondary hyperparathyroidism of renal origin: Secondary | ICD-10-CM | POA: Diagnosis not present

## 2021-03-29 ENCOUNTER — Other Ambulatory Visit: Payer: Self-pay | Admitting: Family Medicine

## 2021-03-29 DIAGNOSIS — I700 Atherosclerosis of aorta: Secondary | ICD-10-CM

## 2021-03-30 ENCOUNTER — Other Ambulatory Visit: Payer: Self-pay | Admitting: Family Medicine

## 2021-03-30 DIAGNOSIS — N529 Male erectile dysfunction, unspecified: Secondary | ICD-10-CM

## 2021-04-08 ENCOUNTER — Other Ambulatory Visit: Payer: Self-pay | Admitting: Family Medicine

## 2021-04-08 DIAGNOSIS — R059 Cough, unspecified: Secondary | ICD-10-CM | POA: Diagnosis not present

## 2021-04-08 DIAGNOSIS — Z20822 Contact with and (suspected) exposure to covid-19: Secondary | ICD-10-CM | POA: Diagnosis not present

## 2021-04-08 DIAGNOSIS — R051 Acute cough: Secondary | ICD-10-CM | POA: Diagnosis not present

## 2021-04-08 DIAGNOSIS — N401 Enlarged prostate with lower urinary tract symptoms: Secondary | ICD-10-CM

## 2021-04-09 DIAGNOSIS — Z20822 Contact with and (suspected) exposure to covid-19: Secondary | ICD-10-CM | POA: Diagnosis not present

## 2021-04-11 DIAGNOSIS — Z20822 Contact with and (suspected) exposure to covid-19: Secondary | ICD-10-CM | POA: Diagnosis not present

## 2021-04-12 ENCOUNTER — Telehealth: Payer: Self-pay | Admitting: *Deleted

## 2021-04-12 NOTE — Chronic Care Management (AMB) (Signed)
?  Care Management  ? ?Note ? ?04/12/2021 ?Name: [name] MRN: [id number] DOB: 1950-05-07 ? ?[name] is a 71 y.o. year old male who is a primary care patient of Steele Sizer, MD. I reached out to [employer] by phone today offer care coordination services.  ? ?[name] was given information about care management services today including:  ?Care management services include personalized support from designated clinical staff supervised by his physician, including individualized plan of care and coordination with other care providers ?24/7 contact phone numbers for assistance for urgent and routine care needs. ?The patient may stop care management services at any time by phone call to the office staff. ? ?Patient did not agree to enrollment in care management services and does not wish to consider at this time. ? ?Follow up plan: ?Patient declines further follow up and engagement by the care management team.  ? ?Alara Daniel, CCMA ?Care Guide, Embedded Care Coordination ?[place]  Care Management  ?Direct Dial: [phone] ? ? ?

## 2021-04-26 ENCOUNTER — Other Ambulatory Visit: Payer: Self-pay | Admitting: Family Medicine

## 2021-04-26 DIAGNOSIS — N529 Male erectile dysfunction, unspecified: Secondary | ICD-10-CM

## 2021-05-08 ENCOUNTER — Telehealth: Payer: Self-pay

## 2021-05-08 ENCOUNTER — Telehealth: Payer: Self-pay | Admitting: Family Medicine

## 2021-05-08 NOTE — Telephone Encounter (Signed)
Copied from [place]. Topic: General - Inquiry ?>> May 08, 2021  3:19 PM Loma [place] wrote: ?Reason for CRM: Pt wife calling in regards to her husband getting the yellow fever vaccine. [facility] Dept has to have a form filled out  stating that pt is healthy enough due to age to receive vaccine. Pt has not seen Dr Ancil Boozer since 8/1 with 2 sick visits with other providers. I suggested an appointment 1st and  gave pt a 5/9 appt. Pt wants the form filled out  now prior to the appt so he can go ahead and get the vaccine before being seen.  Pt is going to [place] and is too busy to make two appts or wait till after the 9th for vaccine. Pls fu with pt to verify that they can have a medical clearance due to age that he can have the vaccine without visit. Advise pt at [phone] ?

## 2021-05-08 NOTE — Telephone Encounter (Signed)
Copied from [place] [phone]. Topic: Medicare AWV ?>> May 08, 2021  2:03 PM Cher Nakai R wrote: ?Reason for CRM:  ?Left message for patient to call back and schedule Medicare Annual Wellness Visit (AWV) in office.  ? ?If unable to come into the office for AWV,  please offer to do virtually or by telephone. ? ?Last AWV: 09/06/2019 ? ?Please schedule at anytime with Stella. ? ?30 minute appointment for Virtual or phone ?45 minute appointment for in office or Initial virtual/phone ? ?Any questions, please contact me at [phone] ?

## 2021-05-08 NOTE — Telephone Encounter (Signed)
Spoke to patient and wife. Reviewed risks and benefits of the yellow fever vaccine. They gave verbal understanding of potential side affects. Per [employer] we will write a letter stating he can proceed with the vaccine as requested, but patient is aware we cannot say he is completely risk free. Both patient/wife gave verbal understanding. Letter written and faxed to [facility] H.D. (Fx: [phone]) ?

## 2021-05-11 ENCOUNTER — Encounter: Payer: Self-pay | Admitting: Family Medicine

## 2021-05-13 NOTE — Progress Notes (Signed)
Name: [name]   MRN: [id number]    DOB: Feb 19, 1950   Date:05/14/2021 ? ?     Progress Note ? ?Subjective ? ?Chief Complaint ? ?Follow Up ? ?HPI ? ?Morbid Obese: he was up to 303 lbs May 2020 and has been fluctuating since, but last visit with me 08/2020 it was 298 lbs and today is down to 289 lbs, he has not been physically active lately but has been eating smaller portions.  ? ?Fatigue: he states he goes to bed late, wakes up around 4:30 and when he arrives home from work he takes a nap on the sofa . Discussed sleep hygiene, he will try to go to the gym after work to be able to stay up.  ? ?Atherosclerosis of aorta: on Eliquis and atorvastatin , last LDL was a little higher than previously and discussed rechecking it  ? ?Asthma Moderate: he is using Trelegy and singulair daily, no longer using rescue inhaler daily He states symptoms are controlled, no cough, wheezing or SOB ?  ?History of anemia: but seen by hematologist level back in Nov 2019 and Hgb  was  11.5 . Had blood transfusion after his left hip replacement surgery Dec 2020. His HCT has been stable, iron storage back to normal , he now has anemia of chronic disease  ?  ?CKI stage III: under the care of Dr. Holley Raring. No pruritus. 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 Anemia of chronic disease is stable. Parathyroid hormone level back to normal   ?  ?Dyslipidemia: he is not sure if he is taking Atorvastatin, last LDL was 100 again up from 79. We will recheck and if still above goal we will adjust dose of medication ?  ?Chronic DVT: on Eliquis, no side effects of medication , no easy bruising. No leg pain or swelling with ambulation.  ?  ?GERD and eructation: he is doing well on Pepcid  ? ?BPH/ED:  He is taking medications, Cialis 5 mg was not working for ED, we added 10 mg but it made him flush so we will change to 5 mg to take one daily and add extra 5 mg every 3  days prn intercourse and takes Flomax .  ? ?AR: he has been taking singulair, Allegra 180 mg daily , he is no longer taking Allegra D. He is wearing a mask due to high pollen count  ? ?Travel abroad: he is going to [place] for two weeks in June, discussed travel medicine to be able to get appropriate vaccines and medications.  ?  ?Patient Active Problem List  ? Diagnosis Date Noted  ? Secondary hyperparathyroidism ([place]) 08/06/2020  ? 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 Osteoarthritis, multiple sites 06/16/2019  ? Morbid obesity ([place]) 06/16/2019  ? Chronic deep vein thrombosis (DVT) of calf muscle vein of left lower extremity ([place]) 11/30/2017  ? Atherosclerosis of abdominal aorta ([place]) 09/15/2017  ? Chronic deep vein thrombosis (DVT) of left popliteal vein ([place]) 06/26/2017  ? History of benign neoplasm of adrenal gland 02/11/2016  ? History of iron deficiency anemia 10/10/2015  ? BPH (benign prostatic hyperplasia) 06/20/2015  ? ED (erectile dysfunction) 06/20/2015  ? Allergic rhinitis, seasonal 06/20/2015  ? Anemia of chronic disease 06/20/2015  ? Hypogonadism in male 06/20/2015  ? Asthma, well controlled, moderate persistent 06/20/2015  ? Hypertension, benign 06/20/2015  ? Hyperglycemia 06/20/2015  ? History of shingles 06/20/2015  ?  GERD without esophagitis 06/20/2015  ? Chronic radicular low back pain 06/20/2015  ? History of epilepsy 06/20/2015  ? Migraine without aura and without status migrainosus, not intractable 06/20/2015  ? Dyslipidemia 06/20/2015  ? Primary osteoarthritis of both knees 06/20/2015  ? ? ?Past Surgical History:  ?Procedure Laterality Date  ? ADRENALECTOMY Left 02/11/2016  ? [facility]  ? COLONOSCOPY  02/2012  ? normal  ? JOINT REPLACEMENT    ? KNEE ARTHROSCOPY Left 10/06/2009  ? SINUS EXPLORATION    ? TOTAL HIP ARTHROPLASTY Left 12/14/2017  ? Procedure: TOTAL HIP ARTHROPLASTY ANTERIOR APPROACH;  Surgeon: Dorna Leitz, MD;  Location: [facility];  Service: Orthopedics;  Laterality: Left;  ? TOTAL KNEE  ARTHROPLASTY Right 06/20/2016  ? TOTAL KNEE ARTHROPLASTY Right 06/20/2016  ? Procedure: TOTAL KNEE ARTHROPLASTY;  Surgeon: Dorna Leitz, MD;  Location: [facility];  Service: Orthopedics;  Laterality: Right;  ? TOTAL KNEE ARTHROPLASTY Left 12/19/2016  ? Procedure: LEFT TOTAL KNEE ARTHROPLASTY;  Surgeon: Dorna Leitz, MD;  Location: [facility];  Service: Orthopedics;  Laterality: Left;  Adductor Block  ? ? ?Family History  ?Problem Relation Age of Onset  ? Diabetes Mother   ? Heart disease Mother   ? Lung disease Mother   ? Seizures Maternal Grandmother   ? Alzheimer's disease Brother   ? ? ?Social History  ? ?Tobacco Use  ? Smoking status: Former  ?  Packs/day: 1.00  ?  Years: 10.00  ?  Pack years: 10.00  ?  Types: Cigarettes  ?  Quit date: 74  ?  Years since quitting: 43.3  ? Smokeless tobacco: Never  ? Tobacco comments:  ?  38 years ago 75 when he stopped  ?Substance Use Topics  ? Alcohol use: No  ?  Alcohol/week: 0.0 standard drinks  ? ? ? ?Current Outpatient Medications:  ?  albuterol (VENTOLIN HFA) 108 (90 Base) MCG/ACT inhaler, INHALE 2 PUFFS BY MOUTH EVERY 4 HOURS AS NEEDED FOR WHEEZE OR FOR SHORTNESS OF BREATH, Disp: 8.5 each, Rfl: 0 ?  atorvastatin (LIPITOR) 40 MG tablet, TAKE 1 TABLET BY MOUTH EVERY DAY, Disp: 90 tablet, Rfl: 1 ?  azelastine (OPTIVAR) 0.05 % ophthalmic solution, PLACE 2 DROPS INTO BOTH EYES 2 (TWO) TIMES DAILY., Disp: 18 mL, Rfl: 1 ?  benzonatate (TESSALON) 100 MG capsule, Take 1 capsule (100 mg total) by mouth 2 (two) times daily as needed for cough., Disp: 40 capsule, Rfl: 0 ?  ELIQUIS 5 MG TABS tablet, TAKE 1 TABLET BY MOUTH TWICE A DAY, Disp: 60 tablet, Rfl: 1 ?  famotidine (PEPCID) 20 MG tablet, TAKE 1 TABLET BY MOUTH TWICE A DAY, Disp: 180 tablet, Rfl: 1 ?  fluticasone (FLONASE) 50 MCG/ACT nasal spray, Place 2 sprays into both nostrils daily., Disp: 48 mL, Rfl: 1 ?  Fluticasone-Umeclidin-Vilant (TRELEGY ELLIPTA) 100-62.5-25 MCG/ACT AEPB, Inhale 1 puff into the lungs daily., Disp: 1 each, Rfl:  5 ?  montelukast (SINGULAIR) 10 MG tablet, TAKE 1 TABLET BY MOUTH EVERY DAY, Disp: 90 tablet, Rfl: 1 ?  Multiple Vitamins-Minerals (MULTIVITAMIN ADULTS 50+ PO), Take 1 tablet by mouth daily. NATURE'S CODE MEN OVER 50 MULTIVITAMIN PACK, Disp: , Rfl:  ?  Polyethyl Glycol-Propyl Glycol 0.4-0.3 % SOLN, Place 1-2 drops into both eyes 3 (three) times daily as needed (for dry/irritated eyes.)., Disp: , Rfl:  ?  tadalafil (CIALIS) 5 MG tablet, TAKE 1 TABLET BY MOUTH ONCE DAILY. MAY TAKE TWO AS NEED FOR INTERCOURSE, Disp: 90 tablet, Rfl: 0 ?  tamsulosin (FLOMAX) 0.4  MG CAPS capsule, TAKE 1 CAPSULE BY MOUTH EVERY EVENING, Disp: 90 capsule, Rfl: 1 ?  Tapinarof (VTAMA) 1 % CREA, Apply 1 application topically in the morning and at bedtime. To affected areas at palms, Disp: 60 g, Rfl: 1 ?  tiZANidine (ZANAFLEX) 2 MG tablet, Take 1 tablet (2 mg total) by mouth 2 (two) times daily., Disp: 180 tablet, Rfl: 1 ?  VELTASSA 8.4 g packet, Take 1 packet by mouth daily., Disp: , Rfl:  ?  vitamin B-12 (CYANOCOBALAMIN) 500 MCG tablet, Take 500 mcg by mouth daily., Disp: , Rfl:  ? ?Allergies  ?Allergen Reactions  ? Almond (Diagnostic) Other (See Comments)  ?  Migraines  ? Lactose Intolerance (Gi) Other (See Comments)  ?  MIGRAINES  ? Peanut-Containing Drug Products Other (See Comments)  ?  Migraines  ? Shellfish Allergy Other (See Comments)  ?  Congestion/breathing problems/migraines.  ? ? ?I personally reviewed active problem list, medication list, allergies, family history, social history, health maintenance with the patient/caregiver today. ? ? ?ROS ? ?Constitutional: Negative for fever or weight change.  ?Respiratory: Negative for cough and shortness of breath.   ?Cardiovascular: Negative for chest pain or palpitations.  ?Gastrointestinal: Negative for abdominal pain, no bowel changes.  ?Musculoskeletal: Negative for gait problem or joint swelling.  ?Skin: Negative for rash.  ?Neurological: Negative for dizziness or headache.  ?No other  specific complaints in a complete review of systems (except as listed in HPI above).  ? ?Objective ? ?Vitals:  ? 05/14/21 1435  ?BP: 114/64  ?Pulse: 90  ?Resp: 16  ?SpO2: 96%  ?Weight: 289 lb (131.1 kg

## 2021-05-14 ENCOUNTER — Ambulatory Visit (INDEPENDENT_AMBULATORY_CARE_PROVIDER_SITE_OTHER): Payer: Medicare Other | Admitting: Family Medicine

## 2021-05-14 ENCOUNTER — Encounter: Payer: Self-pay | Admitting: Family Medicine

## 2021-05-14 VITALS — BP 114/64 | HR 90 | Resp 16 | Ht 74.0 in | Wt 289.0 lb

## 2021-05-14 DIAGNOSIS — N2581 Secondary hyperparathyroidism of renal origin: Secondary | ICD-10-CM | POA: Diagnosis not present

## 2021-05-14 DIAGNOSIS — I700 Atherosclerosis of aorta: Secondary | ICD-10-CM

## 2021-05-14 DIAGNOSIS — J301 Allergic rhinitis due to pollen: Secondary | ICD-10-CM | POA: Diagnosis not present

## 2021-05-14 DIAGNOSIS — E896 Postprocedural adrenocortical (-medullary) hypofunction: Secondary | ICD-10-CM

## 2021-05-14 DIAGNOSIS — E785 Hyperlipidemia, unspecified: Secondary | ICD-10-CM

## 2021-05-14 DIAGNOSIS — I82562 Chronic embolism and thrombosis of left calf muscular vein: Secondary | ICD-10-CM | POA: Diagnosis not present

## 2021-05-14 DIAGNOSIS — N529 Male erectile dysfunction, unspecified: Secondary | ICD-10-CM | POA: Diagnosis not present

## 2021-05-14 DIAGNOSIS — Z862 Personal history of diseases of the blood and blood-forming organs and certain disorders involving the immune mechanism: Secondary | ICD-10-CM

## 2021-05-14 DIAGNOSIS — Z789 Other specified health status: Secondary | ICD-10-CM

## 2021-05-14 DIAGNOSIS — K219 Gastro-esophageal reflux disease without esophagitis: Secondary | ICD-10-CM

## 2021-05-14 DIAGNOSIS — M545 Low back pain, unspecified: Secondary | ICD-10-CM | POA: Diagnosis not present

## 2021-05-14 DIAGNOSIS — N1831 Chronic kidney disease, stage 3a: Secondary | ICD-10-CM | POA: Diagnosis not present

## 2021-05-14 DIAGNOSIS — J3089 Other allergic rhinitis: Secondary | ICD-10-CM

## 2021-05-14 DIAGNOSIS — I82532 Chronic embolism and thrombosis of left popliteal vein: Secondary | ICD-10-CM

## 2021-05-14 DIAGNOSIS — D638 Anemia in other chronic diseases classified elsewhere: Secondary | ICD-10-CM | POA: Diagnosis not present

## 2021-05-14 DIAGNOSIS — J302 Other seasonal allergic rhinitis: Secondary | ICD-10-CM

## 2021-05-14 MED ORDER — APIXABAN 5 MG PO TABS: 5.0000 mg | ORAL_TABLET | Freq: Two times a day (BID) | ORAL | 5 refills | Status: DC

## 2021-05-14 MED ORDER — TIZANIDINE HCL 2 MG PO TABS: 2.0000 mg | ORAL_TABLET | Freq: Two times a day (BID) | ORAL | 1 refills | Status: DC

## 2021-05-14 MED ORDER — FLUTICASONE PROPIONATE 50 MCG/ACT NA SUSP: 2.0000 | Freq: Every day | NASAL | 1 refills | Status: DC

## 2021-05-14 MED ORDER — CIPROFLOXACIN HCL 250 MG PO TABS: 250.0000 mg | ORAL_TABLET | Freq: Two times a day (BID) | ORAL | 0 refills | Status: AC

## 2021-05-14 MED ORDER — TADALAFIL 5 MG PO TABS: 5.0000 mg | ORAL_TABLET | Freq: Every day | ORAL | 0 refills | Status: DC | PRN

## 2021-05-14 NOTE — Assessment & Plan Note (Signed)
BMI above 35 with co-morbidities, losing weight  ?

## 2021-05-14 NOTE — Assessment & Plan Note (Signed)
bp back to normal since surgery  ?

## 2021-05-14 NOTE — Assessment & Plan Note (Signed)
However last levels likely from anemia of chronic disease ?

## 2021-05-14 NOTE — Assessment & Plan Note (Addendum)
He is under the care of Dr. Holley Raring - last Pth had improved, urine micro normal  ?GFR up to 51  ?

## 2021-05-14 NOTE — Assessment & Plan Note (Signed)
Going on a long flight to [place], discussed importance of taking Eliquis as prescribed and try to walk inside the aircraft ?

## 2021-05-14 NOTE — Assessment & Plan Note (Signed)
On statin therapy 

## 2021-05-14 NOTE — Assessment & Plan Note (Signed)
On Atorvastatin 

## 2021-05-28 DIAGNOSIS — Z96642 Presence of left artificial hip joint: Secondary | ICD-10-CM | POA: Diagnosis not present

## 2021-05-28 DIAGNOSIS — M25551 Pain in right hip: Secondary | ICD-10-CM | POA: Diagnosis not present

## 2021-06-12 DIAGNOSIS — Z23 Encounter for immunization: Secondary | ICD-10-CM | POA: Diagnosis not present

## 2021-06-13 ENCOUNTER — Other Ambulatory Visit: Payer: Self-pay

## 2021-06-13 ENCOUNTER — Encounter: Payer: Self-pay | Admitting: Family Medicine

## 2021-06-13 ENCOUNTER — Telehealth: Payer: Self-pay | Admitting: Family Medicine

## 2021-06-13 DIAGNOSIS — Z789 Other specified health status: Secondary | ICD-10-CM

## 2021-06-13 MED ORDER — ATOVAQUONE-PROGUANIL HCL 250-100 MG PO TABS: ORAL_TABLET | ORAL | 0 refills | Status: DC

## 2021-06-13 NOTE — Telephone Encounter (Signed)
Already sent to pharmacy today in another encounter.

## 2021-06-13 NOTE — Telephone Encounter (Signed)
Medication Refill - Medication:  (generic) Atovaquone/Proguanil 250/'100mg'$  20 tablets  Has the patient contacted their pharmacy? Yes.   Contact PCP  *For an [place] trip*  Preferred Pharmacy (with phone number or street name):  [id number] - [place], [place] Phone:  [phone]  Fax:  [phone]      Has the patient been seen for an appointment in the last year OR does the patient have an upcoming appointment? Yes.    Agent: Please be advised that RX refills may take up to 3 business days. We ask that you follow-up with your pharmacy.

## 2021-07-10 ENCOUNTER — Other Ambulatory Visit: Payer: Self-pay | Admitting: Family Medicine

## 2021-07-10 DIAGNOSIS — J4540 Moderate persistent asthma, uncomplicated: Secondary | ICD-10-CM

## 2021-07-24 ENCOUNTER — Other Ambulatory Visit: Payer: Self-pay | Admitting: Family Medicine

## 2021-07-24 DIAGNOSIS — N529 Male erectile dysfunction, unspecified: Secondary | ICD-10-CM

## 2021-08-01 ENCOUNTER — Other Ambulatory Visit: Payer: Self-pay | Admitting: Family Medicine

## 2021-08-01 DIAGNOSIS — J4540 Moderate persistent asthma, uncomplicated: Secondary | ICD-10-CM

## 2021-08-21 ENCOUNTER — Other Ambulatory Visit: Payer: Self-pay | Admitting: Family Medicine

## 2021-08-21 DIAGNOSIS — J4540 Moderate persistent asthma, uncomplicated: Secondary | ICD-10-CM

## 2021-09-08 ENCOUNTER — Other Ambulatory Visit: Payer: Self-pay | Admitting: Family Medicine

## 2021-09-08 DIAGNOSIS — K219 Gastro-esophageal reflux disease without esophagitis: Secondary | ICD-10-CM

## 2021-09-11 ENCOUNTER — Other Ambulatory Visit: Payer: Self-pay | Admitting: Family Medicine

## 2021-09-11 DIAGNOSIS — I700 Atherosclerosis of aorta: Secondary | ICD-10-CM

## 2021-09-18 NOTE — Progress Notes (Signed)
Name: [name]   MRN: [id number]    DOB: [birth year]   Date:09/19/2021       Progress Note  Subjective  Chief Complaint  Annual Exam  HPI  Patient presents for annual CPE.  IPSS Questionnaire (AUA-7): Over the past month.   1)  How often have you had a sensation of not emptying your bladder completely after you finish urinating?  0 - Not at all  2)  How often have you had to urinate again less than two hours after you finished urinating? 0 - Not at all  3)  How often have you found you stopped and started again several times when you urinated?  0 - Not at all  4) How difficult have you found it to postpone urination?  0 - Not at all  5) How often have you had a weak urinary stream?  0 - Not at all  6) How often have you had to push or strain to begin urination?  0 - Not at all  7) How many times did you most typically get up to urinate from the time you went to bed until the time you got up in the morning?  4 - 4 times  Total score:  0-7 mildly symptomatic   8-19 moderately symptomatic   20-35 severely symptomatic     Diet: eating out a lot, discussed healthier diet  Exercise: discussed regular physical exam  Last Dental Exam:every 6 months  Last Eye Exam: every other year   Depression: phq 9 is negative    09/19/2021    8:47 AM 05/14/2021    2:35 PM 03/11/2021   12:48 PM 12/05/2020    1:27 PM 08/28/2020   11:26 AM  Depression screen PHQ 2/9  Decreased Interest 0 0 0 0 0  Down, Depressed, Hopeless 0 0 0 0 0  PHQ - 2 Score 0 0 0 0 0  Altered sleeping 0 0 0 0   Tired, decreased energy 0 0 0 0   Change in appetite 0 0 0 0   Feeling bad or failure about yourself  0 0 0 0   Trouble concentrating 0 0 0 0   Moving slowly or fidgety/restless 0 0 0 0   Suicidal thoughts 0 0 0 0   PHQ-9 Score 0 0 0 0   Difficult doing work/chores   Not difficult at all Not difficult at all     Hypertension:  BP Readings from Last 3 Encounters:  09/19/21 128/74  05/14/21 114/64   03/11/21 110/66    Obesity: Wt Readings from Last 3 Encounters:  09/19/21 298 lb (135.2 kg)  05/14/21 289 lb (131.1 kg)  03/11/21 286 lb (129.7 kg)   BMI Readings from Last 3 Encounters:  09/19/21 38.26 kg/m  05/14/21 37.11 kg/m  03/11/21 36.72 kg/m     Lipids:  Lab Results  Component Value Date   CHOL 164 08/06/2020   CHOL 143 06/16/2019   CHOL 178 09/15/2017   Lab Results  Component Value Date   HDL 42 08/06/2020   HDL 44 06/16/2019   HDL 44 09/15/2017   Lab Results  Component Value Date   LDLCALC 100 (H) 08/06/2020   LDLCALC 79 06/16/2019   LDLCALC 108 (H) 09/15/2017   Lab Results  Component Value Date   TRIG 128 08/06/2020   TRIG 118 06/16/2019   TRIG 151 (H) 09/15/2017   Lab Results  Component Value Date   CHOLHDL 3.9 08/06/2020  CHOLHDL 3.3 06/16/2019   CHOLHDL 4.0 09/15/2017   No results found for: "LDLDIRECT" Glucose:  Glucose, Bld  Date Value Ref Range Status  03/11/2021 96 65 - 99 mg/dL Final    Comment:    .            Fasting reference interval .   08/06/2020 95 65 - 99 mg/dL Final    Comment:    .            Fasting reference interval .   06/16/2019 126 (H) 65 - 99 mg/dL Final    Comment:    .            Fasting reference interval . For someone without known diabetes, a glucose value >125 mg/dL indicates that they may have diabetes and this should be confirmed with a follow-up test. .    Glucose-Capillary  Date Value Ref Range Status  12/16/2017 81 70 - 99 mg/dL Final  12/16/2017 125 (H) 70 - 99 mg/dL Final  12/15/2017 140 (H) 70 - 99 mg/dL Final    [place] [facility]  AUDIT-C Score 0      Married STD testing and prevention (HIV/chl/gon/syphilis): N/A Sexual history: uses Cialis, one partner  Hep C Screening: 06/20/15 Skin cancer: Discussed monitoring for atypical lesions Colorectal cancer: 02/15/12 Prostate cancer:   Lab Results  Component Value  Date   PSA 2.26 08/06/2020   PSA 2.71 12/16/2019   PSA 3.4 06/16/2019     Lung cancer:  Low Dose CT Chest recommended if Age 64-80 years, 30 pack-year currently smoking OR have quit w/in 15years. Patient  no a candidate for screening   AAA: had CT abdomen, no aneurysm ECG:  09/28/20  Vaccines:   HPV: N/A Tdap: up to date Shingrix: up to date Pneumonia: up to date Flu: due COVID-19: discussed booster RSV: due   Advanced Care Planning: A voluntary discussion about advance care planning including the explanation and discussion of advance directives.  Discussed health care proxy and Living will, and the patient was able to identify a health care proxy as wife .  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[place]) 08/06/2020   Osteoarthritis, multiple sites 06/16/2019   Morbid obesity ([place]) 06/16/2019   Atherosclerosis of abdominal aorta ([place]) 09/15/2017   Chronic deep vein thrombosis (DVT) of left popliteal vein ([place]) 06/26/2017   History of benign neoplasm of adrenal gland 02/11/2016   History of iron deficiency anemia 10/10/2015   BPH (benign prostatic hyperplasia) 06/20/2015   ED (erectile dysfunction) 06/20/2015   Allergic rhinitis, seasonal 06/20/2015   Anemia of chronic disease 06/20/2015   Hypogonadism in male 06/20/2015   Asthma, well controlled, moderate persistent 06/20/2015   Hyperglycemia 06/20/2015   History of shingles 06/20/2015   GERD without esophagitis 06/20/2015   Chronic radicular low back pain 06/20/2015   History of epilepsy 06/20/2015   Migraine without aura and without status migrainosus, not intractable 06/20/2015   Dyslipidemia 06/20/2015   Primary osteoarthritis of both knees 06/20/2015    Past Surgical History:  Procedure Laterality Date   ADRENALECTOMY Left 02/11/2016   [facility]   COLONOSCOPY   02/2012   normal   JOINT REPLACEMENT     KNEE ARTHROSCOPY Left 10/06/2009   SINUS EXPLORATION  TOTAL HIP ARTHROPLASTY Left 12/14/2017   Procedure: TOTAL HIP ARTHROPLASTY ANTERIOR APPROACH;  Surgeon: Dorna Leitz, MD;  Location: [facility];  Service: Orthopedics;  Laterality: Left;   TOTAL KNEE ARTHROPLASTY Right 06/20/2016   TOTAL KNEE ARTHROPLASTY Right 06/20/2016   Procedure: TOTAL KNEE ARTHROPLASTY;  Surgeon: Dorna Leitz, MD;  Location: [facility];  Service: Orthopedics;  Laterality: Right;   TOTAL KNEE ARTHROPLASTY Left 12/19/2016   Procedure: LEFT TOTAL KNEE ARTHROPLASTY;  Surgeon: Dorna Leitz, MD;  Location: [facility];  Service: Orthopedics;  Laterality: Left;  Adductor Block    Family History  Problem Relation Age of Onset   Diabetes Mother    Heart disease Mother    Lung disease Mother    Seizures Maternal Grandmother    Alzheimer's disease Brother     Social History   Socioeconomic History   Marital status: Married    Spouse name: deborah   Number of children: 2   Years of education: Not on file   Highest education level: Professional school degree (e.g., MD, DDS, DVM, JD)  Occupational History   Occupation: [employer]  Tobacco Use   Smoking status: Former    Packs/day: 1.00    Years: 10.00    Total pack years: 10.00    Types: Cigarettes    Quit date: 1980    Years since quitting: 43.7   Smokeless tobacco: Never   Tobacco comments:    38 years ago 72 when he stopped  Vaping Use   Vaping Use: Never used  Substance and Sexual Activity   Alcohol use: No    Alcohol/week: 0.0 standard drinks of alcohol   Drug use: No   Sexual activity: Yes    Partners: Female  Other Topics Concern   Not on file  Social History Narrative   Patient has 8 grands   Social Determinants of Health   Financial Resource Strain: Low Risk  (09/19/2021)   Overall Financial Resource Strain (CARDIA)    Difficulty of Paying Living Expenses: Not hard at all  Food Insecurity: No Food  Insecurity (09/19/2021)   Hunger Vital Sign    Worried About Running Out of Food in the Last Year: Never true    [place] in the Last Year: Never true  Transportation Needs: No Transportation Needs (09/19/2021)   PRAPARE - Hydrologist (Medical): No    Lack of Transportation (Non-Medical): No  Physical Activity: Insufficiently Active (09/19/2021)   Exercise Vital Sign    Days of Exercise per Week: 2 days    Minutes of Exercise per Session: 30 min  Stress: No Stress Concern Present (09/19/2021)   [place]    Feeling of Stress : Not at all  Social Connections: [place] (09/19/2021)   Social Connection and Isolation Panel [NHANES]    Frequency of Communication with Friends and Family: Three times a week    Frequency of Social Gatherings with Friends and Family: Once a week    Attends Religious Services: More than 4 times per year    Active Member of [employer] or Organizations: Yes    Attends Archivist Meetings: 1 to 4 times per year    Marital Status: Married  Human resources officer Violence: Not At Risk (09/19/2021)   Humiliation, Afraid, Rape, and Kick questionnaire    Fear of Current or Ex-Partner: No    Emotionally Abused: No    Physically Abused: No  Sexually Abused: No     Current Outpatient Medications:    albuterol (VENTOLIN HFA) 108 (90 Base) MCG/ACT inhaler, INHALE 2 PUFFS BY MOUTH EVERY 4 HOURS AS NEEDED FOR WHEEZE OR FOR SHORTNESS OF BREATH, Disp: 8.5 each, Rfl: 0   apixaban (ELIQUIS) 5 MG TABS tablet, Take 1 tablet (5 mg total) by mouth 2 (two) times daily., Disp: 60 tablet, Rfl: 5   atorvastatin (LIPITOR) 40 MG tablet, TAKE 1 TABLET BY MOUTH EVERY DAY, Disp: 90 tablet, Rfl: 1   azelastine (OPTIVAR) 0.05 % ophthalmic solution, PLACE 2 DROPS INTO BOTH EYES 2 (TWO) TIMES DAILY., Disp: 18 mL, Rfl: 1   famotidine (PEPCID) 20 MG tablet, TAKE 1 TABLET BY MOUTH TWICE  A DAY, Disp: 180 tablet, Rfl: 1   fluticasone (FLONASE) 50 MCG/ACT nasal spray, Place 2 sprays into both nostrils daily., Disp: 48 mL, Rfl: 1   Fluticasone-Umeclidin-Vilant (TRELEGY ELLIPTA) 100-62.5-25 MCG/ACT AEPB, Inhale 1 puff into the lungs daily., Disp: 1 each, Rfl: 5   montelukast (SINGULAIR) 10 MG tablet, TAKE 1 TABLET BY MOUTH EVERY DAY, Disp: 90 tablet, Rfl: 1   Multiple Vitamins-Minerals (MULTIVITAMIN ADULTS 50+ PO), Take 1 tablet by mouth daily. NATURE'S CODE MEN OVER 50 MULTIVITAMIN PACK, Disp: , Rfl:    Polyethyl Glycol-Propyl Glycol 0.4-0.3 % SOLN, Place 1-2 drops into both eyes 3 (three) times daily as needed (for dry/irritated eyes.)., Disp: , Rfl:    tadalafil (CIALIS) 5 MG tablet, TAKE 1 TABLET BY MOUTH ONCE DAILY .  MAY  TAKE  2  AS  NEEDED  FOR  INTERCOURSE, Disp: 90 tablet, Rfl: 0   tamsulosin (FLOMAX) 0.4 MG CAPS capsule, TAKE 1 CAPSULE BY MOUTH EVERY EVENING, Disp: 90 capsule, Rfl: 1   tiZANidine (ZANAFLEX) 2 MG tablet, Take 1 tablet (2 mg total) by mouth 2 (two) times daily., Disp: 180 tablet, Rfl: 1   VELTASSA 8.4 g packet, Take 1 packet by mouth daily., Disp: , Rfl:    vitamin B-12 (CYANOCOBALAMIN) 500 MCG tablet, Take 500 mcg by mouth daily., Disp: , Rfl:    Tapinarof (VTAMA) 1 % CREA, Apply 1 application topically in the morning and at bedtime. To affected areas at palms (Patient not taking: Reported on 09/19/2021), Disp: 60 g, Rfl: 1  Allergies  Allergen Reactions   Almond (Diagnostic) Other (See Comments)    Migraines   Lactose Intolerance (Gi) Other (See Comments)    MIGRAINES   Peanut-Containing Drug Products Other (See Comments)    Migraines   Shellfish Allergy Other (See Comments)    Congestion/breathing problems/migraines.     ROS  Constitutional: Negative for fever , positive for weight change.  Respiratory: positive  for cough but no shortness of breath.   Cardiovascular: Negative for chest pain or palpitations.  Gastrointestinal: Negative for  abdominal pain, no bowel changes.  Musculoskeletal: Negative for gait problem or joint swelling.  Skin: Negative for rash.  Neurological: Negative for dizziness or headache.  No other specific complaints in a complete review of systems (except as listed in HPI above).    Objective  Vitals:   09/19/21 0900  BP: 128/74  Pulse: 78  Resp: 16  SpO2: 97%  Weight: 298 lb (135.2 kg)  Height: '6\' 2"'$  (1.88 m)    Body mass index is 38.26 kg/m.  Physical Exam  Constitutional: Patient appears well-developed and well-nourished. No distress.  HENT: Head: Normocephalic and atraumatic. Ears: B TMs ok, no erythema or effusion; Nose: Nose normal. Mouth/Throat: Oropharynx is clear and moist.  No oropharyngeal exudate.  Eyes: Conjunctivae and EOM are normal. Pupils are equal, round, and reactive to light. No scleral icterus.  Neck: Normal range of motion. Neck supple. No JVD present. No thyromegaly present.  Cardiovascular: Normal rate, regular rhythm and normal heart sounds.  No murmur heard. No BLE edema. Pulmonary/Chest: Effort normal and breath sounds normal. No respiratory distress. Abdominal: Soft. Bowel sounds are normal, no distension. There is no tenderness. no masses MALE GENITALIA: Normal descended testes bilaterally, no masses palpated, no hernias, no lesions, no discharge RECTAL: Prostate slightly enlarged , no rectal masses or hemorrhoids  Musculoskeletal: Normal range of motion, no joint effusions. No gross deformities Neurological: he is alert and oriented to person, place, and time. No cranial nerve deficit. Coordination, balance, strength, speech and gait are normal.  Skin: Skin is warm and dry. No rash noted. No erythema. He has an atypical lesion on right upper back that he will return for a shave biopsy  Psychiatric: Patient has a normal mood and affect. behavior is normal. Judgment and thought content normal.   Fall Risk:    09/19/2021    8:47 AM 05/14/2021    2:35 PM 03/11/2021    12:48 PM 12/05/2020    1:27 PM 08/28/2020   11:25 AM  Fall Risk   Falls in the past year? 0 0 0 0 0  Number falls in past yr: 0 0 0 0 0  Injury with Fall? 0 0 0 0 0  Risk for fall due to : No Fall Risks No Fall Risks No Fall Risks  No Fall Risks  Follow up Falls prevention discussed Falls prevention discussed Falls prevention discussed  Falls prevention discussed     Functional Status Survey: Is the patient deaf or have difficulty hearing?: Yes Does the patient have difficulty seeing, even when wearing glasses/contacts?: No Does the patient have difficulty concentrating, remembering, or making decisions?: No Does the patient have difficulty walking or climbing stairs?: No Does the patient have difficulty dressing or bathing?: No Does the patient have difficulty doing errands alone such as visiting a doctor's office or shopping?: No    Assessment & Plan  1. Well adult exam  - Lipid panel - CBC with Differential/Platelet - COMPLETE METABOLIC PANEL WITH GFR - VITAMIN D 25 Hydroxy (Vit-D Deficiency, Fractures) - PSA  2. Need for immunization against influenza  - Flu Vaccine QUAD High Dose(Fluad)  3. Colon cancer screening  - Ambulatory referral to Gastroenterology  4. Chronic kidney disease, stage 3a ([place])   5. Atherosclerosis of abdominal aorta ([facility])  - COMPLETE METABOLIC PANEL WITH GFR - VITAMIN D 25 Hydroxy (Vit-D Deficiency, Fractures)  6. Anemia of chronic disease  - CBC with Differential/Platelet  7. Dyslipidemia  - Lipid panel  8. Benign prostatic hyperplasia with urinary obstruction  - PSA  9. Secondary hyperparathyroidism ([place])  - VITAMIN D 25 Hydroxy (Vit-D Deficiency, Fractures) - Parathyroid hormone, intact (no Ca)     -Prostate cancer screening and PSA options (with potential risks and benefits of testing vs not testing) were discussed along with recent recs/guidelines. -USPSTF grade A and B recommendations reviewed with patient;  age-appropriate recommendations, preventive care, screening tests, etc discussed and encouraged; healthy living encouraged; see AVS for patient education given to patient -Discussed importance of 150 minutes of physical activity weekly, eat two servings of fish weekly, eat one serving of tree nuts ( cashews, pistachios, pecans, almonds.Marland Kitchen) every other day, eat 6 servings of fruit/vegetables daily and drink plenty of water  and avoid sweet beverages.  -Reviewed Health Maintenance: yes

## 2021-09-18 NOTE — Patient Instructions (Addendum)
COVID-19 and RSV vaccines  Preventive Care 10 Years and Older, Male Preventive care refers to lifestyle choices and visits with your health care provider that can promote health and wellness. Preventive care visits are also called wellness exams. What can I expect for my preventive care visit? Counseling During your preventive care visit, your health care provider may ask about your: Medical history, including: Past medical problems. Family medical history. History of falls. Current health, including: Emotional well-being. Home life and relationship well-being. Sexual activity. Memory and ability to understand (cognition). Lifestyle, including: Alcohol, nicotine or tobacco, and drug use. Access to firearms. Diet, exercise, and sleep habits. Work and work Statistician. Sunscreen use. Safety issues such as seatbelt and bike helmet use. Physical exam Your health care provider will check your: Height and weight. These may be used to calculate your BMI (body mass index). BMI is a measurement that tells if you are at a healthy weight. Waist circumference. This measures the distance around your waistline. This measurement also tells if you are at a healthy weight and may help predict your risk of certain diseases, such as type 2 diabetes and high blood pressure. Heart rate and blood pressure. Body temperature. Skin for abnormal spots. What immunizations do I need?  Vaccines are usually given at various ages, according to a schedule. Your health care provider will recommend vaccines for you based on your age, medical history, and lifestyle or other factors, such as travel or where you work. What tests do I need? Screening Your health care provider may recommend screening tests for certain conditions. This may include: Lipid and cholesterol levels. Diabetes screening. This is done by checking your blood sugar (glucose) after you have not eaten for a while (fasting). Hepatitis C  test. Hepatitis B test. HIV (human immunodeficiency virus) test. STI (sexually transmitted infection) testing, if you are at risk. Lung cancer screening. Colorectal cancer screening. Prostate cancer screening. Abdominal aortic aneurysm (AAA) screening. You may need this if you are a current or former smoker. Talk with your health care provider about your test results, treatment options, and if necessary, the need for more tests. Follow these instructions at home: Eating and drinking  Eat a diet that includes fresh fruits and vegetables, whole grains, lean protein, and low-fat dairy products. Limit your intake of foods with high amounts of sugar, saturated fats, and salt. Take vitamin and mineral supplements as recommended by your health care provider. Do not drink alcohol if your health care provider tells you not to drink. If you drink alcohol: Limit how much you have to 0-2 drinks a day. Know how much alcohol is in your drink. In the [place] one drink equals one 12 oz bottle of beer (355 mL), one 5 oz glass of wine (148 mL), or one 1 oz glass of hard liquor (44 mL). Lifestyle Brush your teeth every morning and night with fluoride toothpaste. Floss one time each day. Exercise for at least 30 minutes 5 or more days each week. Do not use any products that contain nicotine or tobacco. These products include cigarettes, chewing tobacco, and vaping devices, such as e-cigarettes. If you need help quitting, ask your health care provider. Do not use drugs. If you are sexually active, practice safe sex. Use a condom or other form of protection to prevent STIs. Take aspirin only as told by your health care provider. Make sure that you understand how much to take and what form to take. Work with your health care provider to find  out whether it is safe and beneficial for you to take aspirin daily. Ask your health care provider if you need to take a cholesterol-lowering medicine (statin). Find healthy  ways to manage stress, such as: Meditation, yoga, or listening to music. Journaling. Talking to a trusted person. Spending time with friends and family. Safety Always wear your seat belt while driving or riding in a vehicle. Do not drive: If you have been drinking alcohol. Do not ride with someone who has been drinking. When you are tired or distracted. While texting. If you have been using any mind-altering substances or drugs. Wear a helmet and other protective equipment during sports activities. If you have firearms in your house, make sure you follow all gun safety procedures. Minimize exposure to UV radiation to reduce your risk of skin cancer. What's next? Visit your health care provider once a year for an annual wellness visit. Ask your health care provider how often you should have your eyes and teeth checked. Stay up to date on all vaccines. This information is not intended to replace advice given to you by your health care provider. Make sure you discuss any questions you have with your health care provider. Document Revised: 06/20/2020 Document Reviewed: 06/20/2020 Elsevier Patient Education  [place].

## 2021-09-19 ENCOUNTER — Encounter: Payer: Self-pay | Admitting: Family Medicine

## 2021-09-19 ENCOUNTER — Ambulatory Visit (INDEPENDENT_AMBULATORY_CARE_PROVIDER_SITE_OTHER): Payer: Medicare Other | Admitting: Family Medicine

## 2021-09-19 VITALS — BP 128/74 | HR 78 | Resp 16 | Ht 74.0 in | Wt 298.0 lb

## 2021-09-19 DIAGNOSIS — N138 Other obstructive and reflux uropathy: Secondary | ICD-10-CM

## 2021-09-19 DIAGNOSIS — N401 Enlarged prostate with lower urinary tract symptoms: Secondary | ICD-10-CM

## 2021-09-19 DIAGNOSIS — Z23 Encounter for immunization: Secondary | ICD-10-CM | POA: Diagnosis not present

## 2021-09-19 DIAGNOSIS — Z1211 Encounter for screening for malignant neoplasm of colon: Secondary | ICD-10-CM | POA: Diagnosis not present

## 2021-09-19 DIAGNOSIS — Z0000 Encounter for general adult medical examination without abnormal findings: Secondary | ICD-10-CM

## 2021-09-19 DIAGNOSIS — N2581 Secondary hyperparathyroidism of renal origin: Secondary | ICD-10-CM

## 2021-09-19 DIAGNOSIS — N1831 Chronic kidney disease, stage 3a: Secondary | ICD-10-CM | POA: Diagnosis not present

## 2021-09-19 DIAGNOSIS — E785 Hyperlipidemia, unspecified: Secondary | ICD-10-CM

## 2021-09-19 DIAGNOSIS — I700 Atherosclerosis of aorta: Secondary | ICD-10-CM

## 2021-09-19 DIAGNOSIS — D638 Anemia in other chronic diseases classified elsewhere: Secondary | ICD-10-CM | POA: Diagnosis not present

## 2021-09-19 DIAGNOSIS — L989 Disorder of the skin and subcutaneous tissue, unspecified: Secondary | ICD-10-CM

## 2021-09-20 LAB — LIPID PANEL
Cholesterol: 147 mg/dL (ref <200)
HDL: 46 mg/dL (ref >=40)
LDL Cholesterol (Calc): 85 mg/dL (calc)
Non-HDL Cholesterol (Calc): 101 mg/dL (calc) (ref <130)
Total CHOL/HDL Ratio: 3.2 (calc) (ref <5.0)
Triglycerides: 71 mg/dL (ref <150)

## 2021-09-20 LAB — COMPLETE METABOLIC PANEL WITH GFR
AG Ratio: 1.3 (calc) (ref 1.0–2.5)
ALT: 22 U/L (ref 9–46)
AST: 32 U/L (ref 10–35)
Albumin: 4.1 g/dL (ref 3.6–5.1)
Alkaline phosphatase (APISO): 74 U/L (ref 35–144)
BUN/Creatinine Ratio: 15 (calc) (ref 6–22)
BUN: 22 mg/dL (ref 7–25)
CO2: 30 mmol/L (ref 20–32)
Calcium: 9.4 mg/dL (ref 8.6–10.3)
Chloride: 101 mmol/L (ref 98–110)
Creat: 1.5 mg/dL — ABNORMAL HIGH (ref 0.70–1.28)
Globulin: 3.2 g/dL (calc) (ref 1.9–3.7)
Glucose, Bld: 108 mg/dL — ABNORMAL HIGH (ref 65–99)
Potassium: 4.1 mmol/L (ref 3.5–5.3)
Sodium: 139 mmol/L (ref 135–146)
Total Bilirubin: 0.4 mg/dL (ref 0.2–1.2)
Total Protein: 7.3 g/dL (ref 6.1–8.1)
eGFR: 50 mL/min/{1.73_m2} — ABNORMAL LOW (ref >=60)

## 2021-09-20 LAB — CBC WITH DIFFERENTIAL/PLATELET
Absolute Monocytes: 564 cells/uL (ref 200–950)
Basophils Absolute: 49 cells/uL (ref 0–200)
Basophils Relative: 1 %
Eosinophils Absolute: 172 cells/uL (ref 15–500)
Eosinophils Relative: 3.5 %
HCT: 33.6 % — ABNORMAL LOW (ref 38.5–50.0)
Hemoglobin: 11.4 g/dL — ABNORMAL LOW (ref 13.2–17.1)
Lymphs Abs: 1793 cells/uL (ref 850–3900)
MCH: 28.7 pg (ref 27.0–33.0)
MCHC: 33.9 g/dL (ref 32.0–36.0)
MCV: 84.6 fL (ref 80.0–100.0)
MPV: 10.8 fL (ref 7.5–12.5)
Monocytes Relative: 11.5 %
Neutro Abs: 2323 cells/uL (ref 1500–7800)
Neutrophils Relative %: 47.4 %
Platelets: 194 10*3/uL (ref 140–400)
RBC: 3.97 10*6/uL — ABNORMAL LOW (ref 4.20–5.80)
RDW: 13.2 % (ref 11.0–15.0)
Total Lymphocyte: 36.6 %
WBC: 4.9 10*3/uL (ref 3.8–10.8)

## 2021-09-20 LAB — VITAMIN D 25 HYDROXY (VIT D DEFICIENCY, FRACTURES): Vit D, 25-Hydroxy: 53 ng/mL (ref 30–100)

## 2021-09-20 LAB — PARATHYROID HORMONE, INTACT (NO CA): PTH: 68 pg/mL (ref 16–77)

## 2021-09-20 LAB — PSA: PSA: 1.59 ng/mL (ref <=4.00)

## 2021-10-05 ENCOUNTER — Other Ambulatory Visit: Payer: Self-pay | Admitting: Family Medicine

## 2021-10-05 DIAGNOSIS — N138 Other obstructive and reflux uropathy: Secondary | ICD-10-CM

## 2021-10-10 NOTE — Progress Notes (Signed)
Name: [name]   MRN: [id number]    DOB: 1950-11-09   Date:10/11/2021       Progress Note  Subjective  Chief Complaint  Shave Biopsy  HPI  Lesion on left upper back: found during his CPE in Sept, irregular borders and dark in color he came in today for a shave biopsy  Morbid Obese: he was up to 303 lbs May 2020 and has been fluctuating since, but last visit with me 08/2020 it was 298 lbs it went down to  289 lbs in May 2023  but is up again . He states he is able to lose weight when more active    Atherosclerosis of aorta: on Eliquis and atorvastatin , last LDL was a little higher than previously and discussed rechecking it    Asthma Moderate: he is using Trelegy and singulair daily, no longer using rescue inhaler daily He states symptoms are controlled, no cough, wheezing or SOB   History of anemia: but seen by hematologist level back in Nov 2019 and Hgb  was  11.5 . Had blood transfusion after his left hip replacement surgery Dec 2020. He  now has anemia of chronic disease  HCT has been stable.    CKI stage III: under the care of Dr. Holley Raring. No pruritus. 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 Anemia of chronic disease is stable. Parathyroid hormone level back to normal  Last GFR was 50    Dyslipidemia: he is not sure if he is taking Atorvastatin, last LDL  still not at goal at 85, we will change to Rosuvastatin    Chronic DVT: on Eliquis, no side effects of medication , no easy bruising. No leg pain or swelling with ambulation. Left side    GERD and eructation: he is doing well on Pepcid No heartburn or indigestion    BPH/ED:  He is taking medications, Cialis 5 mg was not working for ED, we added 10 mg but it made him flush so we will change to 5 mg to take one daily and add extra 5 mg every 3 days prn intercourse and takes Flomax . Unchanged    AR: he has been taking singulair, Allegra  180 mg daily , he is no longer taking Allegra D. 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/2020   Osteoarthritis, multiple sites 06/16/2019   Morbid obesity ([place]) 06/16/2019   Atherosclerosis of abdominal aorta ([place]) 09/15/2017   Chronic deep vein thrombosis (DVT) of left popliteal vein ([place]) 06/26/2017   History of benign neoplasm of adrenal gland 02/11/2016   History of iron deficiency anemia 10/10/2015   BPH (benign prostatic hyperplasia) 06/20/2015   ED (erectile dysfunction) 06/20/2015   Allergic rhinitis, seasonal 06/20/2015   Anemia of chronic disease 06/20/2015   Hypogonadism in male 06/20/2015   Asthma, well controlled, moderate persistent 06/20/2015   Hyperglycemia 06/20/2015   History of shingles 06/20/2015   GERD without esophagitis 06/20/2015   Chronic radicular low back pain 06/20/2015   History of epilepsy 06/20/2015   Migraine without aura and without status migrainosus, not intractable 06/20/2015   Dyslipidemia 06/20/2015   Primary osteoarthritis of both knees 06/20/2015    Past Surgical History:  Procedure Laterality Date   ADRENALECTOMY Left 02/11/2016   [facility]   COLONOSCOPY  02/2012   normal  JOINT REPLACEMENT     KNEE ARTHROSCOPY Left 10/06/2009   SINUS EXPLORATION     TOTAL HIP ARTHROPLASTY Left 12/14/2017   Procedure: TOTAL HIP ARTHROPLASTY ANTERIOR APPROACH;  Surgeon: Dorna Leitz, MD;  Location: [facility];  Service: Orthopedics;  Laterality: Left;   TOTAL KNEE ARTHROPLASTY Right 06/20/2016   TOTAL KNEE ARTHROPLASTY Right 06/20/2016   Procedure: TOTAL KNEE ARTHROPLASTY;  Surgeon: Dorna Leitz, MD;  Location: [facility];  Service: Orthopedics;  Laterality: Right;   TOTAL KNEE ARTHROPLASTY Left 12/19/2016   Procedure: LEFT TOTAL KNEE ARTHROPLASTY;  Surgeon: Dorna Leitz, MD;  Location: [facility];  Service: Orthopedics;  Laterality: Left;  Adductor Block    Family  History  Problem Relation Age of Onset   Diabetes Mother    Heart disease Mother    Lung disease Mother    Seizures Maternal Grandmother    Alzheimer's disease Brother     Social History   Tobacco Use   Smoking status: Former    Packs/day: 1.00    Years: 10.00    Total pack years: 10.00    Types: Cigarettes    Quit date: 1980    Years since quitting: 43.7   Smokeless tobacco: Never   Tobacco comments:    38 years ago 71 when he stopped  Substance Use Topics   Alcohol use: No    Alcohol/week: 0.0 standard drinks of alcohol     Current Outpatient Medications:    albuterol (VENTOLIN HFA) 108 (90 Base) MCG/ACT inhaler, INHALE 2 PUFFS BY MOUTH EVERY 4 HOURS AS NEEDED FOR WHEEZE OR FOR SHORTNESS OF BREATH, Disp: 8.5 each, Rfl: 0   apixaban (ELIQUIS) 5 MG TABS tablet, Take 1 tablet (5 mg total) by mouth 2 (two) times daily., Disp: 60 tablet, Rfl: 5   atorvastatin (LIPITOR) 40 MG tablet, TAKE 1 TABLET BY MOUTH EVERY DAY, Disp: 90 tablet, Rfl: 1   azelastine (OPTIVAR) 0.05 % ophthalmic solution, PLACE 2 DROPS INTO BOTH EYES 2 (TWO) TIMES DAILY., Disp: 18 mL, Rfl: 1   famotidine (PEPCID) 20 MG tablet, TAKE 1 TABLET BY MOUTH TWICE A DAY, Disp: 180 tablet, Rfl: 1   fluticasone (FLONASE) 50 MCG/ACT nasal spray, Place 2 sprays into both nostrils daily., Disp: 48 mL, Rfl: 1   Fluticasone-Umeclidin-Vilant (TRELEGY ELLIPTA) 100-62.5-25 MCG/ACT AEPB, Inhale 1 puff into the lungs daily., Disp: 1 each, Rfl: 5   montelukast (SINGULAIR) 10 MG tablet, TAKE 1 TABLET BY MOUTH EVERY DAY, Disp: 90 tablet, Rfl: 1   Multiple Vitamins-Minerals (MULTIVITAMIN ADULTS 50+ PO), Take 1 tablet by mouth daily. NATURE'S CODE MEN OVER 50 MULTIVITAMIN PACK, Disp: , Rfl:    Polyethyl Glycol-Propyl Glycol 0.4-0.3 % SOLN, Place 1-2 drops into both eyes 3 (three) times daily as needed (for dry/irritated eyes.)., Disp: , Rfl:    tadalafil (CIALIS) 5 MG tablet, TAKE 1 TABLET BY MOUTH ONCE DAILY .  MAY  TAKE  2  AS  NEEDED   FOR  INTERCOURSE, Disp: 90 tablet, Rfl: 0   tamsulosin (FLOMAX) 0.4 MG CAPS capsule, TAKE 1 CAPSULE BY MOUTH EVERY DAY IN THE EVENING, Disp: 90 capsule, Rfl: 1   tiZANidine (ZANAFLEX) 2 MG tablet, Take 1 tablet (2 mg total) by mouth 2 (two) times daily., Disp: 180 tablet, Rfl: 1   VELTASSA 8.4 g packet, Take 1 packet by mouth daily., Disp: , Rfl:    vitamin B-12 (CYANOCOBALAMIN) 500 MCG tablet, Take 500 mcg by mouth daily., Disp: , Rfl:   Allergies  Allergen Reactions  Almond (Diagnostic) Other (See Comments)    Migraines   Lactose Intolerance (Gi) Other (See Comments)    MIGRAINES   Peanut-Containing Drug Products Other (See Comments)    Migraines   Shellfish Allergy Other (See Comments)    Congestion/breathing problems/migraines.    I personally reviewed active problem list, medication list, allergies, family history, social history, health maintenance with the patient/caregiver today.   ROS  Constitutional: Negative for fever or weight change.  Respiratory: Negative for cough and shortness of breath.   Cardiovascular: Negative for chest pain or palpitations.  Gastrointestinal: Negative for abdominal pain, no bowel changes.  Musculoskeletal: Negative for gait problem or joint swelling.  Skin: Negative for rash.  Neurological: Negative for dizziness or headache.  No other specific complaints in a complete review of systems (except as listed in HPI above).   Objective  Vitals:   10/11/21 1303  BP: 126/68  Pulse: 89  Resp: 16  SpO2: 96%  Weight: 298 lb (135.2 kg)  Height: 6' 2"  (1.88 m)    Body mass index is 38.26 kg/m.  Physical Exam  Constitutional: Patient appears well-developed and well-nourished. Obese  No distress.  HEENT: head atraumatic, normocephalic, pupils equal and reactive to light, neck supple Cardiovascular: Normal rate, regular rhythm and normal heart sounds.  No murmur heard. No BLE edema. Pulmonary/Chest: Effort normal and breath sounds normal. No  respiratory distress. Abdominal: Soft.  There is no tenderness. Psychiatric: Patient has a normal mood and affect. behavior is normal. Judgment and thought content normal.  Skin: irregular mole on left upper back   Recent Results (from the past 2160 hour(s))  Lipid panel     Status: None   Collection Time: 09/19/21  9:51 AM  Result Value Ref Range   Cholesterol 147 <200 mg/dL   HDL 46 > OR = 40 mg/dL   Triglycerides 71 <150 mg/dL   LDL Cholesterol (Calc) 85 mg/dL (calc)    Comment: Reference range: <100 . Desirable range <100 mg/dL for primary prevention;   <70 mg/dL for patients with CHD or diabetic patients  with > or = 2 CHD risk factors. Marland Kitchen LDL-C is now calculated using the Martin-Hopkins  calculation, which is a validated novel method providing  better accuracy than the Friedewald equation in the  estimation of LDL-C.  Cresenciano Genre et al. Annamaria Helling. 0488;891(69): 2061-2068  (http://education.[facility].com/faq/FAQ164)    Total CHOL/HDL Ratio 3.2 <5.0 (calc)   Non-HDL Cholesterol (Calc) 101 <130 mg/dL (calc)    Comment: For patients with diabetes plus 1 major ASCVD risk  factor, treating to a non-HDL-C goal of <100 mg/dL  (LDL-C of <70 mg/dL) is considered a therapeutic  option.   Parathyroid hormone, intact (no Ca)     Status: None   Collection Time: 09/19/21  9:51 AM  Result Value Ref Range   PTH 68 16 - 77 pg/mL    Comment: . Interpretive Guide    Intact PTH           Calcium ------------------    ----------           ------- Normal Parathyroid    Normal               Normal Hypoparathyroidism    Low or Low Normal    Low Hyperparathyroidism    Primary            Normal or High       High    Secondary  High                 Normal or Low    Tertiary           High                 High Non-Parathyroid    Hypercalcemia      Low or Low Normal    High .   PSA     Status: None   Collection Time: 09/19/21  9:51 AM  Result Value Ref Range   PSA 1.59 < OR = 4.00  ng/mL    Comment: The total PSA value from this assay system is  standardized against the WHO standard. The test  result will be approximately 20% lower when compared  to the equimolar-standardized total PSA (Beckman  Coulter). Comparison of serial PSA results should be  interpreted with this fact in mind. . This test was performed using the Siemens  chemiluminescent method. Values obtained from  different assay methods cannot be used interchangeably. PSA levels, regardless of value, should not be interpreted as absolute evidence of the presence or absence of disease.   VITAMIN D 25 Hydroxy (Vit-D Deficiency, Fractures)     Status: None   Collection Time: 09/19/21  9:51 AM  Result Value Ref Range   Vit D, 25-Hydroxy 53 30 - 100 ng/mL    Comment: Vitamin D Status         25-OH Vitamin D: . Deficiency:                    <20 ng/mL Insufficiency:             20 - 29 ng/mL Optimal:                 > or = 30 ng/mL . For 25-OH Vitamin D testing on patients on  D2-supplementation and patients for whom quantitation  of D2 and D3 fractions is required, the Quest[employer]ureD(TM) 25-OH VIT D, (D2,D3), LC/MS/MS is recommended: order  code [id number] (patients >60yr). . See Note 1 . Note 1 . For additional information, please refer to  http://education.[facility].com/faq/FAQ199  (This link is being provided for informational/ educational purposes only.)   COMPLETE METABOLIC PANEL WITH GFR     Status: Abnormal   Collection Time: 09/19/21  9:51 AM  Result Value Ref Range   Glucose, Bld 108 (H) 65 - 99 mg/dL    Comment: .            Fasting reference interval . For someone without known diabetes, a glucose value between 100 and 125 mg/dL is consistent with prediabetes and should be confirmed with a follow-up test. .    BUN 22 7 - 25 mg/dL   Creat 1.50 (H) 0.70 - 1.28 mg/dL   eGFR 50 (L) > OR = 60 mL/min/1.742m  BUN/Creatinine Ratio 15 6 - 22 (calc)   Sodium 139 135 - 146 mmol/L    Potassium 4.1 3.5 - 5.3 mmol/L   Chloride 101 98 - 110 mmol/L   CO2 30 20 - 32 mmol/L   Calcium 9.4 8.6 - 10.3 mg/dL   Total Protein 7.3 6.1 - 8.1 g/dL   Albumin 4.1 3.6 - 5.1 g/dL   Globulin 3.2 1.9 - 3.7 g/dL (calc)   AG Ratio 1.3 1.0 - 2.5 (calc)   Total Bilirubin 0.4 0.2 - 1.2 mg/dL   Alkaline phosphatase (APISO) 74 35 - 144 U/L   AST 32 10 -  35 U/L   ALT 22 9 - 46 U/L  CBC with Differential/Platelet     Status: Abnormal   Collection Time: 09/19/21  9:51 AM  Result Value Ref Range   WBC 4.9 3.8 - 10.8 Thousand/uL   RBC 3.97 (L) 4.20 - 5.80 Million/uL   Hemoglobin 11.4 (L) 13.2 - 17.1 g/dL   HCT 33.6 (L) 38.5 - 50.0 %   MCV 84.6 80.0 - 100.0 fL   MCH 28.7 27.0 - 33.0 pg   MCHC 33.9 32.0 - 36.0 g/dL   RDW 13.2 11.0 - 15.0 %   Platelets 194 140 - 400 Thousand/uL   MPV 10.8 7.5 - 12.5 fL   Neutro Abs 2,323 1,500 - 7,800 cells/uL   Lymphs Abs 1,793 850 - 3,900 cells/uL   Absolute Monocytes 564 200 - 950 cells/uL   Eosinophils Absolute 172 15 - 500 cells/uL   Basophils Absolute 49 0 - 200 cells/uL   Neutrophils Relative % 47.4 %   Total Lymphocyte 36.6 %   Monocytes Relative 11.5 %   Eosinophils Relative 3.5 %   Basophils Relative 1.0 %    PHQ2/9:    10/11/2021    1:03 PM 09/19/2021    8:47 AM 05/14/2021    2:35 PM 03/11/2021   12:48 PM 12/05/2020    1:27 PM  Depression screen PHQ 2/9  Decreased Interest 0 0 0 0 0  Down, Depressed, Hopeless 0 0 0 0 0  PHQ - 2 Score 0 0 0 0 0  Altered sleeping 0 0 0 0 0  Tired, decreased energy 0 0 0 0 0  Change in appetite 0 0 0 0 0  Feeling bad or failure about yourself  0 0 0 0 0  Trouble concentrating 0 0 0 0 0  Moving slowly or fidgety/restless 0 0 0 0 0  Suicidal thoughts 0 0 0 0 0  PHQ-9 Score 0 0 0 0 0  Difficult doing work/chores    Not difficult at all Not difficult at all    phq 9 is negative   Fall Risk:    10/11/2021    1:03 PM 09/19/2021    8:47 AM 05/14/2021    2:35 PM 03/11/2021   12:48 PM 12/05/2020    1:27 PM   Fall Risk   Falls in the past year? 0 0 0 0 0  Number falls in past yr: 0 0 0 0 0  Injury with Fall? 0 0 0 0 0  Risk for fall due to : No Fall Risks No Fall Risks No Fall Risks No Fall Risks   Follow up Falls prevention discussed Falls prevention discussed Falls prevention discussed Falls prevention discussed       Functional Status Survey: Is the patient deaf or have difficulty hearing?: Yes Does the patient have difficulty seeing, even when wearing glasses/contacts?: No Does the patient have difficulty concentrating, remembering, or making decisions?: No Does the patient have difficulty walking or climbing stairs?: No Does the patient have difficulty dressing or bathing?: No Does the patient have difficulty doing errands alone such as visiting a doctor's office or shopping?: No    Assessment & Plan  1. Back skin lesion  - lidocaine (PF) (XYLOCAINE) 1 % injection 2 mL - Surgical pathology   Consent signed: YES  Procedure: Skin Mass Removal Location: left upper back   Equipment used: derma-blade, high temperature cautery, sterile scalpel, tweezers, curved scissors Anesthesia: 1% Lidocaine w/o Epinephrine  Cleaned and prepped: Betadine  After consent signed, are of skin prepped with betadine. Lidocaine w/o epinephrine injected into skin underneath skin mass. After properly numbed sterile equipment used to remove tag.  Specimen sent for pathology analysis. Instructed on proper care to allow for proper healing. F/U for nursing visit if needed.   2. Chronic kidney disease, stage 3a (HCC)  Stable   3. Atherosclerosis of abdominal aorta (HCC)  - rosuvastatin (CRESTOR) 40 MG tablet; Take 1 tablet (40 mg total) by mouth daily. In place of Atorvastatin for cholesterol  Dispense: 90 tablet; Refill: 3  4. Chronic deep vein thrombosis (DVT) of calf muscle vein of left lower extremity (HCC)  - apixaban (ELIQUIS) 5 MG TABS tablet; Take 1 tablet (5 mg total) by mouth 2 (two) times  daily.  Dispense: 60 tablet; Refill: 5  5. 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 Chronic deep vein thrombosis (DVT) of left popliteal vein (HCC)   7. Asthma, well controlled, moderate persistent  - Fluticasone-Umeclidin-Vilant (TRELEGY ELLIPTA) 100-62.5-25 MCG/ACT AEPB; Inhale 1 puff into the lungs daily.  Dispense: 1 each; Refill: 5  8. Dyslipidemia  - rosuvastatin (CRESTOR) 40 MG tablet; Take 1 tablet (40 mg total) by mouth daily. In place of Atorvastatin for cholesterol  Dispense: 90 tablet; Refill: 3  9. Anemia of chronic disease

## 2021-10-11 ENCOUNTER — Encounter: Payer: Self-pay | Admitting: Family Medicine

## 2021-10-11 ENCOUNTER — Ambulatory Visit (INDEPENDENT_AMBULATORY_CARE_PROVIDER_SITE_OTHER): Payer: Medicare Other | Admitting: Family Medicine

## 2021-10-11 ENCOUNTER — Other Ambulatory Visit (HOSPITAL_COMMUNITY)
Admission: RE | Admit: 2021-10-11 | Discharge: 2021-10-11 | Disposition: A | Discharge status: Home / Self Care | Payer: Medicare Other | Source: Ambulatory Visit | Attending: Family Medicine | Admitting: Family Medicine

## 2021-10-11 VITALS — BP 126/68 | HR 89 | Resp 16 | Ht 74.0 in | Wt 298.0 lb

## 2021-10-11 DIAGNOSIS — I700 Atherosclerosis of aorta: Secondary | ICD-10-CM | POA: Diagnosis not present

## 2021-10-11 DIAGNOSIS — L989 Disorder of the skin and subcutaneous tissue, unspecified: Secondary | ICD-10-CM | POA: Insufficient documentation

## 2021-10-11 DIAGNOSIS — J4540 Moderate persistent asthma, uncomplicated: Secondary | ICD-10-CM

## 2021-10-11 DIAGNOSIS — D638 Anemia in other chronic diseases classified elsewhere: Secondary | ICD-10-CM | POA: Diagnosis not present

## 2021-10-11 DIAGNOSIS — E896 Postprocedural adrenocortical (-medullary) hypofunction: Secondary | ICD-10-CM

## 2021-10-11 DIAGNOSIS — E785 Hyperlipidemia, unspecified: Secondary | ICD-10-CM | POA: Diagnosis not present

## 2021-10-11 DIAGNOSIS — I82562 Chronic embolism and thrombosis of left calf muscular vein: Secondary | ICD-10-CM

## 2021-10-11 DIAGNOSIS — I82532 Chronic embolism and thrombosis of left popliteal vein: Secondary | ICD-10-CM

## 2021-10-11 DIAGNOSIS — N1831 Chronic kidney disease, stage 3a: Secondary | ICD-10-CM | POA: Diagnosis not present

## 2021-10-11 DIAGNOSIS — L83 Acanthosis nigricans: Secondary | ICD-10-CM | POA: Diagnosis not present

## 2021-10-11 DIAGNOSIS — L821 Other seborrheic keratosis: Secondary | ICD-10-CM | POA: Diagnosis not present

## 2021-10-11 MED ORDER — APIXABAN 5 MG PO TABS: 5.0000 mg | ORAL_TABLET | Freq: Two times a day (BID) | ORAL | 5 refills | Status: DC

## 2021-10-11 MED ORDER — TRELEGY ELLIPTA 100-62.5-25 MCG/ACT IN AEPB: 1.0000 | INHALATION_SPRAY | Freq: Every day | RESPIRATORY_TRACT | 5 refills | Status: DC

## 2021-10-11 MED ORDER — ROSUVASTATIN CALCIUM 40 MG PO TABS: 40.0000 mg | ORAL_TABLET | Freq: Every day | ORAL | 3 refills | Status: DC

## 2021-10-11 MED ORDER — LIDOCAINE HCL (PF) 1 % IJ SOLN
2.0000 mL | Freq: Once | INTRAMUSCULAR | Status: AC
  Administered 2021-10-11: 2 mL via INTRADERMAL

## 2021-10-17 ENCOUNTER — Encounter: Payer: Self-pay | Admitting: Gastroenterology

## 2021-10-18 ENCOUNTER — Other Ambulatory Visit: Payer: Self-pay | Admitting: Family Medicine

## 2021-10-18 DIAGNOSIS — N529 Male erectile dysfunction, unspecified: Secondary | ICD-10-CM

## 2021-10-18 LAB — SURGICAL PATHOLOGY

## 2021-10-25 ENCOUNTER — Telehealth: Payer: Self-pay | Admitting: Family Medicine

## 2021-10-25 NOTE — Telephone Encounter (Signed)
Patient called and is requesting a Trelegy Coupon but we don't have any. I went to print one but had a few questions to answer that I would not know about his medical concerns Could you please print him one and he is coming by to pick it up. Thanks

## 2021-10-29 ENCOUNTER — Encounter: Payer: Self-pay | Admitting: Gastroenterology

## 2021-10-29 ENCOUNTER — Ambulatory Visit (INDEPENDENT_AMBULATORY_CARE_PROVIDER_SITE_OTHER): Payer: Medicare Other | Admitting: Gastroenterology

## 2021-10-29 ENCOUNTER — Telehealth: Payer: Self-pay

## 2021-10-29 VITALS — BP 124/72 | HR 88 | Ht 74.0 in | Wt 294.5 lb

## 2021-10-29 DIAGNOSIS — K219 Gastro-esophageal reflux disease without esophagitis: Secondary | ICD-10-CM

## 2021-10-29 DIAGNOSIS — Z1211 Encounter for screening for malignant neoplasm of colon: Secondary | ICD-10-CM

## 2021-10-29 DIAGNOSIS — K439 Ventral hernia without obstruction or gangrene: Secondary | ICD-10-CM

## 2021-10-29 DIAGNOSIS — Z7901 Long term (current) use of anticoagulants: Secondary | ICD-10-CM

## 2021-10-29 DIAGNOSIS — Z86718 Personal history of other venous thrombosis and embolism: Secondary | ICD-10-CM

## 2021-10-29 MED ORDER — CLENPIQ 10-3.5-12 MG-GM -GM/175ML PO SOLN: 1.0000 | Freq: Once | ORAL | 0 refills | Status: AC

## 2021-10-29 NOTE — Progress Notes (Signed)
Chief Complaint:  Colon cancer screening   Referring Provider:     Steele Sizer, MD   HPI:     [name] is a 71 y.o. male with a history of asthma, chronic anemia (follows with Hematology), CKD 3, dyslipidemia, atherosclerosis, DVT (on Eliquis), BPH, allergic rhinitis, HTN, migraine, GERD, left adrenal adenoma s/p adrenalectomy, referred to the Gastroenterology Clinic for evaluation of continued colon cancer screening and medication management in the perioperative setting.  Separately, has noticed abdominal muscle bulging and "like a rock" when he exercises.  Started shortly after adrenalectomy in 2018.  No pain.  Does not otherwise limit him.  Not associated with p.o. intake, bowel habits, nausea/vomiting, etc.  Separately, has a longstanding history of GERD which is well controlled with Pepcid 20 mg bid.   Endoscopic History: - Colonoscopy (02/11/2012): Normal per notes and patient (actual endoscopy report not available for review) - EGD (02/11/2012: Only pathology report available for review and notable for mild chronic, inactive non-H. pylori gastritis     Latest Ref Rng & Units 09/19/2021    9:51 AM 03/11/2021    2:01 PM 08/06/2020    4:10 PM  CBC  WBC 3.8 - 10.8 Thousand/uL 4.9  5.1  5.9   Hemoglobin 13.2 - 17.1 g/dL 11.4  11.4  11.9   Hematocrit 38.5 - 50.0 % 33.6  34.3  35.6   Platelets 140 - 400 Thousand/uL 194  192  212       Latest Ref Rng & Units 09/19/2021    9:51 AM 03/11/2021    2:01 PM 08/06/2020    4:10 PM  CMP  Glucose 65 - 99 mg/dL 108  96  95   BUN 7 - 25 mg/dL 22  27  32   Creatinine 0.70 - 1.28 mg/dL 1.50  1.50  1.64   Sodium 135 - 146 mmol/L 139  139  138   Potassium 3.5 - 5.3 mmol/L 4.1  4.1  4.4   Chloride 98 - 110 mmol/L 101  100  100   CO2 20 - 32 mmol/L '30  29  28   '$ Calcium 8.6 - 10.3 mg/dL 9.4  9.8  9.6   Total Protein 6.1 - 8.1 g/dL 7.3  7.1  7.5   Total Bilirubin 0.2 - 1.2 mg/dL 0.4  0.5  0.4   AST 10 - 35 U/L 32  25  27    ALT 9 - 46 U/L '22  21  21      '$ Past Medical History:  Diagnosis Date   Allergic rhinitis    Anemia    BPH (benign prostatic hyperplasia)    Chronic kidney disease    Chronic sinusitis    Complication of anesthesia    work up during surgery 2x in the past    Decreased libido    DVT (deep venous thrombosis) (HCC)    left leg after left knee surgery   ED (erectile dysfunction)    Fatigue    Hematuria    HTN (hypertension)    history of   Hypogonadism in male    Hypokalemia    history of    IBS (irritable bowel syndrome)    Low serum vitamin D    Lumbago    Migraine    Mild intermittent asthma    Osteoarthritis of hip    left (bone-on-bone) Dr. Alta Corning & Alvina Filbert. Modena Slater, PA-C  Pneumonia    walking pneumonia   Reflux    Shingles    Unilateral inguinal hernia without obstruction or gangrene      Past Surgical History:  Procedure Laterality Date   ADRENALECTOMY Left 02/11/2016   [facility]   COLONOSCOPY  02/2012   normal   JOINT REPLACEMENT     KNEE ARTHROSCOPY Left 10/06/2009   SINUS EXPLORATION     TOTAL HIP ARTHROPLASTY Left 12/14/2017   Procedure: TOTAL HIP ARTHROPLASTY ANTERIOR APPROACH;  Surgeon: Dorna Leitz, MD;  [facility];  Service: [facility]opedics;  Laterality: Left;   TOTAL KNEE ARTHROPLASTY Right 06/20/2016   TOTAL KNEE ARTHROPLASTY Right 06/20/2016   Procedure: TOTAL KNEE ARTHROPLASTY;  Surgeon: Dorna Leitz, MD;  Location: [facility];  Service: Orthopedics;  Laterality: Right;   TOTAL KNEE ARTHROPLASTY Left 12/19/2016   Procedure: LEFT TOTAL KNEE ARTHROPLASTY;  Surgeon: Dorna Leitz, MD;  Location: [facility];  Service: Orthopedics;  Laterality: Left;  Adductor Block   Family History  Problem Relation Age of Onset   Diabetes Mother    Heart disease Mother    Lung disease Mother    Seizures Maternal Grandmother    Alzheimer's disease Brother    Social History   Tobacco Use   Smoking status: Former    Packs/day: 1.00    Years: 10.00    Total pack  years: 10.00    Types: Cigarettes    Quit date: 1980    Years since quitting: 43.8   Smokeless tobacco: Never   Tobacco comments:    39 years ago 61 when he stopped  Vaping Use   Vaping Use: Never used  Substance Use Topics   Alcohol use: No    Alcohol/week: 0.0 standard drinks of alcohol   Drug use: No   Current Outpatient Medications  Medication Sig Dispense Refill   albuterol (VENTOLIN HFA) 108 (90 Base) MCG/ACT inhaler INHALE 2 PUFFS BY MOUTH EVERY 4 HOURS AS NEEDED FOR WHEEZE OR FOR SHORTNESS OF BREATH 8.5 each 0   apixaban (ELIQUIS) 5 MG TABS tablet Take 1 tablet (5 mg total) by mouth 2 (two) times daily. 60 tablet 5   azelastine (OPTIVAR) 0.05 % ophthalmic solution PLACE 2 DROPS INTO BOTH EYES 2 (TWO) TIMES DAILY. 18 mL 1   famotidine (PEPCID) 20 MG tablet TAKE 1 TABLET BY MOUTH TWICE A DAY 180 tablet 1   fluticasone (FLONASE) 50 MCG/ACT nasal spray Place 2 sprays into both nostrils daily. 48 mL 1   Fluticasone-Umeclidin-Vilant (TRELEGY ELLIPTA) 100-62.5-25 MCG/ACT AEPB Inhale 1 puff into the lungs daily. 1 each 5   montelukast (SINGULAIR) 10 MG tablet TAKE 1 TABLET BY MOUTH EVERY DAY 90 tablet 1   Multiple Vitamins-Minerals (MULTIVITAMIN ADULTS 50+ PO) Take 1 tablet by mouth daily. NATURE'S CODE MEN OVER 50 MULTIVITAMIN PACK     Polyethyl Glycol-Propyl Glycol 0.4-0.3 % SOLN Place 1-2 drops into both eyes 3 (three) times daily as needed (for dry/irritated eyes.).     rosuvastatin (CRESTOR) 40 MG tablet Take 1 tablet (40 mg total) by mouth daily. In place of Atorvastatin for cholesterol 90 tablet 3   tadalafil (CIALIS) 5 MG tablet TAKE 1 TABLET BY MOUTH EVERY DAY 30 tablet 1   tamsulosin (FLOMAX) 0.4 MG CAPS capsule TAKE 1 CAPSULE BY MOUTH EVERY DAY IN THE EVENING 90 capsule 1   tiZANidine (ZANAFLEX) 2 MG tablet Take 1 tablet (2 mg total) by mouth 2 (two) times daily. 180 tablet 1   VELTASSA 8.4 g packet Take  1 packet by mouth daily.     vitamin B-12 (CYANOCOBALAMIN) 500 MCG  tablet Take 500 mcg by mouth daily.     No current facility-administered medications for this visit.   Allergies  Allergen Reactions   Almond (Diagnostic) Other (See Comments)    Migraines   Lactose Intolerance (Gi) Other (See Comments)    MIGRAINES   Peanut-Containing Drug Products Other (See Comments)    Migraines   Shellfish Allergy Other (See Comments)    Congestion/breathing problems/migraines.     Review of Systems: All systems reviewed and negative except where noted in HPI.     Physical Exam:    Wt Readings from Last 3 Encounters:  10/29/21 294 lb 8 oz (133.6 kg)  10/11/21 298 lb (135.2 kg)  09/19/21 298 lb (135.2 kg)    BP 124/72   Pulse 88   Ht '6\' 2"'$  (1.88 m)   Wt 294 lb 8 oz (133.6 kg)   BMI 37.81 kg/m  Constitutional:  Pleasant, in no acute distress. Psychiatric: Normal mood and affect. Behavior is normal. Cardiovascular: Normal rate, regular rhythm. No edema Pulmonary/chest: Effort normal and breath sounds normal. No wheezing, rales or rhonchi. Abdominal: Medium sized ventral hernia.  Abdomen otherwise soft, nontender. Bowel sounds active throughout.  Neurological: Alert and oriented to person place and time. Skin: Skin is warm and dry. No rashes noted.   ASSESSMENT AND PLAN;   1) Colon cancer screening Due for ongoing CRC screening.  No active GI symptoms. - Schedule colonoscopy  2) Ventral hernia - Provided reassurance.  No need for surgical referral  3) History of DVT 4) Chronic anticoagulation -Hold Eliquis 2 days before procedure - will instruct when and how to resume after procedure. Low but real risk of cardiovascular event such as heart attack, stroke, embolism, thrombosis or ischemia/infarct of other organs off Eliquis explained and need to seek urgent help if this occurs. The patient consents to proceed. Will communicate by phone or EMR with patient's prescribing provider to confirm that holding Eliquis is reasonable in this case  5)  GERD - Well-controlled on current therapy - Continue antireflux lifestyle/dietary modifications with avoidance of exacerbating type foods    The indications, risks, and benefits of colonoscopy were explained to the patient in detail. Risks include but are not limited to bleeding, perforation, adverse reaction to medications, and cardiopulmonary compromise. Sequelae include but are not limited to the possibility of surgery, hospitalization, and mortality. The patient verbalized understanding and wished to proceed. All questions answered, referred to the scheduler and bowel prep ordered. Further recommendations pending results of the exam.     Lavena Bullion, DO, FACG  10/29/2021, 3:29 PM   Ancil Boozer, Drue Stager, MD

## 2021-10-29 NOTE — Patient Instructions (Addendum)
We have sent the following medications to your pharmacy for you to pick up at your convenience: Clenpiq   If you are age 70 or older, your body mass index should be between 23-30. Your Body mass index is 37.81 kg/m. If this is out of the aforementioned range listed, please consider follow up with your Primary Care Provider.  __________________________________________________________  The [facility] providers would like to encourage you to use [facility] to communicate with providers for non-urgent requests or questions.  Due to long hold times on the telephone, sending your provider a message by [facility] may be a faster and more efficient way to get a response.  Please allow 48 business hours for a response.  Please remember that this is for non-urgent requests.   Due to recent changes in healthcare laws, you may see the results of your imaging and laboratory studies on MyChart before your provider has had a chance to review them.  We understand that in some cases there may be results that are confusing or concerning to you. Not all laboratory results come back in the same time frame and the provider may be waiting for multiple results in order to interpret others.  Please give [place] 48 hours in order for your provider to thoroughly review all the results before contacting the office for clarification of your results.     Thank you for choosing me and [facility].  Vito Cirigliano, D.O.

## 2021-10-29 NOTE — Telephone Encounter (Signed)
Called and left VM to patient to inform him to hold Eliquis 2 days prior to procedure.

## 2021-10-29 NOTE — Telephone Encounter (Signed)
[facility] Pre-operative Risk Assessment     Request for surgical clearance:     Endoscopy Procedure  What type of surgery is being performed?     colonoscopy  When is this surgery scheduled?     12/06/21  What type of clearance is required ?   Pharmacy  Are there any medications that need to be held prior to surgery and how long? Eliquis for 2 days  Practice name and name of physician performing surgery?   Dr. Bryan Lemma,   [facility]  What is your office phone and fax number?      Phone- [phone]  [phone]  Anesthesia type (None, local, MAC, general) ?       MAC

## 2021-10-30 NOTE — Telephone Encounter (Signed)
Patient is informed to hold eliquis 2 days prior to procedure. Patient verbalized understanding.

## 2021-11-05 ENCOUNTER — Ambulatory Visit: Payer: BC Managed Care – PPO | Admitting: Family Medicine

## 2021-11-14 ENCOUNTER — Other Ambulatory Visit: Payer: Self-pay | Admitting: Family Medicine

## 2021-11-14 DIAGNOSIS — N529 Male erectile dysfunction, unspecified: Secondary | ICD-10-CM

## 2021-11-18 ENCOUNTER — Ambulatory Visit: Payer: Medicare Other | Admitting: Family Medicine

## 2021-11-21 ENCOUNTER — Ambulatory Visit: Payer: Self-pay

## 2021-11-21 NOTE — Telephone Encounter (Signed)
  Chief Complaint: advice Symptoms: NA Frequency: today Pertinent Negatives: NA Disposition: '[]'$ ED /'[]'$ Urgent Care (no appt availability in office) / '[]'$ Appointment(In office/virtual)/ '[]'$  [facility]/ '[x]'$ Home Care/ '[]'$ Refused Recommended Disposition /'[]'$ [facility]/ '[]'$  Follow-up with PCP Additional Notes: spoke with pt's wife. Advised her that pt got his flu vaccine on OV 09/19/21. No further assistance was needed.   Summary: flu shot   [name] pt wife wanting to know if pt received his flu shot at last appointment. Stated he just doesn't remember if he received it or not.  Pt wife seeking clinical advice.         Reason for Disposition  Health Information question, no triage required and triager able to answer question  Answer Assessment - Initial Assessment Questions 1. REASON FOR CALL or QUESTION: "What is your reason for calling today?" or "How can I best help you?" or "What question do you have that I can help answer?"     Wife wanting to see if pt got flu shot or not this year  Protocols used: Information Only Call - No Triage-A-AH

## 2021-12-02 ENCOUNTER — Other Ambulatory Visit: Payer: Self-pay | Admitting: Family Medicine

## 2021-12-02 DIAGNOSIS — J302 Other seasonal allergic rhinitis: Secondary | ICD-10-CM

## 2021-12-06 ENCOUNTER — Encounter: Payer: Self-pay | Admitting: Gastroenterology

## 2021-12-06 ENCOUNTER — Ambulatory Visit (AMBULATORY_SURGERY_CENTER): Payer: Medicare Other | Admitting: Gastroenterology

## 2021-12-06 VITALS — BP 111/75 | HR 57 | Temp 98.1°F | Resp 14 | Ht 74.0 in | Wt 294.0 lb

## 2021-12-06 DIAGNOSIS — D125 Benign neoplasm of sigmoid colon: Secondary | ICD-10-CM | POA: Diagnosis not present

## 2021-12-06 DIAGNOSIS — Z1211 Encounter for screening for malignant neoplasm of colon: Secondary | ICD-10-CM | POA: Diagnosis not present

## 2021-12-06 DIAGNOSIS — D128 Benign neoplasm of rectum: Secondary | ICD-10-CM

## 2021-12-06 DIAGNOSIS — K5730 Diverticulosis of large intestine without perforation or abscess without bleeding: Secondary | ICD-10-CM | POA: Diagnosis not present

## 2021-12-06 DIAGNOSIS — D122 Benign neoplasm of ascending colon: Secondary | ICD-10-CM

## 2021-12-06 DIAGNOSIS — K640 First degree hemorrhoids: Secondary | ICD-10-CM | POA: Diagnosis not present

## 2021-12-06 DIAGNOSIS — D124 Benign neoplasm of descending colon: Secondary | ICD-10-CM | POA: Diagnosis not present

## 2021-12-06 MED ORDER — SODIUM CHLORIDE 0.9 % IV SOLN: 500.0000 mL | Freq: Once | INTRAVENOUS | Status: DC

## 2021-12-06 NOTE — Progress Notes (Signed)
Pt's states no medical or surgical changes since previsit or office visit. 

## 2021-12-06 NOTE — Patient Instructions (Signed)
Thank you for letting us take care of your healthcare needs today. PLease see handouts given to you on Polyps, Diverticulosis and Hemorrhoids. Resume Eliquis tomorrow.    YOU HAD AN ENDOSCOPIC PROCEDURE TODAY AT [facility]:   Refer to the procedure report that was given to you for any specific questions about what was found during the examination.  If the procedure report does not answer your questions, please call your gastroenterologist to clarify.  If you requested that your care partner not be given the details of your procedure findings, then the procedure report has been included in a sealed envelope for you to review at your convenience later.  YOU SHOULD EXPECT: Some feelings of bloating in the abdomen. Passage of more gas than usual.  Walking can help get rid of the air that was put into your GI tract during the procedure and reduce the bloating. If you had a lower endoscopy (such as a colonoscopy or flexible sigmoidoscopy) you may notice spotting of blood in your stool or on the toilet paper. If you underwent a bowel prep for your procedure, you may not have a normal bowel movement for a few days.  Please Note:  You might notice some irritation and congestion in your nose or some drainage.  This is from the oxygen used during your procedure.  There is no need for concern and it should clear up in a day or so.  SYMPTOMS TO REPORT IMMEDIATELY:  Following lower endoscopy (colonoscopy or flexible sigmoidoscopy):  Excessive amounts of blood in the stool  Significant tenderness or worsening of abdominal pains  Swelling of the abdomen that is new, acute  Fever of 100F or higher   For urgent or emergent issues, a gastroenterologist can be reached at any hour by calling [phone]. Do not use MyChart messaging for urgent concerns.    DIET:  We do recommend a small meal at first, but then you may proceed to your regular diet.  Drink plenty of fluids but you should  avoid alcoholic beverages for 24 hours.  ACTIVITY:  You should plan to take it easy for the rest of today and you should NOT DRIVE or use heavy machinery until tomorrow (because of the sedation medicines used during the test).    FOLLOW UP: Our staff will call the number listed on your records the next business day following your procedure.  We will call around 7:15- 8:00 am to check on you and address any questions or concerns that you may have regarding the information given to you following your procedure. If we do not reach you, we will leave a message.     If any biopsies were taken you will be contacted by phone or by letter within the next 1-3 weeks.  Please call us at [phone] if you have not heard about the biopsies in 3 weeks.    SIGNATURES/CONFIDENTIALITY: You and/or your care partner have signed paperwork which will be entered into your electronic medical record.  These signatures attest to the fact that that the information above on your After Visit Summary has been reviewed and is understood.  Full responsibility of the confidentiality of this discharge information lies with you and/or your care-partner.

## 2021-12-06 NOTE — Op Note (Signed)
[facility] Patient Name: [name] Procedure Date: 12/06/2021 3:52 PM MRN: [id number] Endoscopist: Gerrit Heck , MD, [id number] Age: 71 Referring MD:  Date of Birth: 17-Nov-1950 Gender: Male Account #: [id number] Procedure:                Colonoscopy Indications:              Screening for colorectal malignant neoplasm                           Last colonsocopy was 10 years ago. Medicines:                Monitored Anesthesia Care Procedure:                Pre-Anesthesia Assessment:                           - Prior to the procedure, a History and Physical                            was performed, and patient medications and                            allergies were reviewed. The patient's tolerance of                            previous anesthesia was also reviewed. The risks                            and benefits of the procedure and the sedation                            options and risks were discussed with the patient.                            All questions were answered, and informed consent                            was obtained. Prior Anticoagulants: The patient has                            taken Eliquis (apixaban), last dose was 2 days                            prior to procedure. ASA Grade Assessment: III - A                            patient with severe systemic disease. After                            reviewing the risks and benefits, the patient was                            deemed in satisfactory condition to undergo the  procedure.                           After obtaining informed consent, the colonoscope                            was passed under direct vision. Throughout the                            procedure, the patient's blood pressure, pulse, and                            oxygen saturations were monitored continuously. The                            CF [id number] #[id number] was introduced through the anus                             and advanced to the the cecum, identified by                            appendiceal orifice and ileocecal valve. The                            colonoscopy was technically difficult and complex                            due to significant looping. Successful completion                            of the procedure was aided by changing the patient                            to a prone position, straightening and shortening                            the scope to obtain bowel loop reduction and                            applying abdominal pressure. The patient tolerated                            the procedure well. The quality of the bowel                            preparation was adequate. The ileocecal valve,                            appendiceal orifice, and rectum were photographed. Scope In: 4:02:03 PM Scope Out: 4:31:37 PM Scope Withdrawal Time: 0 hours 18 minutes 38 seconds  Total Procedure Duration: 0 hours 29 minutes 34 seconds  Findings:                 The perianal and digital rectal examinations were  normal.                           Three sessile polyps were found in the ascending                            colon. The polyps were 3 to 6 mm in size. These                            polyps were removed with a cold snare. Resection                            and retrieval were complete. Estimated blood loss                            was minimal.                           Three sessile polyps were found in the sigmoid                            colon (2) and descending colon. The polyps were 2                            to 4 mm in size. These polyps were removed with a                            cold snare. Resection and retrieval were complete.                            Estimated blood loss was minimal.                           Multiple sessile polyps were found in the rectum.                            The polyps were  1 to 3 mm in size. Several of these                            polyps were removed with a cold snare for                            histologic representative evaluation. Resection and                            retrieval were complete. Estimated blood loss was                            minimal.                           Non-bleeding internal hemorrhoids were found during  retroflexion. The hemorrhoids were small.                           The ascending colon revealed significantly                            excessive looping. Advancing the scope required                            changing the patient to a prone position, using                            manual pressure and straightening and shortening                            the scope to obtain bowel loop reduction.                           Multiple diverticula were found in the ascending                            colon. Complications:            No immediate complications. Estimated Blood Loss:     Estimated blood loss was minimal. Impression:               - Three 3 to 6 mm polyps in the ascending colon,                            removed with a cold snare. Resected and retrieved.                           - Three 2 to 4 mm polyps in the sigmoid colon and                            in the descending colon, removed with a cold snare.                            Resected and retrieved.                           - Multiple 1 to 3 mm polyps in the rectum, removed                            with a cold snare. Resected and retrieved.                           - Non-bleeding internal hemorrhoids.                           - There was significant looping of the colon.                           - Diverticulosis in the ascending colon. Recommendation:           -  Patient has a contact number available for                            emergencies. The signs and symptoms of potential                            delayed  complications were discussed with the                            patient. Return to normal activities tomorrow.                            Written discharge instructions were provided to the                            patient.                           - Resume previous diet.                           - Continue present medications.                           - Await pathology results.                           - Repeat colonoscopy for surveillance based on                            pathology results.                           - Resume Eliquis (apixaban) at prior dose tomorrow.                           - Return to GI clinic PRN. Gerrit Heck, MD 12/06/2021 4:38:42 PM

## 2021-12-06 NOTE — Progress Notes (Unsigned)
To pacu, VSS. Report to [email or web address] 

## 2021-12-06 NOTE — Progress Notes (Unsigned)
GASTROENTEROLOGY PROCEDURE H&P NOTE   Primary Care Physician: Steele Sizer, MD    Reason for Procedure:  Colon Cancer screening  Plan:    Colonoscopy  Patient is appropriate for endoscopic procedure(s) in the ambulatory ([facility]) setting.  The nature of the procedure, as well as the risks, benefits, and alternatives were carefully and thoroughly reviewed with the patient. Ample time for discussion and questions allowed. The patient understood, was satisfied, and agreed to proceed.     HPI: [name] is a 71 y.o. male who presents for colonoscopy for routine Colon Cancer screening.  He was seen in the GI Clinic on 10/29/2021. NO interval change since that appt. Holding Eliquis x2 days for colonoscopy today.    Endoscopic History: - Colonoscopy (02/11/2012): Normal per notes and patient (actual endoscopy report not available for review) - EGD (02/11/2012: Only pathology report available for review and notable for mild chronic, inactive non-H. pylori gastritis  Past Medical History:  Diagnosis Date   Allergic rhinitis    Anemia    BPH (benign prostatic hyperplasia)    Chronic kidney disease    Chronic sinusitis    Complication of anesthesia    work up during surgery 2x in the past    Decreased libido    DVT (deep venous thrombosis) ([place])    left leg after left knee surgery   ED (erectile dysfunction)    Fatigue    Hematuria    HTN (hypertension)    history of   Hypogonadism in male    Hypokalemia    history of    IBS (irritable bowel syndrome)    Low serum vitamin D    Lumbago    Migraine    Mild intermittent asthma    Osteoarthritis of hip    left (bone-on-bone) Dr. Alta Corning & Alvina Filbert. [place], PA-C   Pneumonia    walking pneumonia   Reflux    Shingles    Unilateral inguinal hernia without obstruction or gangrene     Past Surgical History:  Procedure Laterality Date   ADRENALECTOMY Left 02/11/2016   [facility]   COLONOSCOPY  02/2012   normal    JOINT REPLACEMENT     KNEE ARTHROSCOPY Left 10/06/2009   SINUS EXPLORATION     TOTAL HIP ARTHROPLASTY Left 12/14/2017   Procedure: TOTAL HIP ARTHROPLASTY ANTERIOR APPROACH;  Surgeon: Dorna Leitz, MD;  Location: [facility];  Service: Orthopedics;  Laterality: Left;   TOTAL KNEE ARTHROPLASTY Right 06/20/2016   TOTAL KNEE ARTHROPLASTY Right 06/20/2016   Procedure: TOTAL KNEE ARTHROPLASTY;  Surgeon: Dorna Leitz, MD;  Location: [facility];  Service: Orthopedics;  Laterality: Right;   TOTAL KNEE ARTHROPLASTY Left 12/19/2016   Procedure: LEFT TOTAL KNEE ARTHROPLASTY;  Surgeon: Dorna Leitz, MD;  Location: [facility];  Service: Orthopedics;  Laterality: Left;  Adductor Block    Prior to Admission medications   Medication Sig Start Date End Date Taking? Authorizing Provider  azelastine (OPTIVAR) 0.05 % ophthalmic solution PLACE 2 DROPS INTO BOTH EYES 2 (TWO) TIMES DAILY. 12/03/21  Yes Sowles, Drue Stager, MD  famotidine (PEPCID) 20 MG tablet TAKE 1 TABLET BY MOUTH TWICE A DAY 09/09/21  Yes Sowles, Drue Stager, MD  fluticasone (FLONASE) 50 MCG/ACT nasal spray Place 2 sprays into both nostrils daily. 05/14/21  Yes Sowles, Drue Stager, MD  Fluticasone-Umeclidin-Vilant (TRELEGY ELLIPTA) 100-62.5-25 MCG/ACT AEPB Inhale 1 puff into the lungs daily. 10/11/21  Yes Sowles, Drue Stager, MD  montelukast (SINGULAIR) 10 MG tablet TAKE 1 TABLET BY MOUTH EVERY  DAY 08/21/21  Yes Sowles, Drue Stager, MD  Multiple Vitamins-Minerals (MULTIVITAMIN ADULTS 50+ PO) Take 1 tablet by mouth daily. NATURE'S CODE MEN OVER 50 MULTIVITAMIN PACK   Yes [provider]  Polyethyl Glycol-Propyl Glycol 0.4-0.3 % SOLN Place 1-2 drops into both eyes 3 (three) times daily as needed (for dry/irritated eyes.).   Yes [provider]  rosuvastatin (CRESTOR) 40 MG tablet Take 1 tablet (40 mg total) by mouth daily. In place of Atorvastatin for cholesterol 10/11/21  Yes Sowles, Drue Stager, MD  tamsulosin (FLOMAX) 0.4 MG CAPS capsule TAKE 1 CAPSULE BY MOUTH EVERY DAY IN THE  EVENING 10/07/21  Yes Sowles, Drue Stager, MD  tiZANidine (ZANAFLEX) 2 MG tablet Take 1 tablet (2 mg total) by mouth 2 (two) times daily. 05/14/21  Yes Sowles, Drue Stager, MD  VELTASSA 8.4 g packet Take 1 packet by mouth daily. 08/02/20  Yes Lateef, Munso[place], MD  vitamin B-12 (CYANOCOBALAMIN) 500 MCG tablet Take 500 mcg by mouth daily.   Yes [provider]  albuterol (VENTOLIN HFA) 108 (90 Base) MCG/ACT inhaler INHALE 2 PUFFS BY MOUTH EVERY 4 HOURS AS NEEDED FOR WHEEZE OR FOR SHORTNESS OF BREATH 08/01/21   Steele Sizer, MD  apixaban (ELIQUIS) 5 MG TABS tablet Take 1 tablet (5 mg total) by mouth 2 (two) times daily. 10/11/21   Steele Sizer, MD  tadalafil (CIALIS) 5 MG tablet TAKE 1 TABLET BY MOUTH EVERY DAY 10/18/21   Steele Sizer, MD    Current Outpatient Medications  Medication Sig Dispense Refill   azelastine (OPTIVAR) 0.05 % ophthalmic solution PLACE 2 DROPS INTO BOTH EYES 2 (TWO) TIMES DAILY. 18 mL 0   famotidine (PEPCID) 20 MG tablet TAKE 1 TABLET BY MOUTH TWICE A DAY 180 tablet 1   fluticasone (FLONASE) 50 MCG/ACT nasal spray Place 2 sprays into both nostrils daily. 48 mL 1   Fluticasone-Umeclidin-Vilant (TRELEGY ELLIPTA) 100-62.5-25 MCG/ACT AEPB Inhale 1 puff into the lungs daily. 1 each 5   montelukast (SINGULAIR) 10 MG tablet TAKE 1 TABLET BY MOUTH EVERY DAY 90 tablet 1   Multiple Vitamins-Minerals (MULTIVITAMIN ADULTS 50+ PO) Take 1 tablet by mouth daily. NATURE'S CODE MEN OVER 50 MULTIVITAMIN PACK     Polyethyl Glycol-Propyl Glycol 0.4-0.3 % SOLN Place 1-2 drops into both eyes 3 (three) times daily as needed (for dry/irritated eyes.).     rosuvastatin (CRESTOR) 40 MG tablet Take 1 tablet (40 mg total) by mouth daily. In place of Atorvastatin for cholesterol 90 tablet 3   tamsulosin (FLOMAX) 0.4 MG CAPS capsule TAKE 1 CAPSULE BY MOUTH EVERY DAY IN THE EVENING 90 capsule 1   tiZANidine (ZANAFLEX) 2 MG tablet Take 1 tablet (2 mg total) by mouth 2 (two) times daily. 180 tablet 1    VELTASSA 8.4 g packet Take 1 packet by mouth daily.     vitamin B-12 (CYANOCOBALAMIN) 500 MCG tablet Take 500 mcg by mouth daily.     albuterol (VENTOLIN HFA) 108 (90 Base) MCG/ACT inhaler INHALE 2 PUFFS BY MOUTH EVERY 4 HOURS AS NEEDED FOR WHEEZE OR FOR SHORTNESS OF BREATH 8.5 each 0   apixaban (ELIQUIS) 5 MG TABS tablet Take 1 tablet (5 mg total) by mouth 2 (two) times daily. 60 tablet 5   tadalafil (CIALIS) 5 MG tablet TAKE 1 TABLET BY MOUTH EVERY DAY 30 tablet 1   Current Facility-Administered Medications  Medication Dose Route Frequency Provider Last Rate Last Admin   0.9 %  sodium chloride infusion  500 mL Intravenous Once Virgil Lightner, Cecil, DO  Allergies as of 12/06/2021 - Review Complete 12/06/2021  Allergen Reaction Noted   Almond (diagnostic) Other (See Comments) 12/14/2017   Lactose intolerance (gi) Other (See Comments) 06/19/2016   Peanut-containing drug products Other (See Comments) 12/14/2017   Shellfish allergy Other (See Comments) 12/08/2016    Family History  Problem Relation Age of Onset   Diabetes Mother    Heart disease Mother    Lung disease Mother    Seizures Maternal Grandmother    Alzheimer's disease Brother     Social History   Socioeconomic History   Marital status: Married    Spouse name: deborah   Number of children: 2   Years of education: Not on file   Highest education level: Professional school degree (e.g., MD, DDS, DVM, JD)  Occupational History   Occupation: [employer]  Tobacco Use   Smoking status: Former    Packs/day: 1.00    Years: 10.00    Total pack years: 10.00    Types: Cigarettes    Quit date: 1980    Years since quitting: 43.9   Smokeless tobacco: Never   Tobacco comments:    59 years ago 15 when he stopped  Vaping Use   Vaping Use: Never used  Substance and Sexual Activity   Alcohol use: No    Alcohol/week: 0.0 standard drinks of alcohol   Drug use: No   Sexual activity: Yes    Partners: Female   Other Topics Concern   Not on file  Social History Narrative   Patient has 8 grands   Social Determinants of Health   Financial Resource Strain: Low Risk  (09/19/2021)   Overall Financial Resource Strain (CARDIA)    Difficulty of Paying Living Expenses: Not hard at all  Food Insecurity: No Food Insecurity (09/19/2021)   Hunger Vital Sign    Worried About Running Out of Food in the Last Year: Never true    [place] in the Last Year: Never true  Transportation Needs: No Transportation Needs (09/19/2021)   PRAPARE - Hydrologist (Medical): No    Lack of Transportation (Non-Medical): No  Physical Activity: Insufficiently Active (09/19/2021)   Exercise Vital Sign    Days of Exercise per Week: 2 days    Minutes of Exercise per Session: 30 min  Stress: No Stress Concern Present (09/19/2021)   [place]    Feeling of Stress : Not at all  Social Connections: [place] (09/19/2021)   Social Connection and Isolation Panel [NHANES]    Frequency of Communication with Friends and Family: Three times a week    Frequency of Social Gatherings with Friends and Family: Once a week    Attends Religious Services: More than 4 times per year    Active Member of [employer] or Organizations: Yes    Attends Archivist Meetings: 1 to 4 times per year    Marital Status: Married  Human resources officer Violence: Not At Risk (09/19/2021)   Humiliation, Afraid, Rape, and Kick questionnaire    Fear of Current or Ex-Partner: No    Emotionally Abused: No    Physically Abused: No    Sexually Abused: No    Physical Exam: Vital signs in last 24 hours: '@BP'$  135/73   Pulse 60   Temp 98.1 F (36.7 C)   Ht '6\' 2"'$  (1.88 m)   Wt 294 lb (133.4 kg)   SpO2 98%   BMI  37.75 kg/m  GEN: NAD EYE: Sclerae anicteric ENT: MMM CV: Non-tachycardic Pulm: CTA b/l GI: Soft, NT/ND NEURO:  Alert & Oriented x  3   Gerrit Heck, DO [facility]   12/06/2021 3:53 PM

## 2021-12-09 ENCOUNTER — Telehealth: Payer: Self-pay | Admitting: *Deleted

## 2021-12-09 NOTE — Telephone Encounter (Signed)
  Follow up Call-     12/06/2021    3:29 PM  Call back number  Post procedure Call Back phone  # [phone]  Permission to leave phone message Yes     Patient questions:  Do you have a fever, pain , or abdominal swelling? No. Pain Score  0 *  Have you tolerated food without any problems? Yes.    Have you been able to return to your normal activities? Yes.    Do you have any questions about your discharge instructions: Diet   No. Medications  No. Follow up visit  No.  Do you have questions or concerns about your Care? No.  Actions: * If pain score is 4 or above: No action needed, pain <4.

## 2021-12-16 ENCOUNTER — Encounter: Payer: Self-pay | Admitting: Gastroenterology

## 2021-12-19 ENCOUNTER — Telehealth: Payer: Self-pay | Admitting: Family Medicine

## 2021-12-19 NOTE — Telephone Encounter (Signed)
Patient's wife called in states patient was told at appt that medication would be changed from the Atorvastatin 40 mg , not showing anything about this. Please call back to discuss

## 2021-12-20 ENCOUNTER — Other Ambulatory Visit: Payer: Self-pay | Admitting: Family Medicine

## 2021-12-20 DIAGNOSIS — N529 Male erectile dysfunction, unspecified: Secondary | ICD-10-CM

## 2021-12-20 DIAGNOSIS — J4540 Moderate persistent asthma, uncomplicated: Secondary | ICD-10-CM

## 2021-12-20 MED ORDER — TADALAFIL 5 MG PO TABS: 5.0000 mg | ORAL_TABLET | Freq: Every day | ORAL | 1 refills | Status: DC

## 2021-12-20 MED ORDER — TADALAFIL 5 MG PO TABS: 5.0000 mg | ORAL_TABLET | ORAL | 1 refills | Status: DC | PRN

## 2021-12-25 DIAGNOSIS — E875 Hyperkalemia: Secondary | ICD-10-CM | POA: Diagnosis not present

## 2021-12-25 DIAGNOSIS — N2581 Secondary hyperparathyroidism of renal origin: Secondary | ICD-10-CM | POA: Diagnosis not present

## 2021-12-25 DIAGNOSIS — D631 Anemia in chronic kidney disease: Secondary | ICD-10-CM | POA: Diagnosis not present

## 2021-12-25 DIAGNOSIS — N1831 Chronic kidney disease, stage 3a: Secondary | ICD-10-CM | POA: Diagnosis not present

## 2021-12-28 ENCOUNTER — Other Ambulatory Visit: Payer: Self-pay | Admitting: Family Medicine

## 2021-12-28 DIAGNOSIS — J302 Other seasonal allergic rhinitis: Secondary | ICD-10-CM

## 2022-01-01 ENCOUNTER — Encounter: Payer: Self-pay | Admitting: Family Medicine

## 2022-01-02 NOTE — Progress Notes (Signed)
Name: [name]   MRN: [id number]    DOB: 1950/11/03   Date:01/03/2022       Progress Note  Subjective  Chief Complaint  Follow Up  HPI  Morbid Obese: he was up to 303 lbs May 2020 and has been fluctuating since, but last visit with me 08/2020 it was 298 lbs it went down to  289 lbs in May 2023 , today is 299 lbs . He retired but is back to work. Discussed importance of eating more fiber and getting more physical activity    Atherosclerosis of aorta: on Eliquis and Crestor since Sep 2023 , goal LDL is below 70 , we will recheck it next visit    Asthma Moderate: he is using Trelegy and singulair daily, he also drinks garlic/ginger/lemon/cayenne and honey tea, no longer using rescue inhaler daily He states symptoms are controlled, no cough, wheezing or SOB   History of anemia: but seen by hematologist level back in Nov 2019 and Hgb  was  11.5 . Had blood transfusion after his left hip replacement surgery Dec 2020. He  now has anemia of chronic disease  HCT has been stable. Recently done by Dr. Catha Nottingham stage III: under the care of Dr. Holley Raring. No pruritus. 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 Anemia of chronic disease is stable. He has a history of secondary hyperparathyroidism. Last GFR was 47    Dyslipidemia: he is not sure if he is taking Atorvastatin, last LDL  still not at goal at 85, we changed to Crestor during his last visit and we will recheck level next visit    Chronic DVT: on Eliquis, no side effects of medication , no easy bruising. No leg pain but occasionally has some swelling on left lower leg    GERD and eructation: he is doing well on Pepcid No heartburn or indigestion   Colon Polyp: multiple small polyps, discussed importance of high fiber diet    BPH/ED:  He is taking medications, Cialis 5 mg was not working for ED, we added 10 mg but it made him flush so we will change  to 5 mg to take one daily and add extra 5 mg every 3 days prn intercourse and takes Flomax . Stable    AR: he has been taking singulair, Allegra 180 mg daily also uses flonase  , he is no longer taking Allegra D. 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/2020   Osteoarthritis, multiple sites 06/16/2019   Morbid obesity ([place]) 06/16/2019   Atherosclerosis of abdominal aorta ([place]) 09/15/2017   Chronic deep vein thrombosis (DVT) of left popliteal vein ([place]) 06/26/2017   History of benign neoplasm of adrenal gland 02/11/2016   History of iron deficiency anemia 10/10/2015   BPH (benign prostatic hyperplasia) 06/20/2015   ED (erectile dysfunction) 06/20/2015   Allergic rhinitis, seasonal 06/20/2015   Anemia of chronic disease 06/20/2015   Hypogonadism in male 06/20/2015   Asthma, well controlled, moderate persistent 06/20/2015   Hyperglycemia 06/20/2015   History of shingles 06/20/2015   GERD without esophagitis 06/20/2015   Chronic radicular low back pain 06/20/2015   History of epilepsy 06/20/2015   Migraine without aura and without status migrainosus, not intractable 06/20/2015   Dyslipidemia 06/20/2015   Primary osteoarthritis of both knees 06/20/2015    Past Surgical History:  Procedure Laterality Date   ADRENALECTOMY Left 02/11/2016   [facility]   COLONOSCOPY  02/2012   normal   JOINT REPLACEMENT     KNEE ARTHROSCOPY Left 10/06/2009   SINUS EXPLORATION     TOTAL HIP ARTHROPLASTY Left 12/14/2017   Procedure: TOTAL HIP ARTHROPLASTY ANTERIOR APPROACH;  Surgeon: Dorna Leitz, MD;  Location: [facility];  Service: Orthopedics;  Laterality: Left;   TOTAL KNEE ARTHROPLASTY Right 06/20/2016   TOTAL KNEE ARTHROPLASTY Right 06/20/2016   Procedure: TOTAL KNEE ARTHROPLASTY;  Surgeon: Dorna Leitz, MD;  Location: [facility];  Service: Orthopedics;  Laterality: Right;   TOTAL KNEE ARTHROPLASTY Left 12/19/2016   Procedure: LEFT TOTAL KNEE ARTHROPLASTY;   Surgeon: Dorna Leitz, MD;  [facility];  Service: Orthopedics;  Laterality: Left;  Adductor Block    Family History  Problem Relation Age of Onset   Diabetes Mother    Heart disease Mother    Lung disease Mother    Seizures Maternal Grandmother    Alzheimer's disease Brother     Social History   Tobacco Use   Smoking status: Former    Packs/day: 1.00    Years: 10.00    Total pack years: 10.00    Types: Cigarettes    Quit date: 1980    Years since quitting: 44.0   Smokeless tobacco: Never   Tobacco comments:    38 years ago 28 when he stopped  Substance Use Topics   Alcohol use: No    Alcohol/week: 0.0 standard drinks of alcohol     Current Outpatient Medications:    apixaban (ELIQUIS) 5 MG TABS tablet, Take 1 tablet (5 mg total) by mouth 2 (two) times daily., Disp: 60 tablet, Rfl: 5   atorvastatin (LIPITOR) 40 MG tablet, Take 40 mg by mouth daily., Disp: , Rfl:    azelastine (OPTIVAR) 0.05 % ophthalmic solution, PLACE 2 DROPS INTO BOTH EYES 2 (TWO) TIMES DAILY., Disp: 54 mL, Rfl: 1   famotidine (PEPCID) 20 MG tablet, TAKE 1 TABLET BY MOUTH TWICE A DAY, Disp: 180 tablet, Rfl: 1   fluticasone (FLONASE) 50 MCG/ACT nasal spray, Place 2 sprays into both nostrils daily., Disp: 48 mL, Rfl: 1   Fluticasone-Umeclidin-Vilant (TRELEGY ELLIPTA) 100-62.5-25 MCG/ACT AEPB, Inhale 1 puff into the lungs daily., Disp: 1 each, Rfl: 5   montelukast (SINGULAIR) 10 MG tablet, TAKE 1 TABLET BY MOUTH EVERY DAY, Disp: 90 tablet, Rfl: 1   Multiple Vitamins-Minerals (MULTIVITAMIN ADULTS 50+ PO), Take 1 tablet by mouth daily. NATURE'S CODE MEN OVER 50 MULTIVITAMIN PACK, Disp: , Rfl:    Polyethyl Glycol-Propyl Glycol 0.4-0.3 % SOLN, Place 1-2 drops into both eyes 3 (three) times daily as needed (for dry/irritated eyes.)., Disp: , Rfl:    rosuvastatin (CRESTOR) 40 MG tablet, Take 1 tablet (40 mg total) by mouth daily. In place of Atorvastatin for cholesterol, Disp: 90 tablet, Rfl: 3   tadalafil  (CIALIS) 5 MG tablet, Take 1 tablet (5 mg total) by mouth daily., Disp: 90 tablet, Rfl: 1   tadalafil (CIALIS) 5 MG tablet, Take 1 tablet (5 mg total) by mouth every three (3) days as needed for erectile dysfunction., Disp: 30 tablet, Rfl: 1   tamsulosin (FLOMAX) 0.4 MG CAPS capsule, TAKE 1 CAPSULE BY MOUTH EVERY DAY IN THE EVENING, Disp: 90 capsule, Rfl: 1   tiZANidine (ZANAFLEX) 2 MG tablet, Take 1 tablet (2 mg total) by mouth 2 (two) times daily., Disp: 180 tablet, Rfl: 1   VELTASSA 8.4 g packet, Take 1 packet by  mouth daily., Disp: , Rfl:    vitamin B-12 (CYANOCOBALAMIN) 500 MCG tablet, Take 500 mcg by mouth daily., Disp: , Rfl:    albuterol (VENTOLIN HFA) 108 (90 Base) MCG/ACT inhaler, INHALE 2 PUFFS BY MOUTH EVERY 4 HOURS AS NEEDED FOR WHEEZE OR FOR SHORTNESS OF BREATH (Patient not taking: Reported on 01/03/2022), Disp: 8.5 each, Rfl: 0  Allergies  Allergen Reactions   Almond (Diagnostic) Other (See Comments)    Migraines   Lactose Intolerance (Gi) Other (See Comments)    MIGRAINES   Peanut-Containing Drug Products Other (See Comments)    Migraines   Shellfish Allergy Other (See Comments)    Congestion/breathing problems/migraines.    I personally reviewed active problem list, medication list, allergies, family history, social history, health maintenance with the patient/caregiver today.   ROS  Constitutional: Negative for fever or weight change.  Respiratory: Negative for cough and shortness of breath.   Cardiovascular: Negative for chest pain or palpitations.  Gastrointestinal: Negative for abdominal pain, no bowel changes.  Musculoskeletal: Negative for gait problem or joint swelling.  Skin: Negative for rash.  Neurological: Negative for dizziness or headache.  No other specific complaints in a complete review of systems (except as listed in HPI above).   Objective  Vitals:   01/03/22 0825  BP: 126/80  Pulse: 89  Resp: 18  Temp: 97.9 F (36.6 C)  TempSrc: Oral   SpO2: 93%  Weight: 299 lb 11.2 oz (135.9 kg)  Height: '6\' 2"'$  (1.88 m)    Body mass index is 38.48 kg/m.  Physical Exam  Constitutional: Patient appears well-developed and well-nourished. Obese  No distress.  HEENT: head atraumatic, normocephalic, pupils equal and reactive to light, neck supple Cardiovascular: Normal rate, regular rhythm and normal heart sounds.  No murmur heard. No BLE edema. Pulmonary/Chest: Effort normal and breath sounds normal. No respiratory distress. Abdominal: Soft.  There is no tenderness. Psychiatric: Patient has a normal mood and affect. behavior is normal. Judgment and thought content normal.   Recent Results (from the past 2160 hour(s))  Surgical pathology     Status: None   Collection Time: 10/11/21  1:23 PM  Result Value Ref Range   SURGICAL PATHOLOGY      SURGICAL PATHOLOGY CASE: [id number] PATIENT: Eugenia Pancoast Surgical Pathology Report     Clinical History: back skin lesion (cm)     FINAL MICROSCOPIC DIAGNOSIS:  A.   SKIN, LEFT BACK, EXCISION: PIGMENTED SEBORRHEIC KERATOSIS  MICROSCOPIC DESCRIPTION: There is acanthosis with hyperpigmented keratinocytic proliferation without atypia. This is a pigmented seborrheic keratosis.    GROSS DESCRIPTION:  Received in formalin is a 0.8 cm in diameter tan-white skin shave which has a central 0.5 cm in diameter dark brown-black well-defined macule. The skin is focally disrupted at the edge nearest the macule. The specimen is inked, bisected and submitted in 1 block.  SW 10/14/2021   Final Diagnosis performed by Vallarie Mare, MD.   Electronically signed 10/18/2021 Technical component performed at [facility]. [facility], [place] [address] [place], [place] [address].  Professional component performed at [facility]. [address] [place], [place], [place] [address].  Immunohistochemistry Technical component (if applicable) was performed at  [facility]. [address], [place], [place], [place] [address].   IMMUNOHISTOCHEMISTRY DISCLAIMER (if applicable): Some of these immunohistochemical stains may have been developed and the performance characteristics determine by [facility]. Some may not have been cleared or approved by the U.S. Food and Drug  Administration. The FDA has determined that such clearance or approval is not necessary. This test is used for clinical purposes. It should not be regarded as investigational or for research. This laboratory is certified under the [place] (CLIA-88) as qualified to perform high complexity clinical laboratory testing.  The controls stained appropriately.     PHQ2/9:    01/03/2022    8:26 AM 10/11/2021    1:03 PM 09/19/2021    8:47 AM 05/14/2021    2:35 PM 03/11/2021   12:48 PM  Depression screen PHQ 2/9  Decreased Interest 0 0 0 0 0  Down, Depressed, Hopeless 0 0 0 0 0  PHQ - 2 Score 0 0 0 0 0  Altered sleeping 0 0 0 0 0  Tired, decreased energy 0 0 0 0 0  Change in appetite 0 0 0 0 0  Feeling bad or failure about yourself  0 0 0 0 0  Trouble concentrating 0 0 0 0 0  Moving slowly or fidgety/restless 0 0 0 0 0  Suicidal thoughts 0 0 0 0 0  PHQ-9 Score 0 0 0 0 0  Difficult doing work/chores     Not difficult at all    phq 9 is negative   Fall Risk:    01/03/2022    8:26 AM 10/11/2021    1:03 PM 09/19/2021    8:47 AM 05/14/2021    2:35 PM 03/11/2021   12:48 PM  Fall Risk   Falls in the past year? 0 0 0 0 0  Number falls in past yr:  0 0 0 0  Injury with Fall?  0 0 0 0  Risk for fall due to : No Fall Risks No Fall Risks No Fall Risks No Fall Risks No Fall Risks  Follow up Falls prevention discussed;Education provided;Falls evaluation completed Falls prevention discussed Falls prevention discussed Falls prevention discussed Falls prevention discussed      Functional Status Survey: Is the patient  deaf or have difficulty hearing?: No Does the patient have difficulty seeing, even when wearing glasses/contacts?: No Does the patient have difficulty concentrating, remembering, or making decisions?: No Does the patient have difficulty walking or climbing stairs?: No Does the patient have difficulty dressing or bathing?: No Does the patient have difficulty doing errands alone such as visiting a doctor's office or shopping?: No    Assessment & Plan  1. Atherosclerosis of abdominal aorta (HCC)  On statin therapy and Eliquis   2. Chronic kidney disease, stage 3a ([place])  Continue follow up with nephrologist   3. Morbid obesity ([place])  Discussed with the patient the risk posed by an increased BMI. Discussed importance of portion control, calorie counting and at least 150 minutes of physical activity weekly. Avoid sweet beverages and drink more water. Eat at least 6 servings of fruit and vegetables daily    4. Chronic deep vein thrombosis (DVT) of calf muscle vein of left lower extremity (HCC)  Continue Eliquis   5. Secondary hyperparathyroidism ([place])  Stable  6. Asthma, well controlled, moderate persistent  Controlled   7. Dyslipidemia  On Crestor   8. Gastroesophageal reflux disease without esophagitis  Doing well   9. Anemia of chronic disease  Stable   10. Seasonal allergic rhinitis due to pollen  - fluticasone (FLONASE) 50 MCG/ACT nasal spray; Place 2 sprays into both nostrils daily.  Dispense: 48 mL; Refill: 1  11. Intermittent low back pain  - tiZANidine (ZANAFLEX) 2 MG  tablet; Take 1 tablet (2 mg total) by mouth 2 (two) times daily as needed for muscle spasms.  Dispense: 180 tablet; Refill: 1

## 2022-01-03 ENCOUNTER — Ambulatory Visit (INDEPENDENT_AMBULATORY_CARE_PROVIDER_SITE_OTHER): Payer: Medicare Other | Admitting: Family Medicine

## 2022-01-03 ENCOUNTER — Encounter: Payer: Self-pay | Admitting: Family Medicine

## 2022-01-03 VITALS — BP 126/80 | HR 89 | Temp 97.9°F | Resp 18 | Ht 74.0 in | Wt 299.7 lb

## 2022-01-03 DIAGNOSIS — J4540 Moderate persistent asthma, uncomplicated: Secondary | ICD-10-CM | POA: Diagnosis not present

## 2022-01-03 DIAGNOSIS — N1831 Chronic kidney disease, stage 3a: Secondary | ICD-10-CM | POA: Diagnosis not present

## 2022-01-03 DIAGNOSIS — I700 Atherosclerosis of aorta: Secondary | ICD-10-CM | POA: Diagnosis not present

## 2022-01-03 DIAGNOSIS — N2581 Secondary hyperparathyroidism of renal origin: Secondary | ICD-10-CM | POA: Diagnosis not present

## 2022-01-03 DIAGNOSIS — M150 Primary generalized (osteo)arthritis: Secondary | ICD-10-CM

## 2022-01-03 DIAGNOSIS — J301 Allergic rhinitis due to pollen: Secondary | ICD-10-CM | POA: Diagnosis not present

## 2022-01-03 DIAGNOSIS — D638 Anemia in other chronic diseases classified elsewhere: Secondary | ICD-10-CM

## 2022-01-03 DIAGNOSIS — M159 Polyosteoarthritis, unspecified: Secondary | ICD-10-CM | POA: Diagnosis not present

## 2022-01-03 DIAGNOSIS — E785 Hyperlipidemia, unspecified: Secondary | ICD-10-CM

## 2022-01-03 DIAGNOSIS — M545 Low back pain, unspecified: Secondary | ICD-10-CM

## 2022-01-03 DIAGNOSIS — K219 Gastro-esophageal reflux disease without esophagitis: Secondary | ICD-10-CM

## 2022-01-03 DIAGNOSIS — I82562 Chronic embolism and thrombosis of left calf muscular vein: Secondary | ICD-10-CM | POA: Diagnosis not present

## 2022-01-03 MED ORDER — TIZANIDINE HCL 2 MG PO TABS: 2.0000 mg | ORAL_TABLET | Freq: Two times a day (BID) | ORAL | 1 refills | Status: DC | PRN

## 2022-01-03 MED ORDER — FLUTICASONE PROPIONATE 50 MCG/ACT NA SUSP: 2.0000 | Freq: Every day | NASAL | 1 refills | Status: DC

## 2022-01-27 ENCOUNTER — Telehealth: Payer: Self-pay | Admitting: Family Medicine

## 2022-01-27 NOTE — Telephone Encounter (Signed)
Patient declined AWV today 

## 2022-01-30 ENCOUNTER — Encounter: Payer: Self-pay | Admitting: Internal Medicine

## 2022-01-30 ENCOUNTER — Ambulatory Visit (INDEPENDENT_AMBULATORY_CARE_PROVIDER_SITE_OTHER): Payer: Medicare Other | Admitting: Internal Medicine

## 2022-01-30 ENCOUNTER — Ambulatory Visit
Admission: RE | Admit: 2022-01-30 | Discharge: 2022-01-30 | Disposition: A | Discharge status: Home / Self Care | Payer: Medicare Other | Attending: Internal Medicine | Admitting: Internal Medicine

## 2022-01-30 ENCOUNTER — Ambulatory Visit
Admission: RE | Admit: 2022-01-30 | Discharge: 2022-01-30 | Disposition: A | Discharge status: Home / Self Care | Payer: Medicare Other | Source: Ambulatory Visit | Attending: Internal Medicine | Admitting: Internal Medicine

## 2022-01-30 VITALS — BP 122/78 | HR 93 | Temp 98.3°F | Resp 18 | Ht 74.0 in | Wt 299.2 lb

## 2022-01-30 DIAGNOSIS — R0602 Shortness of breath: Secondary | ICD-10-CM

## 2022-01-30 DIAGNOSIS — J069 Acute upper respiratory infection, unspecified: Secondary | ICD-10-CM | POA: Insufficient documentation

## 2022-01-30 DIAGNOSIS — R051 Acute cough: Secondary | ICD-10-CM

## 2022-01-30 DIAGNOSIS — R197 Diarrhea, unspecified: Secondary | ICD-10-CM | POA: Diagnosis not present

## 2022-01-30 DIAGNOSIS — R519 Headache, unspecified: Secondary | ICD-10-CM | POA: Diagnosis not present

## 2022-01-30 DIAGNOSIS — R059 Cough, unspecified: Secondary | ICD-10-CM | POA: Diagnosis not present

## 2022-01-30 LAB — POCT INFLUENZA A/B
Influenza A, POC: NEGATIVE
Influenza B, POC: NEGATIVE

## 2022-01-30 MED ORDER — PREDNISONE 20 MG PO TABS: 40.0000 mg | ORAL_TABLET | Freq: Every day | ORAL | 0 refills | Status: AC

## 2022-01-30 MED ORDER — BENZONATATE 100 MG PO CAPS: 100.0000 mg | ORAL_CAPSULE | Freq: Two times a day (BID) | ORAL | 0 refills | Status: DC | PRN

## 2022-01-30 NOTE — Patient Instructions (Signed)
It was great seeing you today!  Plan discussed at today's visit: -COVID test pending -Chest x-ray today -Steroids sent to pharmacy as well as cough suppressant to take at night. Take Mucinex OTC in the morning to help with chest congestion. -Work note given  Follow up in: as needed  Take care and let us know if you have any questions or concerns prior to your next visit.  Dr. Rosana Berger

## 2022-01-30 NOTE — Progress Notes (Signed)
Acute Office Visit  Subjective:     Patient ID: [name], male    DOB: Dec 26, 1950, 72 y.o.   MRN: [id number]  Chief Complaint  Patient presents with   URI    Cough, headache and diarrhea    HPI Patient is in today for cough, congestion and diarrhea. Symptoms started 5 days ago. Had diarrhea on Monday and missed work for the last 3 days. Does have asthma, uses Trelegy daily and Albuterol PRN but hasn't been using lately.   URI Compliant:  -Fever: no -Cough: yes, dry  -Shortness of breath: yes -Wheezing: yes -Chest pain: no -Chest tightness: yes -Chest congestion: yes -Nasal congestion: no -Runny nose: no -Post nasal drip: no -Sore throat: yes -Sinus pressure: no -Headache: yes with coughing  -Face pain: no -Ear pain: no  -Ear pressure: no  -Vomiting: no, diarrhea a few days ago -Fatigue: yes -Sick contacts: yes - whole family, started with wife last week  -Context: fluctuating -Relief with OTC cold/cough medications: no  -Treatments attempted: cold/sinus, Trelegy and Albuterol PRN    Review of Systems  Constitutional:  Positive for malaise/fatigue. Negative for chills and fever.  HENT:  Positive for sore throat. Negative for congestion, nosebleeds, sinus pain and tinnitus.   Respiratory:  Positive for cough and shortness of breath. Negative for sputum production.   Cardiovascular:  Negative for chest pain.  Gastrointestinal:  Positive for diarrhea. Negative for abdominal pain, nausea and vomiting.  Neurological:  Positive for headaches.        Objective:    BP 122/78   Pulse 93   Temp 98.3 F (36.8 C)   Resp 18   Ht '6\' 2"'$  (1.88 m)   Wt 299 lb 3.2 oz (135.7 kg)   SpO2 94%   BMI 38.42 kg/m  BP Readings from Last 3 Encounters:  01/30/22 122/78  01/03/22 126/80  12/06/21 111/75   Wt Readings from Last 3 Encounters:  01/30/22 299 lb 3.2 oz (135.7 kg)  01/03/22 299 lb 11.2 oz (135.9 kg)  12/06/21 294 lb (133.4 kg)      Physical  Exam Constitutional:      Appearance: Normal appearance. He is ill-appearing.  HENT:     Head: Normocephalic and atraumatic.     Right Ear: Ear canal and external ear normal.     Left Ear: Ear canal and external ear normal.     Ears:     Comments: Bilateral eustachian tube dyfunction    Nose: Nose normal.     Mouth/Throat:     Mouth: Mucous membranes are moist.     Comments: Mild PND Eyes:     Conjunctiva/sclera: Conjunctivae normal.  Cardiovascular:     Rate and Rhythm: Normal rate and regular rhythm.  Pulmonary:     Effort: Pulmonary effort is normal.     Breath sounds: Wheezing present.     Comments: Inspiratory and expiratory wheezes throughout  Skin:    General: Skin is warm and dry.  Neurological:     General: No focal deficit present.     Mental Status: He is alert. Mental status is at baseline.  Psychiatric:        Mood and Affect: Mood normal.        Behavior: Behavior normal.     No results found for any visits on 01/30/22.      Assessment & Plan:   1. Viral upper respiratory tract infection/Shortness of breath/Acute cough/Nonintractable headache, unspecified chronicity pattern, unspecified headache type:  Symptoms consistent with viral illness, flu negative but COVID pending. History of asthma with worsening respiratory symptoms, wheezing on exam. He was sent for chest x-ray to rule out PNA. Treat with Prednisone 40 mg x 5 days, cough suppressant and Mucinex. Continue inhalers, can use Albuterol PRN. Work note given to return to work on Monday as long as symptoms improve.   - DG Chest 2 View; Future - predniSONE (DELTASONE) 20 MG tablet; Take 2 tablets (40 mg total) by mouth daily with breakfast for 5 days.  Dispense: 10 tablet; Refill: 0 - benzonatate (TESSALON) 100 MG capsule; Take 1 capsule (100 mg total) by mouth 2 (two) times daily as needed for cough.  Dispense: 20 capsule; Refill: 0 - POCT Influenza A/B - Novel Coronavirus, NAA ([facility])   Return if  symptoms worsen or fail to improve.  Teodora Medici, DO

## 2022-01-31 LAB — NOVEL CORONAVIRUS, NAA: SARS-CoV-2, NAA: NOT DETECTED

## 2022-02-06 ENCOUNTER — Encounter: Payer: Self-pay | Admitting: Oncology

## 2022-04-11 ENCOUNTER — Other Ambulatory Visit: Payer: Self-pay | Admitting: Family Medicine

## 2022-04-11 DIAGNOSIS — K219 Gastro-esophageal reflux disease without esophagitis: Secondary | ICD-10-CM

## 2022-04-18 ENCOUNTER — Other Ambulatory Visit: Payer: Self-pay | Admitting: Family Medicine

## 2022-04-18 DIAGNOSIS — N138 Other obstructive and reflux uropathy: Secondary | ICD-10-CM

## 2022-05-19 DIAGNOSIS — D631 Anemia in chronic kidney disease: Secondary | ICD-10-CM | POA: Diagnosis not present

## 2022-05-19 DIAGNOSIS — N2581 Secondary hyperparathyroidism of renal origin: Secondary | ICD-10-CM | POA: Diagnosis not present

## 2022-05-19 DIAGNOSIS — N1831 Chronic kidney disease, stage 3a: Secondary | ICD-10-CM | POA: Diagnosis not present

## 2022-05-19 DIAGNOSIS — E875 Hyperkalemia: Secondary | ICD-10-CM | POA: Diagnosis not present

## 2022-06-12 ENCOUNTER — Other Ambulatory Visit: Payer: Self-pay | Admitting: Family Medicine

## 2022-06-12 DIAGNOSIS — J4540 Moderate persistent asthma, uncomplicated: Secondary | ICD-10-CM

## 2022-06-17 NOTE — Progress Notes (Unsigned)
Name: [name]   MRN: [id number]    DOB: 06/28/1950   Date:06/18/2022       Progress Note  Subjective  Chief Complaint  Follow Up  HPI  Morbid Obese: he was up to 303 lbs May 2020 and has been fluctuating since, but last visit with me 08/2020 it was 298 lbs it went down to  289 lbs in May 2023 , up to  299 lbs he did a two week fast and lost 10 lbs in 2 weeks , today weight is 289 lbs   Atherosclerosis of aorta: on Eliquis and Crestor since Sep 2023 , goal LDL is below 70 , last LDL went from 100 to 85   Asthma Moderate: he is using Trelegy and singulair daily, he was using rescue inhaler daily until his recent weight loss.  He states symptoms are controlled, no cough, wheezing or SOB   History of anemia: but seen by hematologist level back in Nov 2019 and Hgb  was  11.5 , he had recent labs done in May 2024 and is 10.8 . He has anemia of chronic disease, level is monitored to Dr. Karsten Ro stage III: under the care of Dr. Cherylann Ratel. No pruritus. 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 Anemia of chronic disease is stable. He has a history of secondary hyperparathyroidism. Last GFR was up from 47 to 51    Dyslipidemia: he is not sure if he is taking Atorvastatin, last LDL  still not at goal at 85, we changed to Crestor during his last visit and we will recheck level next visit    Chronic DVT: on Eliquis, no side effects of medication , no easy bruising. No leg pain but occasionally has some swelling on left lower leg . He has easy bleeding, from nose or sometimes from shaving. Resolves with pressure    GERD and eructation: he is doing well on Pepcid No heartburn or indigestion Stable    BPH/ED:  He is taking medications, Cialis 5 mg was not working for ED, we added 10 mg but it made him flush so we will change to 5 mg to take one daily and add extra 5 mg every 3 days prn intercourse and takes  Flomax . Unchanged    AR: he has been taking singulair, Allegra 180 mg daily also uses flonase  , he is no longer taking Allegra D. 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) 06/26/2017   History of benign neoplasm of adrenal gland 02/11/2016   History of iron deficiency anemia 10/10/2015   BPH (benign prostatic hyperplasia) 06/20/2015   ED (erectile dysfunction) 06/20/2015   Allergic rhinitis, seasonal 06/20/2015   Anemia of chronic disease 06/20/2015   Hypogonadism in male 06/20/2015   Asthma, well controlled, moderate persistent 06/20/2015   Hyperglycemia 06/20/2015   History of shingles 06/20/2015   GERD without esophagitis 06/20/2015   Chronic radicular low back pain 06/20/2015   History of epilepsy 06/20/2015   Migraine without aura and without status migrainosus, not intractable 06/20/2015   Dyslipidemia 06/20/2015   Primary osteoarthritis of both knees 06/20/2015    Past Surgical History:  Procedure Laterality Date   ADRENALECTOMY Left 02/11/2016   [facility]   COLONOSCOPY  02/2012   normal   JOINT REPLACEMENT     KNEE ARTHROSCOPY Left 10/06/2009   SINUS EXPLORATION     TOTAL HIP ARTHROPLASTY Left 12/14/2017   Procedure: TOTAL HIP ARTHROPLASTY ANTERIOR APPROACH;  Surgeon: Jodi Geralds, MD;  Location: [facility] OR;  Service: Orthopedics;  Laterality: Left;   TOTAL KNEE ARTHROPLASTY Right 06/20/2016   TOTAL KNEE ARTHROPLASTY Right 06/20/2016   Procedure: TOTAL KNEE ARTHROPLASTY;  Surgeon: Jodi Geralds, MD;  Location: [facility] OR;  Service: Orthopedics;  Laterality: Right;   TOTAL KNEE ARTHROPLASTY Left 12/19/2016   Procedure: LEFT TOTAL KNEE ARTHROPLASTY;  Surgeon: Jodi Geralds, MD;  Location: [facility];  Service: Orthopedics;   Laterality: Left;  Adductor Block    Family History  Problem Relation Age of Onset   Diabetes Mother    Heart disease Mother    Lung disease Mother    Seizures Maternal Grandmother    Alzheimer's disease Brother     Social History   Tobacco Use   Smoking status: Former    Packs/day: 1.00    Years: 10.00    Additional pack years: 0.00    Total pack years: 10.00    Types: Cigarettes    Quit date: 1980    Years since quitting: 44.4   Smokeless tobacco: Never   Tobacco comments:    39 years ago 50 when he stopped  Substance Use Topics   Alcohol use: No    Alcohol/week: 0.0 standard drinks of alcohol     Current Outpatient Medications:    albuterol (VENTOLIN HFA) 108 (90 Base) MCG/ACT inhaler, INHALE 2 PUFFS BY MOUTH EVERY 4 HOURS AS NEEDED FOR WHEEZE OR FOR SHORTNESS OF BREATH, Disp: 8.5 each, Rfl: 0   apixaban (ELIQUIS) 5 MG TABS tablet, Take 1 tablet (5 mg total) by mouth 2 (two) times daily., Disp: 60 tablet, Rfl: 5   azelastine (OPTIVAR) 0.05 % ophthalmic solution, PLACE 2 DROPS INTO BOTH EYES 2 (TWO) TIMES DAILY., Disp: 54 mL, Rfl: 1   benzonatate (TESSALON) 100 MG capsule, Take 1 capsule (100 mg total) by mouth 2 (two) times daily as needed for cough., Disp: 20 capsule, Rfl: 0   famotidine (PEPCID) 20 MG tablet, TAKE 1 TABLET BY MOUTH TWICE A DAY, Disp: 180 tablet, Rfl: 1   fluticasone (FLONASE) 50 MCG/ACT nasal spray, Place 2 sprays into both nostrils daily., Disp: 48 mL, Rfl: 1   Fluticasone-Umeclidin-Vilant (TRELEGY ELLIPTA) 100-62.5-25 MCG/ACT AEPB, Inhale 1 puff into the lungs daily., Disp: 1 each, Rfl: 5   montelukast (SINGULAIR) 10 MG tablet, TAKE 1 TABLET BY MOUTH EVERY DAY, Disp: 90 tablet, Rfl: 0   Multiple Vitamins-Minerals (MULTIVITAMIN ADULTS 50+ PO), Take 1 tablet by mouth daily. NATURE'S CODE MEN OVER 50 MULTIVITAMIN PACK, Disp: , Rfl:    Polyethyl Glycol-Propyl Glycol 0.4-0.3 % SOLN, Place 1-2 drops into both eyes 3 (three) times daily as needed (for  dry/irritated eyes.)., Disp: , Rfl:    rosuvastatin (CRESTOR) 40 MG tablet, Take 1 tablet (40 mg total) by mouth daily. In place of Atorvastatin for cholesterol, Disp: 90 tablet, Rfl: 3   tadalafil (CIALIS) 5 MG tablet, Take 1 tablet (5 mg total) by mouth daily., Disp: 90 tablet, Rfl: 1   tadalafil (CIALIS) 5 MG tablet, Take 1 tablet (5 mg total) by mouth every three (3) days as needed for erectile dysfunction., Disp: 30 tablet, Rfl: 1   tamsulosin (FLOMAX) 0.4 MG CAPS  capsule, TAKE 1 CAPSULE BY MOUTH EVERY EVENING, Disp: 90 capsule, Rfl: 1   tiZANidine (ZANAFLEX) 2 MG tablet, Take 1 tablet (2 mg total) by mouth 2 (two) times daily as needed for muscle spasms., Disp: 180 tablet, Rfl: 1   VELTASSA 8.4 g packet, Take 1 packet by mouth daily., Disp: , Rfl:    vitamin B-12 (CYANOCOBALAMIN) 500 MCG tablet, Take 500 mcg by mouth daily., Disp: , Rfl:   Allergies  Allergen Reactions   Almond (Diagnostic) Other (See Comments)    Migraines   Lactose Intolerance (Gi) Other (See Comments)    MIGRAINES   Peanut-Containing Drug Products Other (See Comments)    Migraines   Shellfish Allergy Other (See Comments)    Congestion/breathing problems/migraines.    I personally reviewed active problem list, medication list, allergies, family history, social history, health maintenance with the patient/caregiver today.   ROS  Constitutional: Negative for fever , positive for weight change.  Respiratory: Negative for cough and shortness of breath.   Cardiovascular: Negative for chest pain or palpitations.  Gastrointestinal: Negative for abdominal pain, no bowel changes.  Musculoskeletal: Negative for gait problem or joint swelling.  Skin: Negative for rash.  Neurological: Negative for dizziness or headache.  No other specific complaints in a complete review of systems (except as listed in HPI above).   Objective  Vitals:   06/18/22 0830  BP: 128/68  Pulse: 89  Resp: 16  SpO2: 94%  Weight: 289 lb  (131.1 kg)  Height: 6\' 2"  (1.88 m)    Body mass index is 37.11 kg/m.  Physical Exam  Constitutional: Patient appears well-developed and well-nourished. Obese  No distress.  HEENT: head atraumatic, normocephalic, pupils equal and reactive to light, neck supple Cardiovascular: Normal rate, regular rhythm and normal heart sounds.  No murmur heard. No BLE edema. Pulmonary/Chest: Effort normal and breath sounds normal. No respiratory distress. Abdominal: Soft.  There is no tenderness. Psychiatric: Patient has a normal mood and affect. behavior is normal. Judgment and thought content normal.    PHQ2/9:    06/18/2022    8:30 AM 01/30/2022    3:02 PM 01/03/2022    8:26 AM 10/11/2021    1:03 PM 09/19/2021    8:47 AM  Depression screen PHQ 2/9  Decreased Interest 0 0 0 0 0  Down, Depressed, Hopeless 0 0 0 0 0  PHQ - 2 Score 0 0 0 0 0  Altered sleeping 0 0 0 0 0  Tired, decreased energy 0 0 0 0 0  Change in appetite 0 0 0 0 0  Feeling bad or failure about yourself  0 0 0 0 0  Trouble concentrating 0 0 0 0 0  Moving slowly or fidgety/restless 0 0 0 0 0  Suicidal thoughts 0 0 0 0 0  PHQ-9 Score 0 0 0 0 0  Difficult doing work/chores  Not difficult at all       phq 9 is negative   Fall Risk:    06/18/2022    8:30 AM 01/30/2022    3:02 PM 01/03/2022    8:26 AM 10/11/2021    1:03 PM 09/19/2021    8:47 AM  Fall Risk   Falls in the past year? 0 0 0 0 0  Number falls in past yr: 0 0  0 0  Injury with Fall? 0 0  0 0  Risk for fall due to : No Fall Risks  No Fall Risks No Fall Risks No Fall Risks  Follow up Falls prevention discussed  Falls prevention discussed;Education provided;Falls evaluation completed Falls prevention discussed Falls prevention discussed      Functional Status Survey: Is the patient deaf or have difficulty hearing?: Yes Does the patient have difficulty seeing, even when wearing glasses/contacts?: No Does the patient have difficulty concentrating, remembering,  or making decisions?: No Does the patient have difficulty walking or climbing stairs?: No Does the patient have difficulty dressing or bathing?: No Does the patient have difficulty doing errands alone such as visiting a doctor's office or shopping?: No    Assessment & Plan  1. 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 Atherosclerosis of abdominal aorta (HCC)  On statin therapy  3. Chronic kidney disease, stage 3a (HCC)  Continue visits with nephrologist   4. Morbid obesity (HCC)  Lost 10 lbs recently   5. Secondary hyperparathyroidism (HCC)  Monitored by nephrologist   6. 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 Seasonal allergic rhinitis due to pollen  - fluticasone (FLONASE) 50 MCG/ACT nasal spray; Place 2 sprays into both nostrils daily.  Dispense: 48 mL; Refill: 1  8. Intermittent low back pain  - tiZANidine (ZANAFLEX) 2 MG tablet; Take 1 tablet (2 mg total) by mouth 2 (two) times daily as needed for muscle spasms.  Dispense: 180 tablet; Refill: 1  9. Asthma, well controlled, moderate persistent  - Fluticasone-Umeclidin-Vilant (TRELEGY ELLIPTA) 100-62.5-25 MCG/ACT AEPB; Inhale 1 puff into the lungs daily.  Dispense: 1 each; Refill: 5 - montelukast (SINGULAIR) 10 MG tablet; Take 1 tablet (10 mg total) by mouth daily.  Dispense: 90 tablet; Refill: 0

## 2022-06-18 ENCOUNTER — Ambulatory Visit (INDEPENDENT_AMBULATORY_CARE_PROVIDER_SITE_OTHER): Payer: Medicare Other | Admitting: Family Medicine

## 2022-06-18 VITALS — BP 128/68 | HR 89 | Resp 16 | Ht 74.0 in | Wt 289.0 lb

## 2022-06-18 DIAGNOSIS — N2581 Secondary hyperparathyroidism of renal origin: Secondary | ICD-10-CM | POA: Diagnosis not present

## 2022-06-18 DIAGNOSIS — I700 Atherosclerosis of aorta: Secondary | ICD-10-CM

## 2022-06-18 DIAGNOSIS — M545 Low back pain, unspecified: Secondary | ICD-10-CM | POA: Diagnosis not present

## 2022-06-18 DIAGNOSIS — E896 Postprocedural adrenocortical (-medullary) hypofunction: Secondary | ICD-10-CM | POA: Diagnosis not present

## 2022-06-18 DIAGNOSIS — N1831 Chronic kidney disease, stage 3a: Secondary | ICD-10-CM | POA: Diagnosis not present

## 2022-06-18 DIAGNOSIS — J4540 Moderate persistent asthma, uncomplicated: Secondary | ICD-10-CM | POA: Diagnosis not present

## 2022-06-18 DIAGNOSIS — I82532 Chronic embolism and thrombosis of left popliteal vein: Secondary | ICD-10-CM | POA: Diagnosis not present

## 2022-06-18 DIAGNOSIS — J301 Allergic rhinitis due to pollen: Secondary | ICD-10-CM | POA: Diagnosis not present

## 2022-06-18 MED ORDER — APIXABAN 5 MG PO TABS: 5.0000 mg | ORAL_TABLET | Freq: Two times a day (BID) | ORAL | 5 refills | Status: DC

## 2022-06-18 MED ORDER — FLUTICASONE PROPIONATE 50 MCG/ACT NA SUSP: 2.0000 | Freq: Every day | NASAL | 1 refills | Status: DC

## 2022-06-18 MED ORDER — TRELEGY ELLIPTA 100-62.5-25 MCG/ACT IN AEPB
1.0000 | INHALATION_SPRAY | Freq: Every day | RESPIRATORY_TRACT | 5 refills | Status: DC
Start: 2022-06-18 — End: 2022-12-29

## 2022-06-18 MED ORDER — TIZANIDINE HCL 2 MG PO TABS
2.0000 mg | ORAL_TABLET | Freq: Two times a day (BID) | ORAL | 1 refills | Status: DC | PRN
Start: 2022-06-18 — End: 2022-12-29

## 2022-06-18 MED ORDER — MONTELUKAST SODIUM 10 MG PO TABS: 10.0000 mg | ORAL_TABLET | Freq: Every day | ORAL | 0 refills | Status: DC

## 2022-07-13 ENCOUNTER — Other Ambulatory Visit: Payer: Self-pay | Admitting: Family Medicine

## 2022-07-13 DIAGNOSIS — N529 Male erectile dysfunction, unspecified: Secondary | ICD-10-CM

## 2022-07-14 ENCOUNTER — Other Ambulatory Visit: Payer: Self-pay

## 2022-07-14 DIAGNOSIS — N529 Male erectile dysfunction, unspecified: Secondary | ICD-10-CM

## 2022-07-14 MED ORDER — TADALAFIL 5 MG PO TABS: 5.0000 mg | ORAL_TABLET | Freq: Every day | ORAL | 1 refills | Status: DC

## 2022-07-22 ENCOUNTER — Telehealth: Payer: Self-pay | Admitting: Family Medicine

## 2022-07-22 ENCOUNTER — Other Ambulatory Visit: Payer: Self-pay

## 2022-07-22 ENCOUNTER — Other Ambulatory Visit: Payer: Self-pay | Admitting: Family Medicine

## 2022-07-22 DIAGNOSIS — J4540 Moderate persistent asthma, uncomplicated: Secondary | ICD-10-CM

## 2022-07-22 MED ORDER — ALBUTEROL SULFATE HFA 108 (90 BASE) MCG/ACT IN AERS
2.0000 | INHALATION_SPRAY | RESPIRATORY_TRACT | 0 refills | Status: DC | PRN
Start: 2022-07-22 — End: 2022-08-11

## 2022-07-22 NOTE — Telephone Encounter (Signed)
Pt's wife called saying her husband needs the albuterol inhaler .  He has not had one filled in about a year.  With the heat he is needing to use one.  [facility]   CB#  [phone]

## 2022-07-24 ENCOUNTER — Ambulatory Visit (INDEPENDENT_AMBULATORY_CARE_PROVIDER_SITE_OTHER): Payer: Medicare Other

## 2022-07-24 VITALS — Ht 74.0 in | Wt 289.0 lb

## 2022-07-24 DIAGNOSIS — Z0000 Encounter for general adult medical examination without abnormal findings: Secondary | ICD-10-CM | POA: Diagnosis not present

## 2022-07-24 NOTE — Progress Notes (Signed)
Subjective:   [name] is a 72 y.o. male who presents for Medicare Annual/Subsequent preventive examination.  Visit Complete: Virtual  I connected with  [name] on 07/24/22 by a audio enabled telemedicine application and verified that I am speaking with the correct person using two identifiers.  Patient Location: Home  Provider Location: Office/Clinic  I discussed the limitations of evaluation and management by telemedicine. The patient expressed understanding and agreed to proceed.  Patient Medicare AWV questionnaire was completed by the patient on (not done); I have confirmed that all information answered by patient is correct and no changes since this date.  Review of Systems    Cardiac Risk Factors include: advanced age (>56men, >22 women);dyslipidemia    Objective:    Today's Vitals   07/24/22 0839  Weight: 289 lb (131.1 kg)  Height: 6\' 2"  (1.88 m)   Body mass index is 37.11 kg/m.     12/14/2017    5:15 PM 12/02/2017   10:48 AM 08/25/2017    2:38 PM 02/26/2017   11:08 AM 12/19/2016   11:00 AM 12/19/2016    6:09 AM 12/15/2016    2:21 PM  Advanced Directives  Does Patient Have a Medical Advance Directive? No No No No No No No  Would patient like information on creating a medical advance directive? No - Patient declined Yes (MAU/Ambulatory/Procedural Areas - Information given) No - Patient declined  No - Patient declined No - Patient declined Yes (MAU/Ambulatory/Procedural Areas - Information given)    Current Medications (verified) Outpatient Encounter Medications as of 07/24/2022  Medication Sig   albuterol (VENTOLIN HFA) 108 (90 Base) MCG/ACT inhaler Inhale 2 puffs into the lungs every 4 (four) hours as needed for wheezing or shortness of breath.   apixaban (ELIQUIS) 5 MG TABS tablet Take 1 tablet (5 mg total) by mouth 2 (two) times daily.   azelastine (OPTIVAR) 0.05 % ophthalmic solution PLACE 2 DROPS INTO BOTH EYES 2 (TWO) TIMES DAILY.    famotidine (PEPCID) 20 MG tablet TAKE 1 TABLET BY MOUTH TWICE A DAY   fluticasone (FLONASE) 50 MCG/ACT nasal spray Place 2 sprays into both nostrils daily.   Fluticasone-Umeclidin-Vilant (TRELEGY ELLIPTA) 100-62.5-25 MCG/ACT AEPB Inhale 1 puff into the lungs daily.   montelukast (SINGULAIR) 10 MG tablet Take 1 tablet (10 mg total) by mouth daily.   Multiple Vitamins-Minerals (MULTIVITAMIN ADULTS 50+ PO) Take 1 tablet by mouth daily. NATURE'S CODE MEN OVER 50 MULTIVITAMIN PACK   Polyethyl Glycol-Propyl Glycol 0.4-0.3 % SOLN Place 1-2 drops into both eyes 3 (three) times daily as needed (for dry/irritated eyes.).   rosuvastatin (CRESTOR) 40 MG tablet Take 1 tablet (40 mg total) by mouth daily. In place of Atorvastatin for cholesterol   tadalafil (CIALIS) 5 MG tablet Take 1 tablet (5 mg total) by mouth every three (3) days as needed for erectile dysfunction.   tadalafil (CIALIS) 5 MG tablet Take 1 tablet (5 mg total) by mouth daily.   tamsulosin (FLOMAX) 0.4 MG CAPS capsule TAKE 1 CAPSULE BY MOUTH EVERY EVENING   tiZANidine (ZANAFLEX) 2 MG tablet Take 1 tablet (2 mg total) by mouth 2 (two) times daily as needed for muscle spasms.   VELTASSA 8.4 g packet Take 1 packet by mouth daily.   vitamin B-12 (CYANOCOBALAMIN) 500 MCG tablet Take 500 mcg by mouth daily.   No facility-administered encounter medications on file as of 07/24/2022.    Allergies (verified) Almond (diagnostic), Lactose intolerance (gi), Peanut-containing drug products, and Shellfish allergy  History: Past Medical History:  Diagnosis Date   Allergic rhinitis    Anemia    BPH (benign prostatic hyperplasia)    Chronic kidney disease    Chronic sinusitis    Complication of anesthesia    work up during surgery 2x in the past    Decreased libido    DVT (deep venous thrombosis) (HCC)    left leg after left knee surgery   ED (erectile dysfunction)    Fatigue    Hematuria    HTN (hypertension)    history of   Hypogonadism  in male    Hypokalemia    history of    IBS (irritable bowel syndrome)    Low serum vitamin D    Lumbago    Migraine    Mild intermittent asthma    Osteoarthritis of hip    left (bone-on-bone) Dr. Harvie Junior & Currie Paris. [place], PA-C   Pneumonia    walking pneumonia   Reflux    Shingles    Unilateral inguinal hernia without obstruction or gangrene    Past Surgical History:  Procedure Laterality Date   ADRENALECTOMY Left 02/11/2016   [facility]   COLONOSCOPY  02/2012   normal   JOINT REPLACEMENT     KNEE ARTHROSCOPY Left 10/06/2009   SINUS EXPLORATION     TOTAL HIP ARTHROPLASTY Left 12/14/2017   Procedure: TOTAL HIP ARTHROPLASTY ANTERIOR APPROACH;  Surgeon: Jodi Geralds, MD;  Location: [facility] OR;  Service: Orthopedics;  Laterality: Left;   TOTAL KNEE ARTHROPLASTY Right 06/20/2016   TOTAL KNEE ARTHROPLASTY Right 06/20/2016   Procedure: TOTAL KNEE ARTHROPLASTY;  Surgeon: Jodi Geralds, MD;  Location: [facility] OR;  Service: Orthopedics;  Laterality: Right;   TOTAL KNEE ARTHROPLASTY Left 12/19/2016   Procedure: LEFT TOTAL KNEE ARTHROPLASTY;  Surgeon: Jodi Geralds, MD;  Location: [facility];  Service: Orthopedics;  Laterality: Left;  Adductor Block   Family History  Problem Relation Age of Onset   Diabetes Mother    Heart disease Mother    Lung disease Mother    Seizures Maternal Grandmother    Alzheimer's disease Brother    Social History   Socioeconomic History   Marital status: Married    Spouse name: deborah   Number of children: 2   Years of education: Not on file   Highest education level: Master's degree (e.g., MA, MS, MEng, MEd, MSW, MBA)  Occupational History   Occupation: [employer]  Tobacco Use   Smoking status: Former    Current packs/day: 0.00    Average packs/day: 1 pack/day for 10.0 years (10.0 ttl pk-yrs)    Types: Cigarettes    Start date: 18    Quit date: 1980    Years since quitting: 44.5   Smokeless tobacco: Never   Tobacco comments:    39 years ago  38 when he stopped  Vaping Use   Vaping status: Never Used  Substance and Sexual Activity   Alcohol use: No    Alcohol/week: 0.0 standard drinks of alcohol   Drug use: No   Sexual activity: Yes    Partners: Female  Other Topics Concern   Not on file  Social History Narrative   Patient has 8 grands   Social Determinants of Health   Financial Resource Strain: Low Risk  (07/24/2022)   Overall Financial Resource Strain (CARDIA)    Difficulty of Paying Living Expenses: Not hard at all  Food Insecurity: No Food Insecurity (07/24/2022)   Hunger Vital Sign  Worried About Programme researcher, broadcasting/film/video in the Last Year: Never true    Ran Out of Food in the Last Year: Never true  Transportation Needs: No Transportation Needs (07/24/2022)   PRAPARE - Administrator, Civil Service (Medical): No    Lack of Transportation (Non-Medical): No  Physical Activity: Inactive (07/24/2022)   Exercise Vital Sign    Days of Exercise per Week: 0 days    Minutes of Exercise per Session: 30 min  Stress: No Stress Concern Present (07/24/2022)   [employer] of Occupational Health - Occupational Stress Questionnaire    Feeling of Stress : Not at all  Social Connections: Socially Integrated (07/24/2022)   Social Connection and Isolation Panel [NHANES]    Frequency of Communication with Friends and Family: More than three times a week    Frequency of Social Gatherings with Friends and Family: Twice a week    Attends Religious Services: More than 4 times per year    Active Member of [employer] or Organizations: Yes    Attends Banker Meetings: 1 to 4 times per year    Marital Status: Married    Tobacco Counseling Counseling given: Not Answered Tobacco comments: 38 years ago 38 when he stopped   Clinical Intake:  Pre-visit preparation completed: Yes  Pain : No/denies pain     BMI - recorded: 37.11 Nutritional Status: BMI > 30  Obese Nutritional Risks: None Diabetes: No  How often  do you need to have someone help you when you read instructions, pamphlets, or other written materials from your doctor or pharmacy?: 1 - Never  Interpreter Needed?: No  Comments: lives with wife Information entered by :: B.Zali Kamaka,LPN   Activities of Daily Living    07/24/2022    8:50 AM 06/18/2022    8:30 AM  In your present state of health, do you have any difficulty performing the following activities:  Hearing?  1  Vision? 0 0  Difficulty concentrating or making decisions? 1 0  Comment brain fog   Walking or climbing stairs? 0 0  Dressing or bathing? 0 0  Doing errands, shopping? 0 0  Preparing Food and eating ? N   Using the Toilet? N   In the past six months, have you accidently leaked urine? N   Do you have problems with loss of bowel control? N   Managing your Medications? N   Managing your Finances? N   Housekeeping or managing your Housekeeping? N     Patient Care Team: Alba Cory, MD as PCP - General (Family Medicine) Camillo Flaming, MD as Referring Physician (Internal Medicine) Jodi Geralds, MD as Consulting Physician (Orthopedic Surgery) Creig Hines, MD as Consulting Physician (Oncology) Belva Agee, MD as Consulting Physician (Urology)  Indicate any recent Medical Services you may have received from other than [facility] providers in the past year (date may be approximate).     Assessment:   This is a routine wellness examination for Kaito.  Hearing/Vision screen Hearing Screening - Comments:: Adequate hearing Vision Screening - Comments:: Adequate vision w/glasses [facility]  Dietary issues and exercise activities discussed:     Goals Addressed   None    Depression Screen    07/24/2022    8:48 AM 06/18/2022    8:30 AM 01/30/2022    3:02 PM 01/03/2022    8:26 AM 10/11/2021    1:03 PM 09/19/2021    8:47 AM 05/14/2021    2:35 PM  PHQ 2/9 Scores  PHQ - 2 Score 0 0 0 0 0 0 0  PHQ- 9 Score  0 0 0 0 0 0    Fall Risk    07/24/2022     8:44 AM 06/18/2022    8:30 AM 01/30/2022    3:02 PM 01/03/2022    8:26 AM 10/11/2021    1:03 PM  Fall Risk   Falls in the past year? 0 0 0 0 0  Number falls in past yr: 0 0 0  0  Injury with Fall? 0 0 0  0  Risk for fall due to : No Fall Risks No Fall Risks  No Fall Risks No Fall Risks  Follow up Education provided;Falls prevention discussed Falls prevention discussed  Falls prevention discussed;Education provided;Falls evaluation completed Falls prevention discussed    MEDICARE RISK AT HOME:  Medicare Risk at Home - 07/24/22 0845     Any stairs in or around the home? No    If so, are there any without handrails? No    Home free of loose throw rugs in walkways, pet beds, electrical cords, etc? Yes    Adequate lighting in your home to reduce risk of falls? Yes    Life alert? No    Use of a cane, walker or w/c? No    Grab bars in the bathroom? Yes    Shower chair or bench in shower? Yes    Elevated toilet seat or a handicapped toilet? Yes             TIMED UP AND GO:  Was the test performed?  No    Cognitive Function:        07/24/2022    8:53 AM  6CIT Screen  What Year? 0 points  What month? 0 points  What time? 0 points  Count back from 20 0 points  Months in reverse 0 points  Repeat phrase 0 points  Total Score 0 points    Immunizations Immunization History  Administered Date(s) Administered   Fluad Quad(high Dose 65+) 10/05/2019, 09/19/2021   Influenza, High Dose Seasonal PF 09/15/2017, 12/18/2018   Influenza, Seasonal, Injecte, Preservative Fre 12/08/2011   Influenza,inj,Quad PF,6+ Mos 10/21/2012, 10/31/2014, 10/10/2015, 10/13/2016   Influenza-Unspecified 10/21/2012, 11/30/2020   Moderna Sars-Covid-2 Vaccination 02/04/2019, 03/04/2019, 11/11/2019, 08/18/2020, 11/27/2020   Pneumococcal Conjugate-13 02/29/2016   Pneumococcal Polysaccharide-23 04/01/2012, 07/02/2017   Tdap 12/08/2011, 01/03/2022   Zoster Recombinant(Shingrix) 11/30/2020, 06/03/2021    Zoster, Live 12/08/2011    TDAP status: Up to date  Flu Vaccine status: Up to date  Pneumococcal vaccine status: Up to date  Covid-19 vaccine status: Completed vaccines  Qualifies for Shingles Vaccine? Yes   Zostavax completed Yes   Shingrix Completed?: Yes  Screening Tests Health Maintenance  Topic Date Due   COVID-19 Vaccine (6 - 2023-24 season) 09/06/2021   INFLUENZA VACCINE  08/07/2022   Medicare Annual Wellness (AWV)  07/24/2023   Colonoscopy  12/06/2024   DTaP/Tdap/Td (3 - Td or Tdap) 01/04/2032   Pneumonia Vaccine 48+ Years old  Completed   Hepatitis C Screening  Completed   Zoster Vaccines- Shingrix  Completed   HPV VACCINES  Aged Out    Health Maintenance  Health Maintenance Due  Topic Date Due   COVID-19 Vaccine (6 - 2023-24 season) 09/06/2021    Colorectal cancer screening: Type of screening: Colonoscopy. Completed yes. Repeat every 5 years  Lung Cancer Screening: (Low Dose CT Chest recommended if Age 53-80 years, 20 pack-year currently smoking OR have quit w/in  15years.) does not qualify.   Lung Cancer Screening Referral: no   Additional Screening:  Hepatitis C Screening: does not qualify; Completed yes   Vision Screening: Recommended annual ophthalmology exams for early detection of glaucoma and other disorders of the eye. Is the patient up to date with their annual eye exam?  Yes  Who is the provider or what is the name of the office in which the patient attends annual eye exams? [facility] If pt is not established with a provider, would they like to be referred to a provider to establish care? No .   Dental Screening: Recommended annual dental exams for proper oral hygiene  Diabetic Foot Exam: n/a  Community Resource Referral / Chronic Care Management: CRR required this visit?  No   CCM required this visit?  No     Plan:     I have personally reviewed and noted the following in the patient's chart:   Medical and social history Use  of alcohol, tobacco or illicit drugs  Current medications and supplements including opioid prescriptions. Patient is not currently taking opioid prescriptions. Functional ability and status Nutritional status Physical activity Advanced directives List of other physicians Hospitalizations, surgeries, and ER visits in previous 12 months Vitals Screenings to include cognitive, depression, and falls Referrals and appointments  In addition, I have reviewed and discussed with patient certain preventive protocols, quality metrics, and best practice recommendations. A written personalized care plan for preventive services as well as general preventive health recommendations were provided to patient.    Sue Lush, LPN   1/61/0960   After Visit Summary: (MyChart) Due to this being a telephonic visit, the after visit summary with patients personalized plan was offered to patient via MyChart   Nurse Notes: The patient states he is doing well and has no concerns or questions at this time.

## 2022-07-24 NOTE — Patient Instructions (Signed)
Mr. [name] , Thank you for taking time to come for your Medicare Wellness Visit. I appreciate your ongoing commitment to your health goals. Please review the following plan we discussed and let me know if I can assist you in the future.   These are the goals we discussed:  Goals   None     This is a list of the screening recommended for you and due dates:  Health Maintenance  Topic Date Due   COVID-19 Vaccine (6 - 2023-24 season) 09/06/2021   Flu Shot  08/07/2022   Medicare Annual Wellness Visit  07/24/2023   Colon Cancer Screening  12/06/2024   DTaP/Tdap/Td vaccine (3 - Td or Tdap) 01/04/2032   Pneumonia Vaccine  Completed   Hepatitis C Screening  Completed   Zoster (Shingles) Vaccine  Completed   HPV Vaccine  Aged Out    Advanced directives: yes  Conditions/risks identified: none  Next appointment: Follow up in one year for your annual wellness visit. 07/30/2023 @ 8:15am telephone  Preventive Care 71 Years and Older, Male  Preventive care refers to lifestyle choices and visits with your health care provider that can promote health and wellness. What does preventive care include? A yearly physical exam. This is also called an annual well check. Dental exams once or twice a year. Routine eye exams. Ask your health care provider how often you should have your eyes checked. Personal lifestyle choices, including: Daily care of your teeth and gums. Regular physical activity. Eating a healthy diet. Avoiding tobacco and drug use. Limiting alcohol use. Practicing safe sex. Taking low doses of aspirin every day. Taking vitamin and mineral supplements as recommended by your health care provider. What happens during an annual well check? The services and screenings done by your health care provider during your annual well check will depend on your age, overall health, lifestyle risk factors, and family history of disease. Counseling  Your health care provider may ask you  questions about your: Alcohol use. Tobacco use. Drug use. Emotional well-being. Home and relationship well-being. Sexual activity. Eating habits. History of falls. Memory and ability to understand (cognition). Work and work Astronomer. Screening  You may have the following tests or measurements: Height, weight, and BMI. Blood pressure. Lipid and cholesterol levels. These may be checked every 5 years, or more frequently if you are over 41 years old. Skin check. Lung cancer screening. You may have this screening every year starting at age 71 if you have a 30-pack-year history of smoking and currently smoke or have quit within the past 15 years. Fecal occult blood test (FOBT) of the stool. You may have this test every year starting at age 71. Flexible sigmoidoscopy or colonoscopy. You may have a sigmoidoscopy every 5 years or a colonoscopy every 10 years starting at age 30. Prostate cancer screening. Recommendations will vary depending on your family history and other risks. Hepatitis C blood test. Hepatitis B blood test. Sexually transmitted disease (STD) testing. Diabetes screening. This is done by checking your blood sugar (glucose) after you have not eaten for a while (fasting). You may have this done every 1-3 years. Abdominal aortic aneurysm (AAA) screening. You may need this if you are a current or former smoker. Osteoporosis. You may be screened starting at age 60 if you are at high risk. Talk with your health care provider about your test results, treatment options, and if necessary, the need for more tests. Vaccines  Your health care provider may recommend certain vaccines, such  as: Influenza vaccine. This is recommended every year. Tetanus, diphtheria, and acellular pertussis (Tdap, Td) vaccine. You may need a Td booster every 10 years. Zoster vaccine. You may need this after age 12. Pneumococcal 13-valent conjugate (PCV13) vaccine. One dose is recommended after age  72. Pneumococcal polysaccharide (PPSV23) vaccine. One dose is recommended after age 4. Talk to your health care provider about which screenings and vaccines you need and how often you need them. This information is not intended to replace advice given to you by your health care provider. Make sure you discuss any questions you have with your health care provider. Document Released: 01/19/2015 Document Revised: 09/12/2015 Document Reviewed: 10/24/2014 Elsevier Interactive Patient Education  2017 [employer].  Fall Prevention in the Home Falls can cause injuries. They can happen to people of all ages. There are many things you can do to make your home safe and to help prevent falls. What can I do on the outside of my home? Regularly fix the edges of walkways and driveways and fix any cracks. Remove anything that might make you trip as you walk through a door, such as a raised step or threshold. Trim any bushes or trees on the path to your home. Use bright outdoor lighting. Clear any walking paths of anything that might make someone trip, such as rocks or tools. Regularly check to see if handrails are loose or broken. Make sure that both sides of any steps have handrails. Any raised decks and porches should have guardrails on the edges. Have any leaves, snow, or ice cleared regularly. Use sand or salt on walking paths during winter. Clean up any spills in your garage right away. This includes oil or grease spills. What can I do in the bathroom? Use night lights. Install grab bars by the toilet and in the tub and shower. Do not use towel bars as grab bars. Use non-skid mats or decals in the tub or shower. If you need to sit down in the shower, use a plastic, non-slip stool. Keep the floor dry. Clean up any water that spills on the floor as soon as it happens. Remove soap buildup in the tub or shower regularly. Attach bath mats securely with double-sided non-slip rug tape. Do not have throw  rugs and other things on the floor that can make you trip. What can I do in the bedroom? Use night lights. Make sure that you have a light by your bed that is easy to reach. Do not use any sheets or blankets that are too big for your bed. They should not hang down onto the floor. Have a firm chair that has side arms. You can use this for support while you get dressed. Do not have throw rugs and other things on the floor that can make you trip. What can I do in the kitchen? Clean up any spills right away. Avoid walking on wet floors. Keep items that you use a lot in easy-to-reach places. If you need to reach something above you, use a strong step stool that has a grab bar. Keep electrical cords out of the way. Do not use floor polish or wax that makes floors slippery. If you must use wax, use non-skid floor wax. Do not have throw rugs and other things on the floor that can make you trip. What can I do with my stairs? Do not leave any items on the stairs. Make sure that there are handrails on both sides of the stairs and use  them. Fix handrails that are broken or loose. Make sure that handrails are as long as the stairways. Check any carpeting to make sure that it is firmly attached to the stairs. Fix any carpet that is loose or worn. Avoid having throw rugs at the top or bottom of the stairs. If you do have throw rugs, attach them to the floor with carpet tape. Make sure that you have a light switch at the top of the stairs and the bottom of the stairs. If you do not have them, ask someone to add them for you. What else can I do to help prevent falls? Wear shoes that: Do not have high heels. Have rubber bottoms. Are comfortable and fit you well. Are closed at the toe. Do not wear sandals. If you use a stepladder: Make sure that it is fully opened. Do not climb a closed stepladder. Make sure that both sides of the stepladder are locked into place. Ask someone to hold it for you, if  possible. Clearly mark and make sure that you can see: Any grab bars or handrails. First and last steps. Where the edge of each step is. Use tools that help you move around (mobility aids) if they are needed. These include: Canes. Walkers. Scooters. Crutches. Turn on the lights when you go into a dark area. Replace any light bulbs as soon as they burn out. Set up your furniture so you have a clear path. Avoid moving your furniture around. If any of your floors are uneven, fix them. If there are any pets around you, be aware of where they are. Review your medicines with your doctor. Some medicines can make you feel dizzy. This can increase your chance of falling. Ask your doctor what other things that you can do to help prevent falls. This information is not intended to replace advice given to you by your health care provider. Make sure you discuss any questions you have with your health care provider. Document Released: 10/19/2008 Document Revised: 05/31/2015 Document Reviewed: 01/27/2014 Elsevier Interactive Patient Education  2017 [employer].

## 2022-08-10 ENCOUNTER — Other Ambulatory Visit: Payer: Self-pay | Admitting: Family Medicine

## 2022-08-10 DIAGNOSIS — J4540 Moderate persistent asthma, uncomplicated: Secondary | ICD-10-CM

## 2022-08-11 ENCOUNTER — Other Ambulatory Visit: Payer: Self-pay

## 2022-08-11 DIAGNOSIS — J4540 Moderate persistent asthma, uncomplicated: Secondary | ICD-10-CM

## 2022-08-11 MED ORDER — ALBUTEROL SULFATE HFA 108 (90 BASE) MCG/ACT IN AERS
2.0000 | INHALATION_SPRAY | RESPIRATORY_TRACT | 0 refills | Status: DC | PRN
Start: 2022-08-11 — End: 2022-12-24

## 2022-10-06 ENCOUNTER — Other Ambulatory Visit: Payer: Self-pay | Admitting: Family Medicine

## 2022-10-06 DIAGNOSIS — K219 Gastro-esophageal reflux disease without esophagitis: Secondary | ICD-10-CM

## 2022-10-22 NOTE — Progress Notes (Unsigned)
Name: [name]   MRN: [id number]    DOB: Jun 26, 1950   Date:10/23/2022       Progress Note  Subjective  Chief Complaint  Annual Exam  HPI  Patient presents for annual CPE.  IPSS Questionnaire (AUA-7): Over the past month.   1)  How often have you had a sensation of not emptying your bladder completely after you finish urinating?  0 - Not at all  2)  How often have you had to urinate again less than two hours after you finished urinating? 2 - Less than half the time  3)  How often have you found you stopped and started again several times when you urinated?  3 - About half the time  4) How difficult have you found it to postpone urination?  3 - About half the time  5) How often have you had a weak urinary stream?  1 - Less than 1 time in 5  6) How often have you had to push or strain to begin urination?  0 - Not at all  7) How many times did you most typically get up to urinate from the time you went to bed until the time you got up in the morning?  3 - 3 times  Total score:  0-7 mildly symptomatic   8-19 moderately symptomatic   20-35 severely symptomatic     Diet: he drinks a protein tea at lunch time, and dinner at home  Exercise: discussed regular physical activity  Last Dental Exam: up to date  Last Eye Exam: up to date   Depression: phq 9 is negative    10/23/2022    9:05 AM 07/24/2022    8:48 AM 06/18/2022    8:30 AM 01/30/2022    3:02 PM 01/03/2022    8:26 AM  Depression screen PHQ 2/9  Decreased Interest 0 0 0 0 0  Down, Depressed, Hopeless 0 0 0 0 0  PHQ - 2 Score 0 0 0 0 0  Altered sleeping 0  0 0 0  Tired, decreased energy 0  0 0 0  Change in appetite 0  0 0 0  Feeling bad or failure about yourself  0  0 0 0  Trouble concentrating 0  0 0 0  Moving slowly or fidgety/restless 0  0 0 0  Suicidal thoughts 0  0 0 0  PHQ-9 Score 0  0 0 0  Difficult doing work/chores    Not difficult at all     Hypertension:  BP Readings from Last 3 Encounters:   10/23/22 124/68  06/18/22 128/68  01/30/22 122/78    Obesity: Wt Readings from Last 3 Encounters:  10/23/22 288 lb 14.4 oz (131 kg)  07/24/22 289 lb (131.1 kg)  06/18/22 289 lb (131.1 kg)   BMI Readings from Last 3 Encounters:  10/23/22 37.09 kg/m  07/24/22 37.11 kg/m  06/18/22 37.11 kg/m     Lipids:  Lab Results  Component Value Date   CHOL 147 09/19/2021   CHOL 164 08/06/2020   CHOL 143 06/16/2019   Lab Results  Component Value Date   HDL 46 09/19/2021   HDL 42 08/06/2020   HDL 44 06/16/2019   Lab Results  Component Value Date   LDLCALC 85 09/19/2021   LDLCALC 100 (H) 08/06/2020   LDLCALC 79 06/16/2019   Lab Results  Component Value Date   TRIG 71 09/19/2021   TRIG 128 08/06/2020   TRIG 118 06/16/2019   Lab  Results  Component Value Date   CHOLHDL 3.2 09/19/2021   CHOLHDL 3.9 08/06/2020   CHOLHDL 3.3 06/16/2019   No results found for: "LDLDIRECT" Glucose:  Glucose, Bld  Date Value Ref Range Status  09/19/2021 108 (H) 65 - 99 mg/dL Final    Comment:    .            Fasting reference interval . For someone without known diabetes, a glucose value between 100 and 125 mg/dL is consistent with prediabetes and should be confirmed with a follow-up test. .   03/11/2021 96 65 - 99 mg/dL Final    Comment:    .            Fasting reference interval .   08/06/2020 95 65 - 99 mg/dL Final    Comment:    .            Fasting reference interval .    Glucose-Capillary  Date Value Ref Range Status  12/16/2017 81 70 - 99 mg/dL Final  25/36/6440 347 (H) 70 - 99 mg/dL Final  42/59/5638 756 (H) 70 - 99 mg/dL Final    Flowsheet Row Clinical Support from 07/24/2022 in [facility]  AUDIT-C Score 0       Married STD testing and prevention (HIV/chl/gon/syphilis): N/A Sexual history: he has noticed difficulty maintaining an erection, normal libido but afraid to try having sex  Hep C Screening: 06/20/15 Skin cancer:  Discussed monitoring for atypical lesions Colorectal cancer: 12/06/21, repeat in 2026  Prostate cancer:   Lab Results  Component Value Date   PSA 1.59 09/19/2021   PSA 2.26 08/06/2020   PSA 2.71 12/16/2019     Lung cancer:  Low Dose CT Chest recommended if Age 63-80 years, 30 pack-year currently smoking OR have quit w/in 15years. He is not a candidate  AAA: CT negative in 2017 ECG:  09/28/20  Vaccines:   RSV : he will get it at local pharmacy  Tdap: up to date Shingrix: up to date Pneumonia: up to date Flu: today  COVID-19: discussed booster   Advanced Care Planning: A voluntary discussion about advance care planning including the explanation and discussion of advance directives.  Discussed health care proxy and Living will, and the patient was able to identify a health care proxy as wife .  Patient does not have a living will and power of attorney of health care   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/2020   Osteoarthritis, multiple sites 06/16/2019   Morbid obesity (HCC) 06/16/2019   Atherosclerosis of abdominal aorta (HCC) 09/15/2017   Chronic deep vein thrombosis (DVT) of left popliteal vein (HCC) 06/26/2017   History of benign neoplasm of adrenal gland 02/11/2016   History of iron deficiency anemia 10/10/2015   BPH (benign prostatic hyperplasia) 06/20/2015   ED (erectile dysfunction) 06/20/2015   Allergic rhinitis, seasonal 06/20/2015   Anemia of chronic disease 06/20/2015   Hypogonadism in male 06/20/2015   Asthma, well controlled, moderate persistent 06/20/2015   Hyperglycemia 06/20/2015   History of shingles 06/20/2015   GERD without esophagitis 06/20/2015   Chronic radicular low back pain 06/20/2015   History of epilepsy 06/20/2015   Migraine without aura and without status migrainosus, not intractable 06/20/2015   Dyslipidemia 06/20/2015   Primary osteoarthritis of both knees 06/20/2015    Past Surgical  History:  Procedure Laterality Date   ADRENALECTOMY Left 02/11/2016   [facility]  COLONOSCOPY  02/2012   normal   JOINT REPLACEMENT     KNEE ARTHROSCOPY Left 10/06/2009   SINUS EXPLORATION     TOTAL HIP ARTHROPLASTY Left 12/14/2017   Procedure: TOTAL HIP ARTHROPLASTY ANTERIOR APPROACH;  Surgeon: Jodi Geralds, MD;  Location: [facility] OR;  Service: Orthopedics;  Laterality: Left;   TOTAL KNEE ARTHROPLASTY Right 06/20/2016   TOTAL KNEE ARTHROPLASTY Right 06/20/2016   Procedure: TOTAL KNEE ARTHROPLASTY;  Surgeon: Jodi Geralds, MD;  Location: [facility] OR;  Service: Orthopedics;  Laterality: Right;   TOTAL KNEE ARTHROPLASTY Left 12/19/2016   Procedure: LEFT TOTAL KNEE ARTHROPLASTY;  Surgeon: Jodi Geralds, MD;  Location: [facility] ORS;  Service: Orthopedics;  Laterality: Left;  Adductor Block    Family History  Problem Relation Age of Onset   Diabetes Mother    Heart disease Mother    Lung disease Mother    Seizures Maternal Grandmother    Alzheimer's disease Brother     Social History   Socioeconomic History   Marital status: Married    Spouse name: deborah   Number of children: 2   Years of education: Not on file   Highest education level: Master's degree (e.g., MA, MS, MEng, MEd, MSW, MBA)  Occupational History   Occupation: [employer]  Tobacco Use   Smoking status: Former    Current packs/day: 0.00    Average packs/day: 1 pack/day for 10.0 years (10.0 ttl pk-yrs)    Types: Cigarettes    Start date: 36    Quit date: 1980    Years since quitting: 44.8   Smokeless tobacco: Never   Tobacco comments:    38 years ago 38 when he stopped  Vaping Use   Vaping status: Never Used  Substance and Sexual Activity   Alcohol use: No    Alcohol/week: 0.0 standard drinks of alcohol   Drug use: No   Sexual activity: Yes    Partners: Female  Other Topics Concern   Not on file  Social History Narrative   Patient has 8 grands   Social Determinants of Health   Financial Resource Strain: Low  Risk  (07/24/2022)   Overall Financial Resource Strain (CARDIA)    Difficulty of Paying Living Expenses: Not hard at all  Food Insecurity: No Food Insecurity (07/24/2022)   Hunger Vital Sign    Worried About Running Out of Food in the Last Year: Never true    Ran Out of Food in the Last Year: Never true  Transportation Needs: No Transportation Needs (07/24/2022)   PRAPARE - Administrator, Civil Service (Medical): No    Lack of Transportation (Non-Medical): No  Physical Activity: Inactive (07/24/2022)   Exercise Vital Sign    Days of Exercise per Week: 0 days    Minutes of Exercise per Session: 30 min  Stress: No Stress Concern Present (07/24/2022)   [employer] of Occupational Health - Occupational Stress Questionnaire    Feeling of Stress : Not at all  Social Connections: Socially Integrated (07/24/2022)   Social Connection and Isolation Panel [NHANES]    Frequency of Communication with Friends and Family: More than three times a week    Frequency of Social Gatherings with Friends and Family: Twice a week    Attends Religious Services: More than 4 times per year    Active Member of [employer] or Organizations: Yes    Attends Banker Meetings: 1 to 4 times per year    Marital Status: Married  Catering manager  Violence: Not At Risk (07/24/2022)   Humiliation, Afraid, Rape, and Kick questionnaire    Fear of Current or Ex-Partner: No    Emotionally Abused: No    Physically Abused: No    Sexually Abused: No     Current Outpatient Medications:    albuterol (VENTOLIN HFA) 108 (90 Base) MCG/ACT inhaler, Inhale 2 puffs into the lungs every 4 (four) hours as needed for wheezing or shortness of breath., Disp: 8.5 each, Rfl: 0   apixaban (ELIQUIS) 5 MG TABS tablet, Take 1 tablet (5 mg total) by mouth 2 (two) times daily., Disp: 60 tablet, Rfl: 5   azelastine (OPTIVAR) 0.05 % ophthalmic solution, PLACE 2 DROPS INTO BOTH EYES 2 (TWO) TIMES DAILY., Disp: 54 mL, Rfl: 1    famotidine (PEPCID) 20 MG tablet, TAKE 1 TABLET BY MOUTH TWICE A DAY, Disp: 60 tablet, Rfl: 0   fluticasone (FLONASE) 50 MCG/ACT nasal spray, Place 2 sprays into both nostrils daily., Disp: 48 mL, Rfl: 1   Fluticasone-Umeclidin-Vilant (TRELEGY ELLIPTA) 100-62.5-25 MCG/ACT AEPB, Inhale 1 puff into the lungs daily., Disp: 1 each, Rfl: 5   montelukast (SINGULAIR) 10 MG tablet, Take 1 tablet (10 mg total) by mouth daily., Disp: 90 tablet, Rfl: 0   Multiple Vitamins-Minerals (MULTIVITAMIN ADULTS 50+ PO), Take 1 tablet by mouth daily. NATURE'S CODE MEN OVER 50 MULTIVITAMIN PACK, Disp: , Rfl:    Polyethyl Glycol-Propyl Glycol 0.4-0.3 % SOLN, Place 1-2 drops into both eyes 3 (three) times daily as needed (for dry/irritated eyes.)., Disp: , Rfl:    rosuvastatin (CRESTOR) 40 MG tablet, Take 1 tablet (40 mg total) by mouth daily. In place of Atorvastatin for cholesterol, Disp: 90 tablet, Rfl: 3   tadalafil (CIALIS) 5 MG tablet, Take 1 tablet (5 mg total) by mouth every three (3) days as needed for erectile dysfunction., Disp: 30 tablet, Rfl: 1   tadalafil (CIALIS) 5 MG tablet, Take 1 tablet (5 mg total) by mouth daily., Disp: 90 tablet, Rfl: 1   tamsulosin (FLOMAX) 0.4 MG CAPS capsule, TAKE 1 CAPSULE BY MOUTH EVERY EVENING, Disp: 90 capsule, Rfl: 1   tiZANidine (ZANAFLEX) 2 MG tablet, Take 1 tablet (2 mg total) by mouth 2 (two) times daily as needed for muscle spasms., Disp: 180 tablet, Rfl: 1   VELTASSA 8.4 g packet, Take 1 packet by mouth daily., Disp: , Rfl:    vitamin B-12 (CYANOCOBALAMIN) 500 MCG tablet, Take 500 mcg by mouth daily., Disp: , Rfl:   Allergies  Allergen Reactions   Almond (Diagnostic) Other (See Comments)    Migraines   Lactose Intolerance (Gi) Other (See Comments)    MIGRAINES   Peanut-Containing Drug Products Other (See Comments)    Migraines   Shellfish Allergy Other (See Comments)    Congestion/breathing problems/migraines.     ROS  Constitutional: Negative for fever or  weight change.  Respiratory: Negative for cough and shortness of breath.   Cardiovascular: Negative for chest pain or palpitations.  Gastrointestinal: Negative for abdominal pain, no bowel changes.  Musculoskeletal: Negative for gait problem or joint swelling.  Skin: Negative for rash.  Neurological: Negative for dizziness or headache.  No other specific complaints in a complete review of systems (except as listed in HPI above).    Objective  Vitals:   10/23/22 0904  BP: 124/68  Pulse: 86  Resp: 18  Temp: 97.8 F (36.6 C)  TempSrc: Oral  SpO2: 98%  Weight: 288 lb 14.4 oz (131 kg)  Height: 6\' 2"  (1.88 m)  Body mass index is 37.09 kg/m.  Physical Exam  Constitutional: Patient appears well-developed and well-nourished. No distress.  HENT: Head: Normocephalic and atraumatic. Ears: B TMs ok, no erythema or effusion; Nose: Nose normal. Mouth/Throat: Oropharynx is clear and moist. No oropharyngeal exudate.  Eyes: Conjunctivae and EOM are normal. Pupils are equal, round, and reactive to light. No scleral icterus.  Neck: Normal range of motion. Neck supple. No JVD present. No thyromegaly present.  Cardiovascular: Normal rate, regular rhythm and normal heart sounds.  No murmur heard. No BLE edema. Pulmonary/Chest: Effort normal and breath sounds normal. No respiratory distress. Abdominal: Soft. Bowel sounds are normal, no distension. There is no tenderness. no masses MALE GENITALIA: Normal descended testes bilaterally, no masses palpated, no hernias, no lesions, no discharge RECTAL: Prostate normal size and consistency, no rectal masses or hemorrhoids  Musculoskeletal: Normal range of motion, no joint effusions. No gross deformities Neurological: he is alert and oriented to person, place, and time. No cranial nerve deficit. Coordination, balance, strength, speech and gait are normal.  Skin: Skin is warm and dry. No rash noted. No erythema.  Psychiatric: Patient has a normal mood and  affect. behavior is normal. Judgment and thought content normal.   Fall Risk:    10/23/2022    9:05 AM 07/24/2022    8:44 AM 06/18/2022    8:30 AM 01/30/2022    3:02 PM 01/03/2022    8:26 AM  Fall Risk   Falls in the past year? 0 0 0 0 0  Number falls in past yr:  0 0 0   Injury with Fall?  0 0 0   Risk for fall due to : No Fall Risks No Fall Risks No Fall Risks  No Fall Risks  Follow up Falls prevention discussed;Education provided;Falls evaluation completed Education provided;Falls prevention discussed Falls prevention discussed  Falls prevention discussed;Education provided;Falls evaluation completed     Functional Status Survey: Is the patient deaf or have difficulty hearing?: No Does the patient have difficulty seeing, even when wearing glasses/contacts?: No Does the patient have difficulty concentrating, remembering, or making decisions?: No Does the patient have difficulty walking or climbing stairs?: No Does the patient have difficulty dressing or bathing?: No Does the patient have difficulty doing errands alone such as visiting a doctor's office or shopping?: No    Assessment & Plan  1. Well adult exam   2. Need for immunization against influenza  - Flu Vaccine Trivalent High Dose (Fluad)    3. Erectile dysfunction, unspecified erectile dysfunction type  Continue cialis, consider penile pump versus injections, he will discuss it with his wife first  4. Benign prostatic hyperplasia with urinary obstruction  - PSA  5. Chronic kidney disease, stage 3a (HCC)  - COMPLETE METABOLIC PANEL WITH GFR - VITAMIN D 25 Hydroxy (Vit-D Deficiency, Fractures)  6. Atherosclerosis of abdominal aorta (HCC)  - Lipid panel  7. Dyslipidemia  - Lipid panel    -Prostate cancer screening and PSA options (with potential risks and benefits of testing vs not testing) were discussed along with recent recs/guidelines. -USPSTF grade A and B recommendations reviewed with patient;  age-appropriate recommendations, preventive care, screening tests, etc discussed and encouraged; healthy living encouraged; see AVS for patient education given to patient -Discussed importance of 150 minutes of physical activity weekly, eat two servings of fish weekly, eat one serving of tree nuts ( cashews, pistachios, pecans, almonds.Marland Kitchen) every other day, eat 6 servings of fruit/vegetables daily and drink plenty of water and avoid  sweet beverages.  -Reviewed Health Maintenance: yes

## 2022-10-23 ENCOUNTER — Ambulatory Visit (INDEPENDENT_AMBULATORY_CARE_PROVIDER_SITE_OTHER): Payer: Medicare Other | Admitting: Family Medicine

## 2022-10-23 ENCOUNTER — Encounter: Payer: Self-pay | Admitting: Family Medicine

## 2022-10-23 VITALS — BP 124/68 | HR 86 | Temp 97.8°F | Resp 18 | Ht 74.0 in | Wt 288.9 lb

## 2022-10-23 DIAGNOSIS — N1831 Chronic kidney disease, stage 3a: Secondary | ICD-10-CM

## 2022-10-23 DIAGNOSIS — E785 Hyperlipidemia, unspecified: Secondary | ICD-10-CM | POA: Diagnosis not present

## 2022-10-23 DIAGNOSIS — N401 Enlarged prostate with lower urinary tract symptoms: Secondary | ICD-10-CM

## 2022-10-23 DIAGNOSIS — Z0000 Encounter for general adult medical examination without abnormal findings: Secondary | ICD-10-CM | POA: Diagnosis not present

## 2022-10-23 DIAGNOSIS — Z0001 Encounter for general adult medical examination with abnormal findings: Secondary | ICD-10-CM | POA: Diagnosis not present

## 2022-10-23 DIAGNOSIS — I700 Atherosclerosis of aorta: Secondary | ICD-10-CM | POA: Diagnosis not present

## 2022-10-23 DIAGNOSIS — N138 Other obstructive and reflux uropathy: Secondary | ICD-10-CM | POA: Diagnosis not present

## 2022-10-23 DIAGNOSIS — Z23 Encounter for immunization: Secondary | ICD-10-CM

## 2022-10-23 DIAGNOSIS — N529 Male erectile dysfunction, unspecified: Secondary | ICD-10-CM | POA: Diagnosis not present

## 2022-10-23 NOTE — Patient Instructions (Signed)
Get RSV at local pharmacy

## 2022-10-24 ENCOUNTER — Other Ambulatory Visit: Payer: Self-pay | Admitting: Family Medicine

## 2022-10-24 DIAGNOSIS — N138 Other obstructive and reflux uropathy: Secondary | ICD-10-CM

## 2022-10-24 LAB — VITAMIN D 25 HYDROXY (VIT D DEFICIENCY, FRACTURES): Vit D, 25-Hydroxy: 50 ng/mL (ref 30–100)

## 2022-10-24 LAB — COMPLETE METABOLIC PANEL WITH GFR
AG Ratio: 1.4 (calc) (ref 1.0–2.5)
ALT: 25 U/L (ref 9–46)
AST: 28 U/L (ref 10–35)
Albumin: 4.4 g/dL (ref 3.6–5.1)
Alkaline phosphatase (APISO): 75 U/L (ref 35–144)
BUN/Creatinine Ratio: 15 (calc) (ref 6–22)
BUN: 21 mg/dL (ref 7–25)
CO2: 30 mmol/L (ref 20–32)
Calcium: 9.8 mg/dL (ref 8.6–10.3)
Chloride: 95 mmol/L — ABNORMAL LOW (ref 98–110)
Creat: 1.37 mg/dL — ABNORMAL HIGH (ref 0.70–1.28)
Globulin: 3.2 g/dL (ref 1.9–3.7)
Glucose, Bld: 99 mg/dL (ref 65–99)
Potassium: 4.9 mmol/L (ref 3.5–5.3)
Sodium: 135 mmol/L (ref 135–146)
Total Bilirubin: 0.5 mg/dL (ref 0.2–1.2)
Total Protein: 7.6 g/dL (ref 6.1–8.1)
eGFR: 55 mL/min/{1.73_m2} — ABNORMAL LOW (ref >=60)

## 2022-10-24 LAB — PSA: PSA: 1.97 ng/mL (ref <=4.00)

## 2022-10-24 LAB — LIPID PANEL
Cholesterol: 131 mg/dL (ref <200)
HDL: 43 mg/dL (ref >=40)
LDL Cholesterol (Calc): 71 mg/dL
Non-HDL Cholesterol (Calc): 88 mg/dL (ref <130)
Total CHOL/HDL Ratio: 3 (calc) (ref <5.0)
Triglycerides: 89 mg/dL (ref <150)

## 2022-10-26 ENCOUNTER — Other Ambulatory Visit: Payer: Self-pay | Admitting: Family Medicine

## 2022-10-26 DIAGNOSIS — E785 Hyperlipidemia, unspecified: Secondary | ICD-10-CM

## 2022-10-26 DIAGNOSIS — I700 Atherosclerosis of aorta: Secondary | ICD-10-CM

## 2022-11-06 ENCOUNTER — Other Ambulatory Visit: Payer: Self-pay | Admitting: Family Medicine

## 2022-11-06 DIAGNOSIS — K219 Gastro-esophageal reflux disease without esophagitis: Secondary | ICD-10-CM

## 2022-11-18 ENCOUNTER — Other Ambulatory Visit: Payer: Self-pay | Admitting: Family Medicine

## 2022-11-18 DIAGNOSIS — J301 Allergic rhinitis due to pollen: Secondary | ICD-10-CM

## 2022-11-27 DIAGNOSIS — E875 Hyperkalemia: Secondary | ICD-10-CM | POA: Diagnosis not present

## 2022-11-27 DIAGNOSIS — N1831 Chronic kidney disease, stage 3a: Secondary | ICD-10-CM | POA: Diagnosis not present

## 2022-11-27 DIAGNOSIS — D631 Anemia in chronic kidney disease: Secondary | ICD-10-CM | POA: Diagnosis not present

## 2022-11-27 DIAGNOSIS — N2581 Secondary hyperparathyroidism of renal origin: Secondary | ICD-10-CM | POA: Diagnosis not present

## 2022-12-09 ENCOUNTER — Other Ambulatory Visit: Payer: Self-pay | Admitting: Family Medicine

## 2022-12-09 DIAGNOSIS — J4540 Moderate persistent asthma, uncomplicated: Secondary | ICD-10-CM

## 2022-12-15 NOTE — Progress Notes (Signed)
Name: [name]   MRN: [id number]    DOB: June 26, 1950   Date:12/29/2022       Progress Note  Subjective  Chief Complaint  Chief Complaint  Patient presents with   Medical Management of Chronic Issues   Medication Refill    HPI  Morbid Obese: he was up to 303 lbs May 2020 and has been fluctuating since, but last visit with me 08/2020 it was 298 lbs it went down to  289 lbs in May 2023 , he recently started intermittent fasting and weight is down to 282 lbs . BMI over 35 with co-morbidities.    Atherosclerosis of aorta: on Eliquis and Crestor since Sep 2023 , last LDL was down to 71 lbs .   Status post adrenalectomy  and bp under control since.    Asthma Moderate: he is using Trelegy and singulair daily.  He states symptoms are controlled, no cough, wheezing or SOB. He uses albuterol very seldom   History of anemia: but seen by hematologist level back in Nov 2019 and Hgb  was  11.5 , he had recent labs done in Nov  2024 and is 11 . He has anemia of chronic disease, level is monitored to Dr. Cherylann Ratel    CKI stage IIIa : under the care of Dr. Cherylann Ratel. No pruritus. 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 Anemia of chronic disease is stable. He has a history of secondary hyperparathyroidism. GFR improved during last visit with Dr. Cherylann Ratel, baseline between 45-55    Dyslipidemia: he is not sure if he is taking Atorvastatin, last LDL  down to 71 and no side effects of medications    Chronic DVT: on Eliquis, no side effects of medication , no easy bruising. No leg pain but occasionally has some swelling on left lower leg . He bleeds easily , sometimes from nose but stops with pressure     GERD and eructation: he is doing well on Pepcid No heartburn or indigestion Stable Unchanged    BPH/ED:  He is taking medications, Cialis 5 mg was not working for ED, we added 10 mg but it made him flush so we will  change to 5 mg to take one daily and add extra 5 mg every 3 days prn intercourse and takes Flomax . He needs refill    AR: he has been taking singulair, Allegra 180 mg daily also uses flonase  , he is no longer taking Allegra D. 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 (HCC) 06/26/2017   History of benign neoplasm of adrenal gland 02/11/2016   History of iron deficiency anemia 10/10/2015   BPH (benign prostatic hyperplasia) 06/20/2015   ED (erectile dysfunction) 06/20/2015   Allergic rhinitis, seasonal 06/20/2015   Anemia of chronic disease 06/20/2015   Hypogonadism in male 06/20/2015   Asthma, well controlled, moderate persistent 06/20/2015   Hyperglycemia 06/20/2015   History of shingles 06/20/2015   GERD without esophagitis 06/20/2015   Chronic radicular low back pain 06/20/2015   History of epilepsy 06/20/2015   Migraine without aura and without status migrainosus, not intractable  06/20/2015   Dyslipidemia 06/20/2015   Primary osteoarthritis of both knees 06/20/2015    Past Surgical History:  Procedure Laterality Date   ADRENALECTOMY Left 02/11/2016   [facility]   COLONOSCOPY  02/2012   normal   JOINT REPLACEMENT     KNEE ARTHROSCOPY Left 10/06/2009   SINUS EXPLORATION     TOTAL HIP ARTHROPLASTY Left 12/14/2017   Procedure: TOTAL HIP ARTHROPLASTY ANTERIOR APPROACH;  Surgeon: Jodi Geralds, MD;  Location: [facility] OR;  Service: Orthopedics;  Laterality: Left;   TOTAL KNEE ARTHROPLASTY Right 06/20/2016   TOTAL KNEE ARTHROPLASTY Right 06/20/2016   Procedure: TOTAL KNEE ARTHROPLASTY;  Surgeon: Jodi Geralds, MD;  Location: [facility] OR;  Service: Orthopedics;  Laterality: Right;   TOTAL  KNEE ARTHROPLASTY Left 12/19/2016   Procedure: LEFT TOTAL KNEE ARTHROPLASTY;  Surgeon: Jodi Geralds, MD;  Location: [facility];  Service: Orthopedics;  Laterality: Left;  Adductor Block    Family History  Problem Relation Age of Onset   Diabetes Mother    Heart disease Mother    Lung disease Mother    Seizures Maternal Grandmother    Alzheimer's disease Brother     Social History   Tobacco Use   Smoking status: Former    Current packs/day: 0.00    Average packs/day: 1 pack/day for 10.0 years (10.0 ttl pk-yrs)    Types: Cigarettes    Start date: 45    Quit date: 1980    Years since quitting: 45.0   Smokeless tobacco: Never   Tobacco comments:    38 years ago 16 when he stopped  Substance Use Topics   Alcohol use: No    Alcohol/week: 0.0 standard drinks of alcohol     Current Outpatient Medications:    albuterol (VENTOLIN HFA) 108 (90 Base) MCG/ACT inhaler, INHALE 2 PUFFS INTO THE LUNGS EVERY 4 HOURS AS NEEDED FOR WHEEZING OR SHORTNESS OF BREATH., Disp: 8.5 each, Rfl: 0   azelastine (OPTIVAR) 0.05 % ophthalmic solution, PLACE 2 DROPS INTO BOTH EYES 2 (TWO) TIMES DAILY., Disp: 54 mL, Rfl: 1   famotidine (PEPCID) 20 MG tablet, TAKE 1 TABLET BY MOUTH TWICE A DAY, Disp: 180 tablet, Rfl: 0   fluticasone (FLONASE) 50 MCG/ACT nasal spray, SPRAY 2 SPRAYS INTO EACH NOSTRIL EVERY DAY, Disp: 48 mL, Rfl: 1   Multiple Vitamins-Minerals (MULTIVITAMIN ADULTS 50+ PO), Take 1 tablet by mouth daily. NATURE'S CODE MEN OVER 50 MULTIVITAMIN PACK, Disp: , Rfl:    Polyethyl Glycol-Propyl Glycol 0.4-0.3 % SOLN, Place 1-2 drops into both eyes 3 (three) times daily as needed (for dry/irritated eyes.)., Disp: , Rfl:    rosuvastatin (CRESTOR) 40 MG tablet, TAKE 1 TABLET (40 MG TOTAL) BY MOUTH DAILY. IN PLACE OF ATORVASTATIN FOR CHOLESTEROL, Disp: 90 tablet, Rfl: 3   tamsulosin (FLOMAX) 0.4 MG CAPS capsule, TAKE 1 CAPSULE BY MOUTH EVERY DAY IN THE EVENING, Disp: 90 capsule, Rfl: 1   VELTASSA 8.4 g packet,  Take 1 packet by mouth daily., Disp: , Rfl:    vitamin B-12 (CYANOCOBALAMIN) 500 MCG tablet, Take 500 mcg by mouth daily., Disp: , Rfl:    apixaban (ELIQUIS) 5 MG TABS tablet, Take 1 tablet (5 mg total) by mouth 2 (two) times daily., Disp: 60 tablet, Rfl: 5   Fluticasone-Umeclidin-Vilant (TRELEGY ELLIPTA) 100-62.5-25 MCG/ACT AEPB, Inhale 1 puff into the lungs daily., Disp: 1 each, Rfl: 5   montelukast (SINGULAIR) 10 MG tablet, Take 1 tablet (10 mg total) by mouth daily., Disp: 30 tablet, Rfl: 5   tadalafil (CIALIS)  5 MG tablet, Take 1 tablet (5 mg total) by mouth daily., Disp: 90 tablet, Rfl: 1   tadalafil (CIALIS) 5 MG tablet, Take 1 tablet (5 mg total) by mouth in the morning., Disp: 90 tablet, Rfl: 1   tiZANidine (ZANAFLEX) 2 MG tablet, Take 1 tablet (2 mg total) by mouth 2 (two) times daily as needed for muscle spasms., Disp: 60 tablet, Rfl: 5  Allergies  Allergen Reactions   Almond (Diagnostic) Other (See Comments)    Migraines   Lactose Intolerance (Gi) Other (See Comments)    MIGRAINES   Peanut-Containing Drug Products Other (See Comments)    Migraines   Shellfish Allergy Other (See Comments)    Congestion/breathing problems/migraines.    I personally reviewed active problem list, medication list, allergies, family history, social history with the patient/caregiver today.   ROS  Constitutional: Negative for fever or weight change.  Respiratory: Negative for cough and shortness of breath.   Cardiovascular: Negative for chest pain or palpitations.  Gastrointestinal: Negative for abdominal pain, no bowel changes.  Musculoskeletal: Negative for gait problem or joint swelling.  Skin: Negative for rash.  Neurological: Negative for dizziness or headache.  No other specific complaints in a complete review of systems (except as listed in HPI above).   Objective  Vitals:   12/29/22 0826  BP: 120/72  Pulse: 90  Resp: 18  Temp: 97.8 F (36.6 C)  SpO2: 100%  Weight: 282 lb  (127.9 kg)  Height: 6\' 2"  (1.88 m)    Body mass index is 36.21 kg/m.  Physical Exam  Constitutional: Patient appears well-developed and well-nourished. Obese  No distress.  HEENT: head atraumatic, normocephalic, pupils equal and reactive to light, neck supple Cardiovascular: Normal rate, regular rhythm and normal heart sounds.  No murmur heard. No BLE edema. Pulmonary/Chest: Effort normal and breath sounds normal. No respiratory distress. Abdominal: Soft.  There is no tenderness. Psychiatric: Patient has a normal mood and affect. behavior is normal. Judgment and thought content normal.   Recent Results (from the past 2160 hours)  COMPLETE METABOLIC PANEL WITH GFR     Status: Abnormal   Collection Time: 10/23/22 10:02 AM  Result Value Ref Range   Glucose, Bld 99 65 - 99 mg/dL    Comment: .            Fasting reference interval .    BUN 21 7 - 25 mg/dL   Creat 1.61 (H) 0.96 - 1.28 mg/dL   eGFR 55 (L) > OR = 60 mL/min/1.77m2   BUN/Creatinine Ratio 15 6 - 22 (calc)   Sodium 135 135 - 146 mmol/L   Potassium 4.9 3.5 - 5.3 mmol/L   Chloride 95 (L) 98 - 110 mmol/L   CO2 30 20 - 32 mmol/L   Calcium 9.8 8.6 - 10.3 mg/dL   Total Protein 7.6 6.1 - 8.1 g/dL   Albumin 4.4 3.6 - 5.1 g/dL   Globulin 3.2 1.9 - 3.7 g/dL (calc)   AG Ratio 1.4 1.0 - 2.5 (calc)   Total Bilirubin 0.5 0.2 - 1.2 mg/dL   Alkaline phosphatase (APISO) 75 35 - 144 U/L   AST 28 10 - 35 U/L   ALT 25 9 - 46 U/L  Lipid panel     Status: None   Collection Time: 10/23/22 10:02 AM  Result Value Ref Range   Cholesterol 131 <200 mg/dL   HDL 43 > OR = 40 mg/dL   Triglycerides 89 <045 mg/dL   LDL Cholesterol (Calc) 71  mg/dL (calc)    Comment: Reference range: <100 . Desirable range <100 mg/dL for primary prevention;   <70 mg/dL for patients with CHD or diabetic patients  with > or = 2 CHD risk factors. Marland Kitchen LDL-C is now calculated using the Martin-Hopkins  calculation, which is a validated novel method providing   better accuracy than the Friedewald equation in the  estimation of LDL-C.  Horald Pollen et al. Lenox Ahr. 1610;960(45): 2061-2068  (http://education.[facility].com/faq/FAQ164)    Total CHOL/HDL Ratio 3.0 <5.0 (calc)   Non-HDL Cholesterol (Calc) 88 <409 mg/dL (calc)    Comment: For patients with diabetes plus 1 major ASCVD risk  factor, treating to a non-HDL-C goal of <100 mg/dL  (LDL-C of <81 mg/dL) is considered a therapeutic  option.   PSA     Status: None   Collection Time: 10/23/22 10:02 AM  Result Value Ref Range   PSA 1.97 < OR = 4.00 ng/mL    Comment: The total PSA value from this assay system is  standardized against the WHO standard. The test  result will be approximately 20% lower when compared  to the equimolar-standardized total PSA (Beckman  Coulter). Comparison of serial PSA results should be  interpreted with this fact in mind. . This test was performed using the Siemens  chemiluminescent method. Values obtained from  different assay methods cannot be used interchangeably. PSA levels, regardless of value, should not be interpreted as absolute evidence of the presence or absence of disease.   VITAMIN D 25 Hydroxy (Vit-D Deficiency, Fractures)     Status: None   Collection Time: 10/23/22 10:02 AM  Result Value Ref Range   Vit D, 25-Hydroxy 50 30 - 100 ng/mL    Comment: Vitamin D Status         25-OH Vitamin D: . Deficiency:                    <20 ng/mL Insufficiency:             20 - 29 ng/mL Optimal:                 > or = 30 ng/mL . For 25-OH Vitamin D testing on patients on  D2-supplementation and patients for whom quantitation  of D2 and D3 fractions is required, the Quest[employer]ureD(TM) 25-OH VIT D, (D2,D3), LC/MS/MS is recommended: order  code 19147 (patients >49yrs). . See Note 1 . Note 1 . For additional information, please refer to  http://education.[facility].com/faq/FAQ199  (This link is being provided for informational/ educational  purposes only.)      PHQ2/9:    10/23/2022    9:05 AM 07/24/2022    8:48 AM 06/18/2022    8:30 AM 01/30/2022    3:02 PM 01/03/2022    8:26 AM  Depression screen PHQ 2/9  Decreased Interest 0 0 0 0 0  Down, Depressed, Hopeless 0 0 0 0 0  PHQ - 2 Score 0 0 0 0 0  Altered sleeping 0  0 0 0  Tired, decreased energy 0  0 0 0  Change in appetite 0  0 0 0  Feeling bad or failure about yourself  0  0 0 0  Trouble concentrating 0  0 0 0  Moving slowly or fidgety/restless 0  0 0 0  Suicidal thoughts 0  0 0 0  PHQ-9 Score 0  0 0 0  Difficult doing work/chores    Not difficult at all     phq 9 is  negative   Fall Risk:    12/29/2022    8:26 AM 10/23/2022    9:05 AM 07/24/2022    8:44 AM 06/18/2022    8:30 AM 01/30/2022    3:02 PM  Fall Risk   Falls in the past year? 0 0 0 0 0  Number falls in past yr: 0  0 0 0  Injury with Fall? 0  0 0 0  Risk for fall due to :  No Fall Risks No Fall Risks No Fall Risks   Follow up  Falls prevention discussed;Education provided;Falls evaluation completed Education provided;Falls prevention discussed Falls prevention discussed    Assessment & Plan   1. Morbid obesity (HCC) (Primary)  Continue intermittent fasting  2. Atherosclerosis of abdominal aorta (HCC)  On statin therapy   3. Chronic kidney disease, stage 3a (HCC)  Stable, monitored by Dr. Cherylann Ratel   4. Chronic deep vein thrombosis (DVT) of left popliteal vein (HCC)  Taking eliquis   5. Secondary hyperparathyroidism (HCC)  Monitored by nephrologist   6. H/O total adrenalectomy [facility])  Doing well   7. Benign prostatic hyperplasia with urinary obstruction  Stable  8. Erectile dysfunction, unspecified erectile dysfunction type  - tadalafil (CIALIS) 5 MG tablet; Take 1 tablet (5 mg total) by mouth daily.  Dispense: 90 tablet; Refill: 1  9. Dyslipidemia   10. Asthma, well controlled, moderate persistent  - Fluticasone-Umeclidin-Vilant (TRELEGY ELLIPTA) 100-62.5-25 MCG/ACT  AEPB; Inhale 1 puff into the lungs daily.  Dispense: 1 each; Refill: 5 - montelukast (SINGULAIR) 10 MG tablet; Take 1 tablet (10 mg total) by mouth daily.  Dispense: 30 tablet; Refill: 5  11. Intermittent low back pain  - tiZANidine (ZANAFLEX) 2 MG tablet; Take 1 tablet (2 mg total) by mouth 2 (two) times daily as needed for muscle spasms.  Dispense: 60 tablet; Refill: 5

## 2022-12-23 ENCOUNTER — Other Ambulatory Visit: Payer: Self-pay | Admitting: Family Medicine

## 2022-12-24 ENCOUNTER — Other Ambulatory Visit: Payer: Self-pay | Admitting: Family Medicine

## 2022-12-24 DIAGNOSIS — J4540 Moderate persistent asthma, uncomplicated: Secondary | ICD-10-CM

## 2022-12-29 ENCOUNTER — Ambulatory Visit (INDEPENDENT_AMBULATORY_CARE_PROVIDER_SITE_OTHER): Payer: Medicare Other | Admitting: Family Medicine

## 2022-12-29 ENCOUNTER — Encounter: Payer: Self-pay | Admitting: Family Medicine

## 2022-12-29 DIAGNOSIS — N529 Male erectile dysfunction, unspecified: Secondary | ICD-10-CM | POA: Diagnosis not present

## 2022-12-29 DIAGNOSIS — I700 Atherosclerosis of aorta: Secondary | ICD-10-CM

## 2022-12-29 DIAGNOSIS — N138 Other obstructive and reflux uropathy: Secondary | ICD-10-CM | POA: Diagnosis not present

## 2022-12-29 DIAGNOSIS — N1831 Chronic kidney disease, stage 3a: Secondary | ICD-10-CM | POA: Diagnosis not present

## 2022-12-29 DIAGNOSIS — N401 Enlarged prostate with lower urinary tract symptoms: Secondary | ICD-10-CM

## 2022-12-29 DIAGNOSIS — M545 Low back pain, unspecified: Secondary | ICD-10-CM

## 2022-12-29 DIAGNOSIS — E785 Hyperlipidemia, unspecified: Secondary | ICD-10-CM

## 2022-12-29 DIAGNOSIS — E896 Postprocedural adrenocortical (-medullary) hypofunction: Secondary | ICD-10-CM | POA: Diagnosis not present

## 2022-12-29 DIAGNOSIS — N2581 Secondary hyperparathyroidism of renal origin: Secondary | ICD-10-CM | POA: Diagnosis not present

## 2022-12-29 DIAGNOSIS — J4540 Moderate persistent asthma, uncomplicated: Secondary | ICD-10-CM | POA: Diagnosis not present

## 2022-12-29 DIAGNOSIS — I82532 Chronic embolism and thrombosis of left popliteal vein: Secondary | ICD-10-CM | POA: Diagnosis not present

## 2022-12-29 MED ORDER — TIZANIDINE HCL 2 MG PO TABS: 2.0000 mg | ORAL_TABLET | Freq: Two times a day (BID) | ORAL | 5 refills | Status: DC | PRN

## 2022-12-29 MED ORDER — TRELEGY ELLIPTA 100-62.5-25 MCG/ACT IN AEPB: 1.0000 | INHALATION_SPRAY | Freq: Every day | RESPIRATORY_TRACT | 5 refills | Status: DC

## 2022-12-29 MED ORDER — TADALAFIL 5 MG PO TABS: 5.0000 mg | ORAL_TABLET | Freq: Every morning | ORAL | 1 refills | Status: DC

## 2022-12-29 MED ORDER — MONTELUKAST SODIUM 10 MG PO TABS: 10.0000 mg | ORAL_TABLET | Freq: Every day | ORAL | 5 refills | Status: DC

## 2022-12-29 MED ORDER — TADALAFIL 5 MG PO TABS: 5.0000 mg | ORAL_TABLET | Freq: Every day | ORAL | 1 refills | Status: DC

## 2022-12-29 MED ORDER — APIXABAN 5 MG PO TABS: 5.0000 mg | ORAL_TABLET | Freq: Two times a day (BID) | ORAL | 5 refills | Status: DC

## 2023-01-05 ENCOUNTER — Encounter: Payer: Self-pay | Admitting: Oncology

## 2023-01-09 ENCOUNTER — Other Ambulatory Visit: Payer: Self-pay | Admitting: Family Medicine

## 2023-01-09 DIAGNOSIS — J302 Other seasonal allergic rhinitis: Secondary | ICD-10-CM

## 2023-01-10 ENCOUNTER — Other Ambulatory Visit: Payer: Self-pay | Admitting: Family Medicine

## 2023-01-10 DIAGNOSIS — K219 Gastro-esophageal reflux disease without esophagitis: Secondary | ICD-10-CM

## 2023-04-08 DIAGNOSIS — N1831 Chronic kidney disease, stage 3a: Secondary | ICD-10-CM | POA: Diagnosis not present

## 2023-04-08 DIAGNOSIS — N2581 Secondary hyperparathyroidism of renal origin: Secondary | ICD-10-CM | POA: Diagnosis not present

## 2023-04-08 DIAGNOSIS — D631 Anemia in chronic kidney disease: Secondary | ICD-10-CM | POA: Diagnosis not present

## 2023-04-08 DIAGNOSIS — E875 Hyperkalemia: Secondary | ICD-10-CM | POA: Diagnosis not present

## 2023-04-09 ENCOUNTER — Encounter: Payer: Self-pay | Admitting: Family Medicine

## 2023-04-09 DIAGNOSIS — D631 Anemia in chronic kidney disease: Secondary | ICD-10-CM | POA: Diagnosis not present

## 2023-04-09 DIAGNOSIS — N2581 Secondary hyperparathyroidism of renal origin: Secondary | ICD-10-CM | POA: Diagnosis not present

## 2023-04-09 DIAGNOSIS — E875 Hyperkalemia: Secondary | ICD-10-CM | POA: Diagnosis not present

## 2023-04-09 DIAGNOSIS — N1831 Chronic kidney disease, stage 3a: Secondary | ICD-10-CM | POA: Diagnosis not present

## 2023-04-29 ENCOUNTER — Other Ambulatory Visit: Payer: Self-pay | Admitting: Family Medicine

## 2023-04-29 DIAGNOSIS — J301 Allergic rhinitis due to pollen: Secondary | ICD-10-CM

## 2023-05-04 ENCOUNTER — Other Ambulatory Visit: Payer: Self-pay | Admitting: Family Medicine

## 2023-05-04 DIAGNOSIS — J4540 Moderate persistent asthma, uncomplicated: Secondary | ICD-10-CM

## 2023-06-29 ENCOUNTER — Ambulatory Visit (INDEPENDENT_AMBULATORY_CARE_PROVIDER_SITE_OTHER): Payer: Self-pay | Admitting: Family Medicine

## 2023-06-29 ENCOUNTER — Encounter: Payer: Self-pay | Admitting: Oncology

## 2023-06-29 ENCOUNTER — Encounter: Payer: Self-pay | Admitting: Family Medicine

## 2023-06-29 VITALS — BP 128/76 | HR 88 | Temp 98.1°F | Resp 16 | Ht 74.0 in | Wt 270.4 lb

## 2023-06-29 DIAGNOSIS — D638 Anemia in other chronic diseases classified elsewhere: Secondary | ICD-10-CM

## 2023-06-29 DIAGNOSIS — E785 Hyperlipidemia, unspecified: Secondary | ICD-10-CM | POA: Diagnosis not present

## 2023-06-29 DIAGNOSIS — I700 Atherosclerosis of aorta: Secondary | ICD-10-CM

## 2023-06-29 DIAGNOSIS — I82532 Chronic embolism and thrombosis of left popliteal vein: Secondary | ICD-10-CM | POA: Diagnosis not present

## 2023-06-29 DIAGNOSIS — N138 Other obstructive and reflux uropathy: Secondary | ICD-10-CM | POA: Diagnosis not present

## 2023-06-29 DIAGNOSIS — E896 Postprocedural adrenocortical (-medullary) hypofunction: Secondary | ICD-10-CM | POA: Diagnosis not present

## 2023-06-29 DIAGNOSIS — N2581 Secondary hyperparathyroidism of renal origin: Secondary | ICD-10-CM

## 2023-06-29 DIAGNOSIS — K219 Gastro-esophageal reflux disease without esophagitis: Secondary | ICD-10-CM

## 2023-06-29 DIAGNOSIS — N401 Enlarged prostate with lower urinary tract symptoms: Secondary | ICD-10-CM | POA: Diagnosis not present

## 2023-06-29 DIAGNOSIS — J4540 Moderate persistent asthma, uncomplicated: Secondary | ICD-10-CM | POA: Diagnosis not present

## 2023-06-29 DIAGNOSIS — N1831 Chronic kidney disease, stage 3a: Secondary | ICD-10-CM

## 2023-06-29 DIAGNOSIS — M5410 Radiculopathy, site unspecified: Secondary | ICD-10-CM

## 2023-06-29 MED ORDER — AIRSUPRA 90-80 MCG/ACT IN AERO: 2.0000 | INHALATION_SPRAY | Freq: Four times a day (QID) | RESPIRATORY_TRACT | 2 refills | Status: AC | PRN

## 2023-06-29 NOTE — Progress Notes (Signed)
 Name: [name]   MRN: [id number]    DOB: [birth year]   Date:06/29/2023       Progress Note  Subjective  Chief Complaint  Chief Complaint  Patient presents with   Medical Management of Chronic Issues    Stopped taking several medications a few months ago   Foot Problem    L foot around ankle has some soreness on/off x3 months, line shows up around ankle and can get itchy   Discussed the use of AI scribe software for clinical note transcription with the patient, who gave verbal consent to proceed.  History of Present Illness [name] is a 73 year old male with chronic kidney disease and secondary hyperparathyroidism who presents for a six-month follow-up.  He has chronic kidney disease stage 3A and secondary hyperparathyroidism. He is not currently taking any medications for his kidney condition and has discontinued Veltassa . His blood pressure remains stable without medication. He also has anemia of chronic disease with low hemoglobin levels.  He has a history of atherosclerosis of the aorta and high cholesterol, for which he is taking rosuvastatin  and Eliquis . He recalls experiencing a left calf thrombosis in 2019 following knee surgery in December 2018. He occasionally experiences soreness, itching, and scabbing on his left leg near medial ankle   He has moderate persistent asthma and uses albuterol  two to three times a week. He has discontinued Trelegy and Singulair , stating he is breathing well without them. He reports no coughing, wheezing, or sneezing.  He underwent a total adrenalectomy, which has helped control his blood pressure. He has a history of high blood pressure.  He has chronic deep vein thrombosis and has been on Eliquis  since 2019. He has not consulted a vascular doctor since his knee surgery in December 2018, which led to a clot.  He has stopped several medications, including tamsulosin  and Cialis , due to side effects and personal preference. He  associates a droopy eyelid with tamsulosin  use.  He discusses weight management, noting his BMI is now below 35 due to eating less. He has stopped taking Pepcid  for reflux as he no longer experiences heartburn or indigestion.  He mentions a family history of anemia, noting his daughter and grandson also have low hemoglobin. He is aware of his grandson's iron deficiency anemia.    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/2020   Osteoarthritis, multiple sites 06/16/2019   Morbid obesity (HCC) 06/16/2019   Atherosclerosis of abdominal aorta (HCC) 09/15/2017   Chronic deep vein thrombosis (DVT) of left popliteal vein (HCC) 06/26/2017   History of benign neoplasm of adrenal gland 02/11/2016   History of iron deficiency anemia 10/10/2015   BPH (benign prostatic hyperplasia) 06/20/2015   ED (erectile dysfunction) 06/20/2015   Allergic rhinitis, seasonal 06/20/2015   Anemia of chronic disease 06/20/2015   Hypogonadism in male 06/20/2015   Asthma, well controlled, moderate persistent 06/20/2015   Hyperglycemia 06/20/2015   History of shingles 06/20/2015   GERD without esophagitis 06/20/2015   Chronic radicular low back pain 06/20/2015   History of epilepsy 06/20/2015   Migraine without aura and without status migrainosus, not intractable 06/20/2015   Dyslipidemia 06/20/2015   Primary osteoarthritis of both knees 06/20/2015    Past Surgical History:  Procedure Laterality Date   ADRENALECTOMY Left 02/11/2016   [facility]   COLONOSCOPY  02/2012   normal   JOINT REPLACEMENT     KNEE ARTHROSCOPY Left 10/06/2009  SINUS EXPLORATION     TOTAL HIP ARTHROPLASTY Left 12/14/2017   Procedure: TOTAL HIP ARTHROPLASTY ANTERIOR APPROACH;  Surgeon: Yvone Rush, MD;  Location: [facility];  Service: Orthopedics;  Laterality: Left;   TOTAL KNEE ARTHROPLASTY Right 06/20/2016   TOTAL KNEE ARTHROPLASTY Right 06/20/2016   Procedure: TOTAL KNEE ARTHROPLASTY;  Surgeon:  Yvone Rush, MD;  Location: [facility];  Service: Orthopedics;  Laterality: Right;   TOTAL KNEE ARTHROPLASTY Left 12/19/2016   Procedure: LEFT TOTAL KNEE ARTHROPLASTY;  Surgeon: Yvone Rush, MD;  Location: [facility];  Service: Orthopedics;  Laterality: Left;  Adductor Block    Family History  Problem Relation Age of Onset   Diabetes Mother    Heart disease Mother    Lung disease Mother    Seizures Maternal Grandmother    Alzheimer's disease Brother     Social History   Tobacco Use   Smoking status: Former    Current packs/day: 0.00    Average packs/day: 1 pack/day for 10.0 years (10.0 ttl pk-yrs)    Types: Cigarettes    Start date: 35    Quit date: 1980    Years since quitting: 45.5   Smokeless tobacco: Never   Tobacco comments:    38 years ago 24 when he stopped  Substance Use Topics   Alcohol use: No    Alcohol/week: 0.0 standard drinks of alcohol     Current Outpatient Medications:    albuterol  (VENTOLIN  HFA) 108 (90 Base) MCG/ACT inhaler, INHALE 2 PUFFS INTO THE LUNGS EVERY 4 HOURS AS NEEDED FOR WHEEZING OR SHORTNESS OF BREATH., Disp: 8.5 each, Rfl: 0   apixaban  (ELIQUIS ) 5 MG TABS tablet, Take 1 tablet (5 mg total) by mouth 2 (two) times daily., Disp: 60 tablet, Rfl: 5   azelastine  (OPTIVAR ) 0.05 % ophthalmic solution, PLACE 2 DROPS INTO BOTH EYES 2 (TWO) TIMES DAILY., Disp: 18 mL, Rfl: 5   fluticasone  (FLONASE ) 50 MCG/ACT nasal spray, SPRAY 2 SPRAYS INTO EACH NOSTRIL EVERY DAY, Disp: 48 mL, Rfl: 1   Multiple Vitamins-Minerals (MULTIVITAMIN ADULTS 50+ PO), Take 1 tablet by mouth daily. NATURE'S CODE MEN OVER 50 MULTIVITAMIN PACK, Disp: , Rfl:    Polyethyl Glycol-Propyl Glycol 0.4-0.3 % SOLN, Place 1-2 drops into both eyes 3 (three) times daily as needed (for dry/irritated eyes.)., Disp: , Rfl:    rosuvastatin  (CRESTOR ) 40 MG tablet, TAKE 1 TABLET (40 MG TOTAL) BY MOUTH DAILY. IN PLACE OF ATORVASTATIN  FOR CHOLESTEROL, Disp: 90 tablet, Rfl: 3   famotidine  (PEPCID ) 20 MG  tablet, TAKE 1 TABLET BY MOUTH TWICE A DAY (Patient not taking: Reported on 06/29/2023), Disp: 180 tablet, Rfl: 1   Fluticasone -Umeclidin-Vilant (TRELEGY ELLIPTA ) 100-62.5-25 MCG/ACT AEPB, Inhale 1 puff into the lungs daily. (Patient not taking: Reported on 06/29/2023), Disp: 1 each, Rfl: 5   montelukast  (SINGULAIR ) 10 MG tablet, Take 1 tablet (10 mg total) by mouth daily. (Patient not taking: Reported on 06/29/2023), Disp: 30 tablet, Rfl: 5   tadalafil  (CIALIS ) 5 MG tablet, Take 1 tablet (5 mg total) by mouth daily., Disp: 90 tablet, Rfl: 1   tadalafil  (CIALIS ) 5 MG tablet, Take 1 tablet (5 mg total) by mouth in the morning. (Patient not taking: Reported on 06/29/2023), Disp: 90 tablet, Rfl: 1   tamsulosin  (FLOMAX ) 0.4 MG CAPS capsule, TAKE 1 CAPSULE BY MOUTH EVERY DAY IN THE EVENING (Patient not taking: Reported on 06/29/2023), Disp: 90 capsule, Rfl: 1   tiZANidine  (ZANAFLEX ) 2 MG tablet, Take 1 tablet (2 mg total) by mouth 2 (two)  times daily as needed for muscle spasms. (Patient not taking: Reported on 06/29/2023), Disp: 60 tablet, Rfl: 5   VELTASSA  8.4 g packet, Take 1 packet by mouth daily. (Patient not taking: Reported on 06/29/2023), Disp: , Rfl:    vitamin B-12 (CYANOCOBALAMIN ) 500 MCG tablet, Take 500 mcg by mouth daily. (Patient not taking: Reported on 06/29/2023), Disp: , Rfl:   Allergies  Allergen Reactions   Almond (Diagnostic) Other (See Comments)    Migraines   Lactose Intolerance (Gi) Other (See Comments)    MIGRAINES   Peanut-Containing Drug Products Other (See Comments)    Migraines   Shellfish Allergy Other (See Comments)    Congestion/breathing problems/migraines.    I personally reviewed active problem list, medication list, allergies with the patient/caregiver today.   ROS  Ten systems reviewed and is negative except as mentioned in HPI    Objective Physical Exam CONSTITUTIONAL: Patient appears well-developed and well-nourished. No distress. HEENT: Head atraumatic,  normocephalic, neck supple. CARDIOVASCULAR: Normal rate, regular rhythm and normal heart sounds. No murmur heard. No BLE edema. PULMONARY: Effort normal and breath sounds normal. No respiratory distress. ABDOMINAL: There is no tenderness or distention. MUSCULOSKELETAL: Normal gait. Without gross motor or sensory deficit. PSYCHIATRIC: Patient has a normal mood and affect. Behavior is normal. Judgment and thought content normal.  Vitals:   06/29/23 0853  BP: 128/76  Pulse: 88  Resp: 16  Temp: 98.1 F (36.7 C)  TempSrc: Oral  SpO2: 98%  Weight: 270 lb 6.4 oz (122.7 kg)  Height: 6' 2 (1.88 m)    Body mass index is 34.72 kg/m.   PHQ2/9:    06/29/2023    8:50 AM 10/23/2022    9:05 AM 07/24/2022    8:48 AM 06/18/2022    8:30 AM 01/30/2022    3:02 PM  Depression screen PHQ 2/9  Decreased Interest 0 0 0 0 0  Down, Depressed, Hopeless 0 0 0 0 0  PHQ - 2 Score 0 0 0 0 0  Altered sleeping 0 0  0 0  Tired, decreased energy 0 0  0 0  Change in appetite 0 0  0 0  Feeling bad or failure about yourself  0 0  0 0  Trouble concentrating 0 0  0 0  Moving slowly or fidgety/restless 0 0  0 0  Suicidal thoughts 0 0  0 0  PHQ-9 Score 0 0  0 0  Difficult doing work/chores Not difficult at all    Not difficult at all    phq 9 is negative  Fall Risk:    06/29/2023    8:49 AM 12/29/2022    8:26 AM 10/23/2022    9:05 AM 07/24/2022    8:44 AM 06/18/2022    8:30 AM  Fall Risk   Falls in the past year? 0 0 0 0 0  Number falls in past yr: 0 0  0 0  Injury with Fall? 0 0  0 0  Risk for fall due to : No Fall Risks  No Fall Risks No Fall Risks No Fall Risks  Follow up Falls prevention discussed;Education provided;Falls evaluation completed  Falls prevention discussed;Education provided;Falls evaluation completed Education provided;Falls prevention discussed Falls prevention discussed    Assessment & Plan Chronic kidney disease, stage 3a CKD stage 3a with GFR 53, well-managed with controlled  blood pressure. Veltassa  discontinued. - Monitor kidney function regularly. - Check ferritin levels during routine blood work. - Communicate with nephrologist regarding supplements.  Secondary hyperparathyroidism of  renal origin Secondary hyperparathyroidism due to CKD with improved parathyroid  levels.  Anemia of chronic disease Anemia with hemoglobin 10.8. Iron function tests normal. Family history of low hemoglobin. - Check ferritin levels during routine blood work.  BPH -he stopped taking flomax  and cialis  on his own due to side effects  Chronic deep vein thrombosis of left leg Chronic DVT of left leg since 2019, post-surgical. On Eliquis . Prefers evaluation by vascular surgeon in [place]. - Refer to vascular surgeon in [place] for evaluation and possible repeat Doppler.  Atherosclerosis of aorta Atherosclerosis with hyperlipidemia, managed with rosuvastatin . - Continue rosuvastatin .  Moderate persistent asthma Moderate persistent asthma with frequent albuterol  use. Prefers as-needed medication. Discontinued Trelegy and Singulair . - Prescribe Airsupra for as-needed use.  Adrenalectomy Adrenalectomy with controlled blood pressure. Previous uncontrolled hypertension likely contributed to CKD.

## 2023-07-08 NOTE — Progress Notes (Unsigned)
 Office Note     CC: DVT and anticoagulation concern Requesting Provider:  Sowles, Krichna, MD  HPI: [name] is a 73 y.o. (14-May-1950) male who presents at the request of Glenard Mire, MD for evaluation of left lower extremity chronic DVT.  On exam today, [name] was doing well.  Patient has a history of left calf DVT in 2019 following knee surgery.  He was placed on anticoagulation, and has been compliant for 6 years.   Venous symptoms include: positive if (X) [  ] aching [  ] heavy [  ] tired  [  ] throbbing [  ] burning  [  ] itching [  ]swelling [  ] bleeding [  ] ulcer  Onset/duration:  ***  Occupation:  *** Aggravating factors: (sitting, standing) Alleviating factors: (elevation) Compression:  *** Helps:  *** Pain medications:  *** Previous vein procedures:  *** History of DVT:  ***   The pt *** on a statin for cholesterol management.  The pt *** on a daily aspirin .   Other AC:  *** The pt *** on *** for hypertension.   The pt *** diabetic.  *** Tobacco hx:  ***  Past Medical History:  Diagnosis Date   Allergic rhinitis    Anemia    BPH (benign prostatic hyperplasia)    Chronic kidney disease    Chronic sinusitis    Complication of anesthesia    work up during surgery 2x in the past    Decreased libido    DVT (deep venous thrombosis) (HCC)    left leg after left knee surgery   ED (erectile dysfunction)    Fatigue    Hematuria    HTN (hypertension)    history of   Hypogonadism in male    Hypokalemia    history of    IBS (irritable bowel syndrome)    Low serum vitamin D     Lumbago    Migraine    Mild intermittent asthma    Osteoarthritis of hip    left (bone-on-bone) Dr. Norleen FREDRIK [facility] MATSU. [place], PA-C   Pneumonia    walking pneumonia   Reflux    Shingles    Unilateral inguinal hernia without obstruction or gangrene     Past Surgical History:  Procedure Laterality Date   ADRENALECTOMY Left 02/11/2016   [facility]    COLONOSCOPY  02/2012   normal   JOINT REPLACEMENT     KNEE ARTHROSCOPY Left 10/06/2009   SINUS EXPLORATION     TOTAL HIP ARTHROPLASTY Left 12/14/2017   Procedure: TOTAL HIP ARTHROPLASTY ANTERIOR APPROACH;  Surgeon: Gavel Norleen, MD;  Location: [facility];  Service: Orthopedics;  Laterality: Left;   TOTAL KNEE ARTHROPLASTY Right 06/20/2016   TOTAL KNEE ARTHROPLASTY Right 06/20/2016   Procedure: TOTAL KNEE ARTHROPLASTY;  Surgeon: Gavel Norleen, MD;  Location: [facility];  Service: Orthopedics;  Laterality: Right;   TOTAL KNEE ARTHROPLASTY Left 12/19/2016   Procedure: LEFT TOTAL KNEE ARTHROPLASTY;  Surgeon: Gavel Norleen, MD;  Location: [facility];  Service: Orthopedics;  Laterality: Left;  Adductor Block    Social History   Socioeconomic History   Marital status: Married    Spouse name: deborah   Number of children: 2   Years of education: Not on file   Highest education level: Master's degree (e.g., MA, MS, MEng, MEd, MSW, MBA)  Occupational History   Occupation: [employer]  Tobacco Use   Smoking status: Former    Current packs/day: 0.00  Average packs/day: 1 pack/day for 10.0 years (10.0 ttl pk-yrs)    Types: Cigarettes    Start date: 41    Quit date: 31    Years since quitting: 45.5   Smokeless tobacco: Never   Tobacco comments:    38 years ago 89 when he stopped  Vaping Use   Vaping status: Never Used  Substance and Sexual Activity   Alcohol use: No    Alcohol/week: 0.0 standard drinks of alcohol   Drug use: No   Sexual activity: Yes    Partners: Female  Other Topics Concern   Not on file  Social History Narrative   Patient has 8 grands   Social Drivers of Corporate investment banker Strain: Low Risk  (07/24/2022)   Overall Financial Resource Strain (CARDIA)    Difficulty of Paying Living Expenses: Not hard at all  Food Insecurity: No Food Insecurity (07/24/2022)   Hunger Vital Sign    Worried About Running Out of Food in the Last Year: Never true    Ran Out of  Food in the Last Year: Never true  Transportation Needs: No Transportation Needs (07/24/2022)   PRAPARE - Administrator, Civil Service (Medical): No    Lack of Transportation (Non-Medical): No  Physical Activity: Inactive (07/24/2022)   Exercise Vital Sign    Days of Exercise per Week: 0 days    Minutes of Exercise per Session: 30 min  Stress: No Stress Concern Present (07/24/2022)   [employer] of Occupational Health - Occupational Stress Questionnaire    Feeling of Stress : Not at all  Social Connections: Socially Integrated (07/24/2022)   Social Connection and Isolation Panel    Frequency of Communication with Friends and Family: More than three times a week    Frequency of Social Gatherings with Friends and Family: Twice a week    Attends Religious Services: More than 4 times per year    Active Member of [employer] or Organizations: Yes    Attends Banker Meetings: 1 to 4 times per year    Marital Status: Married  Catering manager Violence: Not At Risk (07/24/2022)   Humiliation, Afraid, Rape, and Kick questionnaire    Fear of Current or Ex-Partner: No    Emotionally Abused: No    Physically Abused: No    Sexually Abused: No   *** Family History  Problem Relation Age of Onset   Diabetes Mother    Heart disease Mother    Lung disease Mother    Seizures Maternal Grandmother    Alzheimer's disease Brother     Current Outpatient Medications  Medication Sig Dispense Refill   Albuterol -Budesonide (AIRSUPRA ) 90-80 MCG/ACT AERO Inhale 2 puffs into the lungs 4 (four) times daily as needed. 10.7 g 2   apixaban  (ELIQUIS ) 5 MG TABS tablet Take 1 tablet (5 mg total) by mouth 2 (two) times daily. 60 tablet 5   azelastine  (OPTIVAR ) 0.05 % ophthalmic solution PLACE 2 DROPS INTO BOTH EYES 2 (TWO) TIMES DAILY. 18 mL 5   fluticasone  (FLONASE ) 50 MCG/ACT nasal spray SPRAY 2 SPRAYS INTO EACH NOSTRIL EVERY DAY 48 mL 1   Multiple Vitamins-Minerals (MULTIVITAMIN ADULTS 50+  PO) Take 1 tablet by mouth daily. NATURE'S CODE MEN OVER 50 MULTIVITAMIN PACK     Polyethyl Glycol-Propyl Glycol 0.4-0.3 % SOLN Place 1-2 drops into both eyes 3 (three) times daily as needed (for dry/irritated eyes.).     rosuvastatin  (CRESTOR ) 40 MG tablet TAKE 1 TABLET (40  MG TOTAL) BY MOUTH DAILY. IN PLACE OF ATORVASTATIN  FOR CHOLESTEROL 90 tablet 3   No current facility-administered medications for this visit.    Allergies  Allergen Reactions   Almond (Diagnostic) Other (See Comments)    Migraines   Lactose Intolerance (Gi) Other (See Comments)    MIGRAINES   Peanut-Containing Drug Products Other (See Comments)    Migraines   Shellfish Allergy Other (See Comments)    Congestion/breathing problems/migraines.     REVIEW OF SYSTEMS:  *** [X]  denotes positive finding, [ ]  denotes negative finding Cardiac  Comments:  Chest pain or chest pressure:    Shortness of breath upon exertion:    Short of breath when lying flat:    Irregular heart rhythm:        Vascular    Pain in calf, thigh, or hip brought on by ambulation:    Pain in feet at night that wakes you up from your sleep:     Blood clot in your veins:    Leg swelling:         Pulmonary    Oxygen at home:    Productive cough:     Wheezing:         Neurologic    Sudden weakness in arms or legs:     Sudden numbness in arms or legs:     Sudden onset of difficulty speaking or slurred speech:    Temporary loss of vision in one eye:     Problems with dizziness:         Gastrointestinal    Blood in stool:     Vomited blood:         Genitourinary    Burning when urinating:     Blood in urine:        Psychiatric    Major depression:         Hematologic    Bleeding problems:    Problems with blood clotting too easily:        Skin    Rashes or ulcers:        Constitutional    Fever or chills:      PHYSICAL EXAMINATION:  There were no vitals filed for this visit.  General:  WDWN in NAD; vital signs  documented above Gait: Not observed HENT: WNL, normocephalic Pulmonary: normal non-labored breathing , without Rales, rhonchi,  wheezing Cardiac: {Desc; regular/irreg:14544} HR, without  Murmurs {With/Without:20273} carotid bruit*** Abdomen: soft, NT, no masses Skin: {With/Without:20273} rashes Vascular Exam/Pulses:  Right Left  Radial {Exam; arterial pulse strength 0-4:30167} {Exam; arterial pulse strength 0-4:30167}  Ulnar {Exam; arterial pulse strength 0-4:30167} {Exam; arterial pulse strength 0-4:30167}  Femoral {Exam; arterial pulse strength 0-4:30167} {Exam; arterial pulse strength 0-4:30167}  Popliteal {Exam; arterial pulse strength 0-4:30167} {Exam; arterial pulse strength 0-4:30167}  DP {Exam; arterial pulse strength 0-4:30167} {Exam; arterial pulse strength 0-4:30167}  PT {Exam; arterial pulse strength 0-4:30167} {Exam; arterial pulse strength 0-4:30167}   Extremities: {With/Without:20273} ischemic changes, {With/Without:20273} Gangrene , {With/Without:20273} cellulitis; {With/Without:20273} open wounds;  Musculoskeletal: no muscle wasting or atrophy  Neurologic: A&O X 3;  No focal weakness or paresthesias are detected Psychiatric:  The pt has {Desc; normal/abnormal:11317::Normal} affect.   Non-Invasive Vascular Imaging:   ***    ASSESSMENT/PLAN:: 73 y.o. male presenting with chronic, provoked DVT in the left lower extremity continuing to take anticoagulation.  I do long conversation with [name] regarding the above.  Being that the DVT was provoked, literature recommends 3 to 6 months of therapy.  He could have stopped years ago.  There is no data that prolonged use prevents further DVT as long as the incident was provoked-which it was due to previous surgery.  [name] can stop anticoagulation at this time. Should he or Dr. Glenard have further questions regarding anticoagulation, I recommended hematology oncology referral.   ***   Fonda FORBES Rim, MD [facility] [phone]

## 2023-07-09 ENCOUNTER — Encounter: Payer: Self-pay | Admitting: Vascular Surgery

## 2023-07-09 ENCOUNTER — Ambulatory Visit: Attending: Vascular Surgery | Admitting: Vascular Surgery

## 2023-07-09 VITALS — BP 122/78 | HR 80 | Temp 98.8°F | Resp 20 | Ht 74.0 in | Wt 274.0 lb

## 2023-07-09 DIAGNOSIS — I82532 Chronic embolism and thrombosis of left popliteal vein: Secondary | ICD-10-CM | POA: Insufficient documentation

## 2023-07-30 ENCOUNTER — Ambulatory Visit: Payer: Medicare Other

## 2023-07-30 DIAGNOSIS — Z0000 Encounter for general adult medical examination without abnormal findings: Secondary | ICD-10-CM | POA: Diagnosis not present

## 2023-07-30 NOTE — Progress Notes (Signed)
 Subjective:   [name] is a 73 y.o. who presents for a Medicare Wellness preventive visit.  As a reminder, Annual Wellness Visits don't include a physical exam, and some assessments may be limited, especially if this visit is performed virtually. We may recommend an in-person follow-up visit with your provider if needed.  Visit Complete: Virtual I connected with  [name] on 07/30/23 by a audio enabled telemedicine application and verified that I am speaking with the correct person using two identifiers.  Patient Location: Home  Provider Location: Home Office  I discussed the limitations of evaluation and management by telemedicine. The patient expressed understanding and agreed to proceed.  Vital Signs: Because this visit was a virtual/telehealth visit, some criteria may be missing or patient reported. Any vitals not documented were not able to be obtained and vitals that have been documented are patient reported.  VideoDeclined- This patient declined Librarian, academic. Therefore the visit was completed with audio only.  Persons Participating in Visit: Patient.  AWV Questionnaire: No: Patient Medicare AWV questionnaire was not completed prior to this visit.  Cardiac Risk Factors include: advanced age (>48men, >53 women);dyslipidemia;male gender;obesity (BMI >30kg/m2);sedentary lifestyle     Objective:    There were no vitals filed for this visit. There is no height or weight on file to calculate BMI.     07/30/2023    8:17 AM 07/24/2022    8:58 AM 12/14/2017    5:15 PM 12/02/2017   10:48 AM 08/25/2017    2:38 PM 02/26/2017   11:08 AM 12/19/2016   11:00 AM  Advanced Directives  Does Patient Have a Medical Advance Directive? No Yes No  No  No  No  No   Type of Furniture conservator/restorer;Living will       Would patient like information on creating a medical advance directive? No - Patient declined  No -  Patient declined  Yes (MAU/Ambulatory/Procedural Areas - Information given)  No - Patient declined   No - Patient declined      Data saved with a previous flowsheet row definition    Current Medications (verified) Outpatient Encounter Medications as of 07/30/2023  Medication Sig   Albuterol -Budesonide (AIRSUPRA ) 90-80 MCG/ACT AERO Inhale 2 puffs into the lungs 4 (four) times daily as needed.   azelastine  (OPTIVAR ) 0.05 % ophthalmic solution PLACE 2 DROPS INTO BOTH EYES 2 (TWO) TIMES DAILY.   fluticasone  (FLONASE ) 50 MCG/ACT nasal spray SPRAY 2 SPRAYS INTO EACH NOSTRIL EVERY DAY   Multiple Vitamins-Minerals (MULTIVITAMIN ADULTS 50+ PO) Take 1 tablet by mouth daily. NATURE'S CODE MEN OVER 50 MULTIVITAMIN PACK   Polyethyl Glycol-Propyl Glycol 0.4-0.3 % SOLN Place 1-2 drops into both eyes 3 (three) times daily as needed (for dry/irritated eyes.).   rosuvastatin  (CRESTOR ) 40 MG tablet TAKE 1 TABLET (40 MG TOTAL) BY MOUTH DAILY. IN PLACE OF ATORVASTATIN  FOR CHOLESTEROL   apixaban  (ELIQUIS ) 5 MG TABS tablet Take 1 tablet (5 mg total) by mouth 2 (two) times daily. (Patient not taking: Reported on 07/30/2023)   No facility-administered encounter medications on file as of 07/30/2023.    Allergies (verified) Almond (diagnostic), Lactose intolerance (gi), Peanut-containing drug products, and Shellfish allergy   History: Past Medical History:  Diagnosis Date   Allergic rhinitis    Anemia    BPH (benign prostatic hyperplasia)    Chronic kidney disease    Chronic sinusitis    Complication of anesthesia    work up  during surgery 2x in the past    Decreased libido    DVT (deep venous thrombosis) (HCC)    left leg after left knee surgery   ED (erectile dysfunction)    Fatigue    Hematuria    HTN (hypertension)    history of   Hypogonadism in male    Hypokalemia    history of    IBS (irritable bowel syndrome)    Low serum vitamin D     Lumbago    Migraine    Mild intermittent asthma     Osteoarthritis of hip    left (bone-on-bone) Dr. Norleen FREDRIK [facility] MATSU. [place], PA-C   Pneumonia    walking pneumonia   Reflux    Shingles    Unilateral inguinal hernia without obstruction or gangrene    Past Surgical History:  Procedure Laterality Date   ADRENALECTOMY Left 02/11/2016   [facility]   COLONOSCOPY  02/2012   normal   JOINT REPLACEMENT     KNEE ARTHROSCOPY Left 10/06/2009   SINUS EXPLORATION     TOTAL HIP ARTHROPLASTY Left 12/14/2017   Procedure: TOTAL HIP ARTHROPLASTY ANTERIOR APPROACH;  Surgeon: Gavel Norleen, MD;  Location: [facility] OR;  Service: Orthopedics;  Laterality: Left;   TOTAL KNEE ARTHROPLASTY Right 06/20/2016   TOTAL KNEE ARTHROPLASTY Right 06/20/2016   Procedure: TOTAL KNEE ARTHROPLASTY;  Surgeon: Gavel Norleen, MD;  Location: [facility] OR;  Service: Orthopedics;  Laterality: Right;   TOTAL KNEE ARTHROPLASTY Left 12/19/2016   Procedure: LEFT TOTAL KNEE ARTHROPLASTY;  Surgeon: Gavel Norleen, MD;  Location: [facility];  Service: Orthopedics;  Laterality: Left;  Adductor Block   Family History  Problem Relation Age of Onset   Diabetes Mother    Heart disease Mother    Lung disease Mother    Seizures Maternal Grandmother    Alzheimer's disease Brother    Social History   Socioeconomic History   Marital status: Married    Spouse name: [name]   Number of children: 2   Years of education: Not on file   Highest education level: Master's degree (e.g., MA, MS, MEng, MEd, MSW, MBA)  Occupational History   Occupation: [employer]  Tobacco Use   Smoking status: Former    Current packs/day: 0.00    Average packs/day: 1 pack/day for 10.0 years (10.0 ttl pk-yrs)    Types: Cigarettes    Start date: 31    Quit date: 1980    Years since quitting: 45.5   Smokeless tobacco: Never   Tobacco comments:    39 years ago 38 when he stopped  Vaping Use   Vaping status: Never Used  Substance and Sexual Activity   Alcohol use: No    Alcohol/week: 0.0 standard drinks of  alcohol   Drug use: No   Sexual activity: Yes    Partners: Female  Other Topics Concern   Not on file  Social History Narrative   Patient has 8 grands   Social Drivers of Corporate investment banker Strain: Low Risk  (07/30/2023)   Overall Financial Resource Strain (CARDIA)    Difficulty of Paying Living Expenses: Not hard at all  Food Insecurity: No Food Insecurity (07/30/2023)   Hunger Vital Sign    Worried About Running Out of Food in the Last Year: Never true    Ran Out of Food in the Last Year: Never true  Transportation Needs: No Transportation Needs (07/30/2023)   PRAPARE - Transportation    Lack of Transportation (  Medical): No    Lack of Transportation (Non-Medical): No  Physical Activity: Inactive (07/30/2023)   Exercise Vital Sign    Days of Exercise per Week: 0 days    Minutes of Exercise per Session: 0 min  Stress: No Stress Concern Present (07/30/2023)   [employer] of Occupational Health - Occupational Stress Questionnaire    Feeling of Stress: Not at all  Social Connections: Moderately Integrated (07/30/2023)   Social Connection and Isolation Panel    Frequency of Communication with Friends and Family: More than three times a week    Frequency of Social Gatherings with Friends and Family: Twice a week    Attends Religious Services: More than 4 times per year    Active Member of [employer] or Organizations: No    Attends Engineer, structural: Never    Marital Status: Married    Tobacco Counseling Counseling given: Not Answered Tobacco comments: 38 years ago 38 when he stopped    Clinical Intake:  Pre-visit preparation completed: Yes  Pain : No/denies pain     BMI - recorded: 35.2 Nutritional Status: BMI > 30  Obese Nutritional Risks: None Diabetes: No  Lab Results  Component Value Date   HGBA1C 5.5 09/15/2017   HGBA1C 5.0 01/26/2017   HGBA1C 5.3 06/20/2015     How often do you need to have someone help you when you read instructions,  pamphlets, or other written materials from your doctor or pharmacy?: 1 - Never  Interpreter Needed?: No  Information entered by :: JHONNIE DAS, LPN   Activities of Daily Living    07/30/2023    8:19 AM 10/23/2022    9:05 AM  In your present state of health, do you have any difficulty performing the following activities:  Hearing? 1 0  Vision? 0 0  Difficulty concentrating or making decisions? 0 0  Walking or climbing stairs? 0 0  Dressing or bathing? 0 0  Doing errands, shopping? 0 0  Preparing Food and eating ? N   Using the Toilet? N   In the past six months, have you accidently leaked urine? N   Do you have problems with loss of bowel control? N   Managing your Medications? N   Managing your Finances? N   Housekeeping or managing your Housekeeping? N     Patient Care Team: Sowles, Krichna, MD as PCP - General (Family Medicine) Marcelino Havens, MD as Referring Physician (Internal Medicine) Yvone Rush, MD as Consulting Physician (Orthopedic Surgery) Melanee Annah BROCKS, MD as Consulting Physician (Oncology) Rosalind Zachary NOVAK, MD (Inactive) as Consulting Physician (Urology)  I have updated your Care Teams any recent Medical Services you may have received from other providers in the past year.     Assessment:   This is a routine wellness examination for [place].  Hearing/Vision screen Hearing Screening - Comments:: BOUGHT HEARING AIDS ONLINE- WEARS OCCASIONALLY Vision Screening - Comments:: WEARS GLASSES ALL DAY- MY EYE DOCTOR   Goals Addressed             This Visit's Progress    DIET - EAT MORE FRUITS AND VEGETABLES         Depression Screen     07/30/2023    8:15 AM 06/29/2023    8:50 AM 10/23/2022    9:05 AM 07/24/2022    8:48 AM 06/18/2022    8:30 AM 01/30/2022    3:02 PM 01/03/2022    8:26 AM  PHQ 2/9 Scores  PHQ - 2 Score  0 0 0 0 0 0 0  PHQ- 9 Score 0 0 0  0 0 0    Fall Risk     07/30/2023    8:19 AM 06/29/2023    8:49 AM 12/29/2022    8:26 AM  10/23/2022    9:05 AM 07/24/2022    8:44 AM  Fall Risk   Falls in the past year? 0 0 0 0 0  Number falls in past yr: 0 0 0  0  Injury with Fall? 0 0 0  0  Risk for fall due to : No Fall Risks No Fall Risks  No Fall Risks No Fall Risks  Follow up Falls evaluation completed;Falls prevention discussed Falls prevention discussed;Education provided;Falls evaluation completed  Falls prevention discussed;Education provided;Falls evaluation completed Education provided;Falls prevention discussed    MEDICARE RISK AT HOME:  Medicare Risk at Home Any stairs in or around the home?: Yes If so, are there any without handrails?: No Home free of loose throw rugs in walkways, pet beds, electrical cords, etc?: Yes Adequate lighting in your home to reduce risk of falls?: Yes Life alert?: No Use of a cane, walker or w/c?: No Grab bars in the bathroom?: Yes Shower chair or bench in shower?: Yes Elevated toilet seat or a handicapped toilet?: Yes  TIMED UP AND GO:  Was the test performed?  No  Cognitive Function: 6CIT completed        07/30/2023    8:22 AM 07/24/2022    8:53 AM  6CIT Screen  What Year? 0 points 0 points  What month? 0 points 0 points  What time? 0 points 0 points  Count back from 20 0 points 0 points  Months in reverse 0 points 0 points  Repeat phrase 2 points 0 points  Total Score 2 points 0 points    Immunizations Immunization History  Administered Date(s) Administered   Fluad Quad(high Dose 65+) 10/05/2019, 09/19/2021   Fluad Trivalent(High Dose 65+) 10/23/2022   Hepatitis A, Adult 06/12/2021   Influenza, High Dose Seasonal PF 09/15/2017, 12/18/2018   Influenza, Seasonal, Injecte, Preservative Fre 12/08/2011   Influenza,inj,Quad PF,6+ Mos 10/21/2012, 10/31/2014, 10/10/2015, 10/13/2016   Influenza-Unspecified 10/21/2012, 11/30/2020   Moderna Sars-Covid-2 Vaccination 02/04/2019, 03/04/2019, 11/11/2019, 08/18/2020, 11/27/2020   Pneumococcal Conjugate-13 02/29/2016    Pneumococcal Polysaccharide-23 04/01/2012, 07/02/2017   Respiratory Syncytial Virus Vaccine,Recomb Aduvanted(Arexvy) 10/23/2022   Tdap 12/08/2011, 01/03/2022   Zoster Recombinant(Shingrix ) 11/30/2020, 06/03/2021   Zoster, Live 12/08/2011    Screening Tests Health Maintenance  Topic Date Due   COVID-19 Vaccine (6 - 2024-25 season) 09/07/2022   INFLUENZA VACCINE  08/07/2023   Medicare Annual Wellness (AWV)  07/29/2024   Colonoscopy  12/06/2024   DTaP/Tdap/Td (3 - Td or Tdap) 01/04/2032   Pneumococcal Vaccine: 50+ Years  Completed   Hepatitis C Screening  Completed   Zoster Vaccines- Shingrix   Completed   Hepatitis B Vaccines  Aged Out   HPV VACCINES  Aged Out   Meningococcal B Vaccine  Aged Out    Health Maintenance  Health Maintenance Due  Topic Date Due   COVID-19 Vaccine (6 - 2024-25 season) 09/07/2022   Health Maintenance Items Addressed: UP TO DATE ON COLONOSCOPY; UP TO DATE ON SHOTS EXCEPT PNA & COVID  Additional Screening:  Vision Screening: Recommended annual ophthalmology exams for early detection of glaucoma and other disorders of the eye. Would you like a referral to an eye doctor? No    Dental Screening: Recommended annual dental exams for proper oral  hygiene  Community Resource Referral / Chronic Care Management: CRR required this visit?  No   CCM required this visit?  No   Plan:    I have personally reviewed and noted the following in the patient's chart:   Medical and social history Use of alcohol, tobacco or illicit drugs  Current medications and supplements including opioid prescriptions. Patient is not currently taking opioid prescriptions. Functional ability and status Nutritional status Physical activity Advanced directives List of other physicians Hospitalizations, surgeries, and ER visits in previous 12 months Vitals Screenings to include cognitive, depression, and falls Referrals and appointments  In addition, I have reviewed and  discussed with patient certain preventive protocols, quality metrics, and best practice recommendations. A written personalized care plan for preventive services as well as general preventive health recommendations were provided to patient.   Jhonnie GORMAN Das, LPN   [id number]   After Visit Summary: (MyChart) Due to this being a telephonic visit, the after visit summary with patients personalized plan was offered to patient via MyChart   Notes: Nothing significant to report at this time.

## 2023-07-30 NOTE — Patient Instructions (Signed)
 Mr. [name] , Thank you for taking time out of your busy schedule to complete your Annual Wellness Visit with me. I enjoyed our conversation and look forward to speaking with you again next year. I, as well as your care team,  appreciate your ongoing commitment to your health goals. Please review the following plan we discussed and let me know if I can assist you in the future.   Follow up Visits: Next Medicare AWV with our clinical staff:   08/04/24 @ 8:10 AM BY PHONE Have you seen your provider in the last 6 months (3 months if uncontrolled diabetes)? Yes  Clinician Recommendations:  Aim for 30 minutes of exercise or brisk walking, 6-8 glasses of water , and 5 servings of fruits and vegetables each day. TAKE CARE!      This is a list of the screening recommended for you and due dates:  Health Maintenance  Topic Date Due   COVID-19 Vaccine (6 - 2024-25 season) 09/07/2022   Flu Shot  08/07/2023   Medicare Annual Wellness Visit  07/29/2024   Colon Cancer Screening  12/06/2024   DTaP/Tdap/Td vaccine (3 - Td or Tdap) 01/04/2032   Pneumococcal Vaccine for age over 47  Completed   Hepatitis C Screening  Completed   Zoster (Shingles) Vaccine  Completed   Hepatitis B Vaccine  Aged Out   HPV Vaccine  Aged Out   Meningitis B Vaccine  Aged Out    Advanced directives: (ACP Link)Information on Advanced Care Planning can be found at [place]  Print production planner Health Care Directives Advance Health Care Directives. [email or web address]  Advance Care Planning is important because it:  [x]  Makes sure you receive the medical care that is consistent with your values, goals, and preferences  [x]  It provides guidance to your family and loved ones and reduces their decisional burden about whether or not they are making the right decisions based on your wishes.  Follow the link provided in your after visit summary or read over the paperwork we have mailed to you to help you started getting your  Advance Directives in place. If you need assistance in completing these, please reach out to us  so that we can help you!

## 2023-09-11 DIAGNOSIS — N1831 Chronic kidney disease, stage 3a: Secondary | ICD-10-CM | POA: Diagnosis not present

## 2023-09-11 DIAGNOSIS — D631 Anemia in chronic kidney disease: Secondary | ICD-10-CM | POA: Diagnosis not present

## 2023-09-11 DIAGNOSIS — E875 Hyperkalemia: Secondary | ICD-10-CM | POA: Diagnosis not present

## 2023-09-11 DIAGNOSIS — N2581 Secondary hyperparathyroidism of renal origin: Secondary | ICD-10-CM | POA: Diagnosis not present

## 2023-09-15 DIAGNOSIS — E875 Hyperkalemia: Secondary | ICD-10-CM | POA: Diagnosis not present

## 2023-09-15 DIAGNOSIS — D631 Anemia in chronic kidney disease: Secondary | ICD-10-CM | POA: Diagnosis not present

## 2023-09-15 DIAGNOSIS — N182 Chronic kidney disease, stage 2 (mild): Secondary | ICD-10-CM | POA: Diagnosis not present

## 2023-10-26 NOTE — Patient Instructions (Signed)
 Preventive Care 73 Years and Older, Male Preventive care refers to lifestyle choices and visits with your health care provider that can promote health and wellness. Preventive care visits are also called wellness exams. What can I expect for my preventive care visit? Counseling During your preventive care visit, your health care provider may ask about your: Medical history, including: Past medical problems. Family medical history. History of falls. Current health, including: Emotional well-being. Home life and relationship well-being. Sexual activity. Memory and ability to understand (cognition). Lifestyle, including: Alcohol, nicotine or tobacco, and drug use. Access to firearms. Diet, exercise, and sleep habits. Work and work Astronomer. Sunscreen use. Safety issues such as seatbelt and bike helmet use. Physical exam Your health care provider will check your: Height and weight. These may be used to calculate your BMI (body mass index). BMI is a measurement that tells if you are at a healthy weight. Waist circumference. This measures the distance around your waistline. This measurement also tells if you are at a healthy weight and may help predict your risk of certain diseases, such as type 2 diabetes and high blood pressure. Heart rate and blood pressure. Body temperature. Skin for abnormal spots. What immunizations do I need?  Vaccines are usually given at various ages, according to a schedule. Your health care provider will recommend vaccines for you based on your age, medical history, and lifestyle or other factors, such as travel or where you work. What tests do I need? Screening Your health care provider may recommend screening tests for certain conditions. This may include: Lipid and cholesterol levels. Diabetes screening. This is done by checking your blood sugar (glucose) after you have not eaten for a while (fasting). Hepatitis C test. Hepatitis B test. HIV (human  immunodeficiency virus) test. STI (sexually transmitted infection) testing, if you are at risk. Lung cancer screening. Colorectal cancer screening. Prostate cancer screening. Abdominal aortic aneurysm (AAA) screening. You may need this if you are a current or former smoker. Talk with your health care provider about your test results, treatment options, and if necessary, the need for more tests. Follow these instructions at home: Eating and drinking  Eat a diet that includes fresh fruits and vegetables, whole grains, lean protein, and low-fat dairy products. Limit your intake of foods with high amounts of sugar, saturated fats, and salt. Take vitamin and mineral supplements as recommended by your health care provider. Do not drink alcohol if your health care provider tells you not to drink. If you drink alcohol: Limit how much you have to 0-2 drinks a day. Know how much alcohol is in your drink. In the [place] one drink equals one 12 oz bottle of beer (355 mL), one 5 oz glass of wine (148 mL), or one 1 oz glass of hard liquor (44 mL). Lifestyle Brush your teeth every morning and night with fluoride toothpaste. Floss one time each day. Exercise for at least 30 minutes 5 or more days each week. Do not use any products that contain nicotine or tobacco. These products include cigarettes, chewing tobacco, and vaping devices, such as e-cigarettes. If you need help quitting, ask your health care provider. Do not use drugs. If you are sexually active, practice safe sex. Use a condom or other form of protection to prevent STIs. Take aspirin only as told by your health care provider. Make sure that you understand how much to take and what form to take. Work with your health care provider to find out whether it is safe  and beneficial for you to take aspirin daily. Ask your health care provider if you need to take a cholesterol-lowering medicine (statin). Find healthy ways to manage stress, such  as: Meditation, yoga, or listening to music. Journaling. Talking to a trusted person. Spending time with friends and family. Safety Always wear your seat belt while driving or riding in a vehicle. Do not drive: If you have been drinking alcohol. Do not ride with someone who has been drinking. When you are tired or distracted. While texting. If you have been using any mind-altering substances or drugs. Wear a helmet and other protective equipment during sports activities. If you have firearms in your house, make sure you follow all gun safety procedures. Minimize exposure to UV radiation to reduce your risk of skin cancer. What's next? Visit your health care provider once a year for an annual wellness visit. Ask your health care provider how often you should have your eyes and teeth checked. Stay up to date on all vaccines. This information is not intended to replace advice given to you by your health care provider. Make sure you discuss any questions you have with your health care provider. Document Revised: 06/20/2020 Document Reviewed: 06/20/2020 Elsevier Patient Education  2024 [employer].

## 2023-10-27 ENCOUNTER — Encounter: Payer: Self-pay | Admitting: Family Medicine

## 2023-10-27 ENCOUNTER — Ambulatory Visit (INDEPENDENT_AMBULATORY_CARE_PROVIDER_SITE_OTHER): Payer: Self-pay | Admitting: Family Medicine

## 2023-10-27 VITALS — BP 118/76 | HR 90 | Resp 16 | Ht 74.0 in | Wt 272.3 lb

## 2023-10-27 DIAGNOSIS — Z23 Encounter for immunization: Secondary | ICD-10-CM

## 2023-10-27 DIAGNOSIS — E785 Hyperlipidemia, unspecified: Secondary | ICD-10-CM | POA: Diagnosis not present

## 2023-10-27 DIAGNOSIS — N401 Enlarged prostate with lower urinary tract symptoms: Secondary | ICD-10-CM

## 2023-10-27 DIAGNOSIS — Z0001 Encounter for general adult medical examination with abnormal findings: Secondary | ICD-10-CM

## 2023-10-27 DIAGNOSIS — E559 Vitamin D deficiency, unspecified: Secondary | ICD-10-CM | POA: Diagnosis not present

## 2023-10-27 DIAGNOSIS — I700 Atherosclerosis of aorta: Secondary | ICD-10-CM

## 2023-10-27 DIAGNOSIS — N529 Male erectile dysfunction, unspecified: Secondary | ICD-10-CM

## 2023-10-27 DIAGNOSIS — N1831 Chronic kidney disease, stage 3a: Secondary | ICD-10-CM

## 2023-10-27 DIAGNOSIS — N138 Other obstructive and reflux uropathy: Secondary | ICD-10-CM

## 2023-10-27 DIAGNOSIS — Z0000 Encounter for general adult medical examination without abnormal findings: Secondary | ICD-10-CM

## 2023-10-27 MED ORDER — ROSUVASTATIN CALCIUM 40 MG PO TABS: 40.0000 mg | ORAL_TABLET | Freq: Every day | ORAL | 3 refills | Status: AC

## 2023-10-27 NOTE — Progress Notes (Signed)
 Name: [name]   MRN: [id number]    DOB: 29-Mar-1950   Date:10/27/2023       Progress Note  Subjective  Chief Complaint  Chief Complaint  Patient presents with   Annual Exam    HPI  Patient presents for annual CPE .   IPSS     Row Name 10/27/23 9083         International Prostate Symptom Score   How often have you had the sensation of not emptying your bladder? More than half the time     How often have you had to urinate less than every two hours? Less than 1 in 5 times     How often have you found you stopped and started again several times when you urinated? More than half the time     How often have you found it difficult to postpone urination? Not at All     How often have you had a weak urinary stream? Less than half the time     How often have you had to strain to start urination? Not at All     How many times did you typically get up at night to urinate? 4 Times     Total IPSS Score 15       Quality of Life due to urinary symptoms   If you were to spend the rest of your life with your urinary condition just the way it is now how would you feel about that? Mostly Satisfied        Diet: eats out about 3 times a week  Exercise: discussed regular physical activity  Last Dental Exam: up to date  Last Eye Exam: he is due for an eye exam   Depression: phq 9 is negative    10/27/2023    9:12 AM 07/30/2023    8:15 AM 06/29/2023    8:50 AM 10/23/2022    9:05 AM 07/24/2022    8:48 AM  Depression screen PHQ 2/9  Decreased Interest 0 0 0 0 0  Down, Depressed, Hopeless 0 0 0 0 0  PHQ - 2 Score 0 0 0 0 0  Altered sleeping  0 0 0   Tired, decreased energy  0 0 0   Change in appetite  0 0 0   Feeling bad or failure about yourself   0 0 0   Trouble concentrating  0 0 0   Moving slowly or fidgety/restless  0 0 0   Suicidal thoughts  0 0 0   PHQ-9 Score  0 0 0   Difficult doing work/chores  Not difficult at all Not difficult at all      Hypertension:  BP  Readings from Last 3 Encounters:  10/27/23 118/76  07/09/23 122/78  06/29/23 128/76    Obesity: Wt Readings from Last 3 Encounters:  10/27/23 272 lb 4.8 oz (123.5 kg)  07/09/23 274 lb (124.3 kg)  06/29/23 270 lb 6.4 oz (122.7 kg)   BMI Readings from Last 3 Encounters:  10/27/23 34.96 kg/m  07/09/23 35.18 kg/m  06/29/23 34.72 kg/m     Flowsheet Row Clinical Support from 07/30/2023 in [facility]  AUDIT-C Score 0     Married STD testing and prevention (HIV/chl/gon/syphilis):  not applicable Sexual history: he has ED - but had side effects, discussed Trimix and he is willing to see Urologist  Hep C Screening: completed Skin cancer: Discussed monitoring for atypical lesions Colorectal cancer: repeat next  year  Prostate cancer:  yes Lab Results  Component Value Date   PSA 1.97 10/23/2022   PSA 1.59 09/19/2021   PSA 2.26 08/06/2020     Lung cancer:  Low Dose CT Chest recommended if Age 63-80 years, 30 pack-year currently smoking OR have quit w/in 15years. Patient  is not a candidate for screening   AAA: normal CT done 2016 ECG:  2022  Vaccines: reviewed with the patient.   Advanced Care Planning: A voluntary discussion about advance care planning including the explanation and discussion of advance directives.  Discussed health care proxy and Living will, and the patient was able to identify a health care proxy as wife.  Patient does not have a living will and power of attorney of health care   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   Osteoarthritis, multiple sites 06/16/2019   Morbid obesity (HCC) 06/16/2019   Atherosclerosis of abdominal aorta 09/15/2017   Chronic deep vein thrombosis (DVT) of left popliteal vein (HCC) 06/26/2017   History of benign neoplasm of adrenal gland 02/11/2016   History  of iron deficiency anemia 10/10/2015   BPH (benign prostatic hyperplasia) 06/20/2015   ED (erectile dysfunction) 06/20/2015   Allergic rhinitis, seasonal 06/20/2015   Anemia of chronic disease 06/20/2015   Hypogonadism in male 06/20/2015   Asthma, well controlled, moderate persistent 06/20/2015   Hyperglycemia 06/20/2015   History of shingles 06/20/2015   GERD without esophagitis 06/20/2015   Chronic radicular low back pain 06/20/2015   History of epilepsy 06/20/2015   Migraine without aura and without status migrainosus, not intractable 06/20/2015   Dyslipidemia 06/20/2015   Primary osteoarthritis of both knees 06/20/2015    Past Surgical History:  Procedure Laterality Date   ADRENALECTOMY Left 02/11/2016   [facility]   COLONOSCOPY  02/2012   normal   JOINT REPLACEMENT     KNEE ARTHROSCOPY Left 10/06/2009   SINUS EXPLORATION     TOTAL HIP ARTHROPLASTY Left 12/14/2017   Procedure: TOTAL HIP ARTHROPLASTY ANTERIOR APPROACH;  Surgeon: Yvone Rush, MD;  Location: [facility] OR;  Service: Orthopedics;  Laterality: Left;   TOTAL KNEE ARTHROPLASTY Right 06/20/2016   TOTAL KNEE ARTHROPLASTY Right 06/20/2016   Procedure: TOTAL KNEE ARTHROPLASTY;  Surgeon: Yvone Rush, MD;  Location: [facility] OR;  Service: Orthopedics;  Laterality: Right;   TOTAL KNEE ARTHROPLASTY Left 12/19/2016   Procedure: LEFT TOTAL KNEE ARTHROPLASTY;  Surgeon: Yvone Rush, MD;  Location: [facility];  Service: Orthopedics;  Laterality: Left;  Adductor Block    Family History  Problem Relation Age of Onset   Diabetes Mother    Heart disease Mother    Lung disease Mother    Seizures Maternal Grandmother    Alzheimer's disease Brother     Social History   Socioeconomic History   Marital status: Married    Spouse name: deborah   Number of children: 2   Years of education: Not on file   Highest education level: Master's degree (e.g., MA, MS, MEng, MEd, MSW, MBA)  Occupational History   Occupation: [employer]  Tobacco  Use   Smoking status: Former    Current packs/day: 0.00    Average packs/day: 1 pack/day for 10.0 years (10.0 ttl pk-yrs)    Types: Cigarettes    Start date: 22    Quit date: 1980    Years since quitting: 45.8  Smokeless tobacco: Never   Tobacco comments:    39 years ago 38 when he stopped  Vaping Use   Vaping status: Never Used  Substance and Sexual Activity   Alcohol use: No    Alcohol/week: 0.0 standard drinks of alcohol   Drug use: No   Sexual activity: Yes    Partners: Female  Other Topics Concern   Not on file  Social History Narrative   Patient has 8 grands   Social Drivers of Corporate investment banker Strain: Low Risk  (07/30/2023)   Overall Financial Resource Strain (CARDIA)    Difficulty of Paying Living Expenses: Not hard at all  Food Insecurity: No Food Insecurity (07/30/2023)   Hunger Vital Sign    Worried About Running Out of Food in the Last Year: Never true    Ran Out of Food in the Last Year: Never true  Transportation Needs: No Transportation Needs (07/30/2023)   PRAPARE - Administrator, Civil Service (Medical): No    Lack of Transportation (Non-Medical): No  Physical Activity: Inactive (07/30/2023)   Exercise Vital Sign    Days of Exercise per Week: 0 days    Minutes of Exercise per Session: 0 min  Stress: No Stress Concern Present (07/30/2023)   [employer] of Occupational Health - Occupational Stress Questionnaire    Feeling of Stress: Not at all  Social Connections: Moderately Integrated (07/30/2023)   Social Connection and Isolation Panel    Frequency of Communication with Friends and Family: More than three times a week    Frequency of Social Gatherings with Friends and Family: Twice a week    Attends Religious Services: More than 4 times per year    Active Member of [employer] or Organizations: No    Attends Banker Meetings: Never    Marital Status: Married  Catering manager Violence: Not At Risk (07/30/2023)    Humiliation, Afraid, Rape, and Kick questionnaire    Fear of Current or Ex-Partner: No    Emotionally Abused: No    Physically Abused: No    Sexually Abused: No     Current Outpatient Medications:    Albuterol -Budesonide (AIRSUPRA ) 90-80 MCG/ACT AERO, Inhale 2 puffs into the lungs 4 (four) times daily as needed., Disp: 10.7 g, Rfl: 2   azelastine  (OPTIVAR ) 0.05 % ophthalmic solution, PLACE 2 DROPS INTO BOTH EYES 2 (TWO) TIMES DAILY., Disp: 18 mL, Rfl: 5   fluticasone  (FLONASE ) 50 MCG/ACT nasal spray, SPRAY 2 SPRAYS INTO EACH NOSTRIL EVERY DAY, Disp: 48 mL, Rfl: 1   Multiple Vitamins-Minerals (MULTIVITAMIN ADULTS 50+ PO), Take 1 tablet by mouth daily. NATURE'S CODE MEN OVER 50 MULTIVITAMIN PACK, Disp: , Rfl:    Polyethyl Glycol-Propyl Glycol 0.4-0.3 % SOLN, Place 1-2 drops into both eyes 3 (three) times daily as needed (for dry/irritated eyes.)., Disp: , Rfl:    rosuvastatin  (CRESTOR ) 40 MG tablet, TAKE 1 TABLET (40 MG TOTAL) BY MOUTH DAILY. IN PLACE OF ATORVASTATIN  FOR CHOLESTEROL, Disp: 90 tablet, Rfl: 3   SODIUM FLUORIDE 5000 SENSITIVE 1.1-5 % GEL, as directed., Disp: , Rfl:    apixaban  (ELIQUIS ) 5 MG TABS tablet, Take 1 tablet (5 mg total) by mouth 2 (two) times daily. (Patient not taking: Reported on 10/27/2023), Disp: 60 tablet, Rfl: 5  Allergies  Allergen Reactions   Almond (Diagnostic) Other (See Comments)    Migraines   Lactose Intolerance (Gi) Other (See Comments)    MIGRAINES   Peanut-Containing Drug Products Other (See Comments)  Migraines   Shellfish Allergy Other (See Comments)    Congestion/breathing problems/migraines.     ROS  Ten systems reviewed and is negative except as mentioned in HPI     Objective  Vitals:   10/27/23 0918  BP: 118/76  Pulse: 90  Resp: 16  SpO2: 98%  Weight: 272 lb 4.8 oz (123.5 kg)  Height: 6' 2 (1.88 m)    Body mass index is 34.96 kg/m.  Physical Exam  Constitutional: Patient appears well-developed and well-nourished. No  distress.  HENT: Head: Normocephalic and atraumatic. Ears: B TMs ok, no erythema or effusion; Nose: Nose normal. Mouth/Throat: Oropharynx is clear and moist. No oropharyngeal exudate.  Eyes: Conjunctivae and EOM are normal. Pupils are equal, round, and reactive to light. No scleral icterus.  Neck: Normal range of motion. Neck supple. No JVD present. No thyromegaly present.  Cardiovascular: Normal rate, regular rhythm and normal heart sounds.  No murmur heard. No BLE edema. Pulmonary/Chest: Effort normal and breath sounds normal. No respiratory distress. Abdominal: Soft. Bowel sounds are normal, no distension. There is no tenderness. no masses. Incisional hernia and umbilical hernia reducible  MALE GENITALIA: Normal descended testes bilaterally, no masses palpated, no hernias, no lesions, no discharge RECTAL: Prostate slightly enlarged  no rectal masses or hemorrhoids  Musculoskeletal: Normal range of motion, no joint effusions. No gross deformities Neurological: he is alert and oriented to person, place, and time. No cranial nerve deficit. Coordination, balance, strength, speech and gait are normal.  Skin: Skin is warm and dry. No rash noted. No erythema.  Psychiatric: Patient has a normal mood and affect. behavior is normal. Judgment and thought content normal.    Assessment & Plan  1. Well adult exam (Primary)   2. Need for influenza vaccination  - Flu vaccine HIGH DOSE PF(Fluzone Trivalent)  3. Chronic kidney disease, stage 3a (HCC)  - Comprehensive metabolic panel with GFR  4. Dyslipidemia  - Lipid panel - rosuvastatin  (CRESTOR ) 40 MG tablet; Take 1 tablet (40 mg total) by mouth daily. In place of Atorvastatin  for cholesterol  Dispense: 90 tablet; Refill: 3  5. Benign prostatic hyperplasia with urinary obstruction  - PSA  6. Erectile dysfunction, unspecified erectile dysfunction type  - Ambulatory referral to Urology  7. Vitamin D  deficiency  - VITAMIN D  25 Hydroxy  (Vit-D Deficiency, Fractures)  8. Atherosclerosis of abdominal aorta  - rosuvastatin  (CRESTOR ) 40 MG tablet; Take 1 tablet (40 mg total) by mouth daily. In place of Atorvastatin  for cholesterol  Dispense: 90 tablet; Refill: 3       -Prostate cancer screening and PSA options (with potential risks and benefits of testing vs not testing) were discussed along with recent recs/guidelines. -USPSTF grade A and B recommendations reviewed with patient; age-appropriate recommendations, preventive care, screening tests, etc discussed and encouraged; healthy living encouraged; see AVS for patient education given to patient -Discussed importance of 150 minutes of physical activity weekly, eat two servings of fish weekly, eat one serving of tree nuts ( cashews, pistachios, pecans, almonds.SABRA) every other day, eat 6 servings of fruit/vegetables daily and drink plenty of water  and avoid sweet beverages.  -Reviewed Health Maintenance: yes

## 2023-10-28 ENCOUNTER — Ambulatory Visit: Payer: Self-pay | Admitting: Family Medicine

## 2023-10-28 LAB — LIPID PANEL
Cholesterol: 202 mg/dL — ABNORMAL HIGH (ref <200)
HDL: 47 mg/dL (ref >=40)
LDL Cholesterol (Calc): 134 mg/dL — ABNORMAL HIGH
Non-HDL Cholesterol (Calc): 155 mg/dL — ABNORMAL HIGH (ref <130)
Total CHOL/HDL Ratio: 4.3 (calc) (ref <5.0)
Triglycerides: 103 mg/dL (ref <150)

## 2023-10-28 LAB — COMPREHENSIVE METABOLIC PANEL WITH GFR
AG Ratio: 1.3 (calc) (ref 1.0–2.5)
ALT: 21 U/L (ref 9–46)
AST: 30 U/L (ref 10–35)
Albumin: 4.3 g/dL (ref 3.6–5.1)
Alkaline phosphatase (APISO): 91 U/L (ref 35–144)
BUN/Creatinine Ratio: 18 (calc) (ref 6–22)
BUN: 24 mg/dL (ref 7–25)
CO2: 28 mmol/L (ref 20–32)
Calcium: 9.9 mg/dL (ref 8.6–10.3)
Chloride: 98 mmol/L (ref 98–110)
Creat: 1.3 mg/dL — ABNORMAL HIGH (ref 0.70–1.28)
Globulin: 3.4 g/dL (ref 1.9–3.7)
Glucose, Bld: 101 mg/dL — ABNORMAL HIGH (ref 65–99)
Potassium: 4.5 mmol/L (ref 3.5–5.3)
Sodium: 134 mmol/L — ABNORMAL LOW (ref 135–146)
Total Bilirubin: 0.6 mg/dL (ref 0.2–1.2)
Total Protein: 7.7 g/dL (ref 6.1–8.1)
eGFR: 58 mL/min/1.73m2 — ABNORMAL LOW (ref >=60)

## 2023-10-28 LAB — VITAMIN D 25 HYDROXY (VIT D DEFICIENCY, FRACTURES): Vit D, 25-Hydroxy: 64 ng/mL (ref 30–100)

## 2023-10-28 LAB — PSA: PSA: 2.57 ng/mL (ref <=4.00)

## 2023-11-03 ENCOUNTER — Encounter: Payer: Self-pay | Admitting: Oncology

## 2023-12-29 ENCOUNTER — Ambulatory Visit (INDEPENDENT_AMBULATORY_CARE_PROVIDER_SITE_OTHER): Admitting: Family Medicine

## 2023-12-29 ENCOUNTER — Encounter: Payer: Self-pay | Admitting: Family Medicine

## 2023-12-29 VITALS — BP 122/74 | HR 84 | Resp 16 | Ht 74.0 in | Wt 283.2 lb

## 2023-12-29 DIAGNOSIS — I700 Atherosclerosis of aorta: Secondary | ICD-10-CM | POA: Diagnosis not present

## 2023-12-29 DIAGNOSIS — E66812 Obesity, class 2: Secondary | ICD-10-CM

## 2023-12-29 DIAGNOSIS — E785 Hyperlipidemia, unspecified: Secondary | ICD-10-CM | POA: Diagnosis not present

## 2023-12-29 DIAGNOSIS — N138 Other obstructive and reflux uropathy: Secondary | ICD-10-CM | POA: Diagnosis not present

## 2023-12-29 DIAGNOSIS — N1831 Chronic kidney disease, stage 3a: Secondary | ICD-10-CM | POA: Diagnosis not present

## 2023-12-29 DIAGNOSIS — D638 Anemia in other chronic diseases classified elsewhere: Secondary | ICD-10-CM

## 2023-12-29 DIAGNOSIS — Z86718 Personal history of other venous thrombosis and embolism: Secondary | ICD-10-CM

## 2023-12-29 DIAGNOSIS — N401 Enlarged prostate with lower urinary tract symptoms: Secondary | ICD-10-CM | POA: Diagnosis not present

## 2023-12-29 DIAGNOSIS — M150 Primary generalized (osteo)arthritis: Secondary | ICD-10-CM

## 2023-12-29 DIAGNOSIS — E896 Postprocedural adrenocortical (-medullary) hypofunction: Secondary | ICD-10-CM

## 2023-12-29 DIAGNOSIS — Z6836 Body mass index (BMI) 36.0-36.9, adult: Secondary | ICD-10-CM

## 2023-12-29 NOTE — Progress Notes (Signed)
 Name: [name]   MRN: [id number]    DOB: 05-Sep-1950   Date:12/29/2023       Progress Note  Subjective  Chief Complaint  Chief Complaint  Patient presents with   Medical Management of Chronic Issues   Discussed the use of AI scribe software for clinical note transcription with the patient, who gave verbal consent to proceed.  History of Present Illness [name] is a 73 year old male who presents for a regular follow-up visit.  He has chronic kidney disease, diagnosed in September, with stable kidney function. His eGFR improved from 45 to 58; he has been told he has stage 3A chronic kidney disease. He manages his condition by staying hydrated and is no longer on Veltassa  as his potassium levels have normalized. He is monitored every six months by his nephrologist.  He has anemia of chronic disease with a hemoglobin level of 10.6, which is stable and considered normal for him. This anemia is likely related to his chronic kidney disease.  He has a history of deep vein thrombosis but is no longer on blood thinners after six years of anticoagulation.  He is classified as morbidly obese with a BMI of 36.36 and is working on [employer] management through regular exercise. His routine includes aerobic activities, strength training, and cycling for about 20 minutes, three times a week, with plans to increase to five days a week.  He has dyslipidemia and is taking rosuvastatin . His LDL cholesterol increased from 71 to 134; he was previously inconsistent with his medication use.  He has benign prostatic hyperplasia with PSA levels ranging from 1.59 to 3.4 over the past five to six years, which is considered normal for him.  He has arthritis and manages joint pain with dietary measures, including a juice blend of ginger, cucumber, lemon, and green tea, which he finds beneficial.  No issues with urination, skin itching, or leg swelling. No current use of blood thinners. No  significant joint pain at present.    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   Osteoarthritis, multiple sites 06/16/2019   Morbid obesity (HCC) 06/16/2019   Atherosclerosis of abdominal aorta 09/15/2017   Chronic deep vein thrombosis (DVT) of left popliteal vein (HCC) 06/26/2017   History of benign neoplasm of adrenal gland 02/11/2016   History of iron deficiency anemia 10/10/2015   BPH (benign prostatic hyperplasia) 06/20/2015   ED (erectile dysfunction) 06/20/2015   Allergic rhinitis, seasonal 06/20/2015   Anemia of chronic disease 06/20/2015   Hypogonadism in male 06/20/2015   Asthma, well controlled, moderate persistent 06/20/2015   Hyperglycemia 06/20/2015   History of shingles 06/20/2015   GERD without esophagitis 06/20/2015   Chronic radicular low back pain 06/20/2015   History of epilepsy 06/20/2015   Migraine without aura and without status migrainosus, not intractable 06/20/2015   Dyslipidemia 06/20/2015   Primary osteoarthritis of both knees 06/20/2015    Past Surgical History:  Procedure Laterality Date   ADRENALECTOMY Left 02/11/2016   [facility]   COLONOSCOPY  02/2012   normal   JOINT REPLACEMENT     KNEE ARTHROSCOPY Left 10/06/2009   SINUS EXPLORATION     TOTAL HIP ARTHROPLASTY Left 12/14/2017   Procedure: TOTAL HIP ARTHROPLASTY ANTERIOR APPROACH;  Surgeon: Yvone Rush, MD;  Location: [facility] OR;  Service: Orthopedics;  Laterality: Left;   TOTAL KNEE ARTHROPLASTY Right  06/20/2016   TOTAL KNEE ARTHROPLASTY Right 06/20/2016   Procedure: TOTAL KNEE ARTHROPLASTY;  Surgeon: Yvone Rush, MD;  Location: [facility] OR;  Service: Orthopedics;  Laterality: Right;   TOTAL KNEE ARTHROPLASTY Left 12/19/2016   Procedure: LEFT TOTAL KNEE ARTHROPLASTY;  Surgeon: Yvone Rush, MD;  Location: [facility];  Service: Orthopedics;  Laterality: Left;  Adductor  Block    Family History  Problem Relation Age of Onset   Diabetes Mother    Heart disease Mother    Lung disease Mother    Seizures Maternal Grandmother    Alzheimer's disease Brother     Social History   Tobacco Use   Smoking status: Former    Current packs/day: 0.00    Average packs/day: 1 pack/day for 10.0 years (10.0 ttl pk-yrs)    Types: Cigarettes    Start date: 27    Quit date: 1980    Years since quitting: 46.0   Smokeless tobacco: Never   Tobacco comments:    39 years ago 38 when he stopped  Substance Use Topics   Alcohol use: No    Alcohol/week: 0.0 standard drinks of alcohol    Current Medications[1]  Allergies[2]  I personally reviewed active problem list, medication list, allergies, family history with the patient/caregiver today.   ROS  Ten systems reviewed and is negative except as mentioned in HPI    Objective Physical Exam  CONSTITUTIONAL: Patient appears well-developed and well-nourished. No distress. HEENT: Head atraumatic, normocephalic, neck supple. CARDIOVASCULAR: Normal rate, regular rhythm and normal heart sounds. No murmur heard. No BLE edema. PULMONARY: Effort normal and breath sounds normal. Lungs clear to auscultation bilaterally. No respiratory distress. ABDOMINAL: There is no tenderness or distention. MUSCULOSKELETAL: Normal gait. Without gross motor or sensory deficit. PSYCHIATRIC: Patient has a normal mood and affect. Behavior is normal. Judgment and thought content normal.  Vitals:   12/29/23 0848  BP: 122/74  Pulse: 84  Resp: 16  SpO2: 95%  Weight: 283 lb 3.2 oz (128.5 kg)  Height: 6' 2 (1.88 m)    Body mass index is 36.36 kg/m.  Recent Results (from the past 2160 hours)  Comprehensive metabolic panel with GFR     Status: Abnormal   Collection Time: 10/27/23 10:00 AM  Result Value Ref Range   Glucose, Bld 101 (H) 65 - 99 mg/dL    Comment: .            Fasting reference interval . For someone without known  diabetes, a glucose value between 100 and 125 mg/dL is consistent with prediabetes and should be confirmed with a follow-up test. .    BUN 24 7 - 25 mg/dL   Creat 8.69 (H) 9.29 - 1.28 mg/dL   eGFR 58 (L) > OR = 60 mL/min/1.9m2   BUN/Creatinine Ratio 18 6 - 22 (calc)   Sodium 134 (L) 135 - 146 mmol/L   Potassium 4.5 3.5 - 5.3 mmol/L   Chloride 98 98 - 110 mmol/L   CO2 28 20 - 32 mmol/L   Calcium  9.9 8.6 - 10.3 mg/dL   Total Protein 7.7 6.1 - 8.1 g/dL   Albumin  4.3 3.6 - 5.1 g/dL   Globulin 3.4 1.9 - 3.7 g/dL (calc)   AG Ratio 1.3 1.0 - 2.5 (calc)   Total Bilirubin 0.6 0.2 - 1.2 mg/dL   Alkaline phosphatase (APISO) 91 35 - 144 U/L   AST 30 10 - 35 U/L   ALT 21 9 - 46 U/L  Lipid panel  Status: Abnormal   Collection Time: 10/27/23 10:00 AM  Result Value Ref Range   Cholesterol 202 (H) <200 mg/dL   HDL 47 > OR = 40 mg/dL   Triglycerides 896 <849 mg/dL   LDL Cholesterol (Calc) 134 (H) mg/dL (calc)    Comment: Reference range: <100 . Desirable range <100 mg/dL for primary prevention;   <70 mg/dL for patients with CHD or diabetic patients  with > or = 2 CHD risk factors. SABRA LDL-C is now calculated using the Martin-Hopkins  calculation, which is a validated novel method providing  better accuracy than the Friedewald equation in the  estimation of LDL-C.  Gladis APPLETHWAITE et al. SANDREA. 7986;689(80): 2061-2068  (http://education.[facility].com/faq/FAQ164)    Total CHOL/HDL Ratio 4.3 <5.0 (calc)   Non-HDL Cholesterol (Calc) 155 (H) <130 mg/dL (calc)    Comment: For patients with diabetes plus 1 major ASCVD risk  factor, treating to a non-HDL-C goal of <100 mg/dL  (LDL-C of <29 mg/dL) is considered a therapeutic  option.   PSA     Status: None   Collection Time: 10/27/23 10:00 AM  Result Value Ref Range   PSA 2.57 < OR = 4.00 ng/mL    Comment: The total PSA value from this assay system is  standardized against the WHO standard. The test  result will be approximately 20%  lower when compared  to the equimolar-standardized total PSA (Beckman  Coulter). Comparison of serial PSA results should be  interpreted with this fact in mind. . This test was performed using the Siemens  chemiluminescent method. Values obtained from  different assay methods cannot be used interchangeably. PSA levels, regardless of value, should not be interpreted as absolute evidence of the presence or absence of disease.   VITAMIN D  25 Hydroxy (Vit-D Deficiency, Fractures)     Status: None   Collection Time: 10/27/23 10:00 AM  Result Value Ref Range   Vit D, 25-Hydroxy 64 30 - 100 ng/mL    Comment: Vitamin D  Status         25-OH Vitamin D : . Deficiency:                    <20 ng/mL Insufficiency:             20 - 29 ng/mL Optimal:                 > or = 30 ng/mL . For 25-OH Vitamin D  testing on patients on  D2-supplementation and patients for whom quantitation  of D2 and D3 fractions is required, the QuestAssureD(TM) 25-OH VIT D, (D2,D3), LC/MS/MS is recommended: order  code 07111 (patients >61yrs). . See Note 1 . Note 1 . For additional information, please refer to  http://education.[facility].com/faq/FAQ199  (This link is being provided for informational/ educational purposes only.)      PHQ2/9:    12/29/2023    8:44 AM 10/27/2023    9:12 AM 07/30/2023    8:15 AM 06/29/2023    8:50 AM 10/23/2022    9:05 AM  Depression screen PHQ 2/9  Decreased Interest 0 0 0 0 0  Down, Depressed, Hopeless 0 0 0 0 0  PHQ - 2 Score 0 0 0 0 0  Altered sleeping   0 0 0  Tired, decreased energy   0 0 0  Change in appetite   0 0 0  Feeling bad or failure about yourself    0 0 0  Trouble concentrating   0 0 0  Moving slowly or fidgety/restless   0 0 0  Suicidal thoughts   0 0 0  PHQ-9 Score   0  0  0   Difficult doing work/chores   Not difficult at all Not difficult at all      Data saved with a previous flowsheet row definition    phq 9 is negative  Fall Risk:     12/29/2023    8:44 AM 10/27/2023    9:12 AM 07/30/2023    8:19 AM 06/29/2023    8:49 AM 12/29/2022    8:26 AM  Fall Risk   Falls in the past year? 0 0 0 0 0  Number falls in past yr: 0 0 0 0 0  Injury with Fall? 0 0  0  0  0   Risk for fall due to : No Fall Risks No Fall Risks No Fall Risks No Fall Risks   Follow up Falls evaluation completed Falls evaluation completed Falls evaluation completed;Falls prevention discussed Falls prevention discussed;Education provided;Falls evaluation completed      Data saved with a previous flowsheet row definition    Assessment & Plan Chronic kidney disease, stage 3a eGFR improved from 45 to 58. Potassium levels stable without medication. No current medications needed as blood pressure and kidney function are stable. - Continue hydration and avoid nephrotoxic medications. - Monitor kidney function and potassium levels regularly.  Morbid obesity BMI 36.36. Discussed weight loss strategies. Encouraged to lose 5 pounds to reduce BMI below 35. - Continue current exercise regimen and increase frequency to 5 days a week. - Incorporate cardio exercises on non-strength training days. - Monitor weight and BMI.  Dyslipidemia Recent LDL increase from 71 to 134. On rosuvastatin . - Continue rosuvastatin . - Recheck cholesterol levels at next visit.  Atherosclerosis of abdominal aorta Managed with rosuvastatin . - Continue rosuvastatin .  Anemia of chronic disease Chronic anemia with hemoglobin at 10.6, consistent with baseline. Likely related to chronic kidney disease. - Continue monitoring hemoglobin levels.  Primary osteoarthritis involving multiple joints Joint pain managed through exercise and dietary changes. Tylenol  used for pain management. - Continue current exercise regimen and dietary modifications.  Benign prostatic hyperplasia with lower urinary tract symptoms Stable PSA levels between 1.59 and 3.4 over the past 5-6 years. No urinary  symptoms reported. - Continue monitoring PSA levels.  History of DVT No current anticoagulation therapy. Previous ultrasound and records reviewed by specialist.  General Health Maintenance Discussed exercise, diet, and hydration. Encouraged active lifestyle and healthy diet. - Continue regular exercise and healthy diet. - Ensure adequate hydration.        [1]  Current Outpatient Medications:    Albuterol -Budesonide (AIRSUPRA ) 90-80 MCG/ACT AERO, Inhale 2 puffs into the lungs 4 (four) times daily as needed., Disp: 10.7 g, Rfl: 2   azelastine  (OPTIVAR ) 0.05 % ophthalmic solution, PLACE 2 DROPS INTO BOTH EYES 2 (TWO) TIMES DAILY., Disp: 18 mL, Rfl: 5   fluticasone  (FLONASE ) 50 MCG/ACT nasal spray, SPRAY 2 SPRAYS INTO EACH NOSTRIL EVERY DAY, Disp: 48 mL, Rfl: 1   Multiple Vitamins-Minerals (MULTIVITAMIN ADULTS 50+ PO), Take 1 tablet by mouth daily. NATURE'S CODE MEN OVER 50 MULTIVITAMIN PACK, Disp: , Rfl:    Polyethyl Glycol-Propyl Glycol 0.4-0.3 % SOLN, Place 1-2 drops into both eyes 3 (three) times daily as needed (for dry/irritated eyes.)., Disp: , Rfl:    rosuvastatin  (CRESTOR ) 40 MG tablet, Take 1 tablet (40 mg total) by mouth daily. In place of Atorvastatin  for cholesterol, Disp: 90 tablet, Rfl:  3   SODIUM FLUORIDE 5000 SENSITIVE 1.1-5 % GEL, as directed., Disp: , Rfl:  [2]  Allergies Allergen Reactions   Almond (Diagnostic) Other (See Comments)    Migraines   Lactose Intolerance (Gi) Other (See Comments)    MIGRAINES   Peanut-Containing Drug Products Other (See Comments)    Migraines   Shellfish Allergy Other (See Comments)    Congestion/breathing problems/migraines.

## 2024-02-11 ENCOUNTER — Other Ambulatory Visit: Payer: Self-pay | Admitting: Family Medicine

## 2024-02-11 DIAGNOSIS — J301 Allergic rhinitis due to pollen: Secondary | ICD-10-CM

## 2024-02-11 DIAGNOSIS — J302 Other seasonal allergic rhinitis: Secondary | ICD-10-CM

## 2024-02-12 NOTE — Telephone Encounter (Signed)
 Requested Prescriptions  Pending Prescriptions Disp Refills   fluticasone  (FLONASE ) 50 MCG/ACT nasal spray [Pharmacy Med Name: FLUTICASONE  PROP 50 MCG SPRAY] 48 mL 0    Sig: SPRAY 2 SPRAYS INTO EACH NOSTRIL EVERY DAY     Ear, Nose, and Throat: Nasal Preparations - Corticosteroids Passed - 02/12/2024  3:35 PM      Passed - Valid encounter within last 12 months    Recent Outpatient Visits           1 month ago Chronic kidney disease, stage 3a [facility])   [facility] [place] Mire, MD   3 months ago Well adult exam   [facility] Glenard Mire, MD   7 months ago Atherosclerosis of abdominal aorta   [facility] [place], Krichna, MD               azelastine  (OPTIVAR ) 0.05 % ophthalmic solution [Pharmacy Med Name: AZELASTINE  HCL 0.05% DROPS] 18 mL 0    Sig: PLACE 2 DROPS INTO BOTH EYES 2 (TWO) TIMES DAILY.     Ophthalmology:  Nolberto Amy - 02/12/2024  3:35 PM      Passed - Valid encounter within last 12 months    Recent Outpatient Visits           1 month ago Chronic kidney disease, stage 3a [facility])   [place] [facility] Glenard Mire, MD   3 months ago Well adult exam   [facility] Glenard Mire, MD   7 months ago Atherosclerosis of abdominal aorta   [facility] [place], Krichna, MD

## 2024-06-28 ENCOUNTER — Ambulatory Visit: Admitting: Family Medicine

## 2024-08-04 ENCOUNTER — Ambulatory Visit

## 2024-10-31 ENCOUNTER — Encounter: Admitting: Family Medicine
